# Patient Record
Sex: Female | Born: 1950 | Race: White | Hispanic: No | Marital: Married | Smoking: Never smoker
Health system: Southern US, Community
[De-identification: ages and names within clinical notes are randomized; demographics above are authoritative.]

## PROBLEM LIST (undated history)

## (undated) DIAGNOSIS — T753XXA Motion sickness, initial encounter: Secondary | ICD-10-CM

## (undated) DIAGNOSIS — H348192 Central retinal vein occlusion, unspecified eye, stable: Secondary | ICD-10-CM

## (undated) DIAGNOSIS — M1990 Unspecified osteoarthritis, unspecified site: Secondary | ICD-10-CM

## (undated) DIAGNOSIS — M75122 Complete rotator cuff tear or rupture of left shoulder, not specified as traumatic: Secondary | ICD-10-CM

## (undated) DIAGNOSIS — M8589 Other specified disorders of bone density and structure, multiple sites: Secondary | ICD-10-CM

## (undated) HISTORY — PX: ABDOMINAL HYSTERECTOMY: SHX81

## (undated) HISTORY — PX: KNEE SURGERY: SHX244

## (undated) HISTORY — PX: COLONOSCOPY: SHX174

## (undated) HISTORY — PX: ESOPHAGOGASTRODUODENOSCOPY: SHX1529

## (undated) HISTORY — PX: TOTAL VAGINAL HYSTERECTOMY: SHX2548

## (undated) HISTORY — PX: OTHER SURGICAL HISTORY: SHX169

## (undated) HISTORY — PX: EYE SURGERY: SHX253

---

## 2004-04-09 ENCOUNTER — Ambulatory Visit: Payer: Self-pay | Admitting: Unknown Physician Specialty

## 2004-11-15 ENCOUNTER — Ambulatory Visit: Payer: Self-pay | Admitting: Otolaryngology

## 2004-11-20 ENCOUNTER — Ambulatory Visit: Payer: Self-pay | Admitting: Otolaryngology

## 2005-04-16 ENCOUNTER — Ambulatory Visit: Payer: Self-pay | Admitting: Unknown Physician Specialty

## 2005-04-18 ENCOUNTER — Ambulatory Visit: Payer: Self-pay | Admitting: Unknown Physician Specialty

## 2005-07-24 ENCOUNTER — Ambulatory Visit: Payer: Self-pay | Admitting: Unknown Physician Specialty

## 2005-08-15 ENCOUNTER — Ambulatory Visit: Payer: Self-pay | Admitting: General Practice

## 2005-09-03 ENCOUNTER — Ambulatory Visit: Payer: Self-pay | Admitting: General Practice

## 2006-04-24 ENCOUNTER — Ambulatory Visit: Payer: Self-pay

## 2006-11-27 ENCOUNTER — Encounter: Payer: Self-pay | Admitting: General Practice

## 2006-12-05 ENCOUNTER — Encounter: Payer: Self-pay | Admitting: General Practice

## 2007-04-01 ENCOUNTER — Ambulatory Visit: Payer: Self-pay

## 2007-04-22 ENCOUNTER — Ambulatory Visit: Payer: Self-pay | Admitting: Anesthesiology

## 2007-04-27 ENCOUNTER — Ambulatory Visit: Payer: Self-pay | Admitting: Anesthesiology

## 2007-05-26 ENCOUNTER — Ambulatory Visit: Payer: Self-pay | Admitting: Anesthesiology

## 2007-06-03 ENCOUNTER — Ambulatory Visit: Payer: Self-pay | Admitting: Unknown Physician Specialty

## 2007-06-10 ENCOUNTER — Ambulatory Visit: Payer: Self-pay | Admitting: Unknown Physician Specialty

## 2007-07-08 ENCOUNTER — Inpatient Hospital Stay: Payer: Self-pay | Admitting: Surgery

## 2008-02-29 ENCOUNTER — Ambulatory Visit: Payer: Self-pay | Admitting: Unknown Physician Specialty

## 2008-03-01 ENCOUNTER — Ambulatory Visit: Payer: Self-pay | Admitting: Unknown Physician Specialty

## 2008-03-12 ENCOUNTER — Ambulatory Visit: Payer: Self-pay

## 2008-03-25 ENCOUNTER — Encounter: Payer: Self-pay | Admitting: General Practice

## 2008-04-05 ENCOUNTER — Encounter: Payer: Self-pay | Admitting: General Practice

## 2008-04-05 ENCOUNTER — Ambulatory Visit: Payer: Self-pay | Admitting: Unknown Physician Specialty

## 2008-04-25 ENCOUNTER — Ambulatory Visit: Payer: Self-pay | Admitting: General Practice

## 2008-04-27 ENCOUNTER — Ambulatory Visit: Payer: Self-pay | Admitting: Unknown Physician Specialty

## 2008-04-28 ENCOUNTER — Ambulatory Visit: Payer: Self-pay

## 2008-05-04 ENCOUNTER — Ambulatory Visit: Payer: Self-pay | Admitting: General Practice

## 2008-05-06 ENCOUNTER — Encounter: Payer: Self-pay | Admitting: General Practice

## 2008-11-30 ENCOUNTER — Ambulatory Visit: Payer: Self-pay | Admitting: Unknown Physician Specialty

## 2008-12-08 DIAGNOSIS — C4491 Basal cell carcinoma of skin, unspecified: Secondary | ICD-10-CM

## 2008-12-08 HISTORY — DX: Basal cell carcinoma of skin, unspecified: C44.91

## 2009-04-06 ENCOUNTER — Other Ambulatory Visit: Payer: Self-pay | Admitting: Unknown Physician Specialty

## 2009-04-09 ENCOUNTER — Ambulatory Visit: Payer: Self-pay

## 2009-05-02 ENCOUNTER — Ambulatory Visit: Payer: Self-pay

## 2009-06-28 ENCOUNTER — Ambulatory Visit: Payer: Self-pay | Admitting: Unknown Physician Specialty

## 2009-07-13 ENCOUNTER — Other Ambulatory Visit: Payer: Self-pay | Admitting: Unknown Physician Specialty

## 2009-10-18 ENCOUNTER — Ambulatory Visit: Payer: Self-pay | Admitting: Unknown Physician Specialty

## 2010-05-02 DIAGNOSIS — Z86018 Personal history of other benign neoplasm: Secondary | ICD-10-CM

## 2010-05-02 HISTORY — DX: Personal history of other benign neoplasm: Z86.018

## 2010-05-08 ENCOUNTER — Ambulatory Visit: Payer: Self-pay

## 2010-08-20 ENCOUNTER — Other Ambulatory Visit: Payer: Self-pay | Admitting: General Practice

## 2010-10-22 ENCOUNTER — Other Ambulatory Visit: Payer: Self-pay

## 2010-12-07 ENCOUNTER — Other Ambulatory Visit: Payer: Self-pay | Admitting: Internal Medicine

## 2010-12-13 ENCOUNTER — Ambulatory Visit: Payer: Self-pay | Admitting: Internal Medicine

## 2011-01-09 ENCOUNTER — Other Ambulatory Visit: Payer: Self-pay | Admitting: Physician Assistant

## 2011-01-21 ENCOUNTER — Other Ambulatory Visit: Payer: Self-pay

## 2011-01-23 ENCOUNTER — Ambulatory Visit: Payer: Self-pay | Admitting: Unknown Physician Specialty

## 2011-03-07 ENCOUNTER — Ambulatory Visit: Payer: Self-pay | Admitting: Unknown Physician Specialty

## 2011-04-18 ENCOUNTER — Ambulatory Visit: Payer: Self-pay | Admitting: Pain Medicine

## 2011-04-22 ENCOUNTER — Ambulatory Visit: Payer: Self-pay | Admitting: Pain Medicine

## 2011-04-24 ENCOUNTER — Ambulatory Visit: Payer: Self-pay | Admitting: Pain Medicine

## 2011-04-25 ENCOUNTER — Ambulatory Visit: Payer: Self-pay | Admitting: Pain Medicine

## 2011-05-08 ENCOUNTER — Ambulatory Visit: Payer: Self-pay | Admitting: Pain Medicine

## 2011-06-07 ENCOUNTER — Other Ambulatory Visit: Payer: Self-pay | Admitting: Internal Medicine

## 2011-06-07 LAB — CBC WITH DIFFERENTIAL/PLATELET
Basophil %: 0.5 %
Eosinophil #: 0.1 10*3/uL (ref 0.0–0.7)
Eosinophil %: 1.4 %
HCT: 40 % (ref 35.0–47.0)
HGB: 13.9 g/dL (ref 12.0–16.0)
Lymphocyte %: 23.2 %
MCH: 33.1 pg (ref 26.0–34.0)
MCHC: 34.7 g/dL (ref 32.0–36.0)
MCV: 95 fL (ref 80–100)
Monocyte #: 0.4 10*3/uL (ref 0.0–0.7)
Neutrophil #: 4.5 10*3/uL (ref 1.4–6.5)
Neutrophil %: 68.3 %

## 2011-06-07 LAB — T4, FREE: Free Thyroxine: 0.94 ng/dL (ref 0.76–1.46)

## 2011-06-19 ENCOUNTER — Ambulatory Visit: Payer: Self-pay | Admitting: Internal Medicine

## 2011-06-25 ENCOUNTER — Ambulatory Visit: Payer: Self-pay

## 2011-06-26 ENCOUNTER — Ambulatory Visit: Payer: Self-pay | Admitting: Internal Medicine

## 2011-07-29 ENCOUNTER — Ambulatory Visit: Payer: Self-pay | Admitting: Pain Medicine

## 2011-12-12 ENCOUNTER — Ambulatory Visit: Payer: Self-pay | Admitting: General Practice

## 2011-12-25 ENCOUNTER — Other Ambulatory Visit: Payer: Self-pay

## 2011-12-25 LAB — T4, FREE: Free Thyroxine: 0.81 ng/dL (ref 0.76–1.46)

## 2012-04-08 ENCOUNTER — Ambulatory Visit: Payer: Self-pay | Admitting: Ophthalmology

## 2012-07-15 ENCOUNTER — Ambulatory Visit: Payer: Self-pay | Admitting: Internal Medicine

## 2012-10-27 ENCOUNTER — Ambulatory Visit: Payer: Self-pay | Admitting: Internal Medicine

## 2013-07-14 ENCOUNTER — Encounter: Payer: Self-pay | Admitting: Unknown Physician Specialty

## 2013-08-04 ENCOUNTER — Encounter: Payer: Self-pay | Admitting: Unknown Physician Specialty

## 2013-09-20 ENCOUNTER — Ambulatory Visit: Payer: Self-pay | Admitting: Unknown Physician Specialty

## 2013-09-29 DIAGNOSIS — M75100 Unspecified rotator cuff tear or rupture of unspecified shoulder, not specified as traumatic: Secondary | ICD-10-CM | POA: Insufficient documentation

## 2013-10-14 ENCOUNTER — Ambulatory Visit: Payer: Self-pay | Admitting: Internal Medicine

## 2014-02-20 ENCOUNTER — Ambulatory Visit: Payer: Self-pay

## 2014-03-23 ENCOUNTER — Ambulatory Visit: Payer: Self-pay | Admitting: Ophthalmology

## 2014-05-19 ENCOUNTER — Ambulatory Visit: Payer: Self-pay | Admitting: Unknown Physician Specialty

## 2014-09-21 ENCOUNTER — Other Ambulatory Visit: Payer: Self-pay | Admitting: Family

## 2014-09-21 ENCOUNTER — Ambulatory Visit: Admission: RE | Admit: 2014-09-21 | Payer: PRIVATE HEALTH INSURANCE | Source: Ambulatory Visit | Admitting: *Deleted

## 2014-09-21 ENCOUNTER — Ambulatory Visit
Admission: RE | Admit: 2014-09-21 | Discharge: 2014-09-21 | Disposition: A | Discharge status: Home / Self Care | Payer: PRIVATE HEALTH INSURANCE | Source: Ambulatory Visit | Attending: Family | Admitting: Family

## 2014-09-21 DIAGNOSIS — Z87828 Personal history of other (healed) physical injury and trauma: Secondary | ICD-10-CM | POA: Diagnosis not present

## 2014-09-21 DIAGNOSIS — M25431 Effusion, right wrist: Secondary | ICD-10-CM | POA: Diagnosis present

## 2014-09-21 DIAGNOSIS — M25531 Pain in right wrist: Secondary | ICD-10-CM | POA: Diagnosis present

## 2014-09-21 DIAGNOSIS — M19041 Primary osteoarthritis, right hand: Secondary | ICD-10-CM | POA: Insufficient documentation

## 2014-09-21 DIAGNOSIS — R609 Edema, unspecified: Secondary | ICD-10-CM

## 2014-09-21 DIAGNOSIS — M79644 Pain in right finger(s): Secondary | ICD-10-CM | POA: Diagnosis present

## 2014-09-21 DIAGNOSIS — R52 Pain, unspecified: Secondary | ICD-10-CM

## 2014-10-26 ENCOUNTER — Other Ambulatory Visit: Payer: Self-pay | Admitting: Internal Medicine

## 2014-10-26 DIAGNOSIS — Z1231 Encounter for screening mammogram for malignant neoplasm of breast: Secondary | ICD-10-CM

## 2014-10-27 ENCOUNTER — Other Ambulatory Visit: Payer: Self-pay | Admitting: Internal Medicine

## 2014-10-27 DIAGNOSIS — M810 Age-related osteoporosis without current pathological fracture: Secondary | ICD-10-CM

## 2014-11-15 ENCOUNTER — Ambulatory Visit
Admission: RE | Admit: 2014-11-15 | Discharge: 2014-11-15 | Disposition: A | Discharge status: Home / Self Care | Payer: 59 | Source: Ambulatory Visit | Attending: Internal Medicine | Admitting: Internal Medicine

## 2014-11-15 DIAGNOSIS — Z1231 Encounter for screening mammogram for malignant neoplasm of breast: Secondary | ICD-10-CM | POA: Diagnosis not present

## 2014-11-16 ENCOUNTER — Ambulatory Visit
Admission: RE | Admit: 2014-11-16 | Discharge: 2014-11-16 | Disposition: A | Discharge status: Home / Self Care | Payer: 59 | Source: Ambulatory Visit | Attending: Internal Medicine | Admitting: Internal Medicine

## 2014-11-16 DIAGNOSIS — Z13820 Encounter for screening for osteoporosis: Secondary | ICD-10-CM | POA: Diagnosis not present

## 2014-11-16 DIAGNOSIS — M8580 Other specified disorders of bone density and structure, unspecified site: Secondary | ICD-10-CM | POA: Insufficient documentation

## 2014-11-16 DIAGNOSIS — M810 Age-related osteoporosis without current pathological fracture: Secondary | ICD-10-CM

## 2014-11-18 DIAGNOSIS — B9689 Other specified bacterial agents as the cause of diseases classified elsewhere: Secondary | ICD-10-CM | POA: Insufficient documentation

## 2014-11-18 DIAGNOSIS — N760 Acute vaginitis: Secondary | ICD-10-CM

## 2015-02-09 ENCOUNTER — Encounter: Payer: Self-pay | Admitting: Physician Assistant

## 2015-02-09 ENCOUNTER — Ambulatory Visit: Payer: Self-pay | Admitting: Physician Assistant

## 2015-02-09 ENCOUNTER — Other Ambulatory Visit: Payer: Self-pay

## 2015-02-09 VITALS — BP 130/92 | Temp 97.8°F | Ht 61.0 in | Wt 143.0 lb

## 2015-02-09 DIAGNOSIS — J302 Other seasonal allergic rhinitis: Secondary | ICD-10-CM

## 2015-02-09 DIAGNOSIS — E039 Hypothyroidism, unspecified: Secondary | ICD-10-CM | POA: Insufficient documentation

## 2015-02-09 DIAGNOSIS — M810 Age-related osteoporosis without current pathological fracture: Secondary | ICD-10-CM | POA: Insufficient documentation

## 2015-02-09 DIAGNOSIS — T7840XA Allergy, unspecified, initial encounter: Secondary | ICD-10-CM | POA: Insufficient documentation

## 2015-02-09 DIAGNOSIS — Z9109 Other allergy status, other than to drugs and biological substances: Secondary | ICD-10-CM

## 2015-02-09 MED ORDER — ALBUTEROL SULFATE HFA 108 (90 BASE) MCG/ACT IN AERS: 2.0000 | INHALATION_SPRAY | Freq: Four times a day (QID) | RESPIRATORY_TRACT | Status: DC | PRN

## 2015-02-09 MED ORDER — LEVOCETIRIZINE DIHYDROCHLORIDE 5 MG PO TABS: 5.0000 mg | ORAL_TABLET | Freq: Every evening | ORAL | Status: DC

## 2015-02-09 MED ORDER — PREDNISONE 10 MG PO TABS
30.0000 mg | ORAL_TABLET | Freq: Every day | ORAL | Status: DC
Start: 2015-02-09 — End: 2015-05-22

## 2015-02-09 NOTE — Addendum Note (Signed)
Addended by: Versie Starks on: 02/09/2015 01:48 PM   Modules accepted: Orders

## 2015-02-09 NOTE — Progress Notes (Signed)
S:  Was exposed to a fragrance at work yesterday, since then has a lot of swelling in face, burning in nose, hoarse voice, no dif breathing, no cp/sob; using allergy meds as rx'd  O: vitals wnl, nad, tms clear, nasal mucosa inflamed, throat wnl, voice hoarse, neck supple no lymph, lungs c t a, cv rrr  A: allergic reaction to fragrance  P: prednisone 30mg  qd x 3d, xyzal 5mg 

## 2015-03-02 ENCOUNTER — Ambulatory Visit: Payer: Self-pay | Admitting: Physician Assistant

## 2015-03-02 VITALS — BP 130/90 | HR 76 | Temp 97.9°F

## 2015-03-02 DIAGNOSIS — J018 Other acute sinusitis: Secondary | ICD-10-CM

## 2015-03-02 MED ORDER — METHYLPREDNISOLONE 4 MG PO TBPK: ORAL_TABLET | ORAL | Status: DC

## 2015-03-02 MED ORDER — CEFDINIR 300 MG PO CAPS: 300.0000 mg | ORAL_CAPSULE | Freq: Two times a day (BID) | ORAL | Status: DC

## 2015-03-02 NOTE — Progress Notes (Signed)
S: C/o runny nose and congestion for 3 days, pressure up middle,  no fever, chills, cp/sob, v/d; mucus is green and thick, c/o of facial and dental pain. [place], worried about pressure while going up in elevation  Using otc meds:   O: PE: vitals wnl, nad,  perrl eomi, normocephalic, tms dull, nasal mucosa red and swollen, throat injected, neck supple no lymph, lungs c t a, cv rrr, neuro intact  A:  Acute/chronic sinusitis   P: omnicef 300mg  bid x 10d, medrol dose pack,  drink fluids, continue regular meds , use otc meds of choice, return if not improving in 5 days, return earlier if worsening

## 2015-03-28 ENCOUNTER — Ambulatory Visit: Payer: Self-pay | Admitting: Physician Assistant

## 2015-03-28 ENCOUNTER — Encounter: Payer: Self-pay | Admitting: Physician Assistant

## 2015-03-28 VITALS — BP 132/94 | HR 72 | Temp 97.6°F

## 2015-03-28 DIAGNOSIS — J00 Acute nasopharyngitis [common cold]: Secondary | ICD-10-CM

## 2015-03-28 DIAGNOSIS — H6982 Other specified disorders of Eustachian tube, left ear: Secondary | ICD-10-CM

## 2015-03-28 NOTE — Progress Notes (Signed)
S:  C/o ears popping and being stopped up, no drainage from ears, no fever/chills, no cough or congestion, some sinus pressure, remainder ros neg Using otc meds without relief  O:  Vitals wnl, nad, tms dull b/l, nasal mucosa swollen, throat wnl, neck supple no lymph, lungs c t a, cv rrr, neuro intact  A: acute eustachean tube dysfunction, common cold  P: flonase, sudafed, allegra, reassurance, return if not improving in 3 to 5 days, return earlier if worsening

## 2015-04-10 ENCOUNTER — Encounter: Payer: Self-pay | Admitting: Physician Assistant

## 2015-04-10 ENCOUNTER — Ambulatory Visit: Payer: Self-pay | Admitting: Physician Assistant

## 2015-04-10 VITALS — BP 120/88 | HR 88 | Temp 97.7°F

## 2015-04-10 DIAGNOSIS — R300 Dysuria: Secondary | ICD-10-CM

## 2015-04-10 DIAGNOSIS — N390 Urinary tract infection, site not specified: Secondary | ICD-10-CM

## 2015-04-10 LAB — POCT URINALYSIS DIPSTICK
BILIRUBIN UA: NEGATIVE
Blood, UA: NEGATIVE
GLUCOSE UA: NEGATIVE
KETONES UA: NEGATIVE
Nitrite, UA: NEGATIVE
Protein, UA: NEGATIVE
SPEC GRAV UA: 1.025
Urobilinogen, UA: 0.2
pH, UA: 5.5

## 2015-04-10 MED ORDER — CIPROFLOXACIN HCL 250 MG PO TABS: 250.0000 mg | ORAL_TABLET | Freq: Two times a day (BID) | ORAL | Status: DC

## 2015-04-10 NOTE — Progress Notes (Signed)
S: C/o runny nose and congestion for 3 days, no fever, chills, cp/sob, v/d; mucus was green this am but clear throughout the day, cough is sporadic, also burning with urination, no blood noted  O: PE: vitals wnl, nad,  perrl eomi, normocephalic, tms dull, nasal mucosa red and swollen, throat injected, neck supple no lymph, lungs c t a, cv rrr, neuro intact, ua +1 leuks  A:  Acute uri, acute uti  P: cipro 250mg  bid x 7d; drink fluids, continue regular meds , use otc meds of choice, return if not improving in 5 days, return earlier if worsening

## 2015-04-24 ENCOUNTER — Encounter: Payer: Self-pay | Admitting: Physician Assistant

## 2015-04-24 ENCOUNTER — Ambulatory Visit: Payer: Self-pay | Admitting: Physician Assistant

## 2015-04-24 VITALS — BP 125/85 | HR 106 | Temp 97.6°F

## 2015-04-24 DIAGNOSIS — M25562 Pain in left knee: Secondary | ICD-10-CM

## 2015-04-24 NOTE — Progress Notes (Signed)
S: c/o left knee pain, increased pain with extension, sx for 2 weeks, no fever/chills, no redness or swelling, also nasal passage is burning, used neti pot and it burned a lot, no drainage or sinus congestion  O: vitals wnl, nad, tms clear, nasal mucosa boggy and irritated, throat wnl, neck supple no lymph, lungs c t a, cv rrr, left knee tender along lcl, full rom, no swelling noted, n/v intact  A: left knee pain, nasal mucosal irritation  P: otc nsaids, ace wrap given, f/u with ortho for knee pain, vaseline for nasal irritation

## 2015-04-26 NOTE — Progress Notes (Signed)
Contacted [employer] spoke with Finger. Appt scheduled w/Wolfe P.A  On 05/19/2015 via patient.

## 2015-05-12 DIAGNOSIS — H26491 Other secondary cataract, right eye: Secondary | ICD-10-CM | POA: Diagnosis not present

## 2015-05-19 DIAGNOSIS — S83222A Peripheral tear of medial meniscus, current injury, left knee, initial encounter: Secondary | ICD-10-CM | POA: Diagnosis not present

## 2015-05-19 DIAGNOSIS — M25562 Pain in left knee: Secondary | ICD-10-CM | POA: Diagnosis not present

## 2015-05-22 ENCOUNTER — Encounter: Payer: Self-pay | Admitting: Physician Assistant

## 2015-05-22 ENCOUNTER — Ambulatory Visit: Payer: Self-pay | Admitting: Physician Assistant

## 2015-05-22 VITALS — BP 140/110 | HR 80 | Temp 97.7°F

## 2015-05-22 DIAGNOSIS — T50905A Adverse effect of unspecified drugs, medicaments and biological substances, initial encounter: Secondary | ICD-10-CM

## 2015-05-22 NOTE — Progress Notes (Signed)
S: c/o cheeks being red and a little puffy, sx started yesterday, states had a steroid injection in her knee on Friday (3 days ago) ; no fever/chills/cp/sob  O: vitals w elevated bp, ENT wnl, neck supple no lymph, lungs c t a, cv rrr, skin, unable to see redness due to makeup  A: rash secondary to steroid injection  P: reassurance

## 2015-06-02 ENCOUNTER — Encounter: Payer: Self-pay | Admitting: Physician Assistant

## 2015-06-02 ENCOUNTER — Ambulatory Visit: Payer: Self-pay | Admitting: Physician Assistant

## 2015-06-02 VITALS — BP 160/90 | HR 104 | Temp 98.0°F

## 2015-06-02 DIAGNOSIS — J00 Acute nasopharyngitis [common cold]: Secondary | ICD-10-CM

## 2015-06-02 NOTE — Progress Notes (Signed)
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

## 2015-06-08 DIAGNOSIS — J019 Acute sinusitis, unspecified: Secondary | ICD-10-CM | POA: Diagnosis not present

## 2015-07-18 ENCOUNTER — Ambulatory Visit: Payer: Self-pay | Admitting: Physician Assistant

## 2015-07-18 ENCOUNTER — Encounter: Payer: Self-pay | Admitting: Physician Assistant

## 2015-07-18 VITALS — BP 120/90 | HR 80 | Temp 99.0°F

## 2015-07-18 DIAGNOSIS — R6883 Chills (without fever): Secondary | ICD-10-CM

## 2015-07-18 LAB — POCT URINALYSIS DIPSTICK
Bilirubin, UA: NEGATIVE
Blood, UA: NEGATIVE
Glucose, UA: NEGATIVE
Ketones, UA: NEGATIVE
LEUKOCYTES UA: NEGATIVE
NITRITE UA: NEGATIVE
PH UA: 5.5
PROTEIN UA: NEGATIVE
Spec Grav, UA: 1.01
Urobilinogen, UA: 0.2

## 2015-07-18 LAB — POCT INFLUENZA A/B
INFLUENZA B, POC: NEGATIVE
Influenza A, POC: NEGATIVE

## 2015-07-18 NOTE — Progress Notes (Signed)
S: c/o sore throat and fever, no cough or congestion, some ear pain, denies cp/sob/abd pain or dysuria; states her husband has been sick coughing for over a week, just doesn't feel right  O: vitals with slightly elevated temp at 99.0; tms dull, nasal mucosa wnl, throat wnl, neck supple no lymph, lungs c t a, cv rrr, ua neg, flu swab neg  A: viral uri  P: reassurance, recheck if worsening

## 2015-07-25 DIAGNOSIS — L820 Inflamed seborrheic keratosis: Secondary | ICD-10-CM | POA: Diagnosis not present

## 2015-07-25 DIAGNOSIS — L578 Other skin changes due to chronic exposure to nonionizing radiation: Secondary | ICD-10-CM | POA: Diagnosis not present

## 2015-07-25 DIAGNOSIS — L821 Other seborrheic keratosis: Secondary | ICD-10-CM | POA: Diagnosis not present

## 2015-07-27 ENCOUNTER — Encounter: Payer: Self-pay | Admitting: Physician Assistant

## 2015-07-27 ENCOUNTER — Ambulatory Visit: Payer: Self-pay | Admitting: Physician Assistant

## 2015-07-27 VITALS — BP 140/88 | HR 100 | Temp 99.1°F

## 2015-07-27 DIAGNOSIS — B349 Viral infection, unspecified: Secondary | ICD-10-CM

## 2015-07-27 DIAGNOSIS — R509 Fever, unspecified: Secondary | ICD-10-CM

## 2015-07-27 LAB — POCT INFLUENZA A/B
INFLUENZA A, POC: NEGATIVE
Influenza B, POC: NEGATIVE

## 2015-07-27 MED ORDER — OSELTAMIVIR PHOSPHATE 75 MG PO CAPS: 75.0000 mg | ORAL_CAPSULE | Freq: Two times a day (BID) | ORAL | Status: AC

## 2015-07-27 NOTE — Progress Notes (Signed)
S: C/o runny nose and congestion with dry cough for 2 days, sore throat and headache, body aches;  + fever, chills, denies cp/sob, v/d;    Using otc meds: none  O: PE: vitals w low grade temp, nad,  perrl eomi, normocephalic, tms dull, nasal mucosa red and swollen, throat injected, neck supple no lymph, lungs c t a, cv rrr, neuro intact, flu swab neg  A:  Acute flu like illness   P: tamiflu 75mg  bid x 5d, pt exhibits flu sx with neg flu swab; drink fluids, continue regular meds , use otc meds of choice, return if not improving in 5 days, return earlier if worsening

## 2015-08-08 ENCOUNTER — Telehealth: Payer: Self-pay | Admitting: Physician Assistant

## 2015-08-08 NOTE — Telephone Encounter (Signed)
Contacted patient informed her per Manuela Schwartz needs to be seen appt. Schedule on 08/09/2015

## 2015-08-08 NOTE — Telephone Encounter (Signed)
Tell Torin to come to clinic and let me reassess her

## 2015-08-09 ENCOUNTER — Ambulatory Visit: Payer: Self-pay | Admitting: Physician Assistant

## 2015-08-09 ENCOUNTER — Encounter: Payer: Self-pay | Admitting: Physician Assistant

## 2015-08-09 VITALS — BP 130/100 | HR 84 | Temp 97.9°F

## 2015-08-09 DIAGNOSIS — Z299 Encounter for prophylactic measures, unspecified: Secondary | ICD-10-CM

## 2015-08-09 DIAGNOSIS — R300 Dysuria: Secondary | ICD-10-CM

## 2015-08-09 DIAGNOSIS — J329 Chronic sinusitis, unspecified: Secondary | ICD-10-CM

## 2015-08-09 LAB — POCT URINALYSIS DIPSTICK
Bilirubin, UA: NEGATIVE
Glucose, UA: NEGATIVE
KETONES UA: NEGATIVE
Leukocytes, UA: NEGATIVE
Nitrite, UA: NEGATIVE
PH UA: 5.5
PROTEIN UA: NEGATIVE
RBC UA: NEGATIVE
SPEC GRAV UA: 1.01
UROBILINOGEN UA: 0.2

## 2015-08-09 MED ORDER — LEVOFLOXACIN 500 MG PO TABS: 500.0000 mg | ORAL_TABLET | Freq: Every day | ORAL | Status: DC

## 2015-08-09 MED ORDER — FLUCONAZOLE 150 MG PO TABS
ORAL_TABLET | ORAL | Status: DC
Start: 2015-08-09 — End: 2015-08-30

## 2015-08-09 NOTE — Progress Notes (Signed)
S/ seen for flu like illness 2 weeks ago , now with green pnd, cough , no fever or chills, local irritation in vaginal area and odor  , wipes burned when she gave specimen today, denies discharge or itching, dysuria or frequency  O/ alert pleasant NAD,  ENT + sinus tenderness, nasal turbinates red,boggy otherwise wnl Neck supple Heart rsr lungs clear PO2 100 % ,  ABD nontender   U/a neg A/ sinusitis   Rx levaquin 500 mg , diflucanHydration encouraged . If local gyn sxs persist will need to follow up with PCP.

## 2015-08-30 ENCOUNTER — Ambulatory Visit: Payer: Self-pay | Admitting: Physician Assistant

## 2015-08-30 ENCOUNTER — Encounter: Payer: Self-pay | Admitting: Physician Assistant

## 2015-08-30 VITALS — BP 140/90 | HR 84 | Temp 98.1°F

## 2015-08-30 DIAGNOSIS — J018 Other acute sinusitis: Secondary | ICD-10-CM

## 2015-08-30 MED ORDER — PREDNISONE 10 MG PO TABS: 30.0000 mg | ORAL_TABLET | Freq: Every day | ORAL | Status: DC

## 2015-08-30 NOTE — Progress Notes (Signed)
S: c/o runny nose, congestion, some sinus pressure, sx for a few days, denies fever/chills/body aches, cough, cp/sob, or v/d  O: vitals wnl, nad, perrl eomi, conjunctiva wnl, tms dull, nasal mucosa swollen and boggy, throat wnl, neck supple no lymph, lungs c t a, cv rrr  A: acute seasonal allergies  P: saline nasal rinse, prednisone 30mg  qd x 3d

## 2015-08-31 DIAGNOSIS — L578 Other skin changes due to chronic exposure to nonionizing radiation: Secondary | ICD-10-CM | POA: Diagnosis not present

## 2015-08-31 DIAGNOSIS — L821 Other seborrheic keratosis: Secondary | ICD-10-CM | POA: Diagnosis not present

## 2015-08-31 DIAGNOSIS — L820 Inflamed seborrheic keratosis: Secondary | ICD-10-CM | POA: Diagnosis not present

## 2015-09-12 ENCOUNTER — Ambulatory Visit: Payer: Self-pay | Admitting: Physician Assistant

## 2015-09-12 ENCOUNTER — Encounter: Payer: Self-pay | Admitting: Physician Assistant

## 2015-09-12 VITALS — BP 120/82 | HR 78 | Temp 97.8°F

## 2015-09-12 DIAGNOSIS — J018 Other acute sinusitis: Secondary | ICD-10-CM

## 2015-09-12 MED ORDER — PREDNISONE 10 MG PO TABS: 30.0000 mg | ORAL_TABLET | Freq: Every day | ORAL | Status: DC

## 2015-09-12 MED ORDER — FLUCONAZOLE 150 MG PO TABS: 150.0000 mg | ORAL_TABLET | Freq: Once | ORAL | Status: DC

## 2015-09-12 MED ORDER — AMOXICILLIN 875 MG PO TABS: 875.0000 mg | ORAL_TABLET | Freq: Two times a day (BID) | ORAL | Status: DC

## 2015-09-12 NOTE — Progress Notes (Signed)
S: C/o runny nose and congestion for 3 days, no fever, chills, cp/sob, v/d; mucus is green and thick,  c/o of facial and dental pain. States coworker is wearing fragrance again and its causing her allergies to act up and inflame her sinuses  O: PE: vitals wnl, nad, perrl eomi, normocephalic, tms dull, nasal mucosa red and swollen, throat injected, neck supple no lymph, lungs c t a, cv rrr, neuro intact  A:  Acute sinusitis   P: amoxil , prednisone 30mg  qd x 3d, diflucan, drink fluids, continue regular meds , use otc meds of choice, return if not improving in 5 days, return earlier if worsening

## 2015-10-02 DIAGNOSIS — R300 Dysuria: Secondary | ICD-10-CM | POA: Diagnosis not present

## 2015-10-02 DIAGNOSIS — H9202 Otalgia, left ear: Secondary | ICD-10-CM | POA: Diagnosis not present

## 2015-10-09 ENCOUNTER — Other Ambulatory Visit: Payer: Self-pay | Admitting: Internal Medicine

## 2015-10-09 DIAGNOSIS — Z1231 Encounter for screening mammogram for malignant neoplasm of breast: Secondary | ICD-10-CM

## 2015-10-18 ENCOUNTER — Ambulatory Visit: Payer: Self-pay | Admitting: Physician Assistant

## 2015-10-18 ENCOUNTER — Encounter: Payer: Self-pay | Admitting: Physician Assistant

## 2015-10-18 VITALS — BP 120/84 | HR 80 | Temp 97.8°F

## 2015-10-18 DIAGNOSIS — J018 Other acute sinusitis: Secondary | ICD-10-CM

## 2015-10-18 DIAGNOSIS — R300 Dysuria: Secondary | ICD-10-CM

## 2015-10-18 LAB — POCT URINALYSIS DIPSTICK
BILIRUBIN UA: NEGATIVE
Blood, UA: NEGATIVE
Glucose, UA: NEGATIVE
Ketones, UA: NEGATIVE
LEUKOCYTES UA: NEGATIVE
NITRITE UA: NEGATIVE
PH UA: 5.5
Protein, UA: NEGATIVE
Spec Grav, UA: 1.025
Urobilinogen, UA: 0.2

## 2015-10-18 MED ORDER — PREDNISONE 10 MG PO TABS
30.0000 mg | ORAL_TABLET | Freq: Every day | ORAL | Status: DC
Start: 2015-10-18 — End: 2015-11-29

## 2015-10-18 NOTE — Progress Notes (Signed)
S: C/o runny nose and congestion for 1 days, no fever, chills, cp/sob, v/d; was exposed to fragrance again today, now r side of face hurts and has some swelling.   Using otc meds:   O: PE: vitals wnl, nad, perrl eomi, normocephalic, tms dull, nasal mucosa red and swollen, throat injected, neck supple no lymph, lungs c t a, cv rrr, neuro intact  A:  Acute allergic sinusitis   P: prednisone 30mg  qd x 3d, drink fluids, continue regular meds , use otc meds of choice, return if not improving in 5 days, return earlier if worsening , if not improving with prednisone will call in an antibiotic

## 2015-10-20 DIAGNOSIS — Z0000 Encounter for general adult medical examination without abnormal findings: Secondary | ICD-10-CM | POA: Diagnosis not present

## 2015-10-20 MED ORDER — FLUCONAZOLE 150 MG PO TABS: 150.0000 mg | ORAL_TABLET | Freq: Once | ORAL | Status: DC

## 2015-10-20 MED ORDER — CEFDINIR 300 MG PO CAPS: 300.0000 mg | ORAL_CAPSULE | Freq: Two times a day (BID) | ORAL | Status: DC

## 2015-10-20 NOTE — Progress Notes (Signed)
Prednisone not helping, called in [facility]

## 2015-10-20 NOTE — Addendum Note (Signed)
Addended by: Versie Starks on: 10/20/2015 08:22 AM   Modules accepted: Orders

## 2015-10-27 DIAGNOSIS — Z1239 Encounter for other screening for malignant neoplasm of breast: Secondary | ICD-10-CM | POA: Diagnosis not present

## 2015-10-27 DIAGNOSIS — Z0001 Encounter for general adult medical examination with abnormal findings: Secondary | ICD-10-CM | POA: Diagnosis not present

## 2015-10-27 DIAGNOSIS — M810 Age-related osteoporosis without current pathological fracture: Secondary | ICD-10-CM | POA: Diagnosis not present

## 2015-10-27 DIAGNOSIS — E034 Atrophy of thyroid (acquired): Secondary | ICD-10-CM | POA: Diagnosis not present

## 2015-10-29 ENCOUNTER — Encounter: Payer: Self-pay | Admitting: Gynecology

## 2015-10-29 ENCOUNTER — Ambulatory Visit
Admission: EM | Admit: 2015-10-29 | Discharge: 2015-10-29 | Disposition: A | Discharge status: Home / Self Care | Payer: 59 | Attending: Family Medicine | Admitting: Family Medicine

## 2015-10-29 DIAGNOSIS — S50362A Insect bite (nonvenomous) of left elbow, initial encounter: Secondary | ICD-10-CM | POA: Diagnosis not present

## 2015-10-29 DIAGNOSIS — L089 Local infection of the skin and subcutaneous tissue, unspecified: Secondary | ICD-10-CM

## 2015-10-29 DIAGNOSIS — W57XXXA Bitten or stung by nonvenomous insect and other nonvenomous arthropods, initial encounter: Principal | ICD-10-CM

## 2015-10-29 MED ORDER — SULFAMETHOXAZOLE-TRIMETHOPRIM 800-160 MG PO TABS: 1.0000 | ORAL_TABLET | Freq: Two times a day (BID) | ORAL | Status: AC

## 2015-10-29 NOTE — Discharge Instructions (Signed)
Take Bactrim antibiotic twice a day as directed. Apply cool compresses to area and take Benadryl as needed for itching. Follow-up with PCP within 3 days if not improving.  Cellulitis Cellulitis is an infection of the skin and the tissue beneath it. The infected area is usually red and tender. Cellulitis occurs most often in the arms and lower legs.  CAUSES  Cellulitis is caused by bacteria that enter the skin through cracks or cuts in the skin. The most common types of bacteria that cause cellulitis are staphylococci and streptococci. SIGNS AND SYMPTOMS   Redness and warmth.  Swelling.  Tenderness or pain.  Fever. DIAGNOSIS  Your health care provider can usually determine what is wrong based on a physical exam. Blood tests may also be done. TREATMENT  Treatment usually involves taking an antibiotic medicine. HOME CARE INSTRUCTIONS   Take your antibiotic medicine as directed by your health care provider. Finish the antibiotic even if you start to feel better.  Keep the infected arm or leg elevated to reduce swelling.  Apply a warm cloth to the affected area up to 4 times per day to relieve pain.  Take medicines only as directed by your health care provider.  Keep all follow-up visits as directed by your health care provider. SEEK MEDICAL CARE IF:   You notice red streaks coming from the infected area.  Your red area gets larger or turns dark in color.  Your bone or joint underneath the infected area becomes painful after the skin has healed.  Your infection returns in the same area or another area.  You notice a swollen bump in the infected area.  You develop new symptoms.  You have a fever. SEEK IMMEDIATE MEDICAL CARE IF:   You feel very sleepy.  You develop vomiting or diarrhea.  You have a general ill feeling (malaise) with muscle aches and pains.   This information is not intended to replace advice given to you by your health care provider. Make sure you discuss  any questions you have with your health care provider.   Document Released: 01/30/2005 Document Revised: 01/11/2015 Document Reviewed: 07/08/2011 Elsevier Interactive Patient Education [employer].

## 2015-10-29 NOTE — ED Provider Notes (Signed)
CSN: [id number]     Arrival date & time 10/29/15  [id number] History   First MD Initiated Contact with Patient 10/29/15 [phone]     Chief Complaint  Patient presents with  . Joint Swelling   (Consider location/radiation/quality/duration/timing/severity/associated sxs/prior Treatment) HPI Comments: Patient presents with left elbow redness, swelling and warmth since yesterday. Was outside mowing lawn 2 days ago- may have been bitten by an insect. Took Benadryl yesterday with minimal relief. Area continues to swell and itch today. No radiation of pain. No other insect bites present.   The history is provided by the patient.    History reviewed. No pertinent past medical history. History reviewed. No pertinent past surgical history. No family history on file. Social History  Substance Use Topics  . Smoking status: Never Smoker   . Smokeless tobacco: None  . Alcohol Use: No   OB History    No data available     Review of Systems  Constitutional: Negative for fever.  Respiratory: Negative for chest tightness and wheezing.   Musculoskeletal: Positive for joint swelling.  Skin: Positive for wound.  No neuro deficits noted.   Allergies  Iodinated diagnostic agents  Home Medications   Prior to Admission medications   Medication Sig Start Date End Date Taking? Authorizing Provider  albuterol (PROVENTIL HFA;VENTOLIN HFA) 108 (90 BASE) MCG/ACT inhaler Inhale 2 puffs into the lungs every 6 (six) hours as needed for wheezing or shortness of breath. 02/09/15  Yes Versie Starks, PA-C  Azelastine HCl 0.15 % SOLN Place into the nose. Reported on 05/22/2015 10/26/14  Yes Historical Provider, MD  fexofenadine (ALLEGRA) 30 MG tablet Take 30 mg by mouth 2 (two) times daily.   Yes Historical Provider, MD  mometasone (NASONEX) 50 MCG/ACT nasal spray Place into the nose. 10/26/14  Yes Historical Provider, MD  Multiple Vitamin (MULTI-VITAMINS) TABS Take by mouth.   Yes Historical Provider, MD  amoxicillin  (AMOXIL) 875 MG tablet Take 1 tablet (875 mg total) by mouth 2 (two) times daily. Patient not taking: Reported on 10/18/2015 09/12/15   Versie Starks, PA-C  levocetirizine (XYZAL) 5 MG tablet Take 1 tablet (5 mg total) by mouth every evening. 02/09/15   Versie Starks, PA-C  predniSONE (DELTASONE) 10 MG tablet Take 3 tablets (30 mg total) by mouth daily with breakfast. 10/18/15   Versie Starks, PA-C  sulfamethoxazole-trimethoprim (BACTRIM DS,SEPTRA DS) 800-160 MG tablet Take 1 tablet by mouth 2 (two) times daily. 10/29/15 11/05/15  Katy Apo, NP  triamcinolone (NASACORT) 55 MCG/ACT AERO nasal inhaler Place into the nose.    Historical Provider, MD   Meds Ordered and Administered this Visit  Medications - No data to display  BP 149/89 mmHg  Pulse 86  Temp(Src) 98.1 F (36.7 C) (Oral)  Resp 12  Ht 5\' 3"  (1.6 m)  Wt 131 lb (59.421 kg)  BMI 23.21 kg/m2  SpO2 99% No data found.   Physical Exam  Constitutional: She is oriented to person, place, and time. She appears well-developed and well-nourished.  Cardiovascular: Normal rate, regular rhythm and normal heart sounds.   Pulmonary/Chest: Effort normal and breath sounds normal.  Musculoskeletal: Normal range of motion.  Neurological: She is alert and oriented to person, place, and time.  Skin: Skin is warm, dry and intact. Lesion noted.     Left elbow punctuated area near lateral epicondyle. Redness and swelling about the size of a quarter present. Slightly tender. No discharge.     ED Course  Procedures (including critical care time)  Labs Review Labs Reviewed - No data to display  Imaging Review No results found.   Visual Acuity Review  Right Eye Distance:   Left Eye Distance:   Bilateral Distance:    Right Eye Near:   Left Eye Near:    Bilateral Near:         MDM   1. Infected insect bite of elbow, left, initial encounter    Recommend cool compresses to area. Start Bactrim DS twice a day as directed. May  continue Benadryl as needed for itching. Follow-up in 2 to 3 days with PCP (patient's [facility]) if not improving.     Katy Apo, NP 10/29/15 [phone]

## 2015-10-29 NOTE — ED Notes (Signed)
Patient c/o left elbow swelling x yesterday. Per patient warm to the touch itching and painful.

## 2015-11-10 ENCOUNTER — Ambulatory Visit: Payer: Self-pay | Admitting: Physician Assistant

## 2015-11-10 ENCOUNTER — Encounter: Payer: Self-pay | Admitting: Physician Assistant

## 2015-11-10 DIAGNOSIS — J209 Acute bronchitis, unspecified: Secondary | ICD-10-CM

## 2015-11-10 MED ORDER — LEVOFLOXACIN 500 MG PO TABS: 500.0000 mg | ORAL_TABLET | Freq: Every day | ORAL | Status: DC

## 2015-11-10 MED ORDER — ALBUTEROL SULFATE HFA 108 (90 BASE) MCG/ACT IN AERS
2.0000 | INHALATION_SPRAY | Freq: Four times a day (QID) | RESPIRATORY_TRACT | Status: DC | PRN
Start: 2015-11-10 — End: 2017-01-27

## 2015-11-10 NOTE — Progress Notes (Signed)
S: C/o cough and congestion with wheezing and chest pain, chest is sore from coughing, +fever, chills. mucus is green, or cough is dry and hacking; keeping pt awake at night;  denies cardiac type chest pain or sob, v/d, abd pain Remainder ros neg  O: vitals wnl, nad, tms clear, throat injected, neck supple no lymph, lungs with wheezing, clears with cough, cv rrr, neuro intact  A:  Acute bronchitis   P:  rx medication: levaquin 500mg  , albuterol inhaler,  use otc meds, tylenol or motrin as needed for fever/chills, return if not better in 3 -5 days, return earlier if worsening

## 2015-11-15 ENCOUNTER — Ambulatory Visit: Payer: Self-pay | Admitting: Physician Assistant

## 2015-11-15 ENCOUNTER — Encounter: Payer: Self-pay | Admitting: Physician Assistant

## 2015-11-15 DIAGNOSIS — J209 Acute bronchitis, unspecified: Secondary | ICD-10-CM

## 2015-11-15 MED ORDER — FLUCONAZOLE 150 MG PO TABS: 150.0000 mg | ORAL_TABLET | Freq: Once | ORAL | Status: DC

## 2015-11-15 MED ORDER — GUAIFENESIN ER 600 MG PO TB12: 600.0000 mg | ORAL_TABLET | Freq: Two times a day (BID) | ORAL | Status: DC

## 2015-11-15 MED ORDER — LEVOFLOXACIN 500 MG PO TABS: 500.0000 mg | ORAL_TABLET | Freq: Every day | ORAL | Status: DC

## 2015-11-15 NOTE — Progress Notes (Signed)
   Subjective:cough/chest congestion    Patient ID: [name], female    DOB: 1950/07/07, 65 y.o.   MRN: [id number]  HPI Follow one week 2nd to URI. States feeling better but continue to have productive greenish cough.States no fever/chill, or N/V/D. Continue to feel fatigue , but again, much better than previous visit.    Review of Systems Hypothyroidism.    Objective:   Physical Exam Appears malaise. HEENT unremarkable. Neck supple, without adenopathy. Lungs with right upper/lower Rales. Heart RRR.       Assessment & Plan:Resolving URI  Will continue [place] for one week. Start Mucinex. Take Diflucan after last dosage of Levaqin. Follow up 5 days.

## 2015-11-16 ENCOUNTER — Ambulatory Visit: Payer: 59

## 2015-11-20 ENCOUNTER — Ambulatory Visit: Payer: Self-pay | Admitting: Physician Assistant

## 2015-11-20 ENCOUNTER — Encounter: Payer: Self-pay | Admitting: Physician Assistant

## 2015-11-20 VITALS — BP 102/80 | HR 80 | Temp 97.8°F

## 2015-11-20 DIAGNOSIS — J209 Acute bronchitis, unspecified: Secondary | ICD-10-CM

## 2015-11-20 NOTE — Progress Notes (Signed)
S: here for recheck.  No fever, feels better.  Still taking atb without any problems  O: TMs dull bilat, throat with mild injection, neck supple without aden, Lungs cl bilat, Heart RRR A: Acute bronchitis improved P: continue atb

## 2015-11-29 ENCOUNTER — Ambulatory Visit: Payer: Self-pay | Admitting: Physician Assistant

## 2015-11-29 ENCOUNTER — Encounter: Payer: Self-pay | Admitting: Physician Assistant

## 2015-11-29 VITALS — BP 120/90 | HR 76 | Temp 97.7°F

## 2015-11-29 DIAGNOSIS — J018 Other acute sinusitis: Secondary | ICD-10-CM

## 2015-11-29 DIAGNOSIS — M549 Dorsalgia, unspecified: Secondary | ICD-10-CM

## 2015-11-29 DIAGNOSIS — W57XXXA Bitten or stung by nonvenomous insect and other nonvenomous arthropods, initial encounter: Secondary | ICD-10-CM

## 2015-11-29 MED ORDER — CYCLOBENZAPRINE HCL 10 MG PO TABS
10.0000 mg | ORAL_TABLET | Freq: Three times a day (TID) | ORAL | 0 refills | Status: DC | PRN
Start: 2015-11-29 — End: 2015-12-26

## 2015-11-29 MED ORDER — DOXYCYCLINE MONOHYDRATE 100 MG PO CAPS: 100.0000 mg | ORAL_CAPSULE | Freq: Two times a day (BID) | ORAL | 0 refills | Status: AC

## 2015-11-29 MED ORDER — PREDNISONE 10 MG PO TABS: 30.0000 mg | ORAL_TABLET | Freq: Every day | ORAL | 0 refills | Status: DC

## 2015-11-29 NOTE — Progress Notes (Signed)
S: c/o tick bite x 2, both were on scalp, pulled ticks off, removed head, no fever/chills or rash, also some low back pain, can feel a knot in her lower back, increased pain with movement, also some sinus swelling, was exposed to fragrance again at work, some mucus production, no cough  O: vitals wnl, nad, skin on scalp with 2 healing bite areas, tms dull, nasal mucosa swollen, r maxillary area tender and swollen, throat wnl, neck supple no lymph, lungs c t a, cv rrr, lower r side of back tender and has spasm leading to si joint, n/v intact, walks without difficulty  A: tick bite, sinusitis, back pain  P: doxy, pred 30mg  qd, flexeril

## 2015-12-04 ENCOUNTER — Ambulatory Visit
Admission: RE | Admit: 2015-12-04 | Discharge: 2015-12-04 | Disposition: A | Discharge status: Home / Self Care | Payer: 59 | Source: Ambulatory Visit | Attending: Internal Medicine | Admitting: Internal Medicine

## 2015-12-04 ENCOUNTER — Encounter: Payer: Self-pay | Admitting: Radiology

## 2015-12-04 ENCOUNTER — Other Ambulatory Visit: Payer: Self-pay | Admitting: Internal Medicine

## 2015-12-04 DIAGNOSIS — Z1231 Encounter for screening mammogram for malignant neoplasm of breast: Secondary | ICD-10-CM

## 2015-12-11 DIAGNOSIS — J45991 Cough variant asthma: Secondary | ICD-10-CM | POA: Diagnosis not present

## 2015-12-11 DIAGNOSIS — R51 Headache: Secondary | ICD-10-CM | POA: Diagnosis not present

## 2015-12-18 ENCOUNTER — Other Ambulatory Visit: Payer: Self-pay | Admitting: Unknown Physician Specialty

## 2015-12-18 ENCOUNTER — Ambulatory Visit
Admission: RE | Admit: 2015-12-18 | Discharge: 2015-12-18 | Disposition: A | Discharge status: Home / Self Care | Payer: 59 | Source: Ambulatory Visit | Attending: Unknown Physician Specialty | Admitting: Unknown Physician Specialty

## 2015-12-18 DIAGNOSIS — R05 Cough: Secondary | ICD-10-CM | POA: Diagnosis not present

## 2015-12-18 DIAGNOSIS — R059 Cough, unspecified: Secondary | ICD-10-CM

## 2015-12-18 DIAGNOSIS — R0602 Shortness of breath: Secondary | ICD-10-CM | POA: Diagnosis not present

## 2015-12-19 DIAGNOSIS — Z1211 Encounter for screening for malignant neoplasm of colon: Secondary | ICD-10-CM | POA: Diagnosis not present

## 2015-12-19 DIAGNOSIS — Z01419 Encounter for gynecological examination (general) (routine) without abnormal findings: Secondary | ICD-10-CM | POA: Diagnosis not present

## 2015-12-19 DIAGNOSIS — Z1389 Encounter for screening for other disorder: Secondary | ICD-10-CM | POA: Diagnosis not present

## 2015-12-26 ENCOUNTER — Ambulatory Visit (INDEPENDENT_AMBULATORY_CARE_PROVIDER_SITE_OTHER): Payer: 59 | Admitting: Internal Medicine

## 2015-12-26 ENCOUNTER — Encounter: Payer: Self-pay | Admitting: Internal Medicine

## 2015-12-26 VITALS — BP 122/78 | HR 98 | Ht 63.0 in | Wt 135.0 lb

## 2015-12-26 DIAGNOSIS — M940 Chondrocostal junction syndrome [Tietze]: Secondary | ICD-10-CM

## 2015-12-26 MED ORDER — IBUPROFEN 800 MG PO TABS: 800.0000 mg | ORAL_TABLET | Freq: Three times a day (TID) | ORAL | 1 refills | Status: DC

## 2015-12-26 NOTE — Patient Instructions (Signed)
Take Motrin 800 mg every 8 hrs with food for 10 days Follow up in 2 weeks   Costochondritis Costochondritis, sometimes called Tietze syndrome, is a swelling and irritation (inflammation) of the tissue (cartilage) that connects your ribs with your breastbone (sternum). It causes pain in the chest and rib area. Costochondritis usually goes away on its own over time. It can take up to 6 weeks or longer to get better, especially if you are unable to limit your activities. CAUSES  Some cases of costochondritis have no known cause. Possible causes include:  Injury (trauma).  Exercise or activity such as lifting.  Severe coughing. SIGNS AND SYMPTOMS  Pain and tenderness in the chest and rib area.  Pain that gets worse when coughing or taking deep breaths.  Pain that gets worse with specific movements. DIAGNOSIS  Your health care provider will do a physical exam and ask about your symptoms. Chest X-rays or other tests may be done to rule out other problems. TREATMENT  Costochondritis usually goes away on its own over time. Your health care provider may prescribe medicine to help relieve pain. HOME CARE INSTRUCTIONS   Avoid exhausting physical activity. Try not to strain your ribs during normal activity. This would include any activities using chest, abdominal, and side muscles, especially if heavy weights are used.  Apply ice to the affected area for the first 2 days after the pain begins.  Put ice in a plastic bag.  Place a towel between your skin and the bag.  Leave the ice on for 20 minutes, 2-3 times a day.  Only take over-the-counter or prescription medicines as directed by your health care provider. SEEK MEDICAL CARE IF:  You have redness or swelling at the rib joints. These are signs of infection.  Your pain does not go away despite rest or medicine. SEEK IMMEDIATE MEDICAL CARE IF:   Your pain increases or you are very uncomfortable.  You have shortness of breath or  difficulty breathing.  You cough up blood.  You have worse chest pains, sweating, or vomiting.  You have a fever or persistent symptoms for more than 2-3 days.  You have a fever and your symptoms suddenly get worse. MAKE SURE YOU:   Understand these instructions.  Will watch your condition.  Will get help right away if you are not doing well or get worse.   This information is not intended to replace advice given to you by your health care provider. Make sure you discuss any questions you have with your health care provider.   Document Released: 01/30/2005 Document Revised: 02/10/2013 Document Reviewed: 11/24/2012 Elsevier Interactive Patient Education [employer].

## 2015-12-26 NOTE — Progress Notes (Signed)
[name]      Date: 12/26/2015,   MRN# [id number] [name] 1951/04/04 Code Status:  Code Status History    This patient does not have a recorded code status. Please follow your organizational policy for patients in this situation.     Hosp day:@LENGTHOFSTAYDAYS @ Referring MD: @ATDPROV @     PCP:      AdmissionWeight: 135 lb (61.2 kg)                 CurrentWeight: 135 lb (61.2 kg) [name] is a 65 y.o. old female seen in [place] for cough at the request of Dr. Tami Ribas.     CHIEF COMPLAINT:   cough   HISTORY OF PRESENT ILLNESS   65 yo pleasant white female seen today for cough for about 6 weeks now. Has otten a little better but still persistant Non productive cough, inability to take deep breaths In the beginning of July, patient had mild productive cough and nasal congestion, with slight SOB Patient DX with Acute Bronchitis on July 7TH, was given oral abx Patient was then dx with acute bronchitis and sinus infection on July 14th and was given second round of abx  And oral prednisone  Patient incidentally found tick on head and she was given Doxy and steroids Other symptoms includes intolerance of fragrances  Patient now with complaints of chest wall pain when she  takes a deep breath, patient has chest wall pain along Left sternal  border when she touches her chest  Patient has some difficulty with moving objects and lifting objects, has chest wall pain associated with increased upper torso movement   There are no signs of infection at this time, patient denies fevers, chills, NVD. Patient exercises daily Patient is non smoker, however was exposed to second hand smoke for 18 years   PAST MEDICAL HISTORY   History reviewed. No pertinent past medical history.  Recent h/o acute bronchtiis   SURGICAL HISTORY   Past Surgical History:  Procedure Laterality Date  . KNEE SURGERY    . TOTAL VAGINAL HYSTERECTOMY         FAMILY HISTORY   Family History  Problem Relation Age of Onset  . Lung cancer Father      SOCIAL HISTORY   Social History  Substance Use Topics  . Smoking status: Never Smoker  . Smokeless tobacco: Never Used  . Alcohol use No     MEDICATIONS    Home Medication:  Current Outpatient Rx  . Order #: [id number] Class: Normal  . Order #: [id number] Class: Historical Med  . Order #: [id number] Class: Historical Med  . Order #: [id number] Class: Historical Med  . Order #: [id number] Class: Normal    Current Medication:  Current Outpatient Prescriptions:  .  albuterol (PROVENTIL HFA;VENTOLIN HFA) 108 (90 Base) MCG/ACT inhaler, Inhale 2 puffs into the lungs every 6 (six) hours as needed for wheezing or shortness of breath., Disp: 1 Inhaler, Rfl: 0 .  fexofenadine (ALLEGRA) 30 MG tablet, Take 30 mg by mouth 2 (two) times daily., Disp: , Rfl:  .  mometasone (NASONEX) 50 MCG/ACT nasal spray, Place into the nose., Disp: , Rfl:  .  Multiple Vitamin (MULTI-VITAMINS) TABS, Take by mouth., Disp: , Rfl:  .  ibuprofen (ADVIL,MOTRIN) 800 MG tablet, Take 1 tablet (800 mg total) by mouth 3 (three) times daily. Please take with food, Disp: 30 tablet, Rfl: 1    ALLERGIES   Iodinated diagnostic agents     REVIEW OF  SYSTEMS   Review of Systems  Constitutional: Negative for chills, diaphoresis, fever, malaise/fatigue and weight loss.  HENT: Negative for congestion and hearing loss.   Eyes: Negative for blurred vision and double vision.  Respiratory: Positive for cough and shortness of breath. Negative for hemoptysis, sputum production and wheezing.   Cardiovascular: Negative for chest pain, palpitations and orthopnea.  Gastrointestinal: Negative for abdominal pain, heartburn, nausea and vomiting.  Genitourinary: Negative for dysuria and urgency.  Musculoskeletal: Negative for back pain, myalgias and neck pain.  Skin: Negative for rash.  Neurological: Negative for dizziness, tingling,  tremors, weakness and headaches.  Endo/Heme/Allergies: Does not bruise/bleed easily.  Psychiatric/Behavioral: Negative for depression, substance abuse and suicidal ideas. The patient is nervous/anxious.   All other systems reviewed and are negative.    VS: BP 122/78 (BP Location: Left Arm, Cuff Size: Normal)   Pulse 98   Ht 5\' 3"  (1.6 m)   Wt 135 lb (61.2 kg)   SpO2 99%   BMI 23.91 kg/m      PHYSICAL EXAM  Physical Exam  Constitutional: She is oriented to person, place, and time. She appears well-developed and well-nourished. No distress.  HENT:  Head: Normocephalic and atraumatic.  Mouth/Throat: No oropharyngeal exudate.  Eyes: EOM are normal. Pupils are equal, round, and reactive to light. No scleral icterus.  Neck: Normal range of motion. Neck supple.  Cardiovascular: Normal rate, regular rhythm and normal heart sounds.   No murmur heard. No pericardial rub noted  Pulmonary/Chest: No stridor. No respiratory distress. She has no wheezes.  Abdominal: Soft. Bowel sounds are normal.  Musculoskeletal: Normal range of motion. She exhibits no edema.  Severe chest wall tenderness upon palpation along left sternal border  Neurological: She is alert and oriented to person, place, and time. No cranial nerve deficit.  Skin: Skin is warm. She is not diaphoretic.  Psychiatric: She has a normal mood and affect.           IMAGING    Dg Chest 2 View  Result Date: 12/18/2015 CLINICAL DATA:  Cough.  Recent treatment for bronchitis. EXAM: CHEST  2 VIEW COMPARISON:  02/29/2008 FINDINGS: Heart and mediastinal contours are within normal limits. No focal opacities or effusions. No acute bony abnormality. IMPRESSION: No active cardiopulmonary disease. Electronically Signed   By: Rolm Baptise M.D.   On: 12/18/2015 13:20   Mm Screening Breast Tomo Bilateral  Result Date: 12/05/2015 CLINICAL DATA:  Screening. EXAM: 2D DIGITAL SCREENING BILATERAL MAMMOGRAM WITH CAD AND ADJUNCT TOMO  COMPARISON:  Previous exam(s). ACR Breast Density Category b: There are scattered areas of fibroglandular density. FINDINGS: There are no findings suspicious for malignancy. Images were processed with CAD. IMPRESSION: No mammographic evidence of malignancy. A result letter of this screening mammogram will be mailed directly to the patient. RECOMMENDATION: Screening mammogram in one year. (Code:SM-B-01Y) BI-RADS CATEGORY  1: Negative. Electronically Signed   By: Abelardo Diesel M.D.   On: 12/05/2015 10:14    CXR Images reviewed 12/26/2015 No acute findings, no effusions or opacities seen   ASSESSMENT/PLAN   65 yo pleasant white female with recent bout of acute bronchitis with signs and symptoms of Costochondritis with inability to take deep breaths which is causing her to cough  1.recommend Motrin 800 mg TID with food for 10 days 2. Follow up in 2 weeks  If symptoms persists will obtain EKG to look for pericarditis and assess with CT Chest.    I have personally obtained a history, examined the patient,  evaluated laboratory and independently reviewed imaging results, formulated the assessment and plan and placed orders.  The Patient requires high complexity decision making for assessment and support, frequent evaluation and titration of therapies, application of advanced monitoring technologies and extensive interpretation of multiple databases.    Patient satisfied with Plan of action and management. All questions answered  Corrin Parker, M.D.  [facility]  Medical Director [place] Director [facility] Cardio-Pulmonary Department

## 2016-01-01 ENCOUNTER — Encounter: Payer: Self-pay | Admitting: Physician Assistant

## 2016-01-01 ENCOUNTER — Ambulatory Visit: Payer: Self-pay | Admitting: Physician Assistant

## 2016-01-01 VITALS — BP 140/100 | HR 107 | Temp 98.4°F

## 2016-01-01 DIAGNOSIS — J0181 Other acute recurrent sinusitis: Secondary | ICD-10-CM

## 2016-01-01 MED ORDER — CEFDINIR 300 MG PO CAPS: 300.0000 mg | ORAL_CAPSULE | Freq: Two times a day (BID) | ORAL | 0 refills | Status: DC

## 2016-01-01 MED ORDER — PREDNISONE 10 MG PO TABS: 30.0000 mg | ORAL_TABLET | Freq: Every day | ORAL | 0 refills | Status: DC

## 2016-01-01 NOTE — Progress Notes (Signed)
S: C/o runny nose and congestion for 3 days, + fever, chills, denies cp/sob, v/d; mucus is green and thick, cough is sporadic, c/o of facial and dental pain.   Using otc meds:   O: PE: vitals wnl, nad, perrl eomi, normocephalic, tms dull, nasal mucosa red and swollen, throat injected, neck supple no lymph, lungs c t a, cv rrr, neuro intact  A:  Acute sinusitis   P: drink fluids, continue regular meds , use otc meds of choice, return if not improving in 5 days, return earlier if worsening , omnicef 300mg  bid x 10d, pred 30mg  qd, nasonex

## 2016-01-09 ENCOUNTER — Ambulatory Visit (INDEPENDENT_AMBULATORY_CARE_PROVIDER_SITE_OTHER): Payer: 59 | Admitting: Internal Medicine

## 2016-01-09 ENCOUNTER — Encounter: Payer: Self-pay | Admitting: Internal Medicine

## 2016-01-09 VITALS — BP 144/90 | HR 97 | Ht 63.0 in | Wt 138.4 lb

## 2016-01-09 DIAGNOSIS — M940 Chondrocostal junction syndrome [Tietze]: Secondary | ICD-10-CM | POA: Diagnosis not present

## 2016-01-09 NOTE — Progress Notes (Signed)
[facility] Consultation      Date: 01/09/2016,   MRN# [id number] ASHONTI JAKUB 1951-04-26 Code Status:  Code Status History    This patient does not have a recorded code status. Please follow your organizational policy for patients in this situation.     Hosp day:@LENGTHOFSTAYDAYS @ Referring MD: @ATDPROV @     PCP:      AdmissionWeight: 138 lb 6.4 oz (62.8 kg)                 CurrentWeight: 138 lb 6.4 oz (62.8 kg) [name] is a 65 y.o. old female seen in consultation for cough at the request of Dr. Tami Ribas.     CHIEF COMPLAINT:   Follow up costachondritis   HISTORY OF PRESENT ILLNESS   All symptoms improved, no chest pain, no cough Motrin has helped a lot, still some chest wall soreness Able to take deep breaths No signs of infection at this time    Current Outpatient Prescriptions:  .  albuterol (PROVENTIL HFA;VENTOLIN HFA) 108 (90 Base) MCG/ACT inhaler, Inhale 2 puffs into the lungs every 6 (six) hours as needed for wheezing or shortness of breath., Disp: 1 Inhaler, Rfl: 0 .  cefdinir (OMNICEF) 300 MG capsule, Take 1 capsule (300 mg total) by mouth 2 (two) times daily., Disp: 20 capsule, Rfl: 0 .  fexofenadine (ALLEGRA) 30 MG tablet, Take 30 mg by mouth 2 (two) times daily., Disp: , Rfl:  .  ibuprofen (ADVIL,MOTRIN) 800 MG tablet, Take 1 tablet (800 mg total) by mouth 3 (three) times daily. Please take with food, Disp: 30 tablet, Rfl: 1 .  mometasone (NASONEX) 50 MCG/ACT nasal spray, Place into the nose., Disp: , Rfl:  .  Multiple Vitamin (MULTI-VITAMINS) TABS, Take by mouth., Disp: , Rfl:     ALLERGIES   Iodinated diagnostic agents     REVIEW OF SYSTEMS   Review of Systems  Constitutional: Negative for chills, diaphoresis, fever, malaise/fatigue and weight loss.  HENT: Negative for congestion and hearing loss.   Respiratory: Negative for cough, hemoptysis, sputum production, shortness of breath and wheezing.   Cardiovascular:  Negative for chest pain, palpitations, orthopnea and leg swelling.  Gastrointestinal: Negative for abdominal pain, heartburn, nausea and vomiting.  Skin: Negative for rash.  Neurological: Negative for weakness and headaches.  Psychiatric/Behavioral: The patient is not nervous/anxious.   All other systems reviewed and are negative.    VS: BP (!) 144/90 (BP Location: Left Arm, Cuff Size: Normal)   Pulse 97   Ht 5\' 3"  (1.6 m)   Wt 138 lb 6.4 oz (62.8 kg)   SpO2 98%   BMI 24.52 kg/m       PHYSICAL EXAM  Physical Exam  Constitutional: She is oriented to person, place, and time. She appears well-developed and well-nourished. No distress.  HENT:  Mouth/Throat: No oropharyngeal exudate.  Eyes: No scleral icterus.  Neck: Neck supple.  Cardiovascular: Normal rate, regular rhythm and normal heart sounds.   No murmur heard. No pericardial rub noted  Pulmonary/Chest: Effort normal and breath sounds normal. No stridor. No respiratory distress. She has no wheezes.  Musculoskeletal: Normal range of motion. She exhibits no edema.  No chest wall tenderness  Neurological: She is alert and oriented to person, place, and time. No cranial nerve deficit.  Skin: Skin is warm. She is not diaphoretic.  Psychiatric: She has a normal mood and affect.       ASSESSMENT/PLAN   65 yo pleasant white female with  resolving Costachondritis  1.recommend Motrin 800 mg TID with food for another 10 days for residual soreness 2. Follow up  if needed    The Patient requires high complexity decision making for assessment and support, frequent evaluation and titration of therapies, application of advanced monitoring technologies and extensive interpretation of multiple databases.    Patient satisfied with Plan of action and management. All questions answered  Corrin Parker, M.D.  [facility]  Medical Director [place] Director [facility] Cardio-Pulmonary  Department

## 2016-01-09 NOTE — Patient Instructions (Signed)
Motrin for another 2 weeks then stop Call us if needed

## 2016-01-11 MED ORDER — IBUPROFEN 800 MG PO TABS: 800.0000 mg | ORAL_TABLET | Freq: Three times a day (TID) | ORAL | 0 refills | Status: DC | PRN

## 2016-01-11 NOTE — Addendum Note (Signed)
Addended by: Maryanna Shape A on: 01/11/2016 10:24 AM   Modules accepted: Orders

## 2016-01-12 ENCOUNTER — Institutional Professional Consult (permissible substitution): Payer: Self-pay | Admitting: Pulmonary Disease

## 2016-01-26 ENCOUNTER — Encounter: Payer: Self-pay | Admitting: Physician Assistant

## 2016-01-26 ENCOUNTER — Ambulatory Visit: Payer: Self-pay | Admitting: Family

## 2016-01-26 VITALS — BP 153/80 | HR 98 | Temp 97.3°F

## 2016-01-26 DIAGNOSIS — M533 Sacrococcygeal disorders, not elsewhere classified: Secondary | ICD-10-CM | POA: Diagnosis not present

## 2016-01-26 DIAGNOSIS — M25551 Pain in right hip: Secondary | ICD-10-CM | POA: Diagnosis not present

## 2016-01-26 DIAGNOSIS — J019 Acute sinusitis, unspecified: Secondary | ICD-10-CM

## 2016-01-26 MED ORDER — FLUCONAZOLE 150 MG PO TABS: ORAL_TABLET | ORAL | 0 refills | Status: DC

## 2016-01-26 MED ORDER — AMOXICILLIN 875 MG PO TABS: 875.0000 mg | ORAL_TABLET | Freq: Two times a day (BID) | ORAL | 0 refills | Status: DC

## 2016-01-26 NOTE — Progress Notes (Signed)
S left ear pain and pressure ,facial pain , nasal congestion , no fever , cough or body aches sxs x one day  O/ VSS alert NAD ENT L tm dull and mildy retracted nasal mucosa swollen increased thick rhinorhea and + facial tenderness ,throat mildly red neck supple , without nodes heart rsr lungs clear A/ acute rhinosinusitis P / amoxicillan , diflucan for prn, Supportive measures discussed. Follow up prn not improving

## 2016-02-09 ENCOUNTER — Encounter: Payer: Self-pay | Admitting: Physician Assistant

## 2016-02-09 ENCOUNTER — Ambulatory Visit: Payer: Self-pay | Admitting: Physician Assistant

## 2016-02-09 VITALS — BP 160/90 | HR 92 | Temp 98.4°F

## 2016-02-09 DIAGNOSIS — R21 Rash and other nonspecific skin eruption: Secondary | ICD-10-CM

## 2016-02-09 NOTE — Progress Notes (Signed)
S: c/o rash on arms, chest, back, states look like blisters and are really itchy, when she pops them they have clear liquid in them, are not painful, ?if she's had slight fever, no cough or congestion, got flu vaccine about 3 weeks ago, did travel to [place], husband does not have same rash, also was in the yard with the dogs  O vitals wnl, nad, skin with a few small scattered blisters, 2 on r upper arm, 3 on chest, 1 on back, 2 on left upper arm, none on hands or in web spaces,  no redness, areas are on both sides of the body, no drainage, no pus noted, n/v intact  A: rash  P: otc benadryl, hydrocortisone cream, tylenol if needed, if not improving by Monday return for eval

## 2016-02-12 DIAGNOSIS — T07XXXA Unspecified multiple injuries, initial encounter: Secondary | ICD-10-CM | POA: Diagnosis not present

## 2016-02-12 DIAGNOSIS — R21 Rash and other nonspecific skin eruption: Secondary | ICD-10-CM | POA: Diagnosis not present

## 2016-02-12 DIAGNOSIS — L508 Other urticaria: Secondary | ICD-10-CM | POA: Diagnosis not present

## 2016-02-19 ENCOUNTER — Ambulatory Visit: Payer: Self-pay | Admitting: Physician Assistant

## 2016-02-19 VITALS — BP 140/90 | HR 108 | Temp 98.3°F

## 2016-02-19 DIAGNOSIS — R0982 Postnasal drip: Secondary | ICD-10-CM

## 2016-02-19 MED ORDER — PREDNISONE 10 MG PO TABS: 30.0000 mg | ORAL_TABLET | Freq: Every day | ORAL | 0 refills | Status: DC

## 2016-02-19 NOTE — Progress Notes (Addendum)
S: C/o runny nose and congestion for 3 days, no fever, chills, cp/sob, v/d; throat is sore, voice is raspy, Using otc meds: allegra, nasonex,   O: PE: perrl eomi, normocephalic, tms dull, nasal mucosa red and swollen, throat injected, neck supple no lymph, lungs c t a, cv rrr, neuro intact  A:  Post nasal drip   P: drink fluids, continue regular meds , use otc meds of choice, return if not improving in 5 days, return earlier if worsening , continue meds, use pred 30mg  qd x 3d, if not better by Wednesday will consider an antibiotic  Pt states is coughing up green mucus and getting worse, called in zpack, pt has been on amoxil in august, omnicef in sept

## 2016-02-21 MED ORDER — AZITHROMYCIN 250 MG PO TABS: ORAL_TABLET | ORAL | 0 refills | Status: DC

## 2016-02-21 NOTE — Addendum Note (Signed)
Addended by: Versie Starks on: 02/21/2016 08:36 AM   Modules accepted: Orders

## 2016-02-22 ENCOUNTER — Other Ambulatory Visit: Payer: Self-pay | Admitting: Emergency Medicine

## 2016-02-22 MED ORDER — DOXYCYCLINE HYCLATE 100 MG PO TABS: 100.0000 mg | ORAL_TABLET | Freq: Two times a day (BID) | ORAL | 0 refills | Status: DC

## 2016-02-22 MED ORDER — AZITHROMYCIN 250 MG PO TABS: ORAL_TABLET | ORAL | 0 refills | Status: DC

## 2016-02-22 NOTE — Progress Notes (Signed)
Patient was in office yesterday to inform [name] that Zpack does not work for her and was asking for another antibiotic. Per [name] call into [facility] Doxy 100mg   1po bid disp #10 Spoke with [place] in pharmacy

## 2016-03-05 DIAGNOSIS — M461 Sacroiliitis, not elsewhere classified: Secondary | ICD-10-CM | POA: Diagnosis not present

## 2016-03-20 ENCOUNTER — Ambulatory Visit: Payer: Self-pay | Admitting: Physician Assistant

## 2016-03-20 ENCOUNTER — Encounter: Payer: Self-pay | Admitting: Physician Assistant

## 2016-03-20 VITALS — BP 142/90 | HR 80 | Temp 98.0°F

## 2016-03-20 DIAGNOSIS — H60392 Other infective otitis externa, left ear: Secondary | ICD-10-CM

## 2016-03-20 MED ORDER — NEOMYCIN-POLYMYXIN-HC 3.5-10000-1 OT SOLN: 3.0000 [drp] | Freq: Four times a day (QID) | OTIC | 0 refills | Status: DC

## 2016-03-20 NOTE — Progress Notes (Signed)
S: c/o left ear pain, no drainage, no injury, no sinus congestion, still having low grade temps without being sick, ?what to do  O: vitals wnl, temp is 98 but pt usually is 97; tms clear, left ear canal a little swollen, neck supple no lymph, lungs c t a, cv rrr,   A: otitis externa  P: cortisporin otic drops, suggested she see her pcp to get a cbc due to ongoing low grade temps

## 2016-04-04 DIAGNOSIS — M461 Sacroiliitis, not elsewhere classified: Secondary | ICD-10-CM | POA: Diagnosis not present

## 2016-04-05 DIAGNOSIS — H26492 Other secondary cataract, left eye: Secondary | ICD-10-CM | POA: Diagnosis not present

## 2016-04-12 ENCOUNTER — Ambulatory Visit: Payer: Self-pay | Admitting: Physician Assistant

## 2016-04-12 ENCOUNTER — Encounter: Payer: Self-pay | Admitting: Physician Assistant

## 2016-04-12 VITALS — BP 140/90 | HR 80 | Temp 98.1°F

## 2016-04-12 DIAGNOSIS — T7840XA Allergy, unspecified, initial encounter: Secondary | ICD-10-CM

## 2016-04-12 MED ORDER — PREDNISONE 10 MG PO TABS
30.0000 mg | ORAL_TABLET | Freq: Every day | ORAL | 0 refills | Status: DC
Start: 2016-04-12 — End: 2016-09-19

## 2016-04-12 NOTE — Progress Notes (Signed)
S: states someone wore strong perfume into the clinic and now her face is swollen and nasal passage is swollen shut, states had some pressure yesterday but no mucus production, has colonoscopy scheduled for Tues, ?what to take  O: vitals wnl, nad, tms dull, nasal mucosa pink and swollen, throat wnl, neck supple no lymph, lungs c t a, cv rrr  A: allergic reaction to perfume  P: prednisone 30mg  qd x 3d

## 2016-04-16 DIAGNOSIS — D127 Benign neoplasm of rectosigmoid junction: Secondary | ICD-10-CM | POA: Diagnosis not present

## 2016-04-16 DIAGNOSIS — C211 Malignant neoplasm of anal canal: Secondary | ICD-10-CM | POA: Diagnosis not present

## 2016-04-16 DIAGNOSIS — D124 Benign neoplasm of descending colon: Secondary | ICD-10-CM | POA: Diagnosis not present

## 2016-04-16 DIAGNOSIS — Z1211 Encounter for screening for malignant neoplasm of colon: Secondary | ICD-10-CM | POA: Diagnosis not present

## 2016-04-16 DIAGNOSIS — D126 Benign neoplasm of colon, unspecified: Secondary | ICD-10-CM | POA: Diagnosis not present

## 2016-04-16 DIAGNOSIS — C20 Malignant neoplasm of rectum: Secondary | ICD-10-CM | POA: Diagnosis not present

## 2016-04-16 DIAGNOSIS — D129 Benign neoplasm of anus and anal canal: Secondary | ICD-10-CM | POA: Diagnosis not present

## 2016-04-16 DIAGNOSIS — Z8371 Family history of colonic polyps: Secondary | ICD-10-CM | POA: Diagnosis not present

## 2016-04-16 DIAGNOSIS — K635 Polyp of colon: Secondary | ICD-10-CM | POA: Diagnosis not present

## 2016-05-10 ENCOUNTER — Ambulatory Visit: Payer: Self-pay | Admitting: Physician Assistant

## 2016-05-10 ENCOUNTER — Encounter: Payer: Self-pay | Admitting: Physician Assistant

## 2016-05-10 VITALS — BP 132/90 | HR 88 | Temp 97.6°F

## 2016-05-10 DIAGNOSIS — R300 Dysuria: Secondary | ICD-10-CM

## 2016-05-10 LAB — POCT URINALYSIS DIPSTICK
Bilirubin, UA: NEGATIVE
Blood, UA: NEGATIVE
Glucose, UA: NEGATIVE
Ketones, UA: NEGATIVE
Leukocytes, UA: NEGATIVE
Nitrite, UA: NEGATIVE
Protein, UA: NEGATIVE
Spec Grav, UA: 1.015
Urobilinogen, UA: 0.2
pH, UA: 5.5

## 2016-05-10 NOTE — Progress Notes (Signed)
S: c/o smelly urine, no fever/chills, no pain, also some sinus pain that started today, mucus was bloody this morning, no cough or congestion, no cp/sop  O: vitals wnl, nad, tms clear, nasal mucosa irritated, neck supple no lymph, lungs c t a, cv rrr, ua wnl  A: malodorous urine  P: drink plenty of water, if sx persist see gyn

## 2016-05-16 DIAGNOSIS — M461 Sacroiliitis, not elsewhere classified: Secondary | ICD-10-CM | POA: Diagnosis not present

## 2016-05-28 ENCOUNTER — Ambulatory Visit: Payer: Self-pay | Admitting: Physician Assistant

## 2016-05-28 VITALS — BP 120/98

## 2016-05-28 DIAGNOSIS — B9789 Other viral agents as the cause of diseases classified elsewhere: Secondary | ICD-10-CM

## 2016-05-28 DIAGNOSIS — J988 Other specified respiratory disorders: Principal | ICD-10-CM

## 2016-05-28 MED ORDER — BENZONATATE 100 MG PO CAPS: ORAL_CAPSULE | ORAL | 0 refills | Status: DC

## 2016-05-28 NOTE — Progress Notes (Signed)
S:  Green prod cough, congestion and hoarness x 1 day.  No known fever at home.  No OTC meds taken except for Airborne.  O: TMS dull, nose clear, throat without redness or drainage, Neck supple without aden.  Lungs clear bilat.  Heart RRR  A: Viral URI P: Tessalon 100mg  1-2 every 8 hours prn

## 2016-06-24 ENCOUNTER — Ambulatory Visit: Payer: Self-pay | Admitting: Physician Assistant

## 2016-06-24 ENCOUNTER — Encounter: Payer: Self-pay | Admitting: Physician Assistant

## 2016-06-24 VITALS — BP 150/90 | HR 97 | Temp 97.6°F

## 2016-06-24 DIAGNOSIS — M79672 Pain in left foot: Principal | ICD-10-CM

## 2016-06-24 DIAGNOSIS — M79671 Pain in right foot: Secondary | ICD-10-CM

## 2016-06-24 NOTE — Progress Notes (Signed)
S: c/o b/l foot pain, no known injury, noticed hard areas on both feet in the arch, hx of bunion surgery, has appt with podiatry on March 9, ?what to do between now and then  O: vitals wnl, nad, skin intact, no bruising, redness , or swelling, both feet have hard bone like areas on arches, full rom of foot, n/v intact  A: foot pain  P: f/u with podiatry, otc nsaids, epsom salt/warm water soaks

## 2016-07-12 DIAGNOSIS — M76822 Posterior tibial tendinitis, left leg: Secondary | ICD-10-CM | POA: Diagnosis not present

## 2016-07-12 DIAGNOSIS — M79672 Pain in left foot: Secondary | ICD-10-CM | POA: Diagnosis not present

## 2016-07-12 DIAGNOSIS — M898X9 Other specified disorders of bone, unspecified site: Secondary | ICD-10-CM | POA: Diagnosis not present

## 2016-08-11 ENCOUNTER — Telehealth: Payer: 59 | Admitting: Family

## 2016-08-11 DIAGNOSIS — B9689 Other specified bacterial agents as the cause of diseases classified elsewhere: Secondary | ICD-10-CM | POA: Diagnosis not present

## 2016-08-11 DIAGNOSIS — J329 Chronic sinusitis, unspecified: Secondary | ICD-10-CM

## 2016-08-11 MED ORDER — AMOXICILLIN-POT CLAVULANATE 875-125 MG PO TABS: 1.0000 | ORAL_TABLET | Freq: Two times a day (BID) | ORAL | 0 refills | Status: AC

## 2016-08-11 NOTE — Progress Notes (Signed)

## 2016-08-15 DIAGNOSIS — M76822 Posterior tibial tendinitis, left leg: Secondary | ICD-10-CM | POA: Diagnosis not present

## 2016-08-15 DIAGNOSIS — M898X9 Other specified disorders of bone, unspecified site: Secondary | ICD-10-CM | POA: Diagnosis not present

## 2016-08-30 DIAGNOSIS — M5442 Lumbago with sciatica, left side: Secondary | ICD-10-CM | POA: Diagnosis not present

## 2016-08-30 DIAGNOSIS — M1732 Unilateral post-traumatic osteoarthritis, left knee: Secondary | ICD-10-CM | POA: Diagnosis not present

## 2016-08-30 DIAGNOSIS — M25562 Pain in left knee: Secondary | ICD-10-CM | POA: Diagnosis not present

## 2016-09-06 DIAGNOSIS — M76822 Posterior tibial tendinitis, left leg: Secondary | ICD-10-CM | POA: Diagnosis not present

## 2016-09-17 ENCOUNTER — Encounter: Payer: Self-pay | Admitting: Physical Therapy

## 2016-09-17 ENCOUNTER — Ambulatory Visit: Payer: 59 | Attending: Podiatry | Admitting: Physical Therapy

## 2016-09-17 DIAGNOSIS — M79672 Pain in left foot: Secondary | ICD-10-CM | POA: Diagnosis not present

## 2016-09-17 NOTE — Therapy (Addendum)
[name] SERVICES [address] [place], [place], [address] Phone: [phone]   Fax:  [phone]  Physical Therapy Evaluation  Patient Details  Name: [name] MRN: [id number] Date of Birth: May 27, 1950 Referring Provider: Cleda Mccreedy, TODD  Encounter Date: 09/17/2016      PT End of Session - 09/17/16 1632    Visit Number 1   Number of Visits 17   Date for PT Re-Evaluation 11/12/16   Authorization Type umr   PT Start Time 0415   PT Stop Time 0455   PT Time Calculation (min) 40 min   Activity Tolerance Patient tolerated treatment well   Behavior During Therapy [facility] for tasks assessed/performed      History reviewed. No pertinent past medical history.  Past Surgical History:  Procedure Laterality Date  . KNEE SURGERY    . TOTAL VAGINAL HYSTERECTOMY      There were no vitals filed for this visit.       Subjective Assessment - 09/17/16 1621    Subjective Patient has had foot pain and now she is not having any pain because she is taking mobic.    Pertinent History Patient has had foot pain beginning feb 9th. She started with a boot for 3 weeks and no changes to her pain. She also tried a pad to take pressure off and it did not work. She tried pregnosone and it did not help. She tried the lace up boot for 3 weeks. She is having pain that is constant and ranges from 2/10- 4/10 unless she takes medicine.   How long can you sit comfortably? no limitations   How long can you stand comfortably? no limitations   How long can you walk comfortably? no limitations   Diagnostic tests x ray,    Patient Stated Goals to be pain free   Currently in Pain? No/denies   Pain Score 0-No pain            [facility] PT Assessment - 09/17/16 0001      Assessment   Medical Diagnosis Post Tibial Tendonitis   Referring Provider CLINE, TODD   Onset Date/Surgical Date 06/14/16   Hand Dominance Left   Next MD Visit 10/21/16   Prior Therapy no      Precautions   Precautions None     Restrictions   Weight Bearing Restrictions No   Other Position/Activity Restrictions no     Balance Screen   Has the patient fallen in the past 6 months No   Has the patient had a decrease in activity level because of a fear of falling?  No   Is the patient reluctant to leave their home because of a fear of falling?  No     Home Environment   Living Environment Private residence   Available Help at Discharge Family   Type of [place] to enter   Entrance Stairs-Number of Steps 5   Entrance Stairs-Rails Right   Home Layout Two level   Alternate Level Stairs-Number of Steps 12   Alternate Level Stairs-Rails Right   Home Equipment None     Prior Function   Level of Independence Independent   Vocation Full time employment   Vocation Requirements standing   Leisure walking,      Cognition   Overall Cognitive Status Within Functional Limits for tasks assessed   Attention Focused      PAIN: left pain ranges from 2/10- 4/10,  she is taking mobic and is not having any pain while she is taking this.  Palpation ; patient has tenderness during deep palpation to  Left tibialias anterior tendon,  Left posterior tibialias tendon and left long plantar ligament,   POSTURE: WNL   PROM/AROM: WFL left and right ankle/ foot  STRENGTH:  Graded on a 0-5 scale Muscle Group Left Right                          Hip Flex [facility] WFL  Hip Abd [facility] WFL  Hip Add [facility] WFL  Hip Ext [facility] WFL  Hip IR/ER [facility]  Knee Flex [facility] WFL  Knee Ext [facility] WFL  Ankle DF 5/5 5/5  Ankle PF 5/5 4/5   AROM. PROM and over pressure is [facility] Muscle length flexibility including gastrocnemius and [place] is WFL  Assessment of accessory movements:  posterior to anterior glide of talus : normal Anterior to posterior glide of talus: normal Anterior to posterior glides of the distal fibula: normal Posterior to anterior glide of distal fibula normal subtalor  joint : normal SENSATION: WNL   SPECIAL TESTS:Special tests for ankle NT due to patient not having any pain or symptoms   FUNCTIONAL MOBILITY:Normal   GAIT:Patient walks 2 miles / day and has normal gait pattern without antalgic gait pattern  OUTCOME MEASURES: TEST Outcome Interpretation  LEFS  80/80 Normal , no disability                                              PT Education - 09/17/16 1631    Education provided Yes   Education Details plan of care   Person(s) Educated Patient   Methods Explanation   Comprehension Verbalized understanding             PT Long Term Goals - 09/17/16 1909      PT LONG TERM GOAL #1   Title Patient will be independent in home exercise program to improve strength/mobility for better functional independence with ADLs   Time 8   Period Weeks   Status New     PT LONG TERM GOAL #2   Title Patient will report a worst pain of 3/10 on VAS in left foot without mobic medicine to improve tolerance with ADLs and reduced symptoms with activities   Baseline needs mobic medicine to be painfree   Time 8   Period Weeks   Status New               Plan - 09/17/16 1711    Clinical Impression Statement Patient is 66 yr old female with foot pain beginning 06/14/16. She is currently not having any pain due to taking mobic. She has no strength or ROM deifcits to left foot. She has tenderness to deep palpation to left tibialias anterior tendon,  left posterior tibialias tendon and left long plantar ligament,. She wants to participate in a 5 K walk on June 2nd and will begin PT following this event if her pain returns after she stops taking the mobic. Patient will benefit from iontophoresis with Dex to reduce the inflamation and pain and return to activities painfree.     Rehab Potential Fair   Clinical Impairments Affecting Rehab Potential This patient presents with 0, personal factors/ comorbidities. , and 1  body elements  including  body structures and functions, activity limitations and or participation restrictions: pain if she does not take mobic for inflamation. Patient's condition is stable.   PT Frequency 2x / week   PT Duration 8 weeks   PT Treatment/Interventions Other (comment);Therapeutic exercise;Therapeutic activities;Manual techniques;Cryotherapy;Electrical Stimulation;Moist Heat;Iontophoresis 4mg /ml Dexamethasone;Ultrasound  iontophoresis with dexamethasone   PT Next Visit Plan ionto wiht dex   Consulted and Agree with Plan of Care Patient      Patient will benefit from skilled therapeutic intervention in order to improve the following deficits and impairments:  Pain  Visit Diagnosis: Pain in left foot - Plan: PT plan of care cert/re-cert      G-Codes - 77/93/90 1906    Functional Assessment Tool Used (Outpatient Only) clinical judgement   Functional Limitation Mobility: Walking and moving around   Mobility: Walking and Moving Around Current Status (Z0092) At least 1 percent but less than 20 percent impaired, limited or restricted   Mobility: Walking and Moving Around Goal Status (Z3007) 0 percent impaired, limited or restricted       Problem List Patient Active Problem List   Diagnosis Date Noted  . Costochondritis 12/26/2015  . Allergic state 02/09/2015  . Adult hypothyroidism 02/09/2015  . OP (osteoporosis) 02/09/2015  . AV (anaerobic vaginosis) 11/18/2014  . Non-traumatic rotator cuff tear 09/29/2013   Alanson Puls, PT, DPT [place], [place] S 09/17/2016, 7:17 PM  [place] MAIN [facility] SERVICES [address] [place], [place], [address] Phone: [phone]   Fax:  [phone]  Name: [name] MRN: [id number] Date of Birth: Nov 08, 1950

## 2016-09-19 ENCOUNTER — Ambulatory Visit: Payer: Self-pay | Admitting: Physician Assistant

## 2016-09-19 ENCOUNTER — Encounter: Payer: Self-pay | Admitting: Physician Assistant

## 2016-09-19 VITALS — BP 130/100 | HR 100 | Temp 98.3°F

## 2016-09-19 DIAGNOSIS — T7840XA Allergy, unspecified, initial encounter: Secondary | ICD-10-CM

## 2016-09-19 MED ORDER — PREDNISONE 10 MG PO TABS: 30.0000 mg | ORAL_TABLET | Freq: Every day | ORAL | 0 refills | Status: DC

## 2016-09-19 NOTE — Progress Notes (Signed)
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

## 2016-09-22 DIAGNOSIS — J0100 Acute maxillary sinusitis, unspecified: Secondary | ICD-10-CM | POA: Diagnosis not present

## 2016-10-01 DIAGNOSIS — M1732 Unilateral post-traumatic osteoarthritis, left knee: Secondary | ICD-10-CM | POA: Diagnosis not present

## 2016-10-08 ENCOUNTER — Encounter: Payer: 59 | Admitting: Physical Therapy

## 2016-10-10 ENCOUNTER — Encounter: Payer: 59 | Admitting: Physical Therapy

## 2016-10-15 ENCOUNTER — Encounter: Payer: 59 | Admitting: Physical Therapy

## 2016-10-17 ENCOUNTER — Ambulatory Visit: Payer: 59 | Attending: Podiatry | Admitting: Physical Therapy

## 2016-10-17 ENCOUNTER — Encounter: Payer: 59 | Admitting: Physical Therapy

## 2016-10-17 DIAGNOSIS — M25562 Pain in left knee: Secondary | ICD-10-CM | POA: Diagnosis not present

## 2016-10-17 DIAGNOSIS — M79672 Pain in left foot: Secondary | ICD-10-CM | POA: Insufficient documentation

## 2016-10-17 NOTE — Therapy (Signed)
[place] MAIN [facility] SERVICES [address] [place], [place], [address] Phone: [phone]   Fax:  [phone]  Physical Therapy Re-Evaluation  Patient Details  Name: [name] MRN: [id number] Date of Birth: 11-Oct-1950 Referring Provider: Watt Climes   Encounter Date: 10/17/2016      PT End of Session - 10/17/16 1622    Visit Number 2   Number of Visits 17   Date for PT Re-Evaluation 11/12/16   Authorization Type umr   PT Start Time 0405   PT Stop Time 0500   PT Time Calculation (min) 55 min   Activity Tolerance Patient tolerated treatment well   Behavior During Therapy [facility] for tasks assessed/performed      No past medical history on file.  Past Surgical History:  Procedure Laterality Date  . KNEE SURGERY    . TOTAL VAGINAL HYSTERECTOMY      There were no vitals filed for this visit.       Subjective Assessment - 10/17/16 1614    Subjective Patient has had foot pain beginning feb 9th. She started with a boot for 3 weeks and no changes to her pain. She also tried a pad to take pressure off and it did not work. She tried pregnosone and it did not help. She tried the lace up boot for 3 weeks. She is having knee pain. She had a cortisone injection 10/01/16 . She had surgery on the left knee in 2008 and then began hurting 5 months ago. She was given mobic for the knee pain and then the foot pain began to get better. She has been off the mobic  beginning 10/05/16.  He knee pain is now hurting on the medial side of the knee.    Pertinent History Patient has had foot pain beginning feb 9th. She started with a boot for 3 weeks and no changes to her pain. She also tried a pad to take pressure off and it did not work. She tried pregnosone and it did not help. She tried the lace up boot for 3 weeks. She is having pain that is constant and ranges from 2/10- 4/10 unless she takes medicine.   How long can you sit comfortably? no limitations   How  long can you stand comfortably? no limitations   How long can you walk comfortably? no limitations   Diagnostic tests x ray,    Patient Stated Goals to be pain free   Currently in Pain? Yes   Pain Score 3    Pain Location Knee   Pain Orientation Left   Pain Descriptors / Indicators Throbbing   Pain Type Chronic pain   Pain Radiating Towards medial joint line of left knee   Pain Onset More than a month ago   Pain Frequency Constant   Aggravating Factors  sitting is worse that standing   Pain Relieving Factors steroid injection 09/30/16   Effect of Pain on Daily Activities difficult to sit   Multiple Pain Sites Yes  left foot 3/10 medial base fo 1st MT head            [facility] PT Assessment - 10/17/16 0001      Assessment   Medical Diagnosis Post Tibial Tendonitis/ left knee pain   Referring Provider Vance Peper R    Onset Date/Surgical Date 10/01/16   Hand Dominance Left   Next MD Visit 10/21/16   Prior Therapy no     Precautions   Precautions  None     Restrictions   Weight Bearing Restrictions No   Other Position/Activity Restrictions no     Balance Screen   Has the patient fallen in the past 6 months No   Has the patient had a decrease in activity level because of a fear of falling?  No   Is the patient reluctant to leave their home because of a fear of falling?  No     Home [place] residence   Living Arrangements Spouse/significant other   Available Help at Discharge Family   Type of [place] to enter   Entrance Stairs-Number of Steps 5   Entrance Stairs-Rails Right   Home Layout Two level   Alternate Level Stairs-Number of Steps 12   Alternate Level Stairs-Rails Right   Home Equipment None     Prior Function   Level of Independence Independent   Vocation Full time employment   Vocation Requirements standing   Leisure walking,      Cognition   Overall Cognitive Status Within Functional Limits for  tasks assessed   Attention Focused       PAIN: 3/10 to 5/10 pain in left knee constant  POSTURE: WNL   PROM/AROM: WNL  STRENGTH:  Graded on a 0-5 scale Muscle Group Left Right                          Hip Flex 5/5 5/5  Hip Abd 5/5 5/5  Hip Add 5/5 5/5  Hip Ext 5/5 5/5  Hip IR/ER 5/5 5/5  Knee Flex 5/5 5/5  Knee Ext 5/5 5/5  Ankle DF 5/5 5/5  Ankle PF 5/5 5/5   SENSATION: no numbness   SPECIAL TESTS: apley's test negative, McMurray's test negative, negative posterior drawer test negative varus and valgus test,    FUNCTIONAL MOBILITY: independent   BALANCE:WNL   GAIT: No deficits , patient reports pain in left knee and left 1st MT base  OUTCOME MEASURES: TEST Outcome Interpretation  LEFS 78/80 80/80 is WNL                        Treatment Manual therapy to left knee including over pressure with left knee extension followed by  full flex to left knee x 10 with 10 sec hold    Objective measurements completed on examination: See above findings.                  PT Education - 10/17/16 1622    Education provided Yes   Education Details plan of care   Person(s) Educated Patient   Methods Explanation   Comprehension Verbalized understanding             PT Long Term Goals - 10/17/16 1623      PT LONG TERM GOAL #1   Title Patient will be independent in home exercise program to improve strength/mobility for better functional independence with ADLs   Time 8   Period Weeks   Status On-going     PT LONG TERM GOAL #2   Title Patient will report a worst pain of 3/10 on VAS in left foot without mobic medicine to improve tolerance with ADLs and reduced symptoms with activities   Baseline needs mobic medicine to be painfree   Time 8   Period Weeks   Status On-going     PT LONG TERM GOAL #  3   Title Patient will report a worst pain of 1/10 on VAS in  left knee  to improve tolerance with ADLs and reduced symptoms with activities.     Baseline 5/5 left knee pain   Time 8   Period Weeks   Status New                Plan - Nov 09, 2016 1708    Clinical Impression Statement Patient is 66 yr old female with left knee pain that began 5 months ago, decreased with mobic medicine that was prescribed for her left foot pain and now the pain has returned to her left knee.. She had a steroid injection to left knee recently and now is here for PT referral . She has pain to palpation to left medial  knee. She has no strength deficits and sensation is WNL left knee. She will benefit from skilled PT to improve pain to left knee and left foot.Marland Kitchen    History and Personal Factors relevant to plan of care: This patient presents with 1, personal factors/ comorbidities current situation , and 1 body elements including body structures and functions, activity limitations and or participation restrictions including pain in left foot and left knee.. Patient's condition is, evolving   Clinical Decision Making Low   Rehab Potential Fair   Clinical Impairments Affecting Rehab Potential This patient presents with 0, personal factors/ comorbidities. , and 1  body elements including body structures and functions, activity limitations and or participation restrictions: pain if she does not take mobic for inflamation. Patient's condition is stable.   PT Frequency 2x / week   PT Duration 8 weeks   PT Treatment/Interventions Other (comment);Therapeutic exercise;Therapeutic activities;Manual techniques;Cryotherapy;Electrical Stimulation;Moist Heat;Iontophoresis 4mg /ml Dexamethasone;Ultrasound  iontophoresis with dexamethasone   PT Next Visit Plan ionto with dex   Consulted and Agree with Plan of Care Patient      Patient will benefit from skilled therapeutic intervention in order to improve the following deficits and impairments:  Pain  Visit Diagnosis: Acute pain of left knee      G-Codes - 11-09-2016 1624    Functional Assessment Tool Used (Outpatient  Only) clinical judgement   Functional Limitation Mobility: Walking and moving around   Mobility: Walking and Moving Around Current Status (O8325) At least 1 percent but less than 20 percent impaired, limited or restricted   Mobility: Walking and Moving Around Goal Status (Q9826) 0 percent impaired, limited or restricted       Problem List Patient Active Problem List   Diagnosis Date Noted  . Costochondritis 12/26/2015  . Allergic state 02/09/2015  . Adult hypothyroidism 02/09/2015  . OP (osteoporosis) 02/09/2015  . AV (anaerobic vaginosis) 11/18/2014  . Non-traumatic rotator cuff tear 09/29/2013   Alanson Puls, PT, DPT [place] 11/09/16, 5:17 PM  [place] MAIN [facility] SERVICES [address] [place], [place], [address] Phone: [phone]   Fax:  [phone]  Name: LORANN TANI MRN: [id number] Date of Birth: 09/20/1950

## 2016-10-22 ENCOUNTER — Encounter: Payer: Self-pay | Admitting: Physical Therapy

## 2016-10-22 ENCOUNTER — Ambulatory Visit: Payer: 59 | Admitting: Physical Therapy

## 2016-10-22 DIAGNOSIS — M79672 Pain in left foot: Secondary | ICD-10-CM

## 2016-10-22 DIAGNOSIS — M25562 Pain in left knee: Secondary | ICD-10-CM | POA: Diagnosis not present

## 2016-10-22 NOTE — Therapy (Signed)
[place] MAIN [facility] SERVICES [address] [place], [place], [address] Phone: [phone]   Fax:  [phone]  Physical Therapy Treatment  Patient Details  Name: [name] MRN: [id number] Date of Birth: 08-16-1950 Referring Provider: Watt Climes   Encounter Date: 10/22/2016      PT End of Session - 10/22/16 1702    Visit Number 3   Number of Visits 17   Date for PT Re-Evaluation 11/12/16   Authorization Type umr   PT Start Time 0415   PT Stop Time 0445   PT Time Calculation (min) 30 min   Activity Tolerance Patient tolerated treatment well   Behavior During Therapy [facility] for tasks assessed/performed      History reviewed. No pertinent past medical history.  Past Surgical History:  Procedure Laterality Date  . KNEE SURGERY    . TOTAL VAGINAL HYSTERECTOMY      There were no vitals filed for this visit.      Subjective Assessment - 10/22/16 1700    Subjective Patient reports that her right foot is hurting today and her knee really hurt after her last PT session.    Pertinent History Patient has had foot pain beginning feb 9th. She started with a boot for 3 weeks and no changes to her pain. She also tried a pad to take pressure off and it did not work. She tried pregnosone and it did not help. She tried the lace up boot for 3 weeks. She is having pain that is constant and ranges from 2/10- 4/10 unless she takes medicine.   How long can you sit comfortably? no limitations   How long can you stand comfortably? no limitations   How long can you walk comfortably? no limitations   Diagnostic tests x ray,    Patient Stated Goals to be pain free   Currently in Pain? Yes   Pain Score 3    Pain Descriptors / Indicators Aching;Throbbing   Pain Type Chronic pain   Pain Radiating Towards left base of MT great toe   Pain Onset More than a month ago   Pain Frequency Constant   Aggravating Factors  standing and walking   Pain Relieving  Factors nothing   Effect of Pain on Daily Activities painful to walk      Ionto patch to left base of great toe with patch stat electronic transdermal drug deleviery system with dosage 1. 0 ml                           PT Education - 10/22/16 1702    Education provided Yes   Education Details educated about iontophoresis treatment   Person(s) Educated Patient   Methods Explanation   Comprehension Verbalized understanding             PT Long Term Goals - 10/17/16 1623      PT LONG TERM GOAL #1   Title Patient will be independent in home exercise program to improve strength/mobility for better functional independence with ADLs   Time 8   Period Weeks   Status On-going     PT LONG TERM GOAL #2   Title Patient will report a worst pain of 3/10 on VAS in left foot without mobic medicine to improve tolerance with ADLs and reduced symptoms with activities   Baseline needs mobic medicine to be painfree   Time 8   Period Weeks  Status On-going     PT LONG TERM GOAL #3   Title Patient will report a worst pain of 1/10 on VAS in  left knee  to improve tolerance with ADLs and reduced symptoms with activities.    Baseline 5/5 left knee pain   Time 8   Period Weeks   Status New               Plan - 10/22/16 1703    Clinical Impression Statement Patient has left foot pain and was seen for iontophoresis with dex for decreasing foot pain. a Ionto patch stat for electronic transdermal iontophoretic drug delivery system was given. Instructions fro removal was also performed.    Rehab Potential Fair   Clinical Impairments Affecting Rehab Potential This patient presents with 0, personal factors/ comorbidities. , and 1  body elements including body structures and functions, activity limitations and or participation restrictions: pain if she does not take mobic for inflamation. Patient's condition is stable.   PT Frequency 2x / week   PT Duration 8 weeks   PT  Treatment/Interventions Other (comment);Therapeutic exercise;Therapeutic activities;Manual techniques;Cryotherapy;Electrical Stimulation;Moist Heat;Iontophoresis 4mg /ml Dexamethasone;Ultrasound  iontophoresis with dexamethasone   PT Next Visit Plan ionto with dex   Consulted and Agree with Plan of Care Patient      Patient will benefit from skilled therapeutic intervention in order to improve the following deficits and impairments:  Pain  Visit Diagnosis: Acute pain of left knee  Pain in left foot     Problem List Patient Active Problem List   Diagnosis Date Noted  . Costochondritis 12/26/2015  . Allergic state 02/09/2015  . Adult hypothyroidism 02/09/2015  . OP (osteoporosis) 02/09/2015  . AV (anaerobic vaginosis) 11/18/2014  . Non-traumatic rotator cuff tear 09/29/2013   Alanson Puls, PT, DPT [place], [place] S 10/22/2016, 5:10 PM  [place] MAIN [facility] SERVICES [address] [place], [place], [address] Phone: [phone]   Fax:  [phone]  Name: [name] MRN: [id number] Date of Birth: 25-Sep-1950

## 2016-10-24 ENCOUNTER — Ambulatory Visit: Payer: 59 | Admitting: Physical Therapy

## 2016-10-25 DIAGNOSIS — E7800 Pure hypercholesterolemia, unspecified: Secondary | ICD-10-CM | POA: Diagnosis not present

## 2016-10-25 DIAGNOSIS — Z0000 Encounter for general adult medical examination without abnormal findings: Secondary | ICD-10-CM | POA: Diagnosis not present

## 2016-10-25 DIAGNOSIS — M810 Age-related osteoporosis without current pathological fracture: Secondary | ICD-10-CM | POA: Diagnosis not present

## 2016-10-27 DIAGNOSIS — R030 Elevated blood-pressure reading, without diagnosis of hypertension: Secondary | ICD-10-CM | POA: Diagnosis not present

## 2016-10-27 DIAGNOSIS — J309 Allergic rhinitis, unspecified: Secondary | ICD-10-CM | POA: Diagnosis not present

## 2016-10-27 DIAGNOSIS — N3000 Acute cystitis without hematuria: Secondary | ICD-10-CM | POA: Diagnosis not present

## 2016-10-29 ENCOUNTER — Encounter: Payer: 59 | Admitting: Physical Therapy

## 2016-10-29 ENCOUNTER — Encounter: Payer: Self-pay | Admitting: Physical Therapy

## 2016-10-29 ENCOUNTER — Ambulatory Visit: Payer: 59 | Admitting: Physical Therapy

## 2016-10-29 DIAGNOSIS — M79672 Pain in left foot: Secondary | ICD-10-CM | POA: Diagnosis not present

## 2016-10-29 DIAGNOSIS — M25562 Pain in left knee: Secondary | ICD-10-CM | POA: Diagnosis not present

## 2016-10-29 NOTE — Therapy (Signed)
[place] MAIN [facility] SERVICES [address] [place], [place], [address] Phone: [phone]   Fax:  [phone]  Physical Therapy Treatment  Patient Details  Name: [name] MRN: [id number] Date of Birth: 1950/08/04 Referring Provider: Watt Climes   Encounter Date: 10/29/2016      PT End of Session - 10/29/16 1713    Visit Number 4   Number of Visits 17   Date for PT Re-Evaluation 11/12/16   Authorization Type umr   PT Start Time 0420   PT Stop Time 0435   PT Time Calculation (min) 15 min   Activity Tolerance Patient tolerated treatment well   Behavior During Therapy [facility] for tasks assessed/performed      History reviewed. No pertinent past medical history.  Past Surgical History:  Procedure Laterality Date  . KNEE SURGERY    . TOTAL VAGINAL HYSTERECTOMY      There were no vitals filed for this visit.      Subjective Assessment - 10/29/16 1712    Subjective Patient reports that her right foot is not hurting today .   Pertinent History Patient has had foot pain beginning feb 9th. She started with a boot for 3 weeks and no changes to her pain. She also tried a pad to take pressure off and it did not work. She tried pregnosone and it did not help. She tried the lace up boot for 3 weeks. She is having pain that is constant and ranges from 2/10- 4/10 unless she takes medicine.   How long can you sit comfortably? no limitations   How long can you stand comfortably? no limitations   How long can you walk comfortably? no limitations   Diagnostic tests x ray,    Patient Stated Goals to be pain free   Currently in Pain? No/denies   Pain Score 0-No pain   Pain Onset More than a month ago      Treatment: Ionto patch to left base of great toe with patch stat electronic transdermal drug deleviery system with dosage 1. 0 ml Instructions in removal iin 3-4 hours                            PT Education -  10/29/16 1713    Education provided Yes   Education Details plan of care   Person(s) Educated Patient   Methods Explanation   Comprehension Verbalized understanding             PT Long Term Goals - 10/17/16 1623      PT LONG TERM GOAL #1   Title Patient will be independent in home exercise program to improve strength/mobility for better functional independence with ADLs   Time 8   Period Weeks   Status On-going     PT LONG TERM GOAL #2   Title Patient will report a worst pain of 3/10 on VAS in left foot without mobic medicine to improve tolerance with ADLs and reduced symptoms with activities   Baseline needs mobic medicine to be painfree   Time 8   Period Weeks   Status On-going     PT LONG TERM GOAL #3   Title Patient will report a worst pain of 1/10 on VAS in  left knee  to improve tolerance with ADLs and reduced symptoms with activities.    Baseline 5/5 left knee pain   Time 8   Period Weeks  Status New               Plan - 10/29/16 1714    Clinical Impression Statement Patient has left foot pain and was seen for iontophoresis with dex for decreasing foot pain. a Ionto patch stat for electronic transdermal iontophoretic drug delivery system was given. Instructions fro removal was also performed.    Rehab Potential Fair   Clinical Impairments Affecting Rehab Potential This patient presents with 0, personal factors/ comorbidities. , and 1  body elements including body structures and functions, activity limitations and or participation restrictions: pain if she does not take mobic for inflamation. Patient's condition is stable.   PT Frequency 2x / week   PT Duration 8 weeks   PT Treatment/Interventions Other (comment);Therapeutic exercise;Therapeutic activities;Manual techniques;Cryotherapy;Electrical Stimulation;Moist Heat;Iontophoresis 4mg /ml Dexamethasone;Ultrasound  iontophoresis with dexamethasone   PT Next Visit Plan ionto with dex   Consulted and Agree  with Plan of Care Patient      Patient will benefit from skilled therapeutic intervention in order to improve the following deficits and impairments:  Pain  Visit Diagnosis: Pain in left foot     Problem List Patient Active Problem List   Diagnosis Date Noted  . Costochondritis 12/26/2015  . Allergic state 02/09/2015  . Adult hypothyroidism 02/09/2015  . OP (osteoporosis) 02/09/2015  . AV (anaerobic vaginosis) 11/18/2014  . Non-traumatic rotator cuff tear 09/29/2013   Alanson Puls, PT, DPT [place], [place] S 10/29/2016, 5:16 PM  [place] MAIN [facility] SERVICES [address] [place], [place], [address] Phone: [phone]   Fax:  [phone]  Name: [name] MRN: [id number] Date of Birth: 17-Nov-1950

## 2016-10-31 ENCOUNTER — Encounter: Payer: 59 | Admitting: Physical Therapy

## 2016-10-31 ENCOUNTER — Other Ambulatory Visit: Payer: Self-pay | Admitting: Internal Medicine

## 2016-10-31 DIAGNOSIS — Z1231 Encounter for screening mammogram for malignant neoplasm of breast: Secondary | ICD-10-CM

## 2016-10-31 DIAGNOSIS — M76822 Posterior tibial tendinitis, left leg: Secondary | ICD-10-CM | POA: Diagnosis not present

## 2016-11-01 DIAGNOSIS — Z780 Asymptomatic menopausal state: Secondary | ICD-10-CM | POA: Diagnosis not present

## 2016-11-01 DIAGNOSIS — Z0000 Encounter for general adult medical examination without abnormal findings: Secondary | ICD-10-CM | POA: Diagnosis not present

## 2016-11-01 DIAGNOSIS — M810 Age-related osteoporosis without current pathological fracture: Secondary | ICD-10-CM | POA: Diagnosis not present

## 2016-11-01 DIAGNOSIS — E034 Atrophy of thyroid (acquired): Secondary | ICD-10-CM | POA: Diagnosis not present

## 2016-11-05 ENCOUNTER — Ambulatory Visit: Payer: 59 | Attending: Podiatry | Admitting: Physical Therapy

## 2016-11-05 ENCOUNTER — Encounter: Payer: Self-pay | Admitting: Physical Therapy

## 2016-11-05 ENCOUNTER — Encounter: Payer: 59 | Admitting: Physical Therapy

## 2016-11-05 DIAGNOSIS — M25562 Pain in left knee: Secondary | ICD-10-CM | POA: Insufficient documentation

## 2016-11-05 DIAGNOSIS — M79672 Pain in left foot: Secondary | ICD-10-CM | POA: Insufficient documentation

## 2016-11-05 NOTE — Therapy (Signed)
[place] MAIN [facility] SERVICES [address] [place], [place], [address] Phone: [phone]   Fax:  [phone]  Physical Therapy Treatment  Patient Details  Name: [name] MRN: [id number] Date of Birth: June 19, 1950 Referring Provider: Watt Climes   Encounter Date: 11/05/2016      PT End of Session - 11/05/16 0859    Visit Number 5   Number of Visits 17   Date for PT Re-Evaluation 11/12/16   Authorization Type umr   PT Start Time 0835   PT Stop Time 0845   PT Time Calculation (min) 10 min   Activity Tolerance Patient tolerated treatment well   Behavior During Therapy [facility] for tasks assessed/performed      History reviewed. No pertinent past medical history.  Past Surgical History:  Procedure Laterality Date  . KNEE SURGERY    . TOTAL VAGINAL HYSTERECTOMY      There were no vitals filed for this visit.      Subjective Assessment - 11/05/16 0854    Subjective Patient reports that her right foot is 1/10 pain today and knee is not in any pain   Pertinent History Patient has had foot pain beginning feb 9th. She started with a boot for 3 weeks and no changes to her pain. She also tried a pad to take pressure off and it did not work. She tried pregnosone and it did not help. She tried the lace up boot for 3 weeks. She is having pain that is constant and ranges from 2/10- 4/10 unless she takes medicine.   How long can you sit comfortably? no limitations   How long can you stand comfortably? no limitations   How long can you walk comfortably? no limitations   Diagnostic tests x ray,    Patient Stated Goals to be pain free   Currently in Pain? Yes   Pain Score 1    Pain Location Foot   Pain Orientation Left   Pain Descriptors / Indicators Aching   Pain Type Chronic pain   Pain Radiating Towards left base of MT great toe   Pain Onset More than a month ago   Pain Frequency Constant   Aggravating Factors  standing and walking   Pain Relieving Factors nothing   Effect of Pain on Daily Activities painful to walk   Multiple Pain Sites No          Treatment: Ionto patch to left base of great toe with patch stat electronic transdermal drug deleviery system with dosage 1. 0 ml Instructions in removal iin 3-4 hours                         PT Education - 11/05/16 0858    Education provided Yes   Education Details Removing patch within 4 hours   Person(s) Educated Patient   Methods Explanation   Comprehension Verbalized understanding             PT Long Term Goals - 10/17/16 1623      PT LONG TERM GOAL #1   Title Patient will be independent in home exercise program to improve strength/mobility for better functional independence with ADLs   Time 8   Period Weeks   Status On-going     PT LONG TERM GOAL #2   Title Patient will report a worst pain of 3/10 on VAS in left foot without mobic medicine to improve tolerance with ADLs and reduced  symptoms with activities   Baseline needs mobic medicine to be painfree   Time 8   Period Weeks   Status On-going     PT LONG TERM GOAL #3   Title Patient will report a worst pain of 1/10 on VAS in  left knee  to improve tolerance with ADLs and reduced symptoms with activities.    Baseline 5/5 left knee pain   Time 8   Period Weeks   Status New               Plan - 11/05/16 0900    Clinical Impression Statement Patient has left foot pain and was seen for iontophoresis with dex for decreasing foot pain. a Ionto patch stat for electronic transdermal iontophoretic drug delivery system was given. Instructions for removal was also performed.    Rehab Potential Fair   Clinical Impairments Affecting Rehab Potential This patient presents with 0, personal factors/ comorbidities. , and 1  body elements including body structures and functions, activity limitations and or participation restrictions: pain if she does not take mobic for inflamation.  Patient's condition is stable.   PT Frequency 2x / week   PT Duration 8 weeks   PT Treatment/Interventions Other (comment);Therapeutic exercise;Therapeutic activities;Manual techniques;Cryotherapy;Electrical Stimulation;Moist Heat;Iontophoresis 4mg /ml Dexamethasone;Ultrasound  iontophoresis with dexamethasone   PT Next Visit Plan ionto with dex   Consulted and Agree with Plan of Care Patient      Patient will benefit from skilled therapeutic intervention in order to improve the following deficits and impairments:  Pain  Visit Diagnosis: Pain in left foot  Acute pain of left knee     Problem List Patient Active Problem List   Diagnosis Date Noted  . Costochondritis 12/26/2015  . Allergic state 02/09/2015  . Adult hypothyroidism 02/09/2015  . OP (osteoporosis) 02/09/2015  . AV (anaerobic vaginosis) 11/18/2014  . Non-traumatic rotator cuff tear 09/29/2013   Alanson Puls, PT, DPT [place], [place] S 11/05/2016, 9:05 AM  [place] MAIN [facility] SERVICES [address] [place], [place], [address] Phone: [phone]   Fax:  [phone]  Name: [name] MRN: [id number] Date of Birth: 1951-03-27

## 2016-11-07 ENCOUNTER — Ambulatory Visit: Payer: 59 | Admitting: Physical Therapy

## 2016-11-07 ENCOUNTER — Encounter: Payer: Self-pay | Admitting: Physical Therapy

## 2016-11-07 DIAGNOSIS — M79672 Pain in left foot: Secondary | ICD-10-CM

## 2016-11-07 DIAGNOSIS — M25562 Pain in left knee: Secondary | ICD-10-CM | POA: Diagnosis not present

## 2016-11-07 NOTE — Therapy (Signed)
[place] MAIN [facility] SERVICES [address] [place], [place], [address] Phone: [phone]   Fax:  [phone]  Physical Therapy Evaluation  Patient Details  Name: [name] MRN: [id number] Date of Birth: [birth year] Referring Provider: Watt Climes   Encounter Date: 11/07/2016      PT End of Session - 11/07/16 1707    Visit Number 6   Number of Visits 17   Date for PT Re-Evaluation 11/12/16   Authorization Type umr   PT Start Time 1615   PT Stop Time 1625   PT Time Calculation (min) 10 min   Activity Tolerance Patient tolerated treatment well   Behavior During Therapy [facility] for tasks assessed/performed      History reviewed. No pertinent past medical history.  Past Surgical History:  Procedure Laterality Date  . KNEE SURGERY    . TOTAL VAGINAL HYSTERECTOMY      There were no vitals filed for this visit.       Subjective Assessment - 11/07/16 1637    Subjective Patient reports that her right foot is 1/10 pain today and knee is not in any pain.     Pertinent History Patient has had foot pain beginning feb 9th. She started with a boot for 3 weeks and no changes to her pain. She also tried a pad to take pressure off and it did not work. She tried pregnosone and it did not help. She tried the lace up boot for 3 weeks. She is having pain that is constant and ranges from 2/10- 4/10 unless she takes medicine.   How long can you sit comfortably? no limitations   How long can you stand comfortably? no limitations   How long can you walk comfortably? no limitations   Diagnostic tests x ray,    Patient Stated Goals to be pain free   Currently in Pain? Yes   Pain Score 1    Pain Location Foot   Pain Orientation Left   Pain Descriptors / Indicators Aching   Pain Type Chronic pain   Pain Radiating Towards left base of MT great toe   Pain Onset More than a month ago   Pain Frequency Constant   Aggravating Factors  standing and walking    Pain Relieving Factors ionto and rest   Effect of Pain on Daily Activities painful to walk   Multiple Pain Sites No        Treatment: Iontophoresis applied to base of 1st MT and patient verbally understood proper patch removal after 4 hours.        Objective measurements completed on examination: See above findings.                  PT Education - 11/07/16 1706    Education Details Removing patch within 4 hours   Person(s) Educated Patient   Methods Explanation   Comprehension Verbalized understanding             PT Long Term Goals - 10/17/16 1623      PT LONG TERM GOAL #1   Title Patient will be independent in home exercise program to improve strength/mobility for better functional independence with ADLs   Time 8   Period Weeks   Status On-going     PT LONG TERM GOAL #2   Title Patient will report a worst pain of 3/10 on VAS in left foot without mobic medicine to improve tolerance with ADLs and reduced symptoms with activities  Baseline needs mobic medicine to be painfree   Time 8   Period Weeks   Status On-going     PT LONG TERM GOAL #3   Title Patient will report a worst pain of 1/10 on VAS in  left knee  to improve tolerance with ADLs and reduced symptoms with activities.    Baseline 5/5 left knee pain   Time 8   Period Weeks   Status New                Plan - 11/07/16 1707    Clinical Impression Statement Patient has pain in the base of 1st MT that decreases once iontophoresis is applied.  Patient demonstrates minimal to no antalgic gait. Patient received instructions for proper removal of patch.   Clinical Presentation Stable   Clinical Decision Making Low   Rehab Potential Fair   Clinical Impairments Affecting Rehab Potential This patient presents with 0, personal factors/ comorbidities. , and 1  body elements including body structures and functions, activity limitations and or participation restrictions: pain if she does  not take mobic for inflamation. Patient's condition is stable.   PT Frequency 2x / week   PT Duration 8 weeks   PT Treatment/Interventions Other (comment);Therapeutic exercise;Therapeutic activities;Manual techniques;Cryotherapy;Electrical Stimulation;Moist Heat;Iontophoresis 4mg /ml Dexamethasone;Ultrasound  iontophoresis with dexamethasone   PT Next Visit Plan ionto with dex   Consulted and Agree with Plan of Care Patient      Patient will benefit from skilled therapeutic intervention in order to improve the following deficits and impairments:  Pain  Visit Diagnosis: Pain in left foot     Problem List Patient Active Problem List   Diagnosis Date Noted  . Costochondritis 12/26/2015  . Allergic state 02/09/2015  . Adult hypothyroidism 02/09/2015  . OP (osteoporosis) 02/09/2015  . AV (anaerobic vaginosis) 11/18/2014  . Non-traumatic rotator cuff tear 09/29/2013   Sheliah Plane SPT  This entire session was performed under direct supervision and direction of a licensed therapist/therapist assistant . I have personally read, edited and approve of the note as written. [address] [place], DPT 11/07/2016, 6:03 PM  [place] MAIN [facility] SERVICES [address] [place], [place], [address] Phone: [phone]   Fax:  [phone]  Name: [name] MRN: [id number] Date of Birth: May 16, 1950

## 2016-11-11 ENCOUNTER — Other Ambulatory Visit: Payer: Self-pay | Admitting: Internal Medicine

## 2016-11-11 DIAGNOSIS — Z780 Asymptomatic menopausal state: Secondary | ICD-10-CM

## 2016-11-12 ENCOUNTER — Encounter: Payer: Self-pay | Admitting: Physical Therapy

## 2016-11-12 ENCOUNTER — Encounter: Payer: 59 | Admitting: Physical Therapy

## 2016-11-12 ENCOUNTER — Ambulatory Visit: Payer: 59 | Admitting: Physical Therapy

## 2016-11-12 DIAGNOSIS — M25562 Pain in left knee: Secondary | ICD-10-CM

## 2016-11-12 DIAGNOSIS — M79672 Pain in left foot: Secondary | ICD-10-CM | POA: Diagnosis not present

## 2016-11-12 NOTE — Therapy (Signed)
[place] MAIN [facility] SERVICES [address] [place], [place], [address] Phone: [phone]   Fax:  [phone]  Physical Therapy Treatment  Patient Details  Name: [name] MRN: [id number] Date of Birth: 04-Dec-1950 Referring Provider: Watt Climes   Encounter Date: 11/12/2016      PT End of Session - 11/12/16 1324    Visit Number 7   Number of Visits 17   Date for PT Re-Evaluation 11/12/16   Authorization Type umr   PT Start Time 0105   PT Stop Time 0116   PT Time Calculation (min) 11 min   Activity Tolerance Patient tolerated treatment well   Behavior During Therapy [facility] for tasks assessed/performed      History reviewed. No pertinent past medical history.  Past Surgical History:  Procedure Laterality Date  . KNEE SURGERY    . TOTAL VAGINAL HYSTERECTOMY      There were no vitals filed for this visit.      Subjective Assessment - 11/12/16 1323    Subjective Patient reports that her right foot is 1/10 pain today and knee is not in any pain.     Pertinent History Patient has had foot pain beginning feb 9th. She started with a boot for 3 weeks and no changes to her pain. She also tried a pad to take pressure off and it did not work. She tried pregnosone and it did not help. She tried the lace up boot for 3 weeks. She is having pain that is constant and ranges from 2/10- 4/10 unless she takes medicine.   How long can you sit comfortably? no limitations   How long can you stand comfortably? no limitations   How long can you walk comfortably? no limitations   Diagnostic tests x ray,    Patient Stated Goals to be pain free   Currently in Pain? Yes   Pain Score 1    Pain Location Ankle   Pain Orientation Left   Pain Descriptors / Indicators Aching   Pain Type Chronic pain   Pain Radiating Towards left base of MT great toe   Pain Onset More than a month ago   Pain Frequency Constant   Aggravating Factors  walking   Pain  Relieving Factors ionto and rest   Effect of Pain on Daily Activities pain ful to walk   Multiple Pain Sites No       Ionto patch to left base of great toe with patch stat electronic transdermal drug deleviery system with dosage 1. 0 ml  Instructed in patch removal after 4 hours                           PT Education - 11/12/16 1324    Education provided Yes   Education Details removing patch in 4 hours   Person(s) Educated Patient   Methods Explanation   Comprehension Verbalized understanding             PT Long Term Goals - 10/17/16 1623      PT LONG TERM GOAL #1   Title Patient will be independent in home exercise program to improve strength/mobility for better functional independence with ADLs   Time 8   Period Weeks   Status On-going     PT LONG TERM GOAL #2   Title Patient will report a worst pain of 3/10 on VAS in left foot without mobic medicine to improve tolerance  with ADLs and reduced symptoms with activities   Baseline needs mobic medicine to be painfree   Time 8   Period Weeks   Status On-going     PT LONG TERM GOAL #3   Title Patient will report a worst pain of 1/10 on VAS in  left knee  to improve tolerance with ADLs and reduced symptoms with activities.    Baseline 5/5 left knee pain   Time 8   Period Weeks   Status New               Plan - 11/12/16 1325    Clinical Impression Statement Patient has pain in the base of 1st MT that decreases once iontophoresis is applied. Patient demonstrates minimal to no antalgic gait. Patient received instructions for proper removal of patch.   Rehab Potential Fair   Clinical Impairments Affecting Rehab Potential This patient presents with 0, personal factors/ comorbidities. , and 1  body elements including body structures and functions, activity limitations and or participation restrictions: pain if she does not take mobic for inflamation. Patient's condition is stable.   PT Frequency  2x / week   PT Duration 8 weeks   PT Treatment/Interventions Other (comment);Therapeutic exercise;Therapeutic activities;Manual techniques;Cryotherapy;Electrical Stimulation;Moist Heat;Iontophoresis 4mg /ml Dexamethasone;Ultrasound  iontophoresis with dexamethasone   PT Next Visit Plan ionto with dex   Consulted and Agree with Plan of Care Patient      Patient will benefit from skilled therapeutic intervention in order to improve the following deficits and impairments:  Pain  Visit Diagnosis: Pain in left foot  Acute pain of left knee     Problem List Patient Active Problem List   Diagnosis Date Noted  . Costochondritis 12/26/2015  . Allergic state 02/09/2015  . Adult hypothyroidism 02/09/2015  . OP (osteoporosis) 02/09/2015  . AV (anaerobic vaginosis) 11/18/2014  . Non-traumatic rotator cuff tear 09/29/2013    Alanson Puls, [place] DPT 11/12/2016, 1:26 PM  [place] MAIN [facility] SERVICES [address] [place], [place], [address] Phone: [phone]   Fax:  [phone]  Name: [name] MRN: [id number] Date of Birth: Apr 17, 1951

## 2016-11-14 ENCOUNTER — Ambulatory Visit: Payer: 59 | Admitting: Physical Therapy

## 2016-11-18 ENCOUNTER — Ambulatory Visit: Payer: 59

## 2016-11-18 DIAGNOSIS — M79672 Pain in left foot: Secondary | ICD-10-CM | POA: Diagnosis not present

## 2016-11-18 DIAGNOSIS — M25562 Pain in left knee: Secondary | ICD-10-CM | POA: Diagnosis not present

## 2016-11-18 NOTE — Therapy (Signed)
[place] MAIN [facility] SERVICES [address] [place], [place], [address] Phone: [phone]   Fax:  [phone]  Physical Therapy Treatment  Patient Details  Name: [name] MRN: [id number] Date of Birth: 1950-06-15 Referring Provider: Watt Climes   Encounter Date: 11/18/2016      PT End of Session - 11/18/16 1650    Visit Number 8   Number of Visits 17   Date for PT Re-Evaluation 12/12/16   Authorization Type umr   PT Start Time 1620   PT Stop Time 1635   PT Time Calculation (min) 15 min   Activity Tolerance Patient tolerated treatment well   Behavior During Therapy [facility] for tasks assessed/performed      History reviewed. No pertinent past medical history.  Past Surgical History:  Procedure Laterality Date  . KNEE SURGERY    . TOTAL VAGINAL HYSTERECTOMY      There were no vitals filed for this visit.      Subjective Assessment - 11/18/16 1649    Subjective Patient reports that her right foot is 1/10 pain today and knee is not in any pain. She is here because she would like an ionto patch applied with dexamethasone. No specific questions or concerns at this time.    Pertinent History Patient has had foot pain beginning feb 9th. She started with a boot for 3 weeks and no changes to her pain. She also tried a pad to take pressure off and it did not work. She tried pregnosone and it did not help. She tried the lace up boot for 3 weeks. She is having pain that is constant and ranges from 2/10- 4/10 unless she takes medicine.   How long can you sit comfortably? no limitations   How long can you stand comfortably? no limitations   How long can you walk comfortably? no limitations   Diagnostic tests x ray,    Patient Stated Goals to be pain free   Currently in Pain? Yes   Pain Score 1    Pain Location Foot   Pain Orientation Left   Pain Descriptors / Indicators Aching   Pain Type Chronic pain   Pain Onset --   Pain Frequency  Constant           TREATMENT  Ionto patch to left base of great toe with electrostat transdermal drug delivery system with dosage 1.0 ml(4mg /mL). Instructed in patch removal after 4 hours but can leave on up to 6 hours. Discussed shoe wear, padding inside shoes, activity modification, and other modalities that could be beneficial such as soft tissue mobilization;                       PT Education - 11/18/16 1649    Education provided Yes   Education Details handout for into patch provided. Can leave patch on up to 6 hours   Person(s) Educated Patient   Methods Explanation;Handout   Comprehension Verbalized understanding             PT Long Term Goals - 10/17/16 1623      PT LONG TERM GOAL #1   Title Patient will be independent in home exercise program to improve strength/mobility for better functional independence with ADLs   Time 8   Period Weeks   Status On-going     PT LONG TERM GOAL #2   Title Patient will report a worst pain of 3/10 on VAS in left  foot without mobic medicine to improve tolerance with ADLs and reduced symptoms with activities   Baseline needs mobic medicine to be painfree   Time 8   Period Weeks   Status On-going     PT LONG TERM GOAL #3   Title Patient will report a worst pain of 1/10 on VAS in  left knee  to improve tolerance with ADLs and reduced symptoms with activities.    Baseline 5/5 left knee pain   Time 8   Period Weeks   Status New               Plan - 11/18/16 1654    Clinical Impression Statement Pt with pain at head of her first metatarsal that has been improving with inotphoresis. Discussed other treatments but pt just wants iontophoresis applied ot her foot on this date. Applied ionto patch with 1.19mL of dexamethasone to first metatarsal head of L foot on this date. Encouraged pt to follow-up as scheduled.    Clinical Presentation Stable   Clinical Decision Making Low   Rehab Potential Fair    Clinical Impairments Affecting Rehab Potential This patient presents with 0, personal factors/ comorbidities. , and 1  body elements including body structures and functions, activity limitations and or participation restrictions: pain if she does not take mobic for inflamation. Patient's condition is stable.   PT Frequency 2x / week   PT Duration 8 weeks   PT Treatment/Interventions Other (comment);Therapeutic exercise;Therapeutic activities;Manual techniques;Cryotherapy;Electrical Stimulation;Moist Heat;Iontophoresis 4mg /ml Dexamethasone;Ultrasound  iontophoresis with dexamethasone   PT Next Visit Plan ionto with dex   Consulted and Agree with Plan of Care Patient      Patient will benefit from skilled therapeutic intervention in order to improve the following deficits and impairments:  Pain  Visit Diagnosis: Pain in left foot     Problem List Patient Active Problem List   Diagnosis Date Noted  . Costochondritis 12/26/2015  . Allergic state 02/09/2015  . Adult hypothyroidism 02/09/2015  . OP (osteoporosis) 02/09/2015  . AV (anaerobic vaginosis) 11/18/2014  . Non-traumatic rotator cuff tear 09/29/2013   Phillips Grout PT, DPT   Huprich,Jason 11/18/2016, 5:04 PM  [place] MAIN [facility] SERVICES [address] [place], [place], [address] Phone: [phone]   Fax:  [phone]  Name: [name] MRN: [id number] Date of Birth: May 14, 1950

## 2016-11-25 ENCOUNTER — Encounter: Payer: Self-pay | Admitting: Physical Therapy

## 2016-11-27 ENCOUNTER — Ambulatory Visit: Payer: Self-pay | Admitting: Physician Assistant

## 2016-11-27 ENCOUNTER — Encounter: Payer: Self-pay | Admitting: Physician Assistant

## 2016-11-27 VITALS — BP 110/90 | HR 98 | Temp 98.5°F | Resp 16

## 2016-11-27 DIAGNOSIS — J0100 Acute maxillary sinusitis, unspecified: Secondary | ICD-10-CM

## 2016-11-27 MED ORDER — AMOXICILLIN 875 MG PO TABS: 875.0000 mg | ORAL_TABLET | Freq: Two times a day (BID) | ORAL | 0 refills | Status: DC

## 2016-11-27 MED ORDER — PREDNISONE 10 MG PO TABS: 30.0000 mg | ORAL_TABLET | Freq: Every day | ORAL | 0 refills | Status: DC

## 2016-11-27 NOTE — Progress Notes (Signed)
S: C/o runny nose and congestion for 10 days, no fever, chills, cp/sob, v/d; mucus is clear/green and thick, cough is sporadic, c/o of facial and dental pain. Eyes are watery  Using otc meds:   O: PE: vitals wnl, nad, perrl eomi, normocephalic, tms dull, nasal mucosa red and swollen, throat injected, neck supple no lymph, lungs c t a, cv rrr, neuro intact  A:  Acute sinusitis   P: drink fluids, continue regular meds , use otc meds of choice, return if not improving in 5 days, return earlier if worsening , amoxil, pred 30mg  qd x 3d

## 2016-11-28 ENCOUNTER — Ambulatory Visit: Payer: 59 | Admitting: Physical Therapy

## 2016-11-29 DIAGNOSIS — E034 Atrophy of thyroid (acquired): Secondary | ICD-10-CM | POA: Diagnosis not present

## 2016-12-02 ENCOUNTER — Ambulatory Visit: Payer: 59 | Admitting: Physical Therapy

## 2016-12-05 ENCOUNTER — Ambulatory Visit
Admission: RE | Admit: 2016-12-05 | Discharge: 2016-12-05 | Disposition: A | Discharge status: Home / Self Care | Payer: 59 | Source: Ambulatory Visit | Attending: Internal Medicine | Admitting: Internal Medicine

## 2016-12-05 DIAGNOSIS — Z1231 Encounter for screening mammogram for malignant neoplasm of breast: Secondary | ICD-10-CM | POA: Diagnosis not present

## 2016-12-19 DIAGNOSIS — Z780 Asymptomatic menopausal state: Secondary | ICD-10-CM | POA: Diagnosis not present

## 2016-12-19 DIAGNOSIS — Z1211 Encounter for screening for malignant neoplasm of colon: Secondary | ICD-10-CM | POA: Diagnosis not present

## 2016-12-19 DIAGNOSIS — Z01419 Encounter for gynecological examination (general) (routine) without abnormal findings: Secondary | ICD-10-CM | POA: Diagnosis not present

## 2016-12-23 ENCOUNTER — Encounter: Payer: Self-pay | Admitting: Physician Assistant

## 2016-12-23 ENCOUNTER — Ambulatory Visit: Payer: Self-pay | Admitting: Physician Assistant

## 2016-12-23 VITALS — BP 120/80 | HR 99 | Temp 98.5°F

## 2016-12-23 DIAGNOSIS — J0100 Acute maxillary sinusitis, unspecified: Secondary | ICD-10-CM

## 2016-12-23 DIAGNOSIS — N390 Urinary tract infection, site not specified: Secondary | ICD-10-CM

## 2016-12-23 DIAGNOSIS — R300 Dysuria: Secondary | ICD-10-CM

## 2016-12-23 LAB — POCT URINALYSIS DIPSTICK
Bilirubin, UA: NEGATIVE
GLUCOSE UA: NEGATIVE
Ketones, UA: NEGATIVE
NITRITE UA: NEGATIVE
PROTEIN UA: NEGATIVE
RBC UA: NEGATIVE
SPEC GRAV UA: 1.02 (ref 1.010–1.025)
UROBILINOGEN UA: 0.2 U/dL
pH, UA: 5.5 (ref 5.0–8.0)

## 2016-12-23 MED ORDER — SULFAMETHOXAZOLE-TRIMETHOPRIM 800-160 MG PO TABS: 1.0000 | ORAL_TABLET | Freq: Two times a day (BID) | ORAL | 0 refills | Status: DC

## 2016-12-23 MED ORDER — PREDNISONE 10 MG PO TABS: 30.0000 mg | ORAL_TABLET | Freq: Every day | ORAL | 0 refills | Status: DC

## 2016-12-23 NOTE — Progress Notes (Signed)
S: C/o runny nose and congestion for 3 days, no fever, chills, cp/sob, v/d; mucus is green and thick, feels like she can't breathe through her nose, also some lower abd pressure, ?uti Using otc meds:   O: PE: vitals wnl, nad, perrl eomi, normocephalic, tms dull, nasal mucosa red and swollen on left, throat injected, neck supple no lymph, lungs c t a, cv rrr, neuro intact  A:  Acute sinusitis, uti   P: drink fluids, continue regular meds , use otc meds of choice, return if not improving in 5 days, return earlier if worsening , septra, pred 30mg  qd x 3d

## 2016-12-31 ENCOUNTER — Other Ambulatory Visit: Payer: Self-pay

## 2016-12-31 DIAGNOSIS — L820 Inflamed seborrheic keratosis: Secondary | ICD-10-CM | POA: Diagnosis not present

## 2016-12-31 DIAGNOSIS — L578 Other skin changes due to chronic exposure to nonionizing radiation: Secondary | ICD-10-CM | POA: Diagnosis not present

## 2016-12-31 DIAGNOSIS — L821 Other seborrheic keratosis: Secondary | ICD-10-CM | POA: Diagnosis not present

## 2017-01-13 ENCOUNTER — Ambulatory Visit: Payer: Self-pay | Admitting: Family

## 2017-01-13 VITALS — BP 130/98 | HR 99 | Temp 97.9°F

## 2017-01-13 DIAGNOSIS — M62830 Muscle spasm of back: Secondary | ICD-10-CM

## 2017-01-13 DIAGNOSIS — S335XXA Sprain of ligaments of lumbar spine, initial encounter: Secondary | ICD-10-CM

## 2017-01-13 MED ORDER — CYCLOBENZAPRINE HCL 10 MG PO TABS: 10.0000 mg | ORAL_TABLET | Freq: Three times a day (TID) | ORAL | 0 refills | Status: DC | PRN

## 2017-01-13 MED ORDER — PREDNISONE 10 MG (21) PO TBPK: ORAL_TABLET | ORAL | 0 refills | Status: DC

## 2017-01-13 NOTE — Progress Notes (Signed)
S/: Was cleaning out her pantry and lifted a bag full of canned food with onset of back pain not responding to heat , Motrin ,massage. She denies red flags. O/: Vital signs stable alert pleasant; gait is normal; moves without difficulty   R lumbar paraspinous muscular tenderness; decreased range of motion ;  ;reflexes and strength are equal  A/: Lumbar sprain, muscle spasm  P/: Supportive measures discussed. Sterapred pack 6 days, Flexeril 10 mg 1 by mouth every 8 hours when necessary #21 no refills. Topical heat or ice activities as tolerated and  As symptoms improve may do stretches  which are demonstrated . Follow-up when necessary not improving

## 2017-01-20 ENCOUNTER — Telehealth: Payer: Self-pay | Admitting: Physician Assistant

## 2017-01-20 NOTE — Progress Notes (Signed)
Per Manuela Schwartz ok'd another refill of Flexeril.  Contacted pharmacy spoke with [employer]

## 2017-01-20 NOTE — Telephone Encounter (Signed)
Have her come check her urine

## 2017-01-20 NOTE — Telephone Encounter (Signed)
Per Manuela Schwartz will give patient another round of muscle relaxer(Flexeril) but not steroid . Spoke with patient who acknowledge understanding

## 2017-01-27 ENCOUNTER — Encounter: Payer: Self-pay | Admitting: Physician Assistant

## 2017-01-27 ENCOUNTER — Ambulatory Visit: Payer: Self-pay | Admitting: Physician Assistant

## 2017-01-27 VITALS — BP 120/82 | HR 97 | Temp 97.7°F

## 2017-01-27 DIAGNOSIS — J0101 Acute recurrent maxillary sinusitis: Secondary | ICD-10-CM

## 2017-01-27 DIAGNOSIS — J209 Acute bronchitis, unspecified: Secondary | ICD-10-CM

## 2017-01-27 MED ORDER — ALBUTEROL SULFATE HFA 108 (90 BASE) MCG/ACT IN AERS: 2.0000 | INHALATION_SPRAY | Freq: Four times a day (QID) | RESPIRATORY_TRACT | 0 refills | Status: DC | PRN

## 2017-01-27 MED ORDER — FLUCONAZOLE 150 MG PO TABS
150.0000 mg | ORAL_TABLET | Freq: Once | ORAL | 0 refills | Status: AC
Start: 2017-01-27 — End: 2017-01-27

## 2017-01-27 MED ORDER — AMOXICILLIN 875 MG PO TABS: 875.0000 mg | ORAL_TABLET | Freq: Two times a day (BID) | ORAL | 0 refills | Status: DC

## 2017-01-27 NOTE — Progress Notes (Signed)
S: C/o runny nose and congestion for 3 days, no fever, chills, cp/sob, v/d; mucus is green and thick, cough is sporadic, c/o of facial and dental pain. Had flu shot over 2 weeks ago, chest feels tight from coughing like she can't take a deep breath  Using otc meds:   O: PE: vitals wnl, nad, perrl eomi, normocephalic, tms dull, nasal mucosa red and swollen, throat injected, neck supple no lymph, lungs c t a, cv rrr, neuro intact  A:  Acute sinusitis   P: drink fluids, continue regular meds , use otc meds of choice, return if not improving in 5 days, return earlier if worsening , amoxil, albuterol inhaler, diflucan if needed

## 2017-02-11 ENCOUNTER — Ambulatory Visit
Admission: RE | Admit: 2017-02-11 | Discharge: 2017-02-11 | Disposition: A | Discharge status: Home / Self Care | Payer: 59 | Source: Ambulatory Visit | Attending: Internal Medicine | Admitting: Internal Medicine

## 2017-02-11 DIAGNOSIS — M8588 Other specified disorders of bone density and structure, other site: Secondary | ICD-10-CM | POA: Insufficient documentation

## 2017-02-11 DIAGNOSIS — Z780 Asymptomatic menopausal state: Secondary | ICD-10-CM | POA: Diagnosis not present

## 2017-02-11 DIAGNOSIS — M85852 Other specified disorders of bone density and structure, left thigh: Secondary | ICD-10-CM | POA: Diagnosis not present

## 2017-03-03 ENCOUNTER — Ambulatory Visit: Payer: Self-pay | Admitting: Physician Assistant

## 2017-03-03 ENCOUNTER — Encounter: Payer: Self-pay | Admitting: Physician Assistant

## 2017-03-03 VITALS — BP 130/90 | HR 101 | Temp 98.0°F

## 2017-03-03 DIAGNOSIS — J069 Acute upper respiratory infection, unspecified: Secondary | ICD-10-CM

## 2017-03-03 MED ORDER — METHYLPREDNISOLONE 4 MG PO TBPK: ORAL_TABLET | ORAL | 0 refills | Status: DC

## 2017-03-03 NOTE — Progress Notes (Signed)
S: C/o runny nose and congestion for 1 days, feels tight in her chest, feels like mucus is stuck, no fever, chills, cp/sob, v/d; mucus has been clear  Using otc meds:   O: PE: vitals wnl, nad, perrl eomi, normocephalic, tms dull, nasal mucosa red and swollen, throat injected, voice is hoarse,  neck supple no lymph, lungs c t a, cv rrr, neuro intact  A:  Acute viral uri   P: drink fluids, continue regular meds , use otc meds of choice, return if not improving in 5 days, return earlier if worsening , medrol dose pack

## 2017-03-04 DIAGNOSIS — L820 Inflamed seborrheic keratosis: Secondary | ICD-10-CM | POA: Diagnosis not present

## 2017-03-04 DIAGNOSIS — D692 Other nonthrombocytopenic purpura: Secondary | ICD-10-CM | POA: Diagnosis not present

## 2017-03-04 DIAGNOSIS — L821 Other seborrheic keratosis: Secondary | ICD-10-CM | POA: Diagnosis not present

## 2017-03-04 DIAGNOSIS — L814 Other melanin hyperpigmentation: Secondary | ICD-10-CM | POA: Diagnosis not present

## 2017-03-04 DIAGNOSIS — L578 Other skin changes due to chronic exposure to nonionizing radiation: Secondary | ICD-10-CM | POA: Diagnosis not present

## 2017-03-04 DIAGNOSIS — L570 Actinic keratosis: Secondary | ICD-10-CM | POA: Diagnosis not present

## 2017-03-07 ENCOUNTER — Telehealth: Payer: Self-pay | Admitting: Physician Assistant

## 2017-03-07 MED ORDER — AMOXICILLIN-POT CLAVULANATE 875-125 MG PO TABS: 1.0000 | ORAL_TABLET | Freq: Two times a day (BID) | ORAL | 0 refills | Status: DC

## 2017-03-07 NOTE — Telephone Encounter (Signed)
Patient was contacted and informed of medication escribed over.

## 2017-03-07 NOTE — Telephone Encounter (Signed)
Pt still congested with green/yellow mucus, steroid did not help, ?if could get antibiotic, called in augmentin 875mg 

## 2017-03-20 IMAGING — CR DG CHEST 2V
1 series · 2 of 2 positions shown · non-contrast
Comparison: 02/29/2008

CLINICAL DATA: Cough.  Recent treatment for bronchitis.

EXAM:
CHEST  2 VIEW

[Series 1: w chest pa · 0.14mm/px · 2 of 2 slices shown]
[im 1/2]
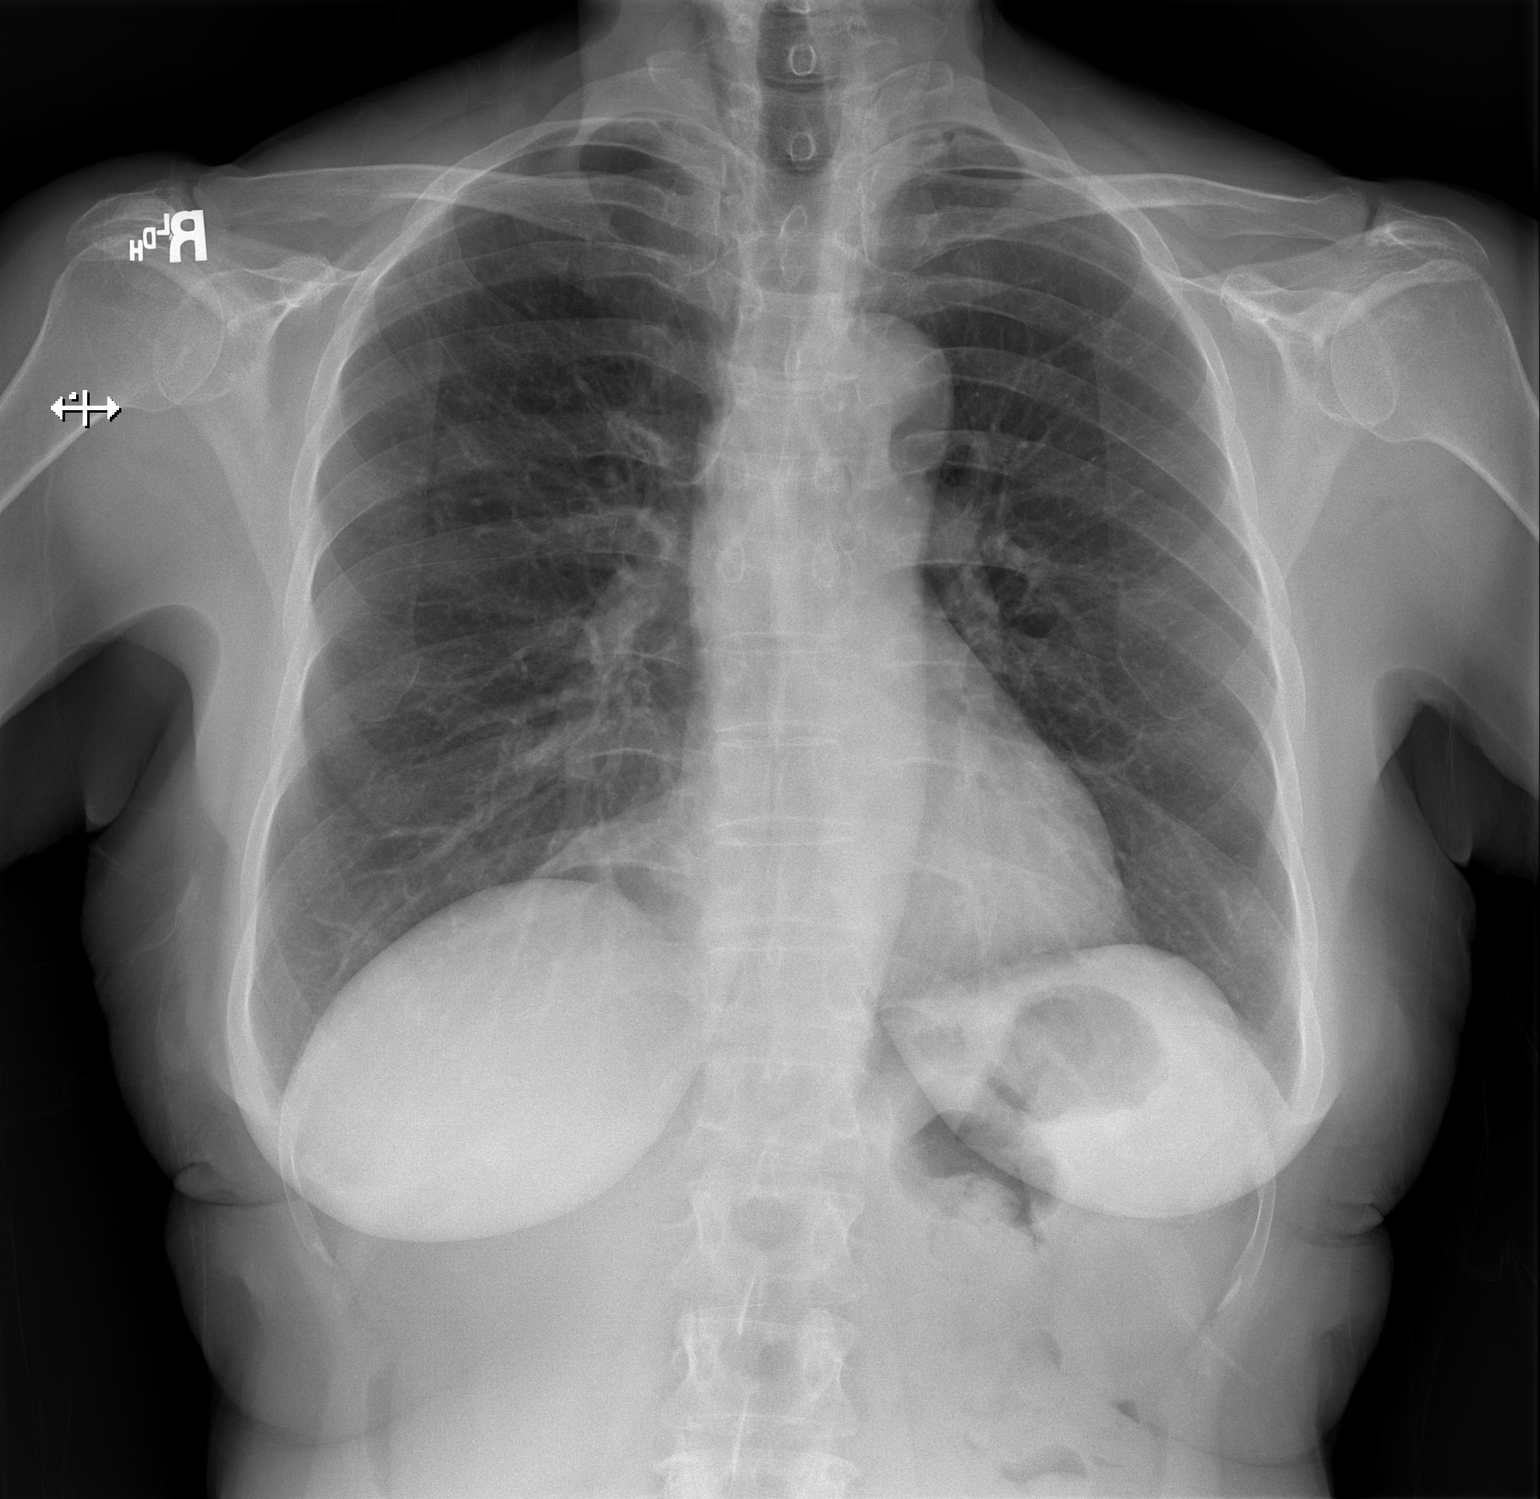
[im 2/2]
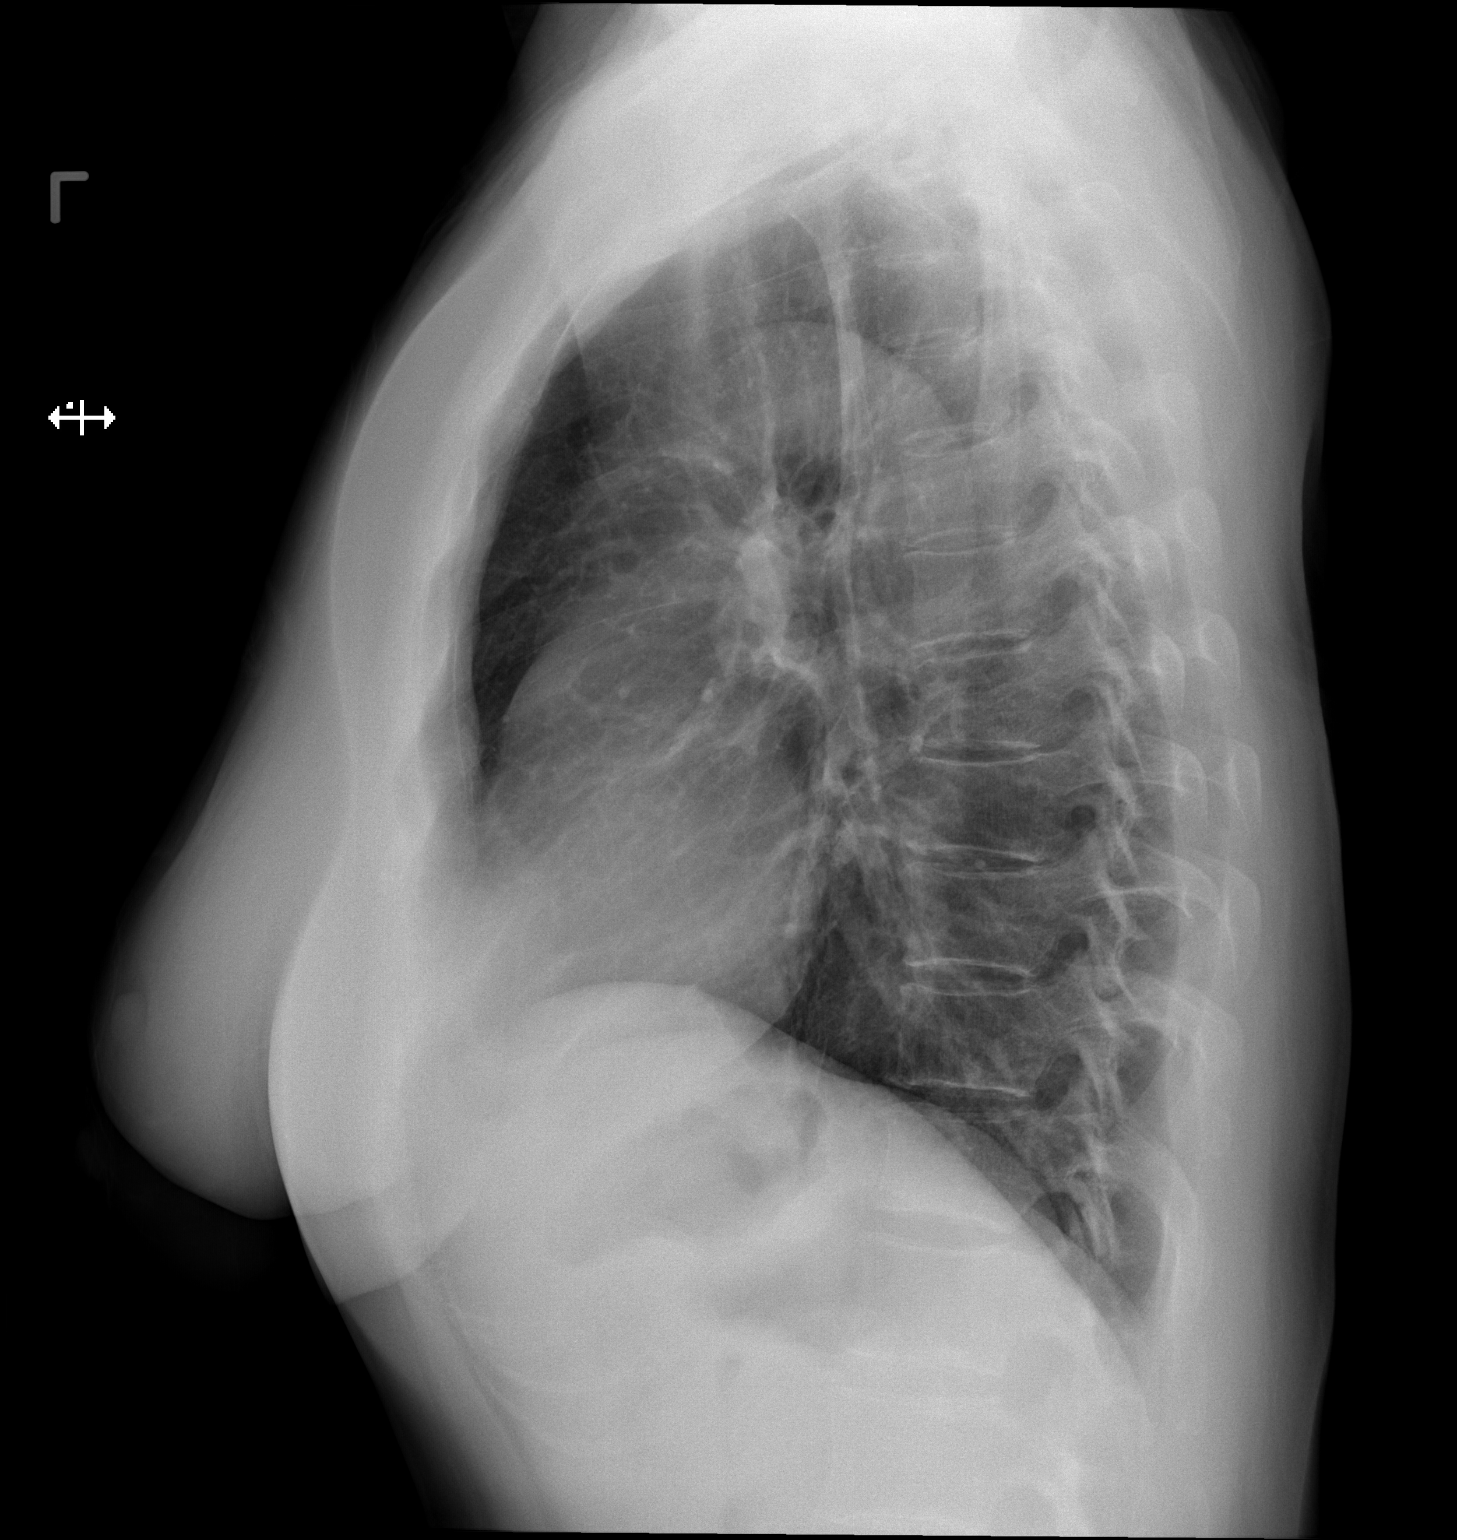

[2 of 2 positions shown; findings below may reference images not displayed]

FINDINGS: Heart and mediastinal contours are within normal limits. No focal
opacities or effusions. No acute bony abnormality.
IMPRESSION: No active cardiopulmonary disease.

## 2017-03-26 ENCOUNTER — Ambulatory Visit: Payer: Self-pay | Admitting: Physician Assistant

## 2017-03-26 ENCOUNTER — Encounter: Payer: Self-pay | Admitting: Physician Assistant

## 2017-03-26 VITALS — BP 130/90 | HR 94 | Temp 97.6°F

## 2017-03-26 DIAGNOSIS — J3489 Other specified disorders of nose and nasal sinuses: Secondary | ICD-10-CM

## 2017-03-26 MED ORDER — LEVOCETIRIZINE DIHYDROCHLORIDE 5 MG PO TABS: 5.0000 mg | ORAL_TABLET | Freq: Every evening | ORAL | 12 refills | Status: DC

## 2017-03-26 NOTE — Progress Notes (Signed)
S: Patient complains of runny nose, states it's [place], dripping out, no sinus pain or pressure, no fever back/chills\cough or congestion; patient has been on Allegra for long time, she has multiple nasal sprays at home  O: Vitals are normal, TMs are clear, nasal mucosa is boggy, throat is normal, neck is supple, no lymphadenopathy noted, lungs clear to auscultation, heart sounds are normal  A: Rhinorrhea  P: Stop using Allegra, prescription for Xyzal 5 mg daily at bedtime given, patient is to use her Astelin

## 2017-04-25 ENCOUNTER — Ambulatory Visit: Payer: Self-pay | Admitting: Nurse Practitioner

## 2017-04-25 VITALS — BP 140/89 | HR 106 | Temp 98.5°F | Resp 16

## 2017-04-25 DIAGNOSIS — H6692 Otitis media, unspecified, left ear: Secondary | ICD-10-CM

## 2017-04-25 MED ORDER — FLUTICASONE PROPIONATE 50 MCG/ACT NA SUSP: 2.0000 | Freq: Every day | NASAL | 0 refills | Status: DC

## 2017-04-25 MED ORDER — AMOXICILLIN 875 MG PO TABS: 875.0000 mg | ORAL_TABLET | Freq: Two times a day (BID) | ORAL | 0 refills | Status: AC

## 2017-04-25 NOTE — Progress Notes (Signed)
Subjective:     [name] is a 66 y.o. female who presents with ear pain and possible ear infection. Symptoms include: left ear pain. Onset of symptoms was 1 day ago, and have been gradually worsening since that time. Associated symptoms include: chills, congestion and headache.  Patient denies: post nasal drip, productive cough, sinus pressure, sneezing and sore throat. She is drinking plenty of fluids.  The following portions of the patient's history were reviewed and updated as appropriate: allergies, current medications and past medical history.  Review of Systems Constitutional: positive for chills Eyes: negative Ears, nose, mouth, throat, and face: positive for earaches and nasal congestion, negative for ear drainage, hearing loss, hoarseness and sore throat Respiratory: negative Cardiovascular: negative Neurological: positive for headaches Behavioral/Psych: negative   Objective:    BP 140/89   Pulse (!) 106   Temp 98.5 F (36.9 C) (Oral)   Resp 16   SpO2 97%  General:  alert, cooperative and fatigued  Right Ear: normal landmarks and mobility  Left Ear: TM bulging, erythematous  Mouth:  lips, mucosa, and tongue normal; teeth and gums normal  Neck: no adenopathy, no carotid bruit, no JVD, supple, symmetrical, trachea midline and thyroid not enlarged, symmetric, no tenderness/mass/nodules     Assessment:    Left acute otitis media   Plan:    Treatment: Amoxicillin. OTC analgesia as needed. Fluids, rest, avoid carbonated/alcoholic and caffeinated beverages.  Follow up in 5 days if not improving.

## 2017-06-06 DIAGNOSIS — M25562 Pain in left knee: Secondary | ICD-10-CM | POA: Diagnosis not present

## 2017-06-06 DIAGNOSIS — M1732 Unilateral post-traumatic osteoarthritis, left knee: Secondary | ICD-10-CM | POA: Diagnosis not present

## 2017-06-10 DIAGNOSIS — Z85828 Personal history of other malignant neoplasm of skin: Secondary | ICD-10-CM | POA: Diagnosis not present

## 2017-06-10 DIAGNOSIS — Z872 Personal history of diseases of the skin and subcutaneous tissue: Secondary | ICD-10-CM | POA: Diagnosis not present

## 2017-06-10 DIAGNOSIS — C44311 Basal cell carcinoma of skin of nose: Secondary | ICD-10-CM | POA: Diagnosis not present

## 2017-06-10 DIAGNOSIS — L578 Other skin changes due to chronic exposure to nonionizing radiation: Secondary | ICD-10-CM | POA: Diagnosis not present

## 2017-06-10 DIAGNOSIS — D485 Neoplasm of uncertain behavior of skin: Secondary | ICD-10-CM | POA: Diagnosis not present

## 2017-06-13 DIAGNOSIS — H353131 Nonexudative age-related macular degeneration, bilateral, early dry stage: Secondary | ICD-10-CM | POA: Diagnosis not present

## 2017-07-01 ENCOUNTER — Ambulatory Visit (INDEPENDENT_AMBULATORY_CARE_PROVIDER_SITE_OTHER): Payer: Self-pay | Admitting: Family Medicine

## 2017-07-01 VITALS — BP 179/80 | HR 92 | Temp 98.1°F | Wt 133.0 lb

## 2017-07-01 DIAGNOSIS — R030 Elevated blood-pressure reading, without diagnosis of hypertension: Secondary | ICD-10-CM

## 2017-07-01 DIAGNOSIS — R059 Cough, unspecified: Secondary | ICD-10-CM

## 2017-07-01 DIAGNOSIS — R300 Dysuria: Secondary | ICD-10-CM

## 2017-07-01 DIAGNOSIS — R05 Cough: Secondary | ICD-10-CM

## 2017-07-01 DIAGNOSIS — Z634 Disappearance and death of family member: Secondary | ICD-10-CM

## 2017-07-01 LAB — POCT URINALYSIS DIPSTICK
Bilirubin, UA: NEGATIVE
Glucose, UA: NEGATIVE
KETONES UA: NEGATIVE
Leukocytes, UA: NEGATIVE
Nitrite, UA: NEGATIVE
Protein, UA: NEGATIVE
Urobilinogen, UA: 0.2 E.U./dL
pH, UA: 6 (ref 5.0–8.0)

## 2017-07-01 NOTE — Progress Notes (Signed)
[name] is a 67 y/o female who is here with complaint of UTI and sinus congestion for 2 days. She also discloses that she lost her dog last week and has experienced some emotional distress- she also states that she attempted to make an appointment with her primary care provider who at this time did not have any appointment availability.   Review of Systems  Constitutional: Positive for chills, fever and malaise/fatigue. Negative for diaphoresis and weight loss.       Patient states 98.5 is a fever for her...  HENT: Negative for congestion, ear pain, hearing loss, sinus pain and tinnitus.   Eyes: Negative for blurred vision and double vision.  Respiratory: Positive for cough and sputum production. Negative for hemoptysis.   Cardiovascular: Negative for chest pain, palpitations and orthopnea.  Gastrointestinal: Negative for abdominal pain, diarrhea, heartburn, nausea and vomiting.  Genitourinary:       Complaints of burning but denies frequency or urgency  Musculoskeletal: Positive for back pain and myalgias. Negative for neck pain.  Neurological: Negative for dizziness, weakness and headaches.   Physical Exam  Constitutional: She is oriented to person, place, and time. She appears well-developed and well-nourished.  HENT:  Head: Normocephalic.  Eyes: Pupils are equal, round, and reactive to light.  Neck: Normal range of motion.  Cardiovascular: Normal rate and regular rhythm.  Pulmonary/Chest: Effort normal and breath sounds normal. No stridor. No respiratory distress. She has no wheezes.  Abdominal: Soft. Bowel sounds are normal. She exhibits no distension. There is no tenderness. There is no guarding.  Musculoskeletal: Normal range of motion.  Neurological: She is alert and oriented to person, place, and time.  Psychiatric: She has a normal mood and affect.   A: 1. Dysuria   2. Cough   3. Death of family member     P: No clinical findings on exam related to cough to UTI  1.  Dysuria - POCT Urinalysis Dipstick- NEGATIVE  2. Cough- No cough exhibited on exam-  3. Death of family member-  Patient lost 2 dogs in the last 6 months one as recently as last week- discussed the benefit of follow up with PCP for psychology evaluation   4. Elevated blood pressure reading- Patient denies a history of HTN- PE/Vital otherwise WNL- No acute concerns based on exam- condition may be related to acute/untreated chronic anxiety- advised PCP evaluation

## 2017-07-02 NOTE — Patient Instructions (Signed)
PLAN< Discussed the etiology, signs and symptoms and treatment options of UTI- reviewed risk factors and normal results of UA POCT. Advised patient if symptoms persisted to follow up in primary care for treatment. Urinary Tract Infection, Adult A urinary tract infection (UTI) is an infection of any part of the urinary tract. The urinary tract includes the:  Kidneys.  Ureters.  Bladder.  Urethra.  These organs make, store, and get rid of pee (urine) in the body. Follow these instructions at home:  Take over-the-counter and prescription medicines only as told by your doctor.  If you were prescribed an antibiotic medicine, take it as told by your doctor. Do not stop taking the antibiotic even if you start to feel better.  Avoid the following drinks: ? Alcohol. ? Caffeine. ? Tea. ? Carbonated drinks.  Drink enough fluid to keep your pee clear or pale yellow.  Keep all follow-up visits as told by your doctor. This is important.  Make sure to: ? Empty your bladder often and completely. Do not to hold pee for long periods of time. ? Empty your bladder before and after sex. ? Wipe from front to back after a bowel movement if you are female. Use each tissue one time when you wipe. Contact a doctor if:  You have back pain.  You have a fever.  You feel sick to your stomach (nauseous).  You throw up (vomit).  Your symptoms do not get better after 3 days.  Your symptoms go away and then come back. Get help right away if:  You have very bad back pain.  You have very bad lower belly (abdominal) pain.  You are throwing up and cannot keep down any medicines or water. This information is not intended to replace advice given to you by your health care provider. Make sure you discuss any questions you have with your health care provider. Document Released: 10/09/2007 Document Revised: 09/28/2015 Document Reviewed: 03/13/2015 Elsevier Interactive Patient Education  2018 [place] [employer].  Cough, Adult A cough helps to clear your throat and lungs. A cough may last only 2-3 weeks (acute), or it may last longer than 8 weeks (chronic). Many different things can cause a cough. A cough may be a sign of an illness or another medical condition. Follow these instructions at home:  Pay attention to any changes in your cough.  Take medicines only as told by your doctor. ? If you were prescribed an antibiotic medicine, take it as told by your doctor. Do not stop taking it even if you start to feel better. ? Talk with your doctor before you try using a cough medicine.  Drink enough fluid to keep your pee (urine) clear or pale yellow.  If the air is dry, use a cold steam vaporizer or humidifier in your home.  Stay away from things that make you cough at work or at home.  If your cough is worse at night, try using extra pillows to raise your head up higher while you sleep.  Do not smoke, and try not to be around smoke. If you need help quitting, ask your doctor.  Do not have caffeine.  Do not drink alcohol.  Rest as needed. Contact a doctor if:  You have new problems (symptoms).  You cough up yellow fluid (pus).  Your cough does not get better after 2-3 weeks, or your cough gets worse.  Medicine does not help your cough and you are not sleeping well.  You have pain that  gets worse or pain that is not helped with medicine.  You have a fever.  You are losing weight and you do not know why.  You have night sweats. Get help right away if:  You cough up blood.  You have trouble breathing.  Your heartbeat is very fast. This information is not intended to replace advice given to you by your health care provider. Make sure you discuss any questions you have with your health care provider. Document Released: 01/03/2011 Document Revised: 09/28/2015 Document Reviewed: 06/29/2014 Elsevier Interactive Patient Education  [employer].

## 2017-08-03 ENCOUNTER — Other Ambulatory Visit: Payer: Self-pay

## 2017-08-03 ENCOUNTER — Ambulatory Visit
Admission: EM | Admit: 2017-08-03 | Discharge: 2017-08-03 | Disposition: A | Discharge status: Home / Self Care | Payer: 59 | Attending: Emergency Medicine | Admitting: Emergency Medicine

## 2017-08-03 DIAGNOSIS — R030 Elevated blood-pressure reading, without diagnosis of hypertension: Secondary | ICD-10-CM

## 2017-08-03 DIAGNOSIS — J0140 Acute pansinusitis, unspecified: Secondary | ICD-10-CM | POA: Diagnosis not present

## 2017-08-03 MED ORDER — AMOXICILLIN-POT CLAVULANATE 875-125 MG PO TABS: 1.0000 | ORAL_TABLET | Freq: Two times a day (BID) | ORAL | 0 refills | Status: DC

## 2017-08-03 NOTE — ED Triage Notes (Signed)
Patient complains of sinus pain and pressure that started over one week ago.

## 2017-08-03 NOTE — Discharge Instructions (Addendum)
discontinue the Zyrtec and Allegra, start some Mucinex.  Finish the Augmentin.  continue the Neti pot, Flonase, Nasonex.  Keep an eye on your blood pressure. Measure your blood pressure once a day, preferably at the same time every day. Keep a log of this and bring it to your next doctor's appointment. Return immediately to the ER if you start having chest pain, headache, problems seeing, problems talking, problems walking, if you feel like you're about to pass out, if you do pass out, if you have a seizure, or for any other concerns.  Go to www.goodrx.com to look up your medications. This will give you a list of where you can find your prescriptions at the most affordable prices. Or ask the pharmacist what the cash price is, or if they have any other discount programs available to help make your medication more affordable. This can be less expensive than what you would pay with insurance.

## 2017-08-03 NOTE — ED Provider Notes (Signed)
HPI  SUBJECTIVE:  [name] is a 67 y.o. female who presents with 1 week of sinus pain and pressure, intermittent, minutes long bilateral ear pain, thick yellowish greenish nasal congestion, postnasal drip.  She denies fevers above 100.4, rhinorrhea, change in hearing, otorrhea, upper dental pain.  She reports itchy, watery eyes, but no sneezing.  She tried Neti pot, Flonase, Nasonex, allergy eyedrops, Zyrtec and Allegra without improvement in her symptoms.  Sinus pain and pressure is worse with bending forward.  No antibiotics in the past month.  No antipyretic in the past 6-8 hours.  She has a past medical history of sinusitis and states that this feels similar to that.  She also has a history of whitecoat hypertension, states that her blood pressure is normal at home.  No history of diabetes.  FGH:WEXHB, Tonette Bihari, MD     History reviewed. No pertinent past medical history.  Past Surgical History:  Procedure Laterality Date  . KNEE SURGERY    . TOTAL VAGINAL HYSTERECTOMY      Family History  Problem Relation Age of Onset  . Lung cancer Father     Social History   Tobacco Use  . Smoking status: Never Smoker  . Smokeless tobacco: Never Used  Substance Use Topics  . Alcohol use: No    Alcohol/week: 0.0 oz  . Drug use: No    No current facility-administered medications for this encounter.   Current Outpatient Medications:  .  calcium carbonate (OS-CAL) 1250 (500 Ca) MG chewable tablet, Chew 1 tablet by mouth daily., Disp: , Rfl:  .  cetirizine (ZYRTEC) 10 MG chewable tablet, Chew 10 mg by mouth daily., Disp: , Rfl:  .  cholecalciferol (VITAMIN D) 1000 units tablet, Take 1,000 Units by mouth daily., Disp: , Rfl:  .  fexofenadine (ALLEGRA) 30 MG tablet, Take 30 mg by mouth 2 (two) times daily., Disp: , Rfl:  .  fluticasone (FLONASE) 50 MCG/ACT nasal spray, Place 2 sprays into both nostrils daily for 10 days., Disp: 16 g, Rfl: 0 .  mometasone (NASONEX) 50 MCG/ACT nasal  spray, Place into the nose., Disp: , Rfl:  .  Multiple Vitamin (MULTI-VITAMINS) TABS, Take by mouth., Disp: , Rfl:  .  amoxicillin-clavulanate (AUGMENTIN) 875-125 MG tablet, Take 1 tablet by mouth 2 (two) times daily. X 7 days, Disp: 14 tablet, Rfl: 0  Allergies  Allergen Reactions  . Iodinated Diagnostic Agents Hives     ROS  As noted in HPI.   Physical Exam  BP (!) 184/103 (BP Location: Left Arm)   Pulse 82   Temp 98.3 F (36.8 C) (Oral)   Resp 18   Ht 5\' 3"  (1.6 m)   Wt 133 lb (60.3 kg)   SpO2 100%   BMI 23.56 kg/m   Constitutional: Well developed, well nourished, no acute distress Eyes:  EOMI, conjunctiva normal bilaterally HENT: Normocephalic, atraumatic,mucus membranes moist.  Purulent nasal congestion.  Normal turbinates.  Positive maxillary and frontal sinus tenderness.  Mild postnasal drip.   Respiratory: Normal inspiratory effort Cardiovascular: Normal rate GI: nondistended skin: No rash, skin intact Musculoskeletal: no deformities Neurologic: Alert & oriented x 3, no focal neuro deficits Psychiatric: Speech and behavior appropriate   ED Course   Medications - No data to display  No orders of the defined types were placed in this encounter.   No results found for this or any previous visit (from the past 24 hour(s)). No results found.  ED Clinical Impression  Acute  pansinusitis, recurrence not specified  Elevated blood pressure reading in office with white coat syndrome, without diagnosis of hypertension   ED Assessment/Plan  Feel that patient qualifies for antibiotics given duration and severity of symptoms.  We will have her discontinue the Zyrtec and Allegra, start some Mucinex.  Home with Augmentin.  She is to continue the [place] pot, Flonase, Nasonex.  Follow-up with her primary care physician as needed.  Blood pressure noted.  Patient states that she has whitecoat hypertension.  She is otherwise asymptomatic.  Denies chest pain, headache,  strokelike symptoms.  We will repeat this. Repeat BP higher, but pt still asxatic.  We will have her keep an eye on her blood pressure, keep a log of it, and follow-up with her primary care physician in a week if it remains elevated.  Discussed signs and symptoms of hypertensive emergency that should prompt immediate return to the emergency department.  Discussed medical decision making, treatment plan and plan for follow-up with patient.  She agrees with plan.  Meds ordered this encounter  Medications  . amoxicillin-clavulanate (AUGMENTIN) 875-125 MG tablet    Sig: Take 1 tablet by mouth 2 (two) times daily. X 7 days    Dispense:  14 tablet    Refill:  0    *This clinic note was created using [employer]. Therefore, there may be occasional mistakes despite careful proofreading.   ?   Melynda Ripple, MD 08/05/17 1118

## 2017-09-05 DIAGNOSIS — M1811 Unilateral primary osteoarthritis of first carpometacarpal joint, right hand: Secondary | ICD-10-CM | POA: Diagnosis not present

## 2017-09-05 DIAGNOSIS — M189 Osteoarthritis of first carpometacarpal joint, unspecified: Secondary | ICD-10-CM | POA: Insufficient documentation

## 2017-09-16 DIAGNOSIS — M1732 Unilateral post-traumatic osteoarthritis, left knee: Secondary | ICD-10-CM | POA: Diagnosis not present

## 2017-09-23 DIAGNOSIS — M1732 Unilateral post-traumatic osteoarthritis, left knee: Secondary | ICD-10-CM | POA: Diagnosis not present

## 2017-09-24 DIAGNOSIS — Z85828 Personal history of other malignant neoplasm of skin: Secondary | ICD-10-CM | POA: Insufficient documentation

## 2017-09-24 DIAGNOSIS — C44311 Basal cell carcinoma of skin of nose: Secondary | ICD-10-CM | POA: Diagnosis not present

## 2017-09-30 DIAGNOSIS — M1732 Unilateral post-traumatic osteoarthritis, left knee: Secondary | ICD-10-CM | POA: Diagnosis not present

## 2017-10-03 ENCOUNTER — Encounter: Payer: Self-pay | Admitting: Family Medicine

## 2017-10-03 ENCOUNTER — Ambulatory Visit (INDEPENDENT_AMBULATORY_CARE_PROVIDER_SITE_OTHER): Payer: 59 | Admitting: Family Medicine

## 2017-10-03 VITALS — BP 160/100 | HR 96 | Temp 98.1°F | Wt 138.0 lb

## 2017-10-03 DIAGNOSIS — W57XXXS Bitten or stung by nonvenomous insect and other nonvenomous arthropods, sequela: Secondary | ICD-10-CM

## 2017-10-03 DIAGNOSIS — R21 Rash and other nonspecific skin eruption: Secondary | ICD-10-CM

## 2017-10-03 MED ORDER — DOXYCYCLINE HYCLATE 100 MG PO CAPS
100.0000 mg | ORAL_CAPSULE | Freq: Two times a day (BID) | ORAL | 0 refills | Status: DC
Start: 2017-10-03 — End: 2017-12-31

## 2017-10-03 MED ORDER — TRIAMCINOLONE ACETONIDE 0.1 % EX CREA: 1.0000 "application " | TOPICAL_CREAM | Freq: Two times a day (BID) | CUTANEOUS | 0 refills | Status: DC

## 2017-10-03 NOTE — Patient Instructions (Signed)

## 2017-10-03 NOTE — Progress Notes (Signed)
Patient ID: [name], female    DOB: April 12, 1951, 67 y.o.   MRN: [id number]  PCP: Adin Hector, MD  Chief Complaint  Patient presents with  . choice-rash/tick    Subjective:  HPI [name] is a 67 y.o. female presents for evaluation of tick bite and rash on the left flank,  left arm and right knee. Recent history of basal cell carcinoma. Treatment to date for skin cancer has only included surgical removal of tumor. Today she complains of removal of tick from her upper right leg x 3 days. Reports intact removal of insect. She denies fever, fatigue, new weakness, or GI symptoms. She reports concern of rash of the left flank, left arm, and right knee. The rashes developed last week. Rashes are occasionally itching. She has worked outdoors and is concern for possible poison oak contact. She has used only previously prescribed Mupirocin ointment without improvement of rash or itchiness.  Social History   Socioeconomic History  . Marital status: Married    Spouse name: Not on file  . Number of children: Not on file  . Years of education: Not on file  . Highest education level: Not on file  Occupational History  . Not on file  Social Needs  . Financial resource strain: Not on file  . Food insecurity:    Worry: Not on file    Inability: Not on file  . Transportation needs:    Medical: Not on file    Non-medical: Not on file  Tobacco Use  . Smoking status: Never Smoker  . Smokeless tobacco: Never Used  Substance and Sexual Activity  . Alcohol use: No    Alcohol/week: 0.0 oz  . Drug use: No  . Sexual activity: Not on file  Lifestyle  . Physical activity:    Days per week: Not on file    Minutes per session: Not on file  . Stress: Not on file  Relationships  . Social connections:    Talks on phone: Not on file    Gets together: Not on file    Attends religious service: Not on file    Active member of club or organization: Not on file    Attends meetings of clubs  or organizations: Not on file    Relationship status: Not on file  . Intimate partner violence:    Fear of current or ex partner: Not on file    Emotionally abused: Not on file    Physically abused: Not on file    Forced sexual activity: Not on file  Other Topics Concern  . Not on file  Social History Narrative  . Not on file    Family History  Problem Relation Age of Onset  . Lung cancer Father    Review of Systems Pertinent negatives included in HPI  Patient Active Problem List   Diagnosis Date Noted  . Costochondritis 12/26/2015  . Allergic state 02/09/2015  . Adult hypothyroidism 02/09/2015  . OP (osteoporosis) 02/09/2015  . AV (anaerobic vaginosis) 11/18/2014  . Non-traumatic rotator cuff tear 09/29/2013    Allergies  Allergen Reactions  . Iodinated Diagnostic Agents Hives    Prior to Admission medications   Medication Sig Start Date End Date Taking? Authorizing Provider  albuterol (VENTOLIN HFA) 108 (90 Base) MCG/ACT inhaler 1 inhalation every 4 (four) hours as needed 01/27/17  Yes [provider]  calcium carbonate (OS-CAL) 1250 (500 Ca) MG chewable tablet Chew 1 tablet by mouth daily.  Yes [provider]  cetirizine (ZYRTEC) 10 MG chewable tablet Chew 10 mg by mouth daily.   Yes [provider]  cholecalciferol (VITAMIN D) 1000 units tablet Take 1,000 Units by mouth daily.   Yes [provider]  fexofenadine (ALLEGRA) 30 MG tablet Take 30 mg by mouth 2 (two) times daily.   Yes [provider]  ipratropium (ATROVENT) 0.03 % nasal spray Place into the nose. 04/10/17  Yes [provider]  mometasone (NASONEX) 50 MCG/ACT nasal spray Place into the nose. 10/26/14  Yes [provider]  Multiple Vitamin (MULTI-VITAMINS) TABS Take by mouth.   Yes [provider]  mupirocin ointment (BACTROBAN) 2 % once daily 09/10/17  Yes [provider]  amoxicillin-clavulanate (AUGMENTIN) 875-125 MG tablet  Take 1 tablet by mouth 2 (two) times daily. X 7 days Patient not taking: Reported on 10/03/2017 08/03/17   Melynda Ripple, MD  fluticasone [facility]) 50 MCG/ACT nasal spray Place 2 sprays into both nostrils daily for 10 days. 04/25/17 08/03/17  Kara Dies, NP    Past Medical, Surgical Family and Social History reviewed and updated.    Objective:   Today's Vitals   10/03/17 1350  BP: (!) 160/100  Pulse: 96  Temp: 98.1 F (36.7 C)  SpO2: 100%  Weight: 138 lb (62.6 kg)    Wt Readings from Last 3 Encounters:  10/03/17 138 lb (62.6 kg)  08/03/17 133 lb (60.3 kg)  07/01/17 133 lb (60.3 kg)    Physical Exam  Cardiovascular: Normal rate, regular rhythm, normal heart sounds and intact distal pulses.  Pulmonary/Chest: Effort normal and breath sounds normal.  Skin: Lesion and rash noted. Rash is macular and papular. There is erythema.  Left sided facial swelling from recent operative procedure to remove malignant tumor from face   Psychiatric: She has a normal mood and affect. Her behavior is normal. Judgment and thought content normal.    Assessment & Plan:  1. Skin rash 2. Insect bite, unspecified site, sequela  Will start prophylaxis treatment for cellulitis, Lyme disease, and [place] spotted fever with a 10 days course of Doxycycline.   For itching, will prescribe a low-potency steriodal cream prescribed for itching.   See orders: - doxycycline (VIBRAMYCIN) 100 MG capsule; Take 1 capsule (100 mg total) by mouth 2 (two) times daily. - triamcinolone cream (KENALOG) 0.1 %; Apply 1 application topically 2 (two) times daily.  If symptoms worsen or do not improve, return for follow-up, follow-up with PCP, or at the emergency department if severity of symptoms warrant a higher level of care.     Carroll Sage. Kenton Kingfisher, MSN, FNP-C [facility]  [place] [place], [place] [address] [phone]

## 2017-10-04 DIAGNOSIS — L0100 Impetigo, unspecified: Secondary | ICD-10-CM | POA: Diagnosis not present

## 2017-10-04 DIAGNOSIS — L03211 Cellulitis of face: Secondary | ICD-10-CM | POA: Diagnosis not present

## 2017-10-28 DIAGNOSIS — M810 Age-related osteoporosis without current pathological fracture: Secondary | ICD-10-CM | POA: Diagnosis not present

## 2017-10-28 DIAGNOSIS — Z0000 Encounter for general adult medical examination without abnormal findings: Secondary | ICD-10-CM | POA: Diagnosis not present

## 2017-10-28 DIAGNOSIS — E034 Atrophy of thyroid (acquired): Secondary | ICD-10-CM | POA: Diagnosis not present

## 2017-11-04 ENCOUNTER — Other Ambulatory Visit: Payer: Self-pay | Admitting: Internal Medicine

## 2017-11-04 DIAGNOSIS — E034 Atrophy of thyroid (acquired): Secondary | ICD-10-CM | POA: Diagnosis not present

## 2017-11-04 DIAGNOSIS — Z1231 Encounter for screening mammogram for malignant neoplasm of breast: Secondary | ICD-10-CM

## 2017-11-04 DIAGNOSIS — M8589 Other specified disorders of bone density and structure, multiple sites: Secondary | ICD-10-CM | POA: Diagnosis not present

## 2017-11-04 DIAGNOSIS — Z0000 Encounter for general adult medical examination without abnormal findings: Secondary | ICD-10-CM | POA: Diagnosis not present

## 2017-11-10 DIAGNOSIS — R300 Dysuria: Secondary | ICD-10-CM | POA: Diagnosis not present

## 2017-11-10 DIAGNOSIS — R350 Frequency of micturition: Secondary | ICD-10-CM | POA: Diagnosis not present

## 2017-11-27 DIAGNOSIS — M533 Sacrococcygeal disorders, not elsewhere classified: Secondary | ICD-10-CM | POA: Diagnosis not present

## 2017-11-27 DIAGNOSIS — M545 Low back pain: Secondary | ICD-10-CM | POA: Diagnosis not present

## 2017-11-27 DIAGNOSIS — E034 Atrophy of thyroid (acquired): Secondary | ICD-10-CM | POA: Diagnosis not present

## 2017-12-02 DIAGNOSIS — L578 Other skin changes due to chronic exposure to nonionizing radiation: Secondary | ICD-10-CM | POA: Diagnosis not present

## 2017-12-02 DIAGNOSIS — L821 Other seborrheic keratosis: Secondary | ICD-10-CM | POA: Diagnosis not present

## 2017-12-02 DIAGNOSIS — L820 Inflamed seborrheic keratosis: Secondary | ICD-10-CM | POA: Diagnosis not present

## 2017-12-02 DIAGNOSIS — Z85828 Personal history of other malignant neoplasm of skin: Secondary | ICD-10-CM | POA: Diagnosis not present

## 2017-12-08 ENCOUNTER — Ambulatory Visit
Admission: RE | Admit: 2017-12-08 | Discharge: 2017-12-08 | Disposition: A | Discharge status: Home / Self Care | Payer: 59 | Source: Ambulatory Visit | Attending: Internal Medicine | Admitting: Internal Medicine

## 2017-12-08 DIAGNOSIS — Z1231 Encounter for screening mammogram for malignant neoplasm of breast: Secondary | ICD-10-CM | POA: Insufficient documentation

## 2017-12-31 ENCOUNTER — Ambulatory Visit (INDEPENDENT_AMBULATORY_CARE_PROVIDER_SITE_OTHER): Payer: 59 | Admitting: Family Medicine

## 2017-12-31 ENCOUNTER — Encounter: Payer: Self-pay | Admitting: Family Medicine

## 2017-12-31 VITALS — BP 130/94 | HR 97 | Temp 98.1°F | Wt 130.0 lb

## 2017-12-31 DIAGNOSIS — B9789 Other viral agents as the cause of diseases classified elsewhere: Secondary | ICD-10-CM

## 2017-12-31 DIAGNOSIS — J988 Other specified respiratory disorders: Secondary | ICD-10-CM

## 2017-12-31 MED ORDER — IPRATROPIUM BROMIDE 0.03 % NA SOLN: 2.0000 | Freq: Three times a day (TID) | NASAL | 0 refills | Status: DC

## 2017-12-31 NOTE — Patient Instructions (Signed)
Supportive therapy indicated at present.  I recommend Atrovent nasal spray 2 sprays per nares three times daily. Continue Levocetirizine.  If no improvement within 48 hours or symptoms worsen, follow-up with [place] here in office.     Allergies An allergy is when your body reacts to a substance in a way that is not normal. An allergic reaction can happen after you:  Eat something.  Breathe in something.  Touch something.  You can be allergic to:  Things that are only around during certain seasons, like molds and pollens.  Foods.  Drugs.  Insects.  Animal dander.  What are the signs or symptoms?  Puffiness (swelling). This may happen on the lips, face, tongue, mouth, or throat.  Sneezing.  Coughing.  Breathing loudly (wheezing).  Stuffy nose.  Tingling in the mouth.  A rash.  Itching.  Itchy, red, puffy areas of skin (hives).  Watery eyes.  Throwing up (vomiting).  Watery poop (diarrhea).  Dizziness.  Feeling faint or fainting.  Trouble breathing or swallowing.  A tight feeling in the chest.  A fast heartbeat. How is this diagnosed? Allergies can be diagnosed with:  A medical and family history.  Skin tests.  Blood tests.  A food diary. A food diary is a record of all the foods, drinks, and symptoms you have each day.  The results of an elimination diet. This diet involves making sure not to eat certain foods and then seeing what happens when you start eating them again.  How is this treated? There is no cure for allergies, but allergic reactions can be treated with medicine. Severe reactions usually need to be treated at a hospital. How is this prevented? The best way to prevent an allergic reaction is to avoid the thing you are allergic to. Allergy shots and medicines can also help prevent reactions in some cases. This information is not intended to replace advice given to you by your health care provider. Make sure you discuss any  questions you have with your health care provider. Document Released: 08/17/2012 Document Revised: 12/18/2015 Document Reviewed: 02/01/2014 Elsevier Interactive Patient Education  2018 [employer].    Viral Illness, Adult Viruses are tiny germs that can get into a person's body and cause illness. There are many different types of viruses, and they cause many types of illness. Viral illnesses can range from mild to severe. They can affect various parts of the body. Common illnesses that are caused by a virus include colds and the flu. Viral illnesses also include serious conditions such as HIV/AIDS (human immunodeficiency virus/acquired immunodeficiency syndrome). A few viruses have been linked to certain cancers. What are the causes? Many types of viruses can cause illness. Viruses invade cells in your body, multiply, and cause the infected cells to malfunction or die. When the cell dies, it releases more of the virus. When this happens, you develop symptoms of the illness, and the virus continues to spread to other cells. If the virus takes over the function of the cell, it can cause the cell to divide and grow out of control, as is the case when a virus causes cancer. Different viruses get into the body in different ways. You can get a virus by:  Swallowing food or water that is contaminated with the virus.  Breathing in droplets that have been coughed or sneezed into the air by an infected person.  Touching a surface that has been contaminated with the virus and then touching your eyes, nose, or mouth.  Being bitten by an insect or animal that carries the virus.  Having sexual contact with a person who is infected with the virus.  Being exposed to blood or fluids that contain the virus, either through an open cut or during a transfusion.  If a virus enters your body, your body's defense system (immune system) will try to fight the virus. You may be at higher risk for a viral illness if  your immune system is weak. What are the signs or symptoms? Symptoms vary depending on the type of virus and the location of the cells that it invades. Common symptoms of the main types of viral illnesses include: Cold and flu viruses  Fever.  Headache.  Sore throat.  Muscle aches.  Nasal congestion.  Cough. Digestive system (gastrointestinal) viruses  Fever.  Abdominal pain.  Nausea.  Diarrhea. Liver viruses (hepatitis)  Loss of appetite.  Tiredness.  Yellowing of the skin (jaundice). Brain and spinal cord viruses  Fever.  Headache.  Stiff neck.  Nausea and vomiting.  Confusion or sleepiness. Skin viruses  Warts.  Itching.  Rash. Sexually transmitted viruses  Discharge.  Swelling.  Redness.  Rash. How is this treated? Viruses can be difficult to treat because they live within cells. Antibiotic medicines do not treat viruses because these drugs do not get inside cells. Treatment for a viral illness may include:  Resting and drinking plenty of fluids.  Medicines to relieve symptoms. These can include over-the-counter medicine for pain and fever, medicines for cough or congestion, and medicines to relieve diarrhea.  Antiviral medicines. These drugs are available only for certain types of viruses. They may help reduce flu symptoms if taken early. There are also many antiviral medicines for hepatitis and HIV/AIDS.  Some viral illnesses can be prevented with vaccinations. A common example is the flu shot. Follow these instructions at home: Medicines   Take over-the-counter and prescription medicines only as told by your health care provider.  If you were prescribed an antiviral medicine, take it as told by your health care provider. Do not stop taking the medicine even if you start to feel better.  Be aware of when antibiotics are needed and when they are not needed. Antibiotics do not treat viruses. If your health care provider thinks that you  may have a bacterial infection as well as a viral infection, you may get an antibiotic. ? Do not ask for an antibiotic prescription if you have been diagnosed with a viral illness. That will not make your illness go away faster. ? Frequently taking antibiotics when they are not needed can lead to antibiotic resistance. When this develops, the medicine no longer works against the bacteria that it normally fights. General instructions  Drink enough fluids to keep your urine clear or pale yellow.  Rest as much as possible.  Return to your normal activities as told by your health care provider. Ask your health care provider what activities are safe for you.  Keep all follow-up visits as told by your health care provider. This is important. How is this prevented? Take these actions to reduce your risk of viral infection:  Eat a healthy diet and get enough rest.  Wash your hands often with soap and water. This is especially important when you are in public places. If soap and water are not available, use hand sanitizer.  Avoid close contact with friends and family who have a viral illness.  If you travel to areas where viral gastrointestinal infection is common,  avoid drinking water or eating raw food.  Keep your immunizations up to date. Get a flu shot every year as told by your health care provider.  Do not share toothbrushes, nail clippers, razors, or needles with other people.  Always practice safe sex.  Contact a health care provider if:  You have symptoms of a viral illness that do not go away.  Your symptoms come back after going away.  Your symptoms get worse. Get help right away if:  You have trouble breathing.  You have a severe headache or a stiff neck.  You have severe vomiting or abdominal pain. This information is not intended to replace advice given to you by your health care provider. Make sure you discuss any questions you have with your health care  provider. Document Released: 09/01/2015 Document Revised: 10/04/2015 Document Reviewed: 09/01/2015 Elsevier Interactive Patient Education  [employer].

## 2017-12-31 NOTE — Progress Notes (Signed)
Patient ID: [name], female    DOB: Oct 28, 1950, 67 y.o.   MRN: [id number]  PCP: Adin Hector, MD  Chief Complaint  Patient presents with  . choice-nasal congestion    Subjective:  HPI [name] is a 67 y.o. female presents for evaluation congestion, left ear pain, and feeling ill x 3 days.  Upper Respiratory Infection:  Patient complains of symptoms of a URI.  Symptoms include left ear pain radiating down her neck , fatigue, and nasal/chest congestion. Denies fever, chills, coughing, wheezing, or sneezing. Relief attempted: chronic antihistamines, daily Nasonex and Flonase without significant improvement of symptoms. Hx includes chronic seasonal allergies. Social History   Socioeconomic History  . Marital status: Married    Spouse name: Not on file  . Number of children: Not on file  . Years of education: Not on file  . Highest education level: Not on file  Occupational History  . Not on file  Social Needs  . Financial resource strain: Not on file  . Food insecurity:    Worry: Not on file    Inability: Not on file  . Transportation needs:    Medical: Not on file    Non-medical: Not on file  Tobacco Use  . Smoking status: Never Smoker  . Smokeless tobacco: Never Used  Substance and Sexual Activity  . Alcohol use: No    Alcohol/week: 0.0 standard drinks  . Drug use: No  . Sexual activity: Not on file  Lifestyle  . Physical activity:    Days per week: Not on file    Minutes per session: Not on file  . Stress: Not on file  Relationships  . Social connections:    Talks on phone: Not on file    Gets together: Not on file    Attends religious service: Not on file    Active member of club or organization: Not on file    Attends meetings of clubs or organizations: Not on file    Relationship status: Not on file  . Intimate partner violence:    Fear of current or ex partner: Not on file    Emotionally abused: Not on file    Physically abused: Not on  file    Forced sexual activity: Not on file  Other Topics Concern  . Not on file  Social History Narrative  . Not on file    Family History  Problem Relation Age of Onset  . Lung cancer Father   . Breast cancer Neg Hx    Review of Systems Pertinent negatives listed in HPI Patient Active Problem List   Diagnosis Date Noted  . Costochondritis 12/26/2015  . Allergic state 02/09/2015  . Adult hypothyroidism 02/09/2015  . OP (osteoporosis) 02/09/2015  . AV (anaerobic vaginosis) 11/18/2014  . Non-traumatic rotator cuff tear 09/29/2013    Allergies  Allergen Reactions  . Iodinated Diagnostic Agents Hives    Prior to Admission medications   Medication Sig Start Date End Date Taking? Authorizing Provider  albuterol (VENTOLIN HFA) 108 (90 Base) MCG/ACT inhaler 1 inhalation every 4 (four) hours as needed 01/27/17  Yes [provider]  calcium carbonate (OS-CAL) 1250 (500 Ca) MG chewable tablet Chew 1 tablet by mouth daily.   Yes [provider]  cetirizine (ZYRTEC) 10 MG chewable tablet Chew 10 mg by mouth daily.   Yes [provider]  cholecalciferol (VITAMIN D) 1000 units tablet Take 1,000 Units by mouth daily.   Yes [provider]  fexofenadine (ALLEGRA) 30 MG tablet Take 30 mg by mouth 2 (two) times daily.   Yes [provider]  mometasone (NASONEX) 50 MCG/ACT nasal spray Place into the nose. 10/26/14  Yes [provider]  Multiple Vitamin (MULTI-VITAMINS) TABS Take by mouth.   Yes [provider]  fluticasone (FLONASE) 50 MCG/ACT nasal spray Place 2 sprays into both nostrils daily for 10 days. 04/25/17 08/03/17  Kara Dies, NP  ipratropium (ATROVENT) 0.03 % nasal spray Place into the nose. 04/10/17   [provider]  mupirocin ointment (BACTROBAN) 2 % once daily 09/10/17   [provider]  triamcinolone cream (KENALOG) 0.1 % Apply 1 application topically 2 (two) times daily. Patient not  taking: Reported on 12/31/2017 10/03/17   Scot Jun, FNP   Past Medical, Surgical Family and Social History reviewed and updated.   Objective:   Today's Vitals   12/31/17 1315  BP: (!) 130/94  Pulse: 97  Temp: 98.1 F (36.7 C)  SpO2: 98%  Weight: 130 lb (59 kg)    Wt Readings from Last 3 Encounters:  12/31/17 130 lb (59 kg)  10/03/17 138 lb (62.6 kg)  08/03/17 133 lb (60.3 kg)    Physical Exam Constitutional: Patient appears well-developed and well-nourished. No distress. HENT: Normocephalic, atraumatic, External right and left ear normal. Oropharynx is clear and moist.  Eyes: Conjunctivae and EOM are normal. PERRLA, no scleral icterus. Neck: Normal ROM. Neck supple. No JVD. No tracheal deviation. No thyromegaly. CVS: RRR, S1/S2 +, no murmurs, no gallops, no carotid bruit.  Pulmonary: Effort and breath sounds normal, no stridor, rhonchi, wheezes, rales.   Assessment & Plan:  1. Viral respiratory illness, physical exam unremarkable.  -Supportive treatment recommended to continue. -Discontinue Flonase, will trial Atrovent nasal spray 3 times daily as needed -Continue Levocetirizine. If symptoms worsen or do not improve within 48 hours will consider antibiotic therapy.  Meds ordered this encounter  Medications  . ipratropium (ATROVENT) 0.03 % nasal spray    Sig: Place 2 sprays into both nostrils 3 (three) times daily.    Dispense:  30 mL    Refill:  0     If symptoms worsen or do not improve, return for follow-up, follow-up with PCP, or at the emergency department if severity of symptoms warrant a higher level of care.     Carroll Sage. Kenton Kingfisher, MSN, FNP-C [facility]  [address] [place], [place] [address] [phone]

## 2018-01-01 DIAGNOSIS — E039 Hypothyroidism, unspecified: Secondary | ICD-10-CM | POA: Diagnosis not present

## 2018-01-02 ENCOUNTER — Other Ambulatory Visit: Payer: Self-pay | Admitting: Physician Assistant

## 2018-01-02 ENCOUNTER — Telehealth: Payer: Self-pay | Admitting: Emergency Medicine

## 2018-01-02 DIAGNOSIS — B9689 Other specified bacterial agents as the cause of diseases classified elsewhere: Secondary | ICD-10-CM

## 2018-01-02 DIAGNOSIS — J019 Acute sinusitis, unspecified: Principal | ICD-10-CM

## 2018-01-02 DIAGNOSIS — Z299 Encounter for prophylactic measures, unspecified: Secondary | ICD-10-CM

## 2018-01-02 MED ORDER — AZITHROMYCIN 250 MG PO TABS: ORAL_TABLET | ORAL | 0 refills | Status: AC

## 2018-01-02 MED ORDER — FLUCONAZOLE 150 MG PO TABS: 150.0000 mg | ORAL_TABLET | Freq: Once | ORAL | 0 refills | Status: AC

## 2018-01-02 NOTE — Telephone Encounter (Signed)
Patient was informed that medication was sent over to [facility].

## 2018-01-02 NOTE — Telephone Encounter (Signed)
Spoke with patient who informed me that she is not any better congestion in chest and nasal. Per Kim(Provider ) would prescribe antibiotic if patient wasn't any better. Also diflucan due to possible yeast with antibiotic.Patient uses [facility].

## 2018-01-02 NOTE — Telephone Encounter (Signed)
I have reviewed the documentation.  Will send in z-pack a diflucan.  Please make patient aware. Philis Fendt, MS, PA-C 9:07 AM, 01/02/2018

## 2018-01-06 ENCOUNTER — Telehealth: Payer: Self-pay | Admitting: Emergency Medicine

## 2018-01-06 DIAGNOSIS — M461 Sacroiliitis, not elsewhere classified: Secondary | ICD-10-CM | POA: Diagnosis not present

## 2018-01-06 NOTE — Telephone Encounter (Signed)
Patient contacted office stating that she was given a zpak on Friday and informed me that this antibiotic doesn't work well for her. But proceeded to take it since she picked it up late Friday evening. And will be taking  her last dose today. I instructed her that she would have to see if this antibiotic works for her so  will wait till this upcoming Friday(01/09/2018) to let me know how she is doing. Provider was informed and agree to wait till then. Patient to contact office then.

## 2018-01-19 ENCOUNTER — Encounter: Payer: Self-pay | Admitting: Family Medicine

## 2018-01-19 ENCOUNTER — Ambulatory Visit (INDEPENDENT_AMBULATORY_CARE_PROVIDER_SITE_OTHER): Payer: 59 | Admitting: Family Medicine

## 2018-01-19 VITALS — BP 170/110 | HR 88 | Temp 98.2°F | Wt 138.0 lb

## 2018-01-19 DIAGNOSIS — H9202 Otalgia, left ear: Secondary | ICD-10-CM

## 2018-01-19 DIAGNOSIS — J019 Acute sinusitis, unspecified: Secondary | ICD-10-CM

## 2018-01-19 DIAGNOSIS — R0989 Other specified symptoms and signs involving the circulatory and respiratory systems: Secondary | ICD-10-CM

## 2018-01-19 MED ORDER — PREDNISONE 20 MG PO TABS: 40.0000 mg | ORAL_TABLET | Freq: Every day | ORAL | 0 refills | Status: AC

## 2018-01-19 MED ORDER — CIPROFLOXACIN-DEXAMETHASONE 0.3-0.1 % OT SUSP: 4.0000 [drp] | Freq: Two times a day (BID) | OTIC | 0 refills | Status: DC

## 2018-01-19 MED ORDER — AMOXICILLIN-POT CLAVULANATE 875-125 MG PO TABS: 1.0000 | ORAL_TABLET | Freq: Two times a day (BID) | ORAL | 0 refills | Status: DC

## 2018-01-19 MED ORDER — ALBUTEROL SULFATE HFA 108 (90 BASE) MCG/ACT IN AERS: 2.0000 | INHALATION_SPRAY | RESPIRATORY_TRACT | 1 refills | Status: DC | PRN

## 2018-01-19 NOTE — Progress Notes (Signed)
Patient ID: [name], female    DOB: 1950-05-18, 67 y.o.   MRN: [id number]  PCP: Adin Hector, MD  Chief Complaint  Patient presents with  . Sinusitis  . Allergies    Subjective:  HPI [name] is a 67 y.o. female presents for evaluation sinus and chest congestion.  SINUSITIS Onset: 3 weeks with complaint of facial/sinus pressure, nasal congestion, and cough.  Severity: moderate. She was seen in office on 12/31/2017 and treated for symptoms only. She was later placed on Azithromycin which she reports mild improvement of symptoms however, notes ZPAK never works for her. Tried OTC meds without significant relief. Continues to use Atrovent nasal spray and antihistamine therapy as recommended with minimal to no relief.  Symptoms:  URI prodrome with nasal congestion, mild sinus headache, left ear pain,  History of allergy symptoms, recent exposure to fragrance irritant. No significant Sore Throat  Remainder of Review of Systems negative except as noted in the HPI. Social History   Socioeconomic History  . Marital status: Married    Spouse name: Not on file  . Number of children: Not on file  . Years of education: Not on file  . Highest education level: Not on file  Occupational History  . Not on file  Social Needs  . Financial resource strain: Not on file  . Food insecurity:    Worry: Not on file    Inability: Not on file  . Transportation needs:    Medical: Not on file    Non-medical: Not on file  Tobacco Use  . Smoking status: Never Smoker  . Smokeless tobacco: Never Used  Substance and Sexual Activity  . Alcohol use: No    Alcohol/week: 0.0 standard drinks  . Drug use: No  . Sexual activity: Not on file  Lifestyle  . Physical activity:    Days per week: Not on file    Minutes per session: Not on file  . Stress: Not on file  Relationships  . Social connections:    Talks on phone: Not on file    Gets together: Not on file    Attends religious  service: Not on file    Active member of club or organization: Not on file    Attends meetings of clubs or organizations: Not on file    Relationship status: Not on file  . Intimate partner violence:    Fear of current or ex partner: Not on file    Emotionally abused: Not on file    Physically abused: Not on file    Forced sexual activity: Not on file  Other Topics Concern  . Not on file  Social History Narrative  . Not on file    Family History  Problem Relation Age of Onset  . Lung cancer Father   . Breast cancer Neg Hx    Review of Systems Pertinent negatives listed in HPI Patient Active Problem List   Diagnosis Date Noted  . Costochondritis 12/26/2015  . Allergic state 02/09/2015  . Adult hypothyroidism 02/09/2015  . OP (osteoporosis) 02/09/2015  . AV (anaerobic vaginosis) 11/18/2014  . Non-traumatic rotator cuff tear 09/29/2013    Allergies  Allergen Reactions  . Iodinated Diagnostic Agents Hives    Prior to Admission medications   Medication Sig Start Date End Date Taking? Authorizing Provider  calcium carbonate (OS-CAL) 1250 (500 Ca) MG chewable tablet Chew 1 tablet by mouth daily.   Yes [provider]  cetirizine (ZYRTEC) 10 MG  chewable tablet Chew 10 mg by mouth daily.   Yes [provider]  cholecalciferol (VITAMIN D) 1000 units tablet Take 1,000 Units by mouth daily.   Yes [provider]  fexofenadine (ALLEGRA) 30 MG tablet Take 30 mg by mouth 2 (two) times daily.   Yes [provider]  ipratropium (ATROVENT) 0.03 % nasal spray Place 2 sprays into both nostrils 3 (three) times daily. 12/31/17  Yes Scot Jun, FNP  Multiple Vitamin (MULTI-VITAMINS) TABS Take by mouth.   Yes [provider]  triamcinolone cream (KENALOG) 0.1 % Apply 1 application topically 2 (two) times daily. 10/03/17  Yes Scot Jun, FNP  albuterol (PROVENTIL HFA;VENTOLIN HFA) 108 (90 Base) MCG/ACT inhaler Inhale 2 puffs into the  lungs every 4 (four) hours as needed for wheezing or shortness of breath (cough, shortness of breath or wheezing.). 01/19/18   Scot Jun, FNP  amoxicillin-clavulanate (AUGMENTIN) 875-125 MG tablet Take 1 tablet by mouth 2 (two) times daily. 01/19/18   Scot Jun, FNP  fluticasone (FLONASE) 50 MCG/ACT nasal spray Place 2 sprays into both nostrils daily for 10 days. 04/25/17 08/03/17  Kara Dies, NP  mometasone (NASONEX) 50 MCG/ACT nasal spray Place into the nose. 10/26/14   [provider]  mupirocin ointment (BACTROBAN) 2 % once daily 09/10/17   [provider]  predniSONE (DELTASONE) 20 MG tablet Take 2 tablets (40 mg total) by mouth daily with breakfast for 5 days. 01/19/18 01/24/18  Scot Jun, FNP    Past Medical, Surgical Family and Social History reviewed and updated.    Objective:   Today's Vitals   01/19/18 0825  BP: (!) 170/110  Pulse: 88  Temp: 98.2 F (36.8 C)  SpO2: 99%  Weight: 138 lb (62.6 kg)    Wt Readings from Last 3 Encounters:  01/19/18 138 lb (62.6 kg)  12/31/17 130 lb (59 kg)  10/03/17 138 lb (62.6 kg)    Physical Exam  Constitutional: She is oriented to person, place, and time. She appears well-developed and well-nourished.  HENT:  Nose: Mucosal edema, rhinorrhea and sinus tenderness present.  Mouth/Throat: Uvula is midline and oropharynx is clear and moist.  Cardiovascular: Normal rate and regular rhythm.  Pulmonary/Chest: Effort normal and breath sounds normal. She has no wheezes. She exhibits no tenderness.  Lymphadenopathy:    She has no cervical adenopathy.  Neurological: She is alert and oriented to person, place, and time.  Psychiatric: Her mood appears anxious. Her affect is labile.    Assessment & Plan:  1. Acute sinusitis, recurrence not specified, unspecified location 2. Left ear pain 3. Chest congestion  Explained to patient that symptoms are mostly likely related to allergies and could  possible be an underlying unresolved sinusitis. Routinely it is inappropriate to treat sinusitis with two antibiotic in less than 30 days but opted to trial as patient had not had any significant relief of symptoms.  See plan below. If no improvement follow-up with PCP.  Meds ordered this encounter  Medications  . amoxicillin-clavulanate (AUGMENTIN) 875-125 MG tablet    Sig: Take 1 tablet by mouth 2 (two) times daily.    Dispense:  20 tablet    Refill:  0  . predniSONE (DELTASONE) 20 MG tablet    Sig: Take 2 tablets (40 mg total) by mouth daily with breakfast for 5 days.    Dispense:  10 tablet    Refill:  0  . albuterol (PROVENTIL HFA;VENTOLIN HFA) 108 (90 Base) MCG/ACT  inhaler    Sig: Inhale 2 puffs into the lungs every 4 (four) hours as needed for wheezing or shortness of breath (cough, shortness of breath or wheezing.).    Dispense:  1 Inhaler    Refill:  1  . ciprofloxacin-dexamethasone (CIPRODEX) OTIC suspension    Sig: Place 4 drops into the left ear 2 (two) times daily.    Dispense:  7.5 mL    Refill:  0     If symptoms worsen or do not improve, return for follow-up, follow-up with PCP, or at the emergency department if severity of symptoms warrant a higher level of care.    Carroll Sage. Kenton Kingfisher, MSN, FNP-C [facility]  [address] [place], [place] [address] [phone]

## 2018-01-19 NOTE — Patient Instructions (Signed)
I will prescribed Augmentin 1 tablet twice daily x 10 days.  Ciprodex for ear pain. Continue Levocetirizine 5 mg daily Atrovent nasal spray for nasal congestion.  As you are being treated with a second antibiotic in less than 30 days, I recommend if your symptoms do not improve or resolve, the next step would be to follow-up with your PCP for possible ENT referral.   Sinusitis, Adult Sinusitis is soreness and inflammation of your sinuses. Sinuses are hollow spaces in the bones around your face. They are located:  Around your eyes.  In the middle of your forehead.  Behind your nose.  In your cheekbones.  Your sinuses and nasal passages are lined with a stringy fluid (mucus). Mucus normally drains out of your sinuses. When your nasal tissues get inflamed or swollen, the mucus can get trapped or blocked so air cannot flow through your sinuses. This lets bacteria, viruses, and funguses grow, and that leads to infection. Follow these instructions at home: Medicines  Take, use, or apply over-the-counter and prescription medicines only as told by your doctor. These may include nasal sprays.  If you were prescribed an antibiotic medicine, take it as told by your doctor. Do not stop taking the antibiotic even if you start to feel better. Hydrate and Humidify  Drink enough water to keep your pee (urine) clear or pale yellow.  Use a cool mist humidifier to keep the humidity level in your home above 50%.  Breathe in steam for 10-15 minutes, 3-4 times a day or as told by your doctor. You can do this in the bathroom while a hot shower is running.  Try not to spend time in cool or dry air. Rest  Rest as much as possible.  Sleep with your head raised (elevated).  Make sure to get enough sleep each night. General instructions  Put a warm, moist washcloth on your face 3-4 times a day or as told by your doctor. This will help with discomfort.  Wash your hands often with soap and water. If  there is no soap and water, use hand sanitizer.  Do not smoke. Avoid being around people who are smoking (secondhand smoke).  Keep all follow-up visits as told by your doctor. This is important. Contact a doctor if:  You have a fever.  Your symptoms get worse.  Your symptoms do not get better within 10 days. Get help right away if:  You have a very bad headache.  You cannot stop throwing up (vomiting).  You have pain or swelling around your face or eyes.  You have trouble seeing.  You feel confused.  Your neck is stiff.  You have trouble breathing. This information is not intended to replace advice given to you by your health care provider. Make sure you discuss any questions you have with your health care provider. Document Released: 10/09/2007 Document Revised: 12/17/2015 Document Reviewed: 02/15/2015 Elsevier Interactive Patient Education  [employer].

## 2018-01-21 ENCOUNTER — Telehealth: Payer: Self-pay | Admitting: Emergency Medicine

## 2018-01-21 NOTE — Telephone Encounter (Signed)
Spoke with patient stated that she is getting better.

## 2018-02-05 DIAGNOSIS — E039 Hypothyroidism, unspecified: Secondary | ICD-10-CM | POA: Diagnosis not present

## 2018-02-11 DIAGNOSIS — C44311 Basal cell carcinoma of skin of nose: Secondary | ICD-10-CM | POA: Diagnosis not present

## 2018-02-17 DIAGNOSIS — M461 Sacroiliitis, not elsewhere classified: Secondary | ICD-10-CM | POA: Diagnosis not present

## 2018-03-03 DIAGNOSIS — M62830 Muscle spasm of back: Secondary | ICD-10-CM | POA: Diagnosis not present

## 2018-03-03 DIAGNOSIS — M461 Sacroiliitis, not elsewhere classified: Secondary | ICD-10-CM | POA: Diagnosis not present

## 2018-03-03 DIAGNOSIS — M5136 Other intervertebral disc degeneration, lumbar region: Secondary | ICD-10-CM | POA: Diagnosis not present

## 2018-03-06 ENCOUNTER — Encounter: Payer: Self-pay | Admitting: Physician Assistant

## 2018-03-06 ENCOUNTER — Ambulatory Visit (INDEPENDENT_AMBULATORY_CARE_PROVIDER_SITE_OTHER): Payer: 59 | Admitting: Physician Assistant

## 2018-03-06 VITALS — BP 140/90 | HR 97 | Temp 97.8°F | Wt 139.0 lb

## 2018-03-06 DIAGNOSIS — J069 Acute upper respiratory infection, unspecified: Secondary | ICD-10-CM

## 2018-03-06 DIAGNOSIS — J029 Acute pharyngitis, unspecified: Secondary | ICD-10-CM

## 2018-03-06 LAB — POCT RAPID STREP A (OFFICE): Rapid Strep A Screen: NEGATIVE

## 2018-03-06 MED ORDER — MOMETASONE FUROATE 50 MCG/ACT NA SUSP
2.0000 | Freq: Every day | NASAL | 0 refills | Status: DC
Start: 2018-03-06 — End: 2018-08-11

## 2018-03-06 MED ORDER — OXYMETAZOLINE HCL 0.05 % NA SOLN: 1.0000 | Freq: Two times a day (BID) | NASAL | 0 refills | Status: DC

## 2018-03-06 NOTE — Progress Notes (Signed)
Patient ID: [name] DOB: December 07, 1950 AGE: 67 y.o. MRN: [id number]   PCP: Adin Hector, MD   Chief Complaint:  Chief Complaint  Patient presents with  . choice-ear pain/throat issue     Subjective:    HPI:  [name] is a 67 y.o. female presents for evaluation  Chief Complaint  Patient presents with  . choice-ear pain/throat issue    67 year old female presents to [facility] with 2 day history of URI symptoms. Began yesterday mid morning with malaise and fatigue. Associated body aches. Then developed nasal congestion, sore throat, and bilateral ear pain. Describes bilateral ear pain as fullness/pressure. Sore throat aggravated with swallowing. Associated hoarse voice. Has been using allergy management medication: Nasonex, daily NettiPot, Zyrtec, and Allegra. Took ibuprofen this morning at approximately 5am. Patient states she believes someone's strong perfume set her off yesterday; initiated symptoms. Patient prone to sinusitis; last treated by [facility] on 01/02/2018 with Z-pak and then again on 01/19/2018 with Augmentin.   Patient did receive this season's influenza vaccination, first week of September.  Patient reports previous history of strep throat. Has had episodes as an adult. Patient also with previous history of mononucleosis, in her 70s.  Patient denies fever, chills, (does admit to feeling warm), headache, ear discharge/drainage, dizziness/lightheadedness, sinus pain/pressure, cough, chest pain, SOB, wheezing, abdominal pain, nausea/vomiting.  A complete, at least 10 system review of symptoms was performed, pertinent positives and negatives as mentioned in HPI, otherwise negative.  The following portions of the patient's history were reviewed and updated as appropriate: allergies, current medications and past medical history.  Patient Active Problem List   Diagnosis Date Noted  . Costochondritis 12/26/2015  . Allergic state  02/09/2015  . Adult hypothyroidism 02/09/2015  . OP (osteoporosis) 02/09/2015  . AV (anaerobic vaginosis) 11/18/2014  . Non-traumatic rotator cuff tear 09/29/2013    Allergies  Allergen Reactions  . Iodinated Diagnostic Agents Hives    Current Outpatient Medications on File Prior to Visit  Medication Sig Dispense Refill  . albuterol (PROVENTIL HFA;VENTOLIN HFA) 108 (90 Base) MCG/ACT inhaler Inhale 2 puffs into the lungs every 4 (four) hours as needed for wheezing or shortness of breath (cough, shortness of breath or wheezing.). 1 Inhaler 1  . calcium carbonate (OS-CAL) 1250 (500 Ca) MG chewable tablet Chew 1 tablet by mouth daily.    . cetirizine (ZYRTEC) 10 MG chewable tablet Chew 10 mg by mouth daily.    . cholecalciferol (VITAMIN D) 1000 units tablet Take 1,000 Units by mouth daily.    . fexofenadine (ALLEGRA) 30 MG tablet Take 30 mg by mouth 2 (two) times daily.    Marland Kitchen ipratropium (ATROVENT) 0.03 % nasal spray Place 2 sprays into both nostrils 3 (three) times daily. 30 mL 0  . mometasone (NASONEX) 50 MCG/ACT nasal spray Place into the nose.    . Multiple Vitamin (MULTI-VITAMINS) TABS Take by mouth.     No current facility-administered medications on file prior to visit.        Objective:   Vitals:   03/06/18 0842  BP: 140/90  Pulse: 97  Temp: 97.8 F (36.6 C)  SpO2: 99%     Wt Readings from Last 3 Encounters:  03/06/18 139 lb (63 kg)  01/19/18 138 lb (62.6 kg)  12/31/17 130 lb (59 kg)    Physical Exam:   General Appearance:  Alert, cooperative, appears stated age. In no acute distress. Afebrile.  Head:  Normocephalic, without obvious abnormality, atraumatic  Eyes:  PERRL, conjunctiva/corneas clear, EOM's intact, fundi benign, both eyes  Ears:  Ear canals WNL bilaterally. Left TM with very faint erythema. Right TM WNL.  Nose: Nares normal, septum midline. No discharge. Nasal mucosa with scant erythema. No sinus tenderness with percussion/palpation.  Throat: Lips,  mucosa, and tongue normal; teeth and gums normal. Throat reveals no erythema. Tonsils with no enlargement or exudate.  Neck: Supple, symmetrical, trachea midline, minimal bilateral anterior cervical lymphadenopathy;  thyroid: not enlarged, symmetric, no tenderness/mass/nodules; no carotid bruit or JVD  Back:   Symmetric, no curvature, ROM normal, no CVA tenderness  Lungs:   Clear to auscultation bilaterally, respirations unlabored. Clear aeration. No cough during examination.  Heart:  Regular rate and rhythm, S1 and S2 normal, no murmur, rub, or gallop  Abdomen:   Soft, non-tender, bowel sounds active all four quadrants,  no masses, no organomegaly  Extremities: Extremities normal, atraumatic, no cyanosis or edema  Pulses: 2+ and symmetric  Skin: Skin color, texture, turgor normal, no rashes or lesions  Lymph nodes: Cervical, supraclavicular, and axillary nodes normal  Neurologic: Normal    Assessment & Plan:    Exam findings, diagnosis etiology and medication use and indications reviewed with patient. Follow-Up and discharge instructions provided. No emergent/urgent issues found on exam.  Patient education was provided.   Patient verbalized understanding of information provided and agrees with plan of care (POC), all questions answered. The patient is advised to call or return to clinic if condition does not see an improvement in symptoms, or to seek the care of the closest emergency department if condition worsens with the below plan.    1. Upper respiratory tract infection, unspecified type  - POCT rapid strep A Negative rapid strep test  2. Pharyngitis, unspecified etiology  Patient with 2 day history of URI symptoms. Believe is self limited viral URI. Discussed at-home treatment/management with patient. Prescribed Nasonex and Afrin nasal spray. Discussed symptoms warranting return to [facility] or evaluation by PCP.   [name], MHS, PA-C [name], MHS,  PA-C Advanced Practice Provider [facility]  [address] [facility], [place], [place] [address] (p):  [phone] [email or web address]

## 2018-03-06 NOTE — Patient Instructions (Addendum)
Thank you for choosing [facility] for your health care needs.  You have been diagnosed with a upper respiratory infection (a cold).  Your rapid strep test performed in the office today was negative.  Recommend you increase fluids. Rest. May continue to take Tylenol or ibuprofen for body aches and/or fever. May use over the counter decongestant such as Sudafed, Dayquil, or Mucinex-D.  Continue to use antihistamine such as Zyrtec or Allegra. Continue to use Netti-Pot and/or may use saline nasal spray.  You have been prescribed a refill of your Nasonex nasal spray. You have been prescribed Afrin nasal spray (a powerful decongestant nasal spray). Only use for 3-4 days. Then stop. Can cause rebound congestion with extended use.  Return to [facility] or follow-up with PCP if your symptoms last longer than 1 week or if your symptoms significantly worsen.  Upper Respiratory Infection, Adult Most upper respiratory infections (URIs) are caused by a virus. A URI affects the nose, throat, and upper air passages. The most common type of URI is often called "the common cold." Follow these instructions at home:  Take medicines only as told by your doctor.  Gargle warm saltwater or take cough drops to comfort your throat as told by your doctor.  Use a warm mist humidifier or inhale steam from a shower to increase air moisture. This may make it easier to breathe.  Drink enough fluid to keep your pee (urine) clear or pale yellow.  Eat soups and other clear broths.  Have a healthy diet.  Rest as needed.  Go back to work when your fever is gone or your doctor says it is okay. ? You may need to stay home longer to avoid giving your URI to others. ? You can also wear a face mask and wash your hands often to prevent spread of the virus.  Use your inhaler more if you have asthma.  Do not use any tobacco products, including cigarettes, chewing tobacco, or electronic cigarettes. If you need help  quitting, ask your doctor. Contact a doctor if:  You are getting worse, not better.  Your symptoms are not helped by medicine.  You have chills.  You are getting more short of breath.  You have brown or red mucus.  You have yellow or brown discharge from your nose.  You have pain in your face, especially when you bend forward.  You have a fever.  You have puffy (swollen) neck glands.  You have pain while swallowing.  You have white areas in the back of your throat. Get help right away if:  You have very bad or constant: ? Headache. ? Ear pain. ? Pain in your forehead, behind your eyes, and over your cheekbones (sinus pain). ? Chest pain.  You have long-lasting (chronic) lung disease and any of the following: ? Wheezing. ? Long-lasting cough. ? Coughing up blood. ? A change in your usual mucus.  You have a stiff neck.  You have changes in your: ? Vision. ? Hearing. ? Thinking. ? Mood. This information is not intended to replace advice given to you by your health care provider. Make sure you discuss any questions you have with your health care provider. Document Released: 10/09/2007 Document Revised: 12/24/2015 Document Reviewed: 07/28/2013 Elsevier Interactive Patient Education  2018 [employer].

## 2018-03-09 ENCOUNTER — Telehealth: Payer: Self-pay | Admitting: Emergency Medicine

## 2018-03-09 NOTE — Telephone Encounter (Signed)
Contacted patient per provider and informed her of recommendation Per patient acknowledge understanding

## 2018-03-09 NOTE — Telephone Encounter (Signed)
Left message following up on visit with [facility] 

## 2018-03-09 NOTE — Telephone Encounter (Signed)
Patient still early in course of upper respiratory infection.  Continue to recommend increase in fluids, rest, Tylenol or ibuprofen for pain, antihistamine (such as Claritin, Allegra, or Zyrtec), Flonase nasal spray (for nasal congestion, will also help with ear pain/fullness/pressure), and a decongestant (such as Sudafed, will help with sinus pressure).  If patient is concerned she has an ear infection, may return to [facility] for re-evaluation.  If patient still has symptoms in a few days, may return to [facility] for re-evaluation of possible sinus infection vs still a cold.  Thank you, SFS PA-C

## 2018-03-10 DIAGNOSIS — D485 Neoplasm of uncertain behavior of skin: Secondary | ICD-10-CM | POA: Diagnosis not present

## 2018-03-10 DIAGNOSIS — L821 Other seborrheic keratosis: Secondary | ICD-10-CM | POA: Diagnosis not present

## 2018-03-10 DIAGNOSIS — Z85828 Personal history of other malignant neoplasm of skin: Secondary | ICD-10-CM | POA: Diagnosis not present

## 2018-03-10 DIAGNOSIS — L570 Actinic keratosis: Secondary | ICD-10-CM | POA: Diagnosis not present

## 2018-03-10 DIAGNOSIS — L812 Freckles: Secondary | ICD-10-CM | POA: Diagnosis not present

## 2018-03-10 DIAGNOSIS — L820 Inflamed seborrheic keratosis: Secondary | ICD-10-CM | POA: Diagnosis not present

## 2018-03-10 DIAGNOSIS — D692 Other nonthrombocytopenic purpura: Secondary | ICD-10-CM | POA: Diagnosis not present

## 2018-03-10 DIAGNOSIS — L578 Other skin changes due to chronic exposure to nonionizing radiation: Secondary | ICD-10-CM | POA: Diagnosis not present

## 2018-03-10 DIAGNOSIS — Z1283 Encounter for screening for malignant neoplasm of skin: Secondary | ICD-10-CM | POA: Diagnosis not present

## 2018-03-27 ENCOUNTER — Ambulatory Visit (INDEPENDENT_AMBULATORY_CARE_PROVIDER_SITE_OTHER): Payer: 59 | Admitting: Physician Assistant

## 2018-03-27 ENCOUNTER — Encounter: Payer: Self-pay | Admitting: Physician Assistant

## 2018-03-27 VITALS — BP 150/100 | HR 107 | Temp 97.7°F | Wt 138.0 lb

## 2018-03-27 DIAGNOSIS — J0100 Acute maxillary sinusitis, unspecified: Secondary | ICD-10-CM

## 2018-03-27 DIAGNOSIS — S60511A Abrasion of right hand, initial encounter: Secondary | ICD-10-CM

## 2018-03-27 MED ORDER — AMOXICILLIN-POT CLAVULANATE 875-125 MG PO TABS: 1.0000 | ORAL_TABLET | Freq: Two times a day (BID) | ORAL | 0 refills | Status: AC

## 2018-03-27 MED ORDER — FLUCONAZOLE 150 MG PO TABS: 150.0000 mg | ORAL_TABLET | Freq: Once | ORAL | 0 refills | Status: AC

## 2018-03-27 NOTE — Progress Notes (Signed)
Patient ID: MARRIETTA THUNDER DOB: [birth year] AGE: 67 y.o. MRN: [id number]   PCP: Adin Hector, MD   Chief Complaint:  Chief Complaint  Patient presents with  . choice-hand rash x3wk  . Facial Pain     Subjective:    HPI:  [name] is a 67 y.o. female presents for evaluation  Chief Complaint  Patient presents with  . choice-hand rash x3wk  . Facial Pain   67 year old female presents to [facility] with three week history of sinus pressure. Began with malaise, bodyaches, bilateral ear pressure, and sore throat. Seen on day 2 of symptoms at [facility], on 03/06/2018. Diagnosed with URI. Prescribed Nasonex and Afrin nasal spray. Patient states nasal congestion and bilateral maxillary sinus pressure continued. Primary complaint, right naris swelling and soreness (states feels like a pick is being stuck up the right side of her nose). Denies epistaxis or blood streaked rhinorrhea/nasal discharge. States nasal discharge is green in color. Feels sinus pressure has worsened past three days. Denies fever, chills, headache, ear pain, cough, chest congestion, wheezing, SOB. Patient has been using Nasonex nasal spray, Afrin nasal spray, NettiPot cleanses, and OTC Sudafed.   Patient also with three week history of scratch on right hand. Dorsal aspect. Patient with 53 month old puppy at home; high energy, accidentally scratched patient's right hand. Suffered skin tear (patient states she has thin skin that tears easily). Has been applying previously prescribed mupirocin ointment. Yesterday cleaned with hydrogen peroxide. Mild soreness/tenderness. Has noticed very small surrounding circle of redness. Overlying scab. Denies hand weakness or paresthesias. Denies continued bleeding, purulent drainage, streaking redness. No bite. Puppy up to date on vaccinations including rabies. Last tetanus vaccination 01/01/2010.  A complete, at least 10 system review of symptoms was performed,  pertinent positives and negatives as mentioned in HPI, otherwise negative.  The following portions of the patient's history were reviewed and updated as appropriate: allergies, current medications and past medical history.  Patient Active Problem List   Diagnosis Date Noted  . Costochondritis 12/26/2015  . Allergic state 02/09/2015  . Adult hypothyroidism 02/09/2015  . OP (osteoporosis) 02/09/2015  . AV (anaerobic vaginosis) 11/18/2014  . Non-traumatic rotator cuff tear 09/29/2013    Allergies  Allergen Reactions  . Iodinated Diagnostic Agents Hives    Current Outpatient Medications on File Prior to Visit  Medication Sig Dispense Refill  . albuterol (PROVENTIL HFA;VENTOLIN HFA) 108 (90 Base) MCG/ACT inhaler Inhale 2 puffs into the lungs every 4 (four) hours as needed for wheezing or shortness of breath (cough, shortness of breath or wheezing.). 1 Inhaler 1  . calcium carbonate (OS-CAL) 1250 (500 Ca) MG chewable tablet Chew 1 tablet by mouth daily.    . cetirizine (ZYRTEC) 10 MG chewable tablet Chew 10 mg by mouth daily.    . cholecalciferol (VITAMIN D) 1000 units tablet Take 1,000 Units by mouth daily.    . fexofenadine (ALLEGRA) 30 MG tablet Take 30 mg by mouth 2 (two) times daily.    Marland Kitchen ipratropium (ATROVENT) 0.03 % nasal spray Place 2 sprays into both nostrils 3 (three) times daily. 30 mL 0  . mometasone (NASONEX) 50 MCG/ACT nasal spray Place 2 sprays into the nose daily. 17 g 0  . Multiple Vitamin (MULTI-VITAMINS) TABS Take by mouth.    Marland Kitchen oxymetazoline (AFRIN NASAL SPRAY) 0.05 % nasal spray Place 1 spray into both nostrils 2 (two) times daily. 30 mL 0   No current facility-administered medications on file prior  to visit.        Objective:   Vitals:   03/27/18 0822  BP: (!) 150/100  Pulse: (!) 107  Temp: 97.7 F (36.5 C)  SpO2: 98%     Wt Readings from Last 3 Encounters:  03/27/18 138 lb (62.6 kg)  03/06/18 139 lb (63 kg)  01/19/18 138 lb (62.6 kg)    Physical  Exam:   General Appearance:  Alert, cooperative, appears stated age. In no acute distress. Afebrile.  Head:  Normocephalic, without obvious abnormality, atraumatic  Eyes:  PERRL, conjunctiva/corneas clear, EOM's intact, fundi benign, both eyes  Ears:  Normal TM's and external ear canals, both ears  Nose: Nares normal, septum midline. Nasal mucosa reveals bilateral edema with clear rhinorrhea. Pain elicited with insertion of otoscope; primarily right naris. No sinus tenderness with percussion/palpation. Subjective bilateral maxillary sinus pressure/congestion.  Throat: Lips, mucosa, and tongue normal; teeth and gums normal. Throat reveals no erythema. Mild postnasal drip. Tonsils with no enlargement or exudate.  Neck: Supple, symmetrical, trachea midline, no adenopathy;  thyroid: not enlarged, symmetric, no tenderness/mass/nodules; no carotid bruit or JVD  Back:   Symmetric, no curvature, ROM normal, no CVA tenderness  Lungs:   Clear to auscultation bilaterally, respirations unlabored  Heart:  Sinus tachycardia; 107bp. S1 and S2 normal, no murmur, rub, or gallop  Extremities: Extremities normal, atraumatic, no cyanosis or edema  Pulses: 2+ and symmetric  Skin: Right hand inspection reveals 1cm circular, slightly indented, scab on dorsal aspect, between 1st and 2nd metacarpal. 0.25cm circular erythematous border; very faint. No associated edema. No palpable underlying induration or fluctuance. No active bleeding. No purulent drainage. No streaking redness. 5/5 hand grip strength. Sensation intact. Brisk capillary refill. Strong radial pulse.  Lymph nodes: Cervical, supraclavicular, and axillary nodes normal  Neurologic: Normal    Assessment & Plan:    Exam findings, diagnosis etiology and medication use and indications reviewed with patient. Follow-Up and discharge instructions provided. No emergent/urgent issues found on exam.  Patient education was provided.   Patient verbalized  understanding of information provided and agrees with plan of care (POC), all questions answered. The patient is advised to call or return to clinic if condition does not see an improvement in symptoms, or to seek the care of the closest emergency department if condition worsens with the below plan.    1. Acute non-recurrent maxillary sinusitis  - amoxicillin-clavulanate (AUGMENTIN) 875-125 MG tablet; Take 1 tablet by mouth 2 (two) times daily for 10 days.  Dispense: 20 tablet; Refill: 0 - fluconazole (DIFLUCAN) 150 MG tablet; Take 1 tablet (150 mg total) by mouth once for 1 dose.  Dispense: 1 tablet; Refill: 0  2. Scratch of hand, right, initial encounter  Patient with two complaints. (Two FRACUTE15 charges dropped)  First: 3 week history of sinus pressure/congestion and nasal congestion. Worsening over past three days. Due to duration and second illness, at this time feel antibiotic is indicated for bacterial sinusitis. Prescribed 10-day course of Augmentin. Prescribed Diflucan for possible secondary vaginal yeast infection (previous occurrence for patient). Discussed discontinuation of Afrin (rebound congestion with regular use) and discontinuation of Sudafed (elevated blood pressure). Advised nasal steroid spray and continued NettiPot usage. Instructed patient to follow-up in 4-5 days if not improving.  Second: Patient with three week old scratch on dorsal aspect of right hand. Healing well, just slowly. No indication of cellulitis at this time. Augmentin (prescribed for sinusitis) will have some cellulitis coverage, regardless. Discussed wound care. Continued use of mupirocin.  Advised re-evaluation of wound if wound does not continue to gradually heal.   [name], MHS, PA-C [name], MHS, PA-C Advanced Practice Provider [facility]  [address], [facility], [place], [place] [address] (p):   [phone] [email or web address]

## 2018-03-27 NOTE — Patient Instructions (Signed)
Thank you for choosing [facility] for your health care needs.  You have been diagnosed with sinusitis.  You have been prescribed an antibiotic, Augmentin. Take 1 pill by mouth twice a day x 10 days. Take with food to prevent stomach upset. Eat yogurt or take an over the counter probiotic.  Increase fluids. Rest. Continue to use Flonase or Nasonex nasal spray. Discontinue use of Afrin nasal spray. Continue to use decongestant, such as Sudafed (be careful, may raise blood pressure). If blood pressure elevated, use over the counter Coricidin-HBP.  In regards to right hand wound, Clean wound twice a day with normal soap and water. May continue to apply thin layer of mupirocin ointment, no more than twice a day. Keep wound uncovered.  Return to [facility] or follow-up with family physician in 4-5 days if sinuses not improving or if hand wound worsens/changes.   Sinusitis, Adult Sinusitis is soreness and inflammation of your sinuses. Sinuses are hollow spaces in the bones around your face. They are located:  Around your eyes.  In the middle of your forehead.  Behind your nose.  In your cheekbones.  Your sinuses and nasal passages are lined with a stringy fluid (mucus). Mucus normally drains out of your sinuses. When your nasal tissues get inflamed or swollen, the mucus can get trapped or blocked so air cannot flow through your sinuses. This lets bacteria, viruses, and funguses grow, and that leads to infection. Follow these instructions at home: Medicines  Take, use, or apply over-the-counter and prescription medicines only as told by your doctor. These may include nasal sprays.  If you were prescribed an antibiotic medicine, take it as told by your doctor. Do not stop taking the antibiotic even if you start to feel better. Hydrate and Humidify  Drink enough water to keep your pee (urine) clear or pale yellow.  Use a cool mist humidifier to keep the humidity level in your home  above 50%.  Breathe in steam for 10-15 minutes, 3-4 times a day or as told by your doctor. You can do this in the bathroom while a hot shower is running.  Try not to spend time in cool or dry air. Rest  Rest as much as possible.  Sleep with your head raised (elevated).  Make sure to get enough sleep each night. General instructions  Put a warm, moist washcloth on your face 3-4 times a day or as told by your doctor. This will help with discomfort.  Wash your hands often with soap and water. If there is no soap and water, use hand sanitizer.  Do not smoke. Avoid being around people who are smoking (secondhand smoke).  Keep all follow-up visits as told by your doctor. This is important. Contact a doctor if:  You have a fever.  Your symptoms get worse.  Your symptoms do not get better within 10 days. Get help right away if:  You have a very bad headache.  You cannot stop throwing up (vomiting).  You have pain or swelling around your face or eyes.  You have trouble seeing.  You feel confused.  Your neck is stiff.  You have trouble breathing. This information is not intended to replace advice given to you by your health care provider. Make sure you discuss any questions you have with your health care provider. Document Released: 10/09/2007 Document Revised: 12/17/2015 Document Reviewed: 02/15/2015 Elsevier Interactive Patient Education  [employer].

## 2018-03-30 ENCOUNTER — Telehealth: Payer: Self-pay | Admitting: Emergency Medicine

## 2018-03-30 NOTE — Telephone Encounter (Signed)
Spoke with patient whom informed me that she is feeling so much better.

## 2018-04-08 DIAGNOSIS — Z01419 Encounter for gynecological examination (general) (routine) without abnormal findings: Secondary | ICD-10-CM | POA: Diagnosis not present

## 2018-04-08 DIAGNOSIS — Z1211 Encounter for screening for malignant neoplasm of colon: Secondary | ICD-10-CM | POA: Diagnosis not present

## 2018-04-08 DIAGNOSIS — Z1239 Encounter for other screening for malignant neoplasm of breast: Secondary | ICD-10-CM | POA: Diagnosis not present

## 2018-04-21 DIAGNOSIS — Z1211 Encounter for screening for malignant neoplasm of colon: Secondary | ICD-10-CM | POA: Diagnosis not present

## 2018-04-22 ENCOUNTER — Emergency Department: Payer: 59

## 2018-04-22 ENCOUNTER — Other Ambulatory Visit: Payer: Self-pay

## 2018-04-22 ENCOUNTER — Observation Stay
Admission: EM | Admit: 2018-04-22 | Discharge: 2018-04-23 | Disposition: A | Discharge status: Home / Self Care | Payer: 59 | Attending: Internal Medicine | Admitting: Internal Medicine

## 2018-04-22 ENCOUNTER — Encounter: Payer: Self-pay | Admitting: Emergency Medicine

## 2018-04-22 DIAGNOSIS — Z79899 Other long term (current) drug therapy: Secondary | ICD-10-CM | POA: Insufficient documentation

## 2018-04-22 DIAGNOSIS — R42 Dizziness and giddiness: Secondary | ICD-10-CM | POA: Diagnosis not present

## 2018-04-22 DIAGNOSIS — R1110 Vomiting, unspecified: Secondary | ICD-10-CM | POA: Diagnosis not present

## 2018-04-22 DIAGNOSIS — E876 Hypokalemia: Secondary | ICD-10-CM | POA: Diagnosis not present

## 2018-04-22 DIAGNOSIS — R4020 Unspecified coma: Secondary | ICD-10-CM | POA: Diagnosis not present

## 2018-04-22 DIAGNOSIS — R55 Syncope and collapse: Secondary | ICD-10-CM | POA: Diagnosis present

## 2018-04-22 DIAGNOSIS — R112 Nausea with vomiting, unspecified: Secondary | ICD-10-CM | POA: Insufficient documentation

## 2018-04-22 LAB — CBC WITH DIFFERENTIAL/PLATELET
ABS IMMATURE GRANULOCYTES: 0.03 10*3/uL (ref 0.00–0.07)
BASOS PCT: 1 %
Basophils Absolute: 0.1 10*3/uL (ref 0.0–0.1)
EOS ABS: 0.1 10*3/uL (ref 0.0–0.5)
Eosinophils Relative: 1 %
HCT: 42.2 % (ref 36.0–46.0)
Hemoglobin: 14.5 g/dL (ref 12.0–15.0)
IMMATURE GRANULOCYTES: 0 %
Lymphocytes Relative: 32 %
Lymphs Abs: 2.3 10*3/uL (ref 0.7–4.0)
MCH: 31.9 pg (ref 26.0–34.0)
MCHC: 34.4 g/dL (ref 30.0–36.0)
MCV: 92.7 fL (ref 80.0–100.0)
MONO ABS: 0.6 10*3/uL (ref 0.1–1.0)
Monocytes Relative: 8 %
NEUTROS ABS: 4.3 10*3/uL (ref 1.7–7.7)
Neutrophils Relative %: 58 %
PLATELETS: 237 10*3/uL (ref 150–400)
RBC: 4.55 MIL/uL (ref 3.87–5.11)
RDW: 11.8 % (ref 11.5–15.5)
WBC: 7.3 10*3/uL (ref 4.0–10.5)
nRBC: 0 % (ref 0.0–0.2)

## 2018-04-22 LAB — URINALYSIS, COMPLETE (UACMP) WITH MICROSCOPIC
Bacteria, UA: NONE SEEN
Bilirubin Urine: NEGATIVE
GLUCOSE, UA: NEGATIVE mg/dL
HGB URINE DIPSTICK: NEGATIVE
KETONES UR: 20 mg/dL — AB
Leukocytes, UA: NEGATIVE
Nitrite: NEGATIVE
PH: 8 (ref 5.0–8.0)
PROTEIN: NEGATIVE mg/dL
Specific Gravity, Urine: 1.005 (ref 1.005–1.030)

## 2018-04-22 LAB — COMPREHENSIVE METABOLIC PANEL
ALT: 22 U/L (ref 0–44)
AST: 29 U/L (ref 15–41)
Albumin: 4.9 g/dL (ref 3.5–5.0)
Alkaline Phosphatase: 59 U/L (ref 38–126)
Anion gap: 9 (ref 5–15)
BUN: 18 mg/dL (ref 8–23)
CO2: 25 mmol/L (ref 22–32)
CREATININE: 0.87 mg/dL (ref 0.44–1.00)
Calcium: 10 mg/dL (ref 8.9–10.3)
Chloride: 103 mmol/L (ref 98–111)
Glucose, Bld: 102 mg/dL — ABNORMAL HIGH (ref 70–99)
Potassium: 3.5 mmol/L (ref 3.5–5.1)
SODIUM: 137 mmol/L (ref 135–145)
Total Bilirubin: 0.8 mg/dL (ref 0.3–1.2)
Total Protein: 7.7 g/dL (ref 6.5–8.1)

## 2018-04-22 LAB — TROPONIN I: Troponin I: <0.03 ng/mL (ref <0.03)

## 2018-04-22 LAB — GLUCOSE, CAPILLARY: Glucose-Capillary: 91 mg/dL (ref 70–99)

## 2018-04-22 MED ORDER — IPRATROPIUM BROMIDE 0.03 % NA SOLN
2.0000 | NASAL | Status: DC | PRN
  Filled 2018-04-22: qty 30

## 2018-04-22 MED ORDER — SODIUM CHLORIDE 0.9 % IV SOLN
INTRAVENOUS | Status: DC
  Administered 2018-04-22: 23:00:00 via INTRAVENOUS

## 2018-04-22 MED ORDER — PNEUMOCOCCAL VAC POLYVALENT 25 MCG/0.5ML IJ INJ: 0.5000 mL | INJECTION | INTRAMUSCULAR | Status: DC

## 2018-04-22 MED ORDER — DIAZEPAM 5 MG PO TABS
5.0000 mg | ORAL_TABLET | Freq: Once | ORAL | Status: AC
  Administered 2018-04-22: 5 mg via ORAL
  Filled 2018-04-22: qty 1

## 2018-04-22 MED ORDER — LORAZEPAM 2 MG/ML IJ SOLN
0.5000 mg | Freq: Once | INTRAMUSCULAR | Status: AC
  Administered 2018-04-22: 0.5 mg via INTRAVENOUS
  Filled 2018-04-22: qty 1

## 2018-04-22 MED ORDER — MECLIZINE HCL 25 MG PO TABS
25.0000 mg | ORAL_TABLET | Freq: Three times a day (TID) | ORAL | Status: DC | PRN
  Administered 2018-04-23: 25 mg via ORAL
  Filled 2018-04-22 (×2): qty 1

## 2018-04-22 MED ORDER — ONDANSETRON HCL 4 MG/2ML IJ SOLN
4.0000 mg | Freq: Once | INTRAMUSCULAR | Status: AC
  Administered 2018-04-22: 4 mg via INTRAVENOUS
  Filled 2018-04-22: qty 2

## 2018-04-22 MED ORDER — ENOXAPARIN SODIUM 40 MG/0.4ML ~~LOC~~ SOLN
40.0000 mg | SUBCUTANEOUS | Status: DC
  Administered 2018-04-22: 40 mg via SUBCUTANEOUS
  Filled 2018-04-22: qty 0.4

## 2018-04-22 MED ORDER — CALCIUM CARBONATE-VITAMIN D 500-200 MG-UNIT PO TABS
3.0000 | ORAL_TABLET | Freq: Every day | ORAL | Status: DC
  Administered 2018-04-23: 3 via ORAL
  Filled 2018-04-22: qty 3

## 2018-04-22 MED ORDER — OXYMETAZOLINE HCL 0.05 % NA SOLN
1.0000 | NASAL | Status: DC | PRN
  Filled 2018-04-22: qty 15

## 2018-04-22 MED ORDER — ONDANSETRON HCL 4 MG PO TABS: 4.0000 mg | ORAL_TABLET | Freq: Four times a day (QID) | ORAL | Status: DC | PRN

## 2018-04-22 MED ORDER — LORAZEPAM 2 MG/ML IJ SOLN: 1.0000 mg | Freq: Once | INTRAMUSCULAR | Status: DC

## 2018-04-22 MED ORDER — MECLIZINE HCL 25 MG PO TABS
50.0000 mg | ORAL_TABLET | Freq: Once | ORAL | Status: AC
  Administered 2018-04-22: 50 mg via ORAL
  Filled 2018-04-22: qty 2

## 2018-04-22 MED ORDER — ALBUTEROL SULFATE (2.5 MG/3ML) 0.083% IN NEBU: 2.5000 mg | INHALATION_SOLUTION | RESPIRATORY_TRACT | Status: DC | PRN

## 2018-04-22 MED ORDER — SENNOSIDES-DOCUSATE SODIUM 8.6-50 MG PO TABS: 1.0000 | ORAL_TABLET | Freq: Every evening | ORAL | Status: DC | PRN

## 2018-04-22 MED ORDER — ACETAMINOPHEN 650 MG RE SUPP: 650.0000 mg | Freq: Four times a day (QID) | RECTAL | Status: DC | PRN

## 2018-04-22 MED ORDER — LORATADINE 10 MG PO TABS
10.0000 mg | ORAL_TABLET | Freq: Every day | ORAL | Status: DC
  Administered 2018-04-23: 10 mg via ORAL
  Filled 2018-04-22: qty 1

## 2018-04-22 MED ORDER — ONDANSETRON HCL 4 MG/2ML IJ SOLN: 4.0000 mg | Freq: Four times a day (QID) | INTRAMUSCULAR | Status: DC | PRN

## 2018-04-22 MED ORDER — SODIUM CHLORIDE 0.9 % IV SOLN
1000.0000 mL | Freq: Once | INTRAVENOUS | Status: AC
  Administered 2018-04-22: 1000 mL via INTRAVENOUS

## 2018-04-22 MED ORDER — ACETAMINOPHEN 325 MG PO TABS: 650.0000 mg | ORAL_TABLET | Freq: Four times a day (QID) | ORAL | Status: DC | PRN

## 2018-04-22 MED ORDER — VITAMIN D3 25 MCG (1000 UNIT) PO TABS
1000.0000 [IU] | ORAL_TABLET | Freq: Every day | ORAL | Status: DC
  Administered 2018-04-23: 1000 [IU] via ORAL
  Filled 2018-04-22: qty 1

## 2018-04-22 MED ORDER — FLUTICASONE PROPIONATE 50 MCG/ACT NA SUSP
1.0000 | NASAL | Status: DC | PRN
  Filled 2018-04-22: qty 16

## 2018-04-22 NOTE — ED Provider Notes (Signed)
[facility] Emergency Department Provider Note       Time seen: ----------------------------------------- 2:34 PM on 04/22/2018 -----------------------------------------   I have reviewed the triage vital signs and the nursing notes.  HISTORY   Chief Complaint Loss of Consciousness    HPI [name] is a 67 y.o. female with a history of hypothyroidism, costochondritis who presents to the ED for near syncope.  Patient states she was working, was feeling lightheaded.  She then became diaphoretic and vomited.  She has not had this happen previously except once decades ago.  She denies any recent illness or other complaints.  No past medical history on file.  Patient Active Problem List   Diagnosis Date Noted  . Costochondritis 12/26/2015  . Allergic state 02/09/2015  . Adult hypothyroidism 02/09/2015  . OP (osteoporosis) 02/09/2015  . AV (anaerobic vaginosis) 11/18/2014  . Non-traumatic rotator cuff tear 09/29/2013    Past Surgical History:  Procedure Laterality Date  . KNEE SURGERY    . TOTAL VAGINAL HYSTERECTOMY      Allergies Iodinated diagnostic agents  Social History Social History   Tobacco Use  . Smoking status: Never Smoker  . Smokeless tobacco: Never Used  Substance Use Topics  . Alcohol use: No    Alcohol/week: 0.0 standard drinks  . Drug use: No   Review of Systems Constitutional: Negative for fever. Cardiovascular: Negative for chest pain. Respiratory: Negative for shortness of breath. Gastrointestinal: Negative for abdominal pain, positive for vomiting Musculoskeletal: Negative for back pain. Skin: Positive for diaphoresis Neurological: Negative for headaches, positive for weakness  All systems negative/normal/unremarkable except as stated in the HPI  ____________________________________________   PHYSICAL EXAM:  VITAL SIGNS: ED Triage Vitals  Enc Vitals Group     BP      Pulse      Resp      Temp    Temp src      SpO2      Weight      Height      Head Circumference      Peak Flow      Pain Score      Pain Loc      Pain Edu?      Excl. in [place]?    Constitutional: Alert and oriented.  Mild distress Eyes: Conjunctivae are normal.  Seidel nystagmus is noted ENT   Head: Normocephalic and atraumatic.   Nose: No congestion/rhinnorhea.   Mouth/Throat: Mucous membranes are moist.   Neck: No stridor. Cardiovascular: Normal rate, regular rhythm. No murmurs, rubs, or gallops. Respiratory: Normal respiratory effort without tachypnea nor retractions. Breath sounds are clear and equal bilaterally. No wheezes/rales/rhonchi. Gastrointestinal: Soft and nontender. Normal bowel sounds Musculoskeletal: Nontender with normal range of motion in extremities. No lower extremity tenderness nor edema. Neurologic:  Normal speech and language. No gross focal neurologic deficits are appreciated.  Skin:  Skin is warm, dry and intact. No rash noted. Psychiatric: Mood and affect are normal. Speech and behavior are normal.  ____________________________________________  EKG: Interpreted by me.  Sinus rhythm the rate of 77 bpm, normal PR interval, normal QRS, normal QT  ____________________________________________  ED COURSE:  As part of my medical decision making, I reviewed the following data within the [place] History obtained from family if available, nursing notes, old chart and ekg, as well as notes from prior ED visits. Patient presented for diaphoresis and near syncope with vomiting, we will assess with labs and imaging as indicated at  this time.   Procedures ____________________________________________   LABS (pertinent positives/negatives)  Labs Reviewed  COMPREHENSIVE METABOLIC PANEL - Abnormal; Notable for the following components:      Result Value   Glucose, Bld 102 (*)    All other components within normal limits  CBC WITH DIFFERENTIAL/PLATELET  TROPONIN I   GLUCOSE, CAPILLARY  URINALYSIS, COMPLETE (UACMP) WITH MICROSCOPIC  CBG MONITORING, ED   ____________________________________________  DIFFERENTIAL DIAGNOSIS   Orthostatic hypotension, arrhythmia, MI, gastroenteritis, dehydration, electrolyte abnormality  FINAL ASSESSMENT AND PLAN  Near syncope, vertigo, vomiting   Plan: The patient had presented for near syncope and vomiting. Patient's labs are reassuring. Patient's imaging are still pending.  Initially she is presented more with a near syncopal type presentation, now she seemed to be having vertigo with room spinning sensation and nystagmus.  Have given meclizine and Valium.   Laurence Aly, MD   Note: This note was generated in part or whole with voice recognition software. Voice recognition is usually quite accurate but there are transcription errors that can and very often do occur. I apologize for any typographical errors that were not detected and corrected.     Earleen Newport, MD 04/22/18 [phone]

## 2018-04-22 NOTE — ED Provider Notes (Signed)
Ct Head Wo Contrast  Result Date: 04/22/2018 CLINICAL DATA:  Altered level of consciousness. EXAM: CT HEAD WITHOUT CONTRAST TECHNIQUE: Contiguous axial images were obtained from the base of the skull through the vertex without intravenous contrast. COMPARISON:  MRI head 11/30/2008 FINDINGS: Brain: No evidence of acute infarction, hemorrhage, hydrocephalus, extra-axial collection or mass lesion/mass effect. Vascular: Negative for hyperdense vessel Skull: Negative Sinuses/Orbits: Negative Other: None IMPRESSION: Negative CT head Electronically Signed   By: Franchot Gallo M.D.   On: 04/22/2018 17:20     CT head reviewed negative.-----------------------------------------  5:42 PM on 04/22/2018  -----------------------------------------   Performed VAN assessment, negative for LVO.  Patient has normal strength and sensation in all extremities.  Cranial nerve exam is normal except for non-extinguishing nystagmus that seems to be present in multiple planes both vertical and lateral.  She gets quite nauseated with opening of eyes.  Will give additional Zofran, does report symptoms are slightly better after Valium and meclizine but I am concerned about the persistence of this.  I ordered a stat MRI to further evaluate for any evidence of a possible central process including possibility of stroke though with her reassuring.  Ct Head Wo Contrast  Result Date: 04/22/2018 CLINICAL DATA:  Altered level of consciousness. EXAM: CT HEAD WITHOUT CONTRAST TECHNIQUE: Contiguous axial images were obtained from the base of the skull through the vertex without intravenous contrast. COMPARISON:  MRI head 11/30/2008 FINDINGS: Brain: No evidence of acute infarction, hemorrhage, hydrocephalus, extra-axial collection or mass lesion/mass effect. Vascular: Negative for hyperdense vessel Skull: Negative Sinuses/Orbits: Negative Other: None IMPRESSION: Negative CT head Electronically Signed   By: Franchot Gallo M.D.   On:  04/22/2018 17:20   Mr Brain Wo Contrast  Result Date: 04/22/2018 CLINICAL DATA:  Lightheadedness, vomiting.  Evaluate vertigo. EXAM: MRI HEAD WITHOUT CONTRAST TECHNIQUE: Axial and coronal diffusion weighted imaging obtained. Per technologist note, scanner malfunction resulted in premature termination of examination. COMPARISON:  CT HEAD April 22, 2018 no MRI head November 30, 2008 FINDINGS: Brain: No reduced diffusion to suggest acute ischemia, hyperacute demyelination, status epilepticus or hypercellular tumor. No midline shift, mass effect. No hydrocephalus. Vascular: Nondiagnostic assessment. Skull and upper cervical spine: No reduced diffusion to suggest hypercellular tumor. Sinuses/Orbits: Nondiagnostic assessment. Other: Not applicable. IMPRESSION: 1. Limited 2 sequence MRI head: No acute intracranial process. Electronically Signed   By: Elon Alas M.D.   On: 04/22/2018 20:31      MRI reviewed, no evidence of acute somewhat limited study.  ----------------------------------------- 9:04 PM on 04/22/2018 -----------------------------------------  Patient remains with some nausea.  Still has some nystagmus.  Suspect severe peripheral vertigo given the reassuring MRI at this time, discussed with patient will admit as she is continued to have nausea and vertigo the point she is unable to walk.  Discussed with the hospitalist.  Anticipate likely need for ENT consult as well tomorrow.     Delman Kitten, MD 04/22/18 2104

## 2018-04-22 NOTE — ED Notes (Signed)
Patient transported to CT 

## 2018-04-22 NOTE — H&P (Signed)
[facility] [place] NAME: Roselia Snipe    MR#:  [id number]  DATE OF BIRTH:  [birth year]  DATE OF ADMISSION:  04/22/2018  PRIMARY CARE PHYSICIAN: Adin Hector, MD   REQUESTING/REFERRING PHYSICIAN:   CHIEF COMPLAINT:   Chief Complaint  Patient presents with  . Loss of Consciousness    HISTORY OF PRESENT ILLNESS: Corrinna Karapetyan  is a 67 y.o. female with no significant past medical history presented to the emergency room for dizziness about to pass out.  She has objects spinning around in the room.  Patient has severe vertigo and nausea.  She was about to pass out.  She was evaluated in the emergency room with a CT head which showed no acute abnormality.  Patient was also worked up with MRI brain which showed no acute abnormality. symptoms did not improve.  Hospitalist service was consulted.  PAST MEDICAL HISTORY: No history of diabetes, heart disease  PAST SURGICAL HISTORY:  Past Surgical History:  Procedure Laterality Date  . KNEE SURGERY    . TOTAL VAGINAL HYSTERECTOMY      SOCIAL HISTORY:  Social History   Tobacco Use  . Smoking status: Never Smoker  . Smokeless tobacco: Never Used  Substance Use Topics  . Alcohol use: No    Alcohol/week: 0.0 standard drinks    FAMILY HISTORY:  Family History  Problem Relation Age of Onset  . Lung cancer Father   . Breast cancer Neg Hx     DRUG ALLERGIES:  Allergies  Allergen Reactions  . Iodinated Diagnostic Agents Hives    REVIEW OF SYSTEMS:   CONSTITUTIONAL: No fever, fatigue or weakness.  EYES: No blurred or double vision.  EARS, NOSE, AND THROAT: No tinnitus or ear pain.  RESPIRATORY: No cough, shortness of breath, wheezing or hemoptysis.  CARDIOVASCULAR: No chest pain, orthopnea, edema.  GASTROINTESTINAL: Has nausea, vomiting,  No diarrhea or abdominal pain.  GENITOURINARY: No dysuria, hematuria.  ENDOCRINE: No polyuria, nocturia,  HEMATOLOGY: No anemia, easy  bruising or bleeding SKIN: No rash or lesion. MUSCULOSKELETAL: No joint pain or arthritis.   NEUROLOGIC: No tingling, numbness, weakness.  Has dizziness PSYCHIATRY: No anxiety or depression.   MEDICATIONS AT HOME:  Prior to Admission medications   Medication Sig Start Date End Date Taking? Authorizing Provider  albuterol (PROVENTIL HFA;VENTOLIN HFA) 108 (90 Base) MCG/ACT inhaler Inhale 2 puffs into the lungs every 4 (four) hours as needed for wheezing or shortness of breath (cough, shortness of breath or wheezing.). 01/19/18  Yes Scot Jun, FNP  calcium carbonate (OS-CAL) 1250 (500 Ca) MG chewable tablet Chew 1 tablet by mouth daily.   Yes [provider]  cetirizine (ZYRTEC) 10 MG chewable tablet Chew 10 mg by mouth daily.   Yes [provider]  cholecalciferol (VITAMIN D) 1000 units tablet Take 1,000 Units by mouth daily.   Yes [provider]  fexofenadine (ALLEGRA) 30 MG tablet Take 30 mg by mouth daily.    Yes [provider]  ipratropium (ATROVENT) 0.03 % nasal spray Place 2 sprays into both nostrils 3 (three) times daily. 12/31/17  Yes Scot Jun, FNP  mometasone (NASONEX) 50 MCG/ACT nasal spray Place 2 sprays into the nose daily. 03/06/18  Yes [name], PA-C  Multiple Vitamin (MULTI-VITAMINS) TABS Take by mouth.   Yes [provider]  oxymetazoline (AFRIN NASAL SPRAY) 0.05 % nasal spray Place 1 spray into both nostrils 2 (two) times daily. 03/06/18  Yes [name], PA-C      PHYSICAL EXAMINATION:   VITAL SIGNS: Blood pressure (!) 179/96, pulse 80, resp. rate 13, height 5\' 3"  (1.6 m), weight 61.2 kg, SpO2 (!) 88 %.  GENERAL:  67 y.o.-year-old patient lying in the bed with no acute distress.  EYES: Pupils equal, round, reactive to light and accommodation. No scleral icterus. Extraocular muscles intact.  HEENT: Head atraumatic, normocephalic. Oropharynx dry and nasopharynx clear.  Nystagmus present NECK:   Supple, no jugular venous distention. No thyroid enlargement, no tenderness.  LUNGS: Normal breath sounds bilaterally, no wheezing, rales,rhonchi or crepitation. No use of accessory muscles of respiration.  CARDIOVASCULAR: S1, S2 normal. No murmurs, rubs, or gallops.  ABDOMEN: Soft, nontender, nondistended. Bowel sounds present. No organomegaly or mass.  EXTREMITIES: No pedal edema, cyanosis, or clubbing.  NEUROLOGIC: Cranial nerves II through XII are intact. Muscle strength 5/5 in all extremities. Sensation intact. Gait not checked.  PSYCHIATRIC: The patient is alert and oriented x 3.  SKIN: No obvious rash, lesion, or ulcer.   LABORATORY PANEL:   CBC Recent Labs  Lab 04/22/18 1441  WBC 7.3  HGB 14.5  HCT 42.2  PLT 237  MCV 92.7  MCH 31.9  MCHC 34.4  RDW 11.8  LYMPHSABS 2.3  MONOABS 0.6  EOSABS 0.1  BASOSABS 0.1   ------------------------------------------------------------------------------------------------------------------  Chemistries  Recent Labs  Lab 04/22/18 1441  NA 137  K 3.5  CL 103  CO2 25  GLUCOSE 102*  BUN 18  CREATININE 0.87  CALCIUM 10.0  AST 29  ALT 22  ALKPHOS 59  BILITOT 0.8   ------------------------------------------------------------------------------------------------------------------ estimated creatinine clearance is 50.5 mL/min (by C-G formula based on SCr of 0.87 mg/dL). ------------------------------------------------------------------------------------------------------------------ No results for input(s): TSH, T4TOTAL, T3FREE, THYROIDAB in the last 72 hours.  Invalid input(s): FREET3   Coagulation profile No results for input(s): INR, PROTIME in the last 168 hours. ------------------------------------------------------------------------------------------------------------------- No results for input(s): DDIMER in the last 72  hours. -------------------------------------------------------------------------------------------------------------------  Cardiac Enzymes Recent Labs  Lab 04/22/18 1441  TROPONINI <0.03   ------------------------------------------------------------------------------------------------------------------ Invalid input(s): POCBNP  ---------------------------------------------------------------------------------------------------------------  Urinalysis    Component Value Date/Time   COLORURINE COLORLESS (A) 04/22/2018 1813   APPEARANCEUR CLEAR (A) 04/22/2018 1813   LABSPEC 1.005 04/22/2018 1813   PHURINE 8.0 04/22/2018 1813   GLUCOSEU NEGATIVE 04/22/2018 1813   HGBUR NEGATIVE 04/22/2018 1813   BILIRUBINUR NEGATIVE 04/22/2018 1813   BILIRUBINUR neg 07/01/2017 1354   KETONESUR 20 (A) 04/22/2018 1813   PROTEINUR NEGATIVE 04/22/2018 1813   UROBILINOGEN 0.2 07/01/2017 1354   NITRITE NEGATIVE 04/22/2018 1813   LEUKOCYTESUR NEGATIVE 04/22/2018 1813     RADIOLOGY: Ct Head Wo Contrast  Result Date: 04/22/2018 CLINICAL DATA:  Altered level of consciousness. EXAM: CT HEAD WITHOUT CONTRAST TECHNIQUE: Contiguous axial images were obtained from the base of the skull through the vertex without intravenous contrast. COMPARISON:  MRI head 11/30/2008 FINDINGS: Brain: No evidence of acute infarction, hemorrhage, hydrocephalus, extra-axial collection or mass lesion/mass effect. Vascular: Negative for hyperdense vessel Skull: Negative Sinuses/Orbits: Negative Other: None IMPRESSION: Negative CT head Electronically Signed   By: Franchot Gallo M.D.   On: 04/22/2018 17:20   Mr Brain Wo Contrast  Result Date: 04/22/2018 CLINICAL DATA:  Lightheadedness, vomiting.  Evaluate vertigo. EXAM: MRI HEAD WITHOUT CONTRAST TECHNIQUE: Axial and coronal diffusion weighted imaging obtained. Per technologist note, scanner malfunction resulted in premature termination of examination. COMPARISON:  CT HEAD April 22, 2018 no MRI head November 30, 2008 FINDINGS: Brain: No reduced  diffusion to suggest acute ischemia, hyperacute demyelination, status epilepticus or hypercellular tumor. No midline shift, mass effect. No hydrocephalus. Vascular: Nondiagnostic assessment. Skull and upper cervical spine: No reduced diffusion to suggest hypercellular tumor. Sinuses/Orbits: Nondiagnostic assessment. Other: Not applicable. IMPRESSION: 1. Limited 2 sequence MRI head: No acute intracranial process. Electronically Signed   By: Elon Alas M.D.   On: 04/22/2018 20:31    EKG: Orders placed or performed during the hospital encounter of 04/22/18  . EKG 12-Lead  . EKG 12-Lead    IMPRESSION AND PLAN:  67 year old female patient presented to the emergency room with nausea severe dizziness and about to pass out  -Near-syncope Admit patient to telemetry observation bed IV fluids Cycle troponin Check echocardiogram, carotid ultrasound Cardiac monitoring  -Severe vertigo Oral meclizine 25 mg every 8 hourly as needed for dizziness ENT consult  -Intractable nausea vomiting Antiemetics intravenously  -DVT prophylaxis subcu Lovenox daily  All the records are reviewed and case discussed with ED provider. Management plans discussed with the patient, family and they are in agreement.  CODE STATUS:Full code    TOTAL TIME TAKING CARE OF THIS PATIENT: 53 minutes.    Saundra Shelling M.D on 04/22/2018 at 9:31 PM  Between 7am to 6pm - Pager - [phone]  After 6pm go to [email or web address] - password [email or web address] [facility]  [facility]  Office  [phone]  CC: Primary care physician; Adin Hector, MD

## 2018-04-22 NOTE — ED Triage Notes (Signed)
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 Patient now complaining of nausea and hot flashes. Patient brought to ED on stretcher accompanied by cancer center staff. Patient alert and oriented upon arrival.

## 2018-04-22 NOTE — ED Notes (Signed)
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 Pt states she felt dizzy and nauseated immediately prior to losing consciousness. Her LoC was witnessed by nurses in the [employer]. Initial BP was 186/102, pt vomited 3 times on the way to the ED. Pt is A&O x4 but keeps repeating that she feels "so sick". Pt denies taking any medications, any chronic medical Hx. Pt states that she felt well and healthy when she woke up this morning. Upon exam, this nurse noted significant nystagmus during neuro exam. EDP notified.

## 2018-04-22 NOTE — ED Notes (Signed)
Pt had syncopal episode while at work just PTA. Pt was sitting down in chair when she felt dizzy and then had witnessed syncopal episode by staff. Arrived to ER on stretcher from her work. Pt reports recent increase in stress at work and in personal life. EDP has been in to see patient on initial arrival. No injuries reported from syncope.

## 2018-04-22 NOTE — ED Notes (Signed)
Patient transported to MRI 

## 2018-04-22 NOTE — Progress Notes (Signed)
Advanced care plan.  Purpose of the Encounter: CODE STATUS  Parties in Attendance: Patient  Patient's Decision Capacity: Good  Subjective/Patient's story: Presented to emergency room for near syncope Also has severe vertigo   Objective/Medical story Needs cardiac evaluation as well as ENT evaluation IV fluids Antiemetics  Goals of care determination:  Advance care directives goals of care and treatment plan discussed Patient wants everything done which includes CPR, intubation and ventilator if the need arises   CODE STATUS: Full code   Time spent discussing advanced care planning: 16 minutes

## 2018-04-23 ENCOUNTER — Observation Stay
Admit: 2018-04-23 | Discharge: 2018-04-23 | Disposition: A | Discharge status: Home / Self Care | Payer: 59 | Attending: Internal Medicine | Admitting: Internal Medicine

## 2018-04-23 ENCOUNTER — Observation Stay: Payer: 59

## 2018-04-23 DIAGNOSIS — R55 Syncope and collapse: Secondary | ICD-10-CM | POA: Diagnosis not present

## 2018-04-23 DIAGNOSIS — R112 Nausea with vomiting, unspecified: Secondary | ICD-10-CM | POA: Diagnosis not present

## 2018-04-23 DIAGNOSIS — Z79899 Other long term (current) drug therapy: Secondary | ICD-10-CM | POA: Diagnosis not present

## 2018-04-23 DIAGNOSIS — R42 Dizziness and giddiness: Secondary | ICD-10-CM | POA: Diagnosis not present

## 2018-04-23 DIAGNOSIS — E876 Hypokalemia: Secondary | ICD-10-CM | POA: Diagnosis not present

## 2018-04-23 LAB — BASIC METABOLIC PANEL
Anion gap: 8 (ref 5–15)
BUN: 12 mg/dL (ref 8–23)
CO2: 23 mmol/L (ref 22–32)
Calcium: 8.9 mg/dL (ref 8.9–10.3)
Chloride: 107 mmol/L (ref 98–111)
Creatinine, Ser: 0.64 mg/dL (ref 0.44–1.00)
Glucose, Bld: 87 mg/dL (ref 70–99)
Potassium: 3.3 mmol/L — ABNORMAL LOW (ref 3.5–5.1)
Sodium: 138 mmol/L (ref 135–145)

## 2018-04-23 LAB — TROPONIN I
Troponin I: <0.03 ng/mL (ref <0.03)
Troponin I: <0.03 ng/mL (ref <0.03)

## 2018-04-23 LAB — ECHOCARDIOGRAM COMPLETE
Height: 64 in
Weight: 2169.6 oz

## 2018-04-23 MED ORDER — PREDNISONE 10 MG (21) PO TBPK: 10.0000 mg | ORAL_TABLET | Freq: Three times a day (TID) | ORAL | Status: DC

## 2018-04-23 MED ORDER — PREDNISONE 50 MG PO TABS
60.0000 mg | ORAL_TABLET | Freq: Once | ORAL | Status: AC
  Administered 2018-04-23: 60 mg via ORAL
  Filled 2018-04-23: qty 1

## 2018-04-23 MED ORDER — PREDNISONE 10 MG (21) PO TBPK
20.0000 mg | ORAL_TABLET | Freq: Every morning | ORAL | Status: DC
  Filled 2018-04-23: qty 21

## 2018-04-23 MED ORDER — PREDNISONE 10 MG (21) PO TBPK: 20.0000 mg | ORAL_TABLET | Freq: Every evening | ORAL | Status: DC

## 2018-04-23 MED ORDER — MECLIZINE HCL 25 MG PO TABS: 25.0000 mg | ORAL_TABLET | Freq: Three times a day (TID) | ORAL | 0 refills | Status: DC | PRN

## 2018-04-23 MED ORDER — PREDNISONE 10 MG (21) PO TBPK: 10.0000 mg | ORAL_TABLET | ORAL | Status: DC

## 2018-04-23 MED ORDER — PREDNISONE 10 MG (21) PO TBPK: 10.0000 mg | ORAL_TABLET | Freq: Four times a day (QID) | ORAL | Status: DC

## 2018-04-23 MED ORDER — POTASSIUM CHLORIDE CRYS ER 20 MEQ PO TBCR
40.0000 meq | EXTENDED_RELEASE_TABLET | Freq: Once | ORAL | Status: AC
  Administered 2018-04-23: 40 meq via ORAL
  Filled 2018-04-23: qty 2

## 2018-04-23 MED ORDER — PREDNISONE 10 MG (21) PO TBPK: ORAL_TABLET | ORAL | 0 refills | Status: DC

## 2018-04-23 NOTE — Progress Notes (Signed)
Discharge instructions explained to pt and pts spouse/ verbalized an understanding/ iv and tele removed/ RX given to pt/ transported off unit via wheelchair.  

## 2018-04-23 NOTE — Discharge Summary (Signed)
[name] [place] NAME: [name]    MR#:  [id number]  DATE OF BIRTH:  [birth year]  DATE OF ADMISSION:  04/22/2018 ADMITTING PHYSICIAN: Saundra Shelling, MD  DATE OF DISCHARGE: 04/23/2018  PRIMARY CARE PHYSICIAN: Adin Hector, MD    ADMISSION DIAGNOSIS:  Vertigo [R42] Near syncope [R55]  DISCHARGE DIAGNOSIS:  Active Problems:   Near syncope   SECONDARY DIAGNOSIS:  History reviewed. No pertinent past medical history.  HOSPITAL COURSE:    67 year old female who presented the emergency room with dizziness.  1.  Vertigo: Patient underwent MRI which was negative for acute stroke.  Her symptoms were consistent with vertigo. She will continue on as needed meclizine for dizziness.  She was eval by ENT. She can follow-up on final echocardiogram results with her PCP. Carotid Doppler was normal without hemodynamically significant stenosis.   2.  Hypokalemia: This was repleted   DISCHARGE CONDITIONS AND DIET:   Stable for discharge on regular diet  CONSULTS OBTAINED:    DRUG ALLERGIES:   Allergies  Allergen Reactions  . Iodinated Diagnostic Agents Hives    DISCHARGE MEDICATIONS:   Allergies as of 04/23/2018      Reactions   Iodinated Diagnostic Agents Hives      Medication List    STOP taking these medications   oxymetazoline 0.05 % nasal spray Commonly known as:  AFRIN NASAL SPRAY     TAKE these medications   albuterol 108 (90 Base) MCG/ACT inhaler Commonly known as:  PROVENTIL HFA;VENTOLIN HFA Inhale 2 puffs into the lungs every 4 (four) hours as needed for wheezing or shortness of breath (cough, shortness of breath or wheezing.).   calcium carbonate 1250 (500 Ca) MG chewable tablet Commonly known as:  OS-CAL Chew 1 tablet by mouth daily.   cetirizine 10 MG chewable tablet Commonly known as:  ZYRTEC Chew 10 mg by mouth daily.   cholecalciferol 1000 units tablet Commonly known as:  VITAMIN D Take  1,000 Units by mouth daily.   fexofenadine 30 MG tablet Commonly known as:  ALLEGRA Take 30 mg by mouth daily.   ipratropium 0.03 % nasal spray Commonly known as:  ATROVENT Place 2 sprays into both nostrils 3 (three) times daily.   meclizine 25 MG tablet Commonly known as:  ANTIVERT Take 1 tablet (25 mg total) by mouth 3 (three) times daily as needed for dizziness or nausea.   mometasone 50 MCG/ACT nasal spray Commonly known as:  NASONEX Place 2 sprays into the nose daily.   MULTI-VITAMINS Tabs Take by mouth.         Today   CHIEF COMPLAINT:  Patient with vertigo symptoms somewhat improving   VITAL SIGNS:  Blood pressure (!) 132/100, pulse (!) 117, temperature 98.2 F (36.8 C), temperature source Oral, resp. rate 16, height 5\' 4"  (1.626 m), weight 61.5 kg, SpO2 100 %.   REVIEW OF SYSTEMS:  Review of Systems  Constitutional: Negative.  Negative for chills, fever and malaise/fatigue.  HENT: Negative.  Negative for ear discharge, ear pain, hearing loss, nosebleeds and sore throat.   Eyes: Negative.  Negative for blurred vision and pain.  Respiratory: Negative.  Negative for cough, hemoptysis, shortness of breath and wheezing.   Cardiovascular: Negative.  Negative for chest pain, palpitations and leg swelling.  Gastrointestinal: Negative.  Negative for abdominal pain, blood in stool, diarrhea, nausea and vomiting.  Genitourinary: Negative.  Negative for dysuria.  Musculoskeletal: Negative.  Negative for back pain.  Skin: Negative.   Neurological: Positive for dizziness. Negative for tremors, speech change, focal weakness, seizures and headaches.  Endo/Heme/Allergies: Negative.  Does not bruise/bleed easily.  Psychiatric/Behavioral: Negative.  Negative for depression, hallucinations and suicidal ideas.     PHYSICAL EXAMINATION:  GENERAL:  67 y.o.-year-old patient lying in the bed with no acute distress.  NECK:  Supple, no jugular venous distention. No thyroid  enlargement, no tenderness.  LUNGS: Normal breath sounds bilaterally, no wheezing, rales,rhonchi  No use of accessory muscles of respiration.  CARDIOVASCULAR: S1, S2 normal. No murmurs, rubs, or gallops.  ABDOMEN: Soft, non-tender, non-distended. Bowel sounds present. No organomegaly or mass.  EXTREMITIES: No pedal edema, cyanosis, or clubbing.  PSYCHIATRIC: The patient is alert and oriented x 3.  SKIN: No obvious rash, lesion, or ulcer.  Neuro: No nystagmus or focal deficit DATA REVIEW:   CBC Recent Labs  Lab 04/22/18 1441  WBC 7.3  HGB 14.5  HCT 42.2  PLT 237    Chemistries  Recent Labs  Lab 04/22/18 1441 04/23/18 0358  NA 137 138  K 3.5 3.3*  CL 103 107  CO2 25 23  GLUCOSE 102* 87  BUN 18 12  CREATININE 0.87 0.64  CALCIUM 10.0 8.9  AST 29  --   ALT 22  --   ALKPHOS 59  --   BILITOT 0.8  --     Cardiac Enzymes Recent Labs  Lab 04/22/18 2233 04/23/18 0358 04/23/18 1101  TROPONINI <0.03 <0.03 <0.03    Microbiology Results  @[email or web address] @  RADIOLOGY:  Ct Head Wo Contrast  Result Date: 04/22/2018 CLINICAL DATA:  Altered level of consciousness. EXAM: CT HEAD WITHOUT CONTRAST TECHNIQUE: Contiguous axial images were obtained from the base of the skull through the vertex without intravenous contrast. COMPARISON:  MRI head 11/30/2008 FINDINGS: Brain: No evidence of acute infarction, hemorrhage, hydrocephalus, extra-axial collection or mass lesion/mass effect. Vascular: Negative for hyperdense vessel Skull: Negative Sinuses/Orbits: Negative Other: None IMPRESSION: Negative CT head Electronically Signed   By: Franchot Gallo M.D.   On: 04/22/2018 17:20   Mr Brain Wo Contrast  Result Date: 04/22/2018 CLINICAL DATA:  Lightheadedness, vomiting.  Evaluate vertigo. EXAM: MRI HEAD WITHOUT CONTRAST TECHNIQUE: Axial and coronal diffusion weighted imaging obtained. Per technologist note, scanner malfunction resulted in premature termination of examination. COMPARISON:  CT HEAD  April 22, 2018 no MRI head November 30, 2008 FINDINGS: Brain: No reduced diffusion to suggest acute ischemia, hyperacute demyelination, status epilepticus or hypercellular tumor. No midline shift, mass effect. No hydrocephalus. Vascular: Nondiagnostic assessment. Skull and upper cervical spine: No reduced diffusion to suggest hypercellular tumor. Sinuses/Orbits: Nondiagnostic assessment. Other: Not applicable. IMPRESSION: 1. Limited 2 sequence MRI head: No acute intracranial process. Electronically Signed   By: Elon Alas M.D.   On: 04/22/2018 20:31   US Carotid Bilateral  Result Date: 04/23/2018 CLINICAL DATA:  67 year old female with near syncope EXAM: BILATERAL CAROTID DUPLEX ULTRASOUND TECHNIQUE: Pearline Cables scale imaging, color Doppler and duplex ultrasound were performed of bilateral carotid and vertebral arteries in the neck. COMPARISON:  Prior brain MRI 04/22/2018 FINDINGS: Criteria: Quantification of carotid stenosis is based on velocity parameters that correlate the residual internal carotid diameter with NASCET-based stenosis levels, using the diameter of the distal internal carotid lumen as the denominator for stenosis measurement. The following velocity measurements were obtained: RIGHT ICA: 91/39 cm/sec CCA: 87/56 cm/sec SYSTOLIC ICA/CCA RATIO:  1.1 ECA:  94 cm/sec LEFT ICA: 84/38 cm/sec CCA: 43/32 cm/sec SYSTOLIC ICA/CCA RATIO:  1.1 ECA:  77 cm/sec  RIGHT CAROTID ARTERY: No significant atherosclerotic plaque or evidence of stenosis in the internal carotid artery. RIGHT VERTEBRAL ARTERY:  Patent with normal antegrade flow. LEFT CAROTID ARTERY: No significant atherosclerotic plaque or evidence of stenosis in the internal carotid artery. LEFT VERTEBRAL ARTERY:  Patent with normal antegrade flow. IMPRESSION: Normal examination. No evidence of significant atherosclerotic plaque or stenosis. Electronically Signed   By: Jacqulynn Cadet M.D.   On: 04/23/2018 09:47      Allergies as of 04/23/2018       Reactions   Iodinated Diagnostic Agents Hives      Medication List    STOP taking these medications   oxymetazoline 0.05 % nasal spray Commonly known as:  AFRIN NASAL SPRAY     TAKE these medications   albuterol 108 (90 Base) MCG/ACT inhaler Commonly known as:  PROVENTIL HFA;VENTOLIN HFA Inhale 2 puffs into the lungs every 4 (four) hours as needed for wheezing or shortness of breath (cough, shortness of breath or wheezing.).   calcium carbonate 1250 (500 Ca) MG chewable tablet Commonly known as:  OS-CAL Chew 1 tablet by mouth daily.   cetirizine 10 MG chewable tablet Commonly known as:  ZYRTEC Chew 10 mg by mouth daily.   cholecalciferol 1000 units tablet Commonly known as:  VITAMIN D Take 1,000 Units by mouth daily.   fexofenadine 30 MG tablet Commonly known as:  ALLEGRA Take 30 mg by mouth daily.   ipratropium 0.03 % nasal spray Commonly known as:  ATROVENT Place 2 sprays into both nostrils 3 (three) times daily.   meclizine 25 MG tablet Commonly known as:  ANTIVERT Take 1 tablet (25 mg total) by mouth 3 (three) times daily as needed for dizziness or nausea.   mometasone 50 MCG/ACT nasal spray Commonly known as:  NASONEX Place 2 sprays into the nose daily.   MULTI-VITAMINS Tabs Take by mouth.          Management plans discussed with the patient and she is in agreement. Stable for discharge home  Patient should follow up with [facility]  CODE STATUS:     Code Status Orders  (From admission, onward)         Start     Ordered   04/22/18 2218  Full code  Continuous     04/22/18 2217        Code Status History    This patient has a current code status but no historical code status.      TOTAL TIME TAKING CARE OF THIS PATIENT: 38 minutes.    Note: This dictation was prepared with Dragon dictation along with smaller phrase technology. Any transcriptional errors that result from this process are unintentional.  Jezelle Gullick M.D on 04/23/2018 at  12:34 PM  Between 7am to 6pm - Pager - [phone] After 6pm go to [email or web address] - password [email or web address] [facility]  Office  [phone]  CC: Primary care physician; Adin Hector, MD

## 2018-04-23 NOTE — Progress Notes (Signed)
*  PRELIMINARY RESULTS* Echocardiogram 2D Echocardiogram has been performed.  [name] 04/23/2018, 10:10 AM

## 2018-04-23 NOTE — Consult Note (Signed)
..   Meredith, Prince [id number] 1950/06/14 Bettey Costa, MD  Reason for Consult: Vertigo  HPI: 67 y.o. female presented to ED yesterday with acute onset of vertigo.  Reports was at work and developed flushing, nausea and vomitting suddenly.  When patient sits up, she continues to be dizzy.  Distant history of similar symptoms 25 years ago.  Recent sinus infection per patient and reports that her left ear feels stopped up.  Evaluated in ER with CT and MRI.  Also underwent cardiac workup which was normal.  Allergies:  Allergies  Allergen Reactions  . Iodinated Diagnostic Agents Hives    ROS: Review of systems normal other than 12 systems except per HPI.  PMH: History reviewed. No pertinent past medical history.  FH:  Family History  Problem Relation Age of Onset  . Lung cancer Father   . Breast cancer Neg Hx     SH:  Social History   Socioeconomic History  . Marital status: Married    Spouse name: Not on file  . Number of children: Not on file  . Years of education: Not on file  . Highest education level: Not on file  Occupational History  . Not on file  Social Needs  . Financial resource strain: Not on file  . Food insecurity:    Worry: Not on file    Inability: Not on file  . Transportation needs:    Medical: Not on file    Non-medical: Not on file  Tobacco Use  . Smoking status: Never Smoker  . Smokeless tobacco: Never Used  Substance and Sexual Activity  . Alcohol use: No    Alcohol/week: 0.0 standard drinks  . Drug use: No  . Sexual activity: Not on file  Lifestyle  . Physical activity:    Days per week: Not on file    Minutes per session: Not on file  . Stress: Not on file  Relationships  . Social connections:    Talks on phone: Not on file    Gets together: Not on file    Attends religious service: Not on file    Active member of club or organization: Not on file    Attends meetings of clubs or organizations: Not on file    Relationship status: Not on  file  . Intimate partner violence:    Fear of current or ex partner: Not on file    Emotionally abused: Not on file    Physically abused: Not on file    Forced sexual activity: Not on file  Other Topics Concern  . Not on file  Social History Narrative  . Not on file    PSH:  Past Surgical History:  Procedure Laterality Date  . KNEE SURGERY    . TOTAL VAGINAL HYSTERECTOMY      Physical  Exam:  GEN-  CN 2-12 grossly intact and symmetric. EARS-  EAC/TMs normal BL.  OC/OP-  Oral cavity, lips, gums, ororpharynx normal with no masses or lesions. NECK-  Supple, no LAD, thyroid normal CARD-  RRR RESP-  No increased work of breathing   A/P: Vertigo with normal cardiological workup.  Possible Meniere's given previous history and feeling of left ear being stopped up versus BPPV  Plan:  Reviewed CT and MRI scan which were normal.  Prednisone 10mg  taper and follow up tomorrow as outpatient for vestibular evaluation/Hall-pike/audiogram.     Halina Asano 04/23/2018 5:43 PM

## 2018-04-24 DIAGNOSIS — R42 Dizziness and giddiness: Secondary | ICD-10-CM | POA: Diagnosis not present

## 2018-04-24 LAB — HIV ANTIBODY (ROUTINE TESTING W REFLEX): HIV Screen 4th Generation wRfx: NONREACTIVE

## 2018-05-01 ENCOUNTER — Ambulatory Visit: Payer: 59 | Attending: Otolaryngology

## 2018-05-01 DIAGNOSIS — R42 Dizziness and giddiness: Secondary | ICD-10-CM | POA: Diagnosis not present

## 2018-05-01 DIAGNOSIS — R2681 Unsteadiness on feet: Secondary | ICD-10-CM | POA: Diagnosis not present

## 2018-05-01 NOTE — Therapy (Signed)
[place] MAIN [facility] SERVICES [address] [place], [place], [address] Phone: [phone]   Fax:  [phone]  Physical Therapy Evaluation  Patient Details  Name: [name] MRN: [id number] Date of Birth: Mar 05, 1951 Referring Provider (PT): Carloyn Manner   Encounter Date: 05/01/2018  PT End of Session - 05/01/18 1117    Visit Number  1    Number of Visits  7    Date for PT Re-Evaluation  06/12/18    Authorization Type  Eval 05/01/18    PT Start Time  0905    PT Stop Time  1015    PT Time Calculation (min)  70 min    Equipment Utilized During Treatment  Gait belt    Activity Tolerance  Patient tolerated treatment well    Behavior During Therapy  [facility] for tasks assessed/performed       History reviewed. No pertinent past medical history.  Past Surgical History:  Procedure Laterality Date  . KNEE SURGERY    . TOTAL VAGINAL HYSTERECTOMY      There were no vitals filed for this visit.    Subjective Assessment - 05/01/18 1115    Subjective  Vertigo/unsteadiness    Pertinent History  [name]  is a 67 y.o. female who presented to the emergency room for severe vertigo and nausea on 04/22/18 stating she felt like she was about to pass out. Symptoms started while she was standing at her desk at work. She reports that she sat down and then suddenly started to experience vertigo, vomiting, and sweating. Upon questioning today she states that she didn't feel like she was actually going to pass out but just couldn't "control my body."  She was evaluated in the emergency room with a head CT and brain MRI which showed no acute abnormalities. Per ED note pt was having persistent nystagmus which did not fatigue. Symptoms did not improve and pt reports that she couldn't open her eyes without experiencing severe vertigo. Hospitalist service was consulted due to persistent symptoms and pt was admitted. She reports that by the tiime she reached  her room she was starting to improve but still couldn't stand or walk due to imbalance. She saw ENT in the hospital and was prescribed a steroid and scheduled to follow-up as an outpatient. She saw ENT as an outpatient on 04/24/18 and pt describes them performing a Dix-Hallpike test but did not do any further testing. She was diagnosed with "vertigo" and was scheduled for a VNG study on 05/15/18. Pt states that have not performed an audiogram. Pt has been under a lot of stress recently with some work related issues and reports some anxiety around the time that her symptoms started. She  denies history of migraine headaches.  She recently finished an Abx prescription for a sinus infection and had also had a dental crown 2 days before her symptoms started. Pt reports approximately 80% improvement in symptoms since they first started.     How long can you sit comfortably?  no limitations    How long can you stand comfortably?  no limitations    How long can you walk comfortably?  no limitations    Diagnostic tests  MRI and head CT without acute abnormalities    Patient Stated Goals  Return to full function without unsteadiness    Currently in Pain?  No/denies         VESTIBULAR AND BALANCE EVALUATION   HISTORY:  Subjective history of  current problem: [name]  is a 67 y.o. female who presented to the emergency room for severe vertigo and nausea on 04/22/18 stating she felt like she was about to pass out. Symptoms started while she was standing at her desk at work. She reports that she sat down and then suddenly started to experience vertigo, vomiting, and sweating. Upon questioning today she states that she didn't feel like she was actually going to pass out but just couldn't "control my body."  She was evaluated in the emergency room with a head CT and brain MRI which showed no acute abnormalities. Per ED note pt was having persistent nystagmus which did not fatigue. Symptoms did not improve and pt  reports that she couldn't open her eyes without experiencing severe vertigo. Hospitalist service was consulted due to persistent symptoms and pt was admitted. She reports that by the tiime she reached her room she was starting to improve but still couldn't stand or walk due to imbalance. She saw ENT in the hospital and was prescribed a steroid and scheduled to follow-up as an outpatient. She saw ENT as an outpatient on 04/24/18 and pt describes them performing a Dix-Hallpike test but did not do any further testing. She was diagnosed with "vertigo" and was scheduled for a VNG study on 05/15/18. Pt states that have not performed an audiogram. Pt has been under a lot of stress recently with some work related issues and reports some anxiety around the time that her symptoms started. She  denies history of migraine headaches.  She recently finished an Abx prescription for a sinus infection and had also had a dental crown 2 days before her symptoms started. Pt reports approximately 80% improvement in symptoms since they first started.  Description of dizziness: (vertigo, unsteadiness, lightheadedness, falling, general unsteadiness, whoozy, swimmy-headed sensation, aural fullness): vertigo initially but now complains of unsteadiness and veering when walking. Frequency: one acute episode of vertigo and now persistent unsteadiness Duration: Less than 12 hours for her acute vertigo but no persistent unsteadiness Symptom nature: (motion provoked, positional, spontaneous, constant, variable, intermittent) light made it worse but not currently, spontaneous onset. Now complains of unsteadiness with wlaking  Provocative Factors: No known aggravating factors. Pt reports she is struggling to walk a straight line. Easing Factors: No know improving factors. Tried meclizine but she didn't like it because it made her groggy.   Progression of symptoms: (better, worse, no change since onset): Pt reports approximately 80%  improvement since symptoms started History of similar episodes: Similar episode 25 years ago. She was at home ironing and started feeling. Symptoms lasted 24 hours, she was seen in the ED and then it resolved.   Falls (yes/no): No Number of falls in past 6 months: No  Prior Functional Level: Independent and active. Pt likes to work and otherwise is unable to report any recreational activities.  Auditory complaints (tinnitus, pain, drainage): None Vision (last eye exam, diplopia, recent changes): "It feels like it is not as crisp as it should be"   Red Flags: (dysarthria, dysphagia, drop attacks, bowel and bladder changes, recent weight loss/gain) Review of systems negative for red flags.     EXAMINATION  POSTURE: No gross abnormalities  NEUROLOGICAL SCREEN: (2+ unless otherwise noted.) N=normal  Ab=abnormal  Level Dermatome R L Myotome R L Reflex R L  C3 Anterior Neck N N Sidebend C2-3 N N Jaw CN V    C4 Top of Shoulder N N Shoulder Shrug C4 N N Hoffman's UMN  C5 Lateral Upper Arm N N Shoulder ABD C4-5 N N Biceps C5-6    C6 Lateral Arm/ Thumb N N Arm Flex/ Wrist Ext C5-6 N N Brachiorad. C5-6    C7 Middle Finger N N Arm Ext//Wrist Flex C6-7 N N Triceps C7    C8 4th & 5th Finger N N Flex/ Ext Carpi Ulnaris C8 N N Patellar (L3-4)    T1 Medial Arm N N Interossei T1 N N Gastrocnemius    L2 Medial thigh/groin N N Illiopsoas (L2-3) N N     L3 Lower thigh/med.knee N N Quadriceps (L3-4) N N     L4 Medial leg/lat thigh N N Tibialis Ant (L4-5) N N     L5 Lat. leg & dorsal foot N N EHL (L5) N N     S1 post/lat foot/thigh/leg N N Gastrocnemius (S1-2) N N     S2 Post./med. thigh & leg N N Hamstrings (L4-S3) N N       SOMATOSENSORY:         Sensation           Intact      Diminished         Absent  Light touch Normal       COORDINATION: Finger to Nose: Normal Heel to Shin: Normal Pronator Drift: Negative  MUSCULOSKELETAL SCREEN: Cervical Spine ROM: WFL and painless in all planes.  No gross deficits identified   ROM: WFL  MMT: WFL without focal weakness  Functional Mobility: WFL  Gait: Scanning of visual environment with gait is: Limited spontaneous head turning during ambulation noted   POSTURAL CONTROL TESTS:   Clinical Test of Sensory Interaction for Balance    (CTSIB):  CONDITION TIME STRATEGY SWAY  Eyes open, firm surface 30 seconds ankle 1+  Eyes closed, firm surface 30 seconds ankle 2+  Eyes open, foam surface 30 seconds ankle 2+  Eyes closed, foam surface 22.7 seconds ankle 4+,    OCULOMOTOR / VESTIBULAR TESTING:  Oculomotor Exam- Room Light  Findings Comments  Ocular Alignment normal   Ocular ROM normal   Spontaneous Nystagmus normal   End-Gaze Nystagmus normal   Smooth Pursuit abnormal Mildly saccadic  Saccades normal   VOR normal   VOR Cancellation normal   Left Head Thrust abnormal Corrective saccade required consistently  Right Head Thrust normal   Head Shaking Nystagmus not examined   Static Acuity not examined   Dynamic Acuity not examined     Oculomotor Exam- Fixation Suppressed  Findings Comments  Ocular Alignment normal   Ocular ROM normal   Spontaneous Nystagmus abnormal Pure horizontal R beating nystagmus that does not fatigue. 2nd degree (worsens with gaze to the R, present at mid gaze, absent with L gaze)  End-Gaze Nystagmus normal   Head Shaking Nystagmus abnormal Increased intensity of pure R beating horizontal nystagmus initially after headshake    BPPV TESTS:  Symptoms Duration Intensity Nystagmus  L Dix-Hallpike None   R beating horizontal nystagmus, non-fatiguing  R Dix-Hallpike None   None  L Head Roll None   R beating horizontal nystagmus, non-fatiguing  R Head Roll None   R beating horizontal nystagmus, non-fatiguing  L Sidelying Test      R Sidelying Test        FUNCTIONAL OUTCOME MEASURES:  Results Comments  DHI 18/100 Mild perception of handicap; in need of intervention  ABC Scale 100% High  confidence  DGI 19/24 Falls risk; in need of intervention  [facility] PT Assessment - 05/01/18 1102      Assessment   Medical Diagnosis  Vertigo    Referring Provider (PT)  Creighton Vaught    Onset Date/Surgical Date  04/22/18    Hand Dominance  Left    Next MD Visit  05/15/18    Prior Therapy  Yes for foot pain      Precautions   Precautions  None      Restrictions   Weight Bearing Restrictions  No    Other Position/Activity Restrictions  no      Balance Screen   Has the patient fallen in the past 6 months  No    Has the patient had a decrease in activity level because of a fear of falling?   No    Is the patient reluctant to leave their home because of a fear of falling?   No      Home Film/video editor residence    Living Arrangements  Spouse/significant other    Available Help at Discharge  Family    Type of [place] to enter    Entrance Stairs-Number of Steps  5    Entrance Stairs-Rails  Right    Home Layout  Two level    Alternate Level Stairs-Number of Steps  12    Alternate Level Stairs-Rails  Right    Home Equipment  None      Prior Function   Level of Independence  Independent    Vocation  Full time employment    Vocation Requirements  standing    Leisure  walking,       Cognition   Overall Cognitive Status  Within Functional Limits for tasks assessed    Attention  Focused      Observation/Other Assessments   Other Surveys   Other Surveys    Activities of Balance Confidence Scale (ABC Scale)   100%    Dizziness Handicap Inventory (DHI)   18/100      Standardized Balance Assessment   Standardized Balance Assessment  Dynamic Gait Index      Dynamic Gait Index   Level Surface  Normal    Change in Gait Speed  Normal    Gait with Horizontal Head Turns  Moderate Impairment    Gait with Vertical Head Turns  Moderate Impairment    Gait and Pivot Turn  Normal    Step Over Obstacle  Normal     Step Around Obstacles  Normal    Steps  Mild Impairment    Total Score  19                Objective measurements completed on examination: See above findings.      TREATMENT  Neuromuscular Re-education  VOR x 1 horizontal 60s, performed twice in sitting without any dizziness. VOR x 1 horizontal in standing WBOS once with 2/10 dizziness and notable unsteadiness.  Pt provided written HEP with extensive education about how to perform correctly.           PT Short Term Goals - 05/01/18 1234      PT SHORT TERM GOAL #1   Title  Pt will be independent with HEP in order to improve strength and balance in order to decrease fall risk and improve function at home and work.     Time  3    Period  Weeks    Status  New  Target Date  05/22/18        PT Long Term Goals - 05/01/18 1234      PT LONG TERM GOAL #1   Title  Pt will improve DGI by at least 3 points in order to demonstrate clinically significant improvement in balance and decreased risk for falls     Baseline  05/01/18: 19/24    Time  6    Period  Weeks    Status  New    Target Date  06/12/18      PT LONG TERM GOAL #2   Title  Pt will decrease DHI score by at least 18 points in order to demonstrate clinically significant reduction in disability     Baseline  05/01/18: 18/100    Time  6    Period  Weeks    Status  New    Target Date  06/12/18      PT LONG TERM GOAL #3   Title  Pt will be able to return to a full 8 hour day of work without any increase in her unsteadiness/dizziness    Baseline  05/01/18: currently unable to work due to symptoms    Time  6    Period  Weeks    Status  New    Target Date  06/12/18             Plan - 05/01/18 1119    Clinical Impression Statement  Pt is a pleasant 67 year-old female who presented to the emergency room  On 04/22/18 for severe vertigo and nausea She was evaluated in the emergency room with a head CT and brain MRI which showed no acute  abnormalities. Per ED note pt was having persistent non-fatigable nystagmus. She saw ENT in the hospital and was prescribed a steroid and scheduled to follow-up as an outpatient. She saw ENT as an outpatient on 04/24/18 and pt describes a Dix-Hallpike test but no further testing. She was diagnosed with vertigo, scheduled for a VNG study on 05/15/18, and referred to PT. She recently finished an Abx prescription for a sinus infection and had also had a dental crown 2 days before her symptoms started. Pt reports approximately 80% improvement in symptoms since they first started. On examination today pt has a persistent, non-fatigable pure right horizontal 2nd degree nystagmus following Alexander's law, observable best with fixation suppression. It is generally suppressed in room light and difficult to observe. Positive head impulse test to the left. Additional oculomotor bedside testing appears grossly normal except for mildly saccadic horizontal smooth pursuits. DGI is 19/24 with significant gait deviation during horizontal and vertical head turns. [place] is 18/100. 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 History is consistent with possible acute vestibular neuritis. Pt denies any auditory changes during or following episode but labyrinthitis and Meniere's are also possible causes pending the results of audiogram. Pt started on adaptation exercises on this date. She will benefit from skilled PT services to address deficits in balance and gait as well as decrease symptoms of dizziness in order to return to full function at home and work.     History and Personal Factors relevant to plan of care:  2 personal factors/comorbidities, 3 body systems/activity limitations/participation restrictions      Clinical Presentation  Unstable    Clinical Presentation due to:  Variable symptoms    Clinical Decision Making  Moderate    Rehab Potential  Fair    PT Frequency  1x / week  PT Duration  6  weeks    PT Treatment/Interventions  Other (comment);Therapeutic exercise;Therapeutic activities;Manual techniques;Cryotherapy;Electrical Stimulation;Moist Heat;Iontophoresis 4mg /ml Dexamethasone;Ultrasound;ADLs/Self Care Home Management;Aquatic Therapy;Canalith Repostioning;Traction;Functional mobility training;Gait training;Balance training;Neuromuscular re-education;Patient/family education;Dry needling;Vestibular   iontophoresis with dexamethasone   PT Next Visit Plan  Review VOR x 1 horizontal and progress as appropriate, continue with additional habituation and balance exercises    PT Home Exercise Plan  VOR x 1 horizontal in sitting (OK to progress to standing WBOS then NBOS as appropriate)    Consulted and Agree with Plan of Care  Patient       Patient will benefit from skilled therapeutic intervention in order to improve the following deficits and impairments:  Decreased balance, Dizziness, Difficulty walking  Visit Diagnosis: Dizziness and giddiness - Plan: PT plan of care cert/re-cert  Unsteadiness on feet - Plan: PT plan of care cert/re-cert     Problem List Patient Active Problem List   Diagnosis Date Noted  . Near syncope 04/22/2018  . Costochondritis 12/26/2015  . Allergic state 02/09/2015  . Adult hypothyroidism 02/09/2015  . OP (osteoporosis) 02/09/2015  . AV (anaerobic vaginosis) 11/18/2014  . Non-traumatic rotator cuff tear 09/29/2013   Phillips Grout PT, DPT, GCS  Huprich,Jason 05/01/2018, 12:50 PM  [place] MAIN [facility] SERVICES [address] [place], [place], [address] Phone: [phone]   Fax:  [phone]  Name: JERNIE SCHUTT MRN: [id number] Date of Birth: Jun 27, 1950

## 2018-05-05 ENCOUNTER — Ambulatory Visit: Payer: 59

## 2018-05-05 DIAGNOSIS — R2681 Unsteadiness on feet: Secondary | ICD-10-CM | POA: Diagnosis not present

## 2018-05-05 DIAGNOSIS — R42 Dizziness and giddiness: Secondary | ICD-10-CM

## 2018-05-05 NOTE — Patient Instructions (Signed)
Access Code: [email or web address]  URL: [email or web address]  Date: 05/05/2018  Prepared by: Roxana Hires   Exercises  Standing Gaze Stabilization with Head Rotation - 3 reps - 60 seconds hold - 4x daily - 7x weekly  Tandem Stance with Head Rotation - 3 reps - 30 seconds hold - 4x daily - 7x weekly

## 2018-05-05 NOTE — Therapy (Signed)
[place] MAIN [facility] SERVICES [address] [place], [place], [address] Phone: [phone]   Fax:  [phone]  Physical Therapy Treatment  Patient Details  Name: [name] MRN: [id number] Date of Birth: 05-18-1950 Referring Provider (PT): Carloyn Manner   Encounter Date: 05/05/2018  PT End of Session - 05/06/18 1439    Visit Number  2    Number of Visits  7    Date for PT Re-Evaluation  06/12/18    Authorization Type  Eval 05/01/18    PT Start Time  1605    PT Stop Time  1650    PT Time Calculation (min)  45 min    Equipment Utilized During Treatment  Gait belt    Activity Tolerance  Patient tolerated treatment well    Behavior During Therapy  [facility] for tasks assessed/performed       History reviewed. No pertinent past medical history.  Past Surgical History:  Procedure Laterality Date  . KNEE SURGERY    . TOTAL VAGINAL HYSTERECTOMY      There were no vitals filed for this visit.  Subjective Assessment - 05/05/18 1606    Subjective  Pt reports that she continues to feel improvement in her symptoms. She feels approximately 90% improved at this time. Performing HEP without issue and notices decreased dizziness over the course of the last week.     Pertinent History  [name]  is a 67 y.o. female who presented to the emergency room for severe vertigo and nausea on 04/22/18 stating she felt like she was about to pass out. Symptoms started while she was standing at her desk at work. She reports that she sat down and then suddenly started to experience vertigo, vomiting, and sweating. Upon questioning today she states that she didn't feel like she was actually going to pass out but just couldn't "control my body."  She was evaluated in the emergency room with a head CT and brain MRI which showed no acute abnormalities. Per ED note pt was having persistent nystagmus which did not fatigue. Symptoms did not improve and pt reports that  she couldn't open her eyes without experiencing severe vertigo. Hospitalist service was consulted due to persistent symptoms and pt was admitted. She reports that by the tiime she reached her room she was starting to improve but still couldn't stand or walk due to imbalance. She saw ENT in the hospital and was prescribed a steroid and scheduled to follow-up as an outpatient. She saw ENT as an outpatient on 04/24/18 and pt describes them performing a Dix-Hallpike test but did not do any further testing. She was diagnosed with "vertigo" and was scheduled for a VNG study on 05/15/18. Pt states that have not performed an audiogram. Pt has been under a lot of stress recently with some work related issues and reports some anxiety around the time that her symptoms started. She  denies history of migraine headaches.  She recently finished an Abx prescription for a sinus infection and had also had a dental crown 2 days before her symptoms started. Pt reports approximately 80% improvement in symptoms since they first started.     How long can you sit comfortably?  no limitations    How long can you stand comfortably?  no limitations    How long can you walk comfortably?  no limitations    Diagnostic tests  MRI and head CT without acute abnormalities    Patient Stated Goals  Return  to full function without unsteadiness    Currently in Pain?  No/denies           TREATMENT  Neuromuscular Re-education  Re-examined nystagmus with IR goggles today and it is less vigorous but still present at midline and with R gaze;  VOR VOR x 1 horizontal for 60s in sitting, standing WBOS, standing NBOS, standing NBOS with conflicting background, and standing NBOS conflicting background on foam. Only able to induce unsteadiness today during final condition. No dizziness reported during any condition;   Semitandem Semitandem balance with horizontal head turns x 30s each, notable instability (added to HEP). Attempted full  tandem but it is too challenging for patient;   Hallway Ambulation Vertical ball toss during gait with head/eye follow 75' x 2; Horizontal ball toss with therapist during gait with head/eye follow 75' x 2; Ambulation with ball pass over shoulder and return catch over opposite shoulder 75' x 2; Ambulation in hallway with head turns to read playing cards on walls 75' x 2;  DVA tested and it is 20/20 static and degrades to 20/40 with head turns (3 line loss). Positive test for possible UVH.   Airex Airex balance NBOS with eyes closed 30s x 2;    Pt educated throughout session about proper posture and technique with exercises. Improved exercise technique, movement at target joints after min to mod verbal, visual, tactile cues.    Pt is making good progress with therapy. The speed of her nystagmus has decreased as observed with IR goggles. Unable to induce any dizziness today during session only unsteadiness. She struggles significantly with head turns in narrow stance and staggers severely with head turns during gait. Pt provided progression of HEP and encouraged to follow-up as scheduled.                  PT Short Term Goals - 05/01/18 1234      PT SHORT TERM GOAL #1   Title  Pt will be independent with HEP in order to improve strength and balance in order to decrease fall risk and improve function at home and work.     Time  3    Period  Weeks    Status  New    Target Date  05/22/18        PT Long Term Goals - 05/01/18 1234      PT LONG TERM GOAL #1   Title  Pt will improve DGI by at least 3 points in order to demonstrate clinically significant improvement in balance and decreased risk for falls     Baseline  05/01/18: 19/24    Time  6    Period  Weeks    Status  New    Target Date  06/12/18      PT LONG TERM GOAL #2   Title  Pt will decrease DHI score by at least 18 points in order to demonstrate clinically significant reduction in disability     Baseline   05/01/18: 18/100    Time  6    Period  Weeks    Status  New    Target Date  06/12/18      PT LONG TERM GOAL #3   Title  Pt will be able to return to a full 8 hour day of work without any increase in her unsteadiness/dizziness    Baseline  05/01/18: currently unable to work due to symptoms    Time  6    Period  Weeks  Status  New    Target Date  06/12/18            Plan - 05/06/18 1441    Clinical Impression Statement  Pt is making good progress with therapy. The speed of her nystagmus has decreased as observed with IR goggles. Unable to induce any dizziness today during session only unsteadiness. She struggles significantly with head turns in narrow stance and staggers severely with head turns during gait. Pt provided progression of HEP and encouraged to follow-up as scheduled.     Rehab Potential  Fair    PT Frequency  1x / week    PT Duration  6 weeks    PT Treatment/Interventions  Other (comment);Therapeutic exercise;Therapeutic activities;Manual techniques;Cryotherapy;Electrical Stimulation;Moist Heat;Iontophoresis 4mg /ml Dexamethasone;Ultrasound;ADLs/Self Care Home Management;Aquatic Therapy;Canalith Repostioning;Traction;Functional mobility training;Gait training;Balance training;Neuromuscular re-education;Patient/family education;Dry needling;Vestibular   iontophoresis with dexamethasone   PT Next Visit Plan  Review HEP ontinue with additional habituation and balance exercises    PT Home Exercise Plan  VOR x 1 horizontal in standing on foam NBOS conflicting background, semitandem balance with horizontal head turns;    Consulted and Agree with Plan of Care  Patient       Patient will benefit from skilled therapeutic intervention in order to improve the following deficits and impairments:  Decreased balance, Dizziness, Difficulty walking  Visit Diagnosis: Dizziness and giddiness  Unsteadiness on feet     Problem List Patient Active Problem List   Diagnosis Date  Noted  . Near syncope 04/22/2018  . Costochondritis 12/26/2015  . Allergic state 02/09/2015  . Adult hypothyroidism 02/09/2015  . OP (osteoporosis) 02/09/2015  . AV (anaerobic vaginosis) 11/18/2014  . Non-traumatic rotator cuff tear 09/29/2013   Phillips Grout PT, DPT, GCS  [name] 05/06/2018, 2:49 PM  [place] MAIN [facility] SERVICES [address] [place], [place], [address] Phone: [phone]   Fax:  [phone]  Name: [name] MRN: [id number] Date of Birth: 03-23-1951

## 2018-05-06 DIAGNOSIS — R42 Dizziness and giddiness: Secondary | ICD-10-CM

## 2018-05-06 HISTORY — DX: Dizziness and giddiness: R42

## 2018-05-15 DIAGNOSIS — R42 Dizziness and giddiness: Secondary | ICD-10-CM | POA: Diagnosis not present

## 2018-05-22 ENCOUNTER — Ambulatory Visit: Payer: Commercial Managed Care - PPO | Attending: Otolaryngology

## 2018-05-22 DIAGNOSIS — R42 Dizziness and giddiness: Secondary | ICD-10-CM | POA: Diagnosis not present

## 2018-05-22 DIAGNOSIS — R2681 Unsteadiness on feet: Secondary | ICD-10-CM | POA: Insufficient documentation

## 2018-05-22 NOTE — Therapy (Signed)
[place] MAIN [facility] SERVICES [address] [place], [place], [address] Phone: [phone]   Fax:  [phone]  Physical Therapy Treatment  Patient Details  Name: [name] MRN: [id number] Date of Birth: 12/03/1950 Referring Provider (PT): Carloyn Manner   Encounter Date: 05/22/2018  PT End of Session - 05/22/18 2033    Visit Number  3    Number of Visits  7    Date for PT Re-Evaluation  06/12/18    Authorization Type  Eval 05/01/18    PT Start Time  0805    PT Stop Time  0845    PT Time Calculation (min)  40 min    Equipment Utilized During Treatment  Gait belt    Activity Tolerance  Patient tolerated treatment well    Behavior During Therapy  [facility] for tasks assessed/performed       History reviewed. No pertinent past medical history.  Past Surgical History:  Procedure Laterality Date  . KNEE SURGERY    . TOTAL VAGINAL HYSTERECTOMY      There were no vitals filed for this visit.  Subjective Assessment - 05/22/18 0810    Subjective  Pt reports that she continues to feels like her symptoms have worsened since her VNG study last week. She is afraid she has regressed. No specific questions currently and no reported pain. She has a follow-up appointment next week to discuss the results of her VNG. She doesn't know any information other than the fact that the doctor told her she has something "seriously wrong" with her L ear.    Pertinent History  [name]  is a 68 y.o. female who presented to the emergency room for severe vertigo and nausea on 04/22/18 stating she felt like she was about to pass out. Symptoms started while she was standing at her desk at work. She reports that she sat down and then suddenly started to experience vertigo, vomiting, and sweating. Upon questioning today she states that she didn't feel like she was actually going to pass out but just couldn't "control my body."  She was evaluated in the emergency room  with a head CT and brain MRI which showed no acute abnormalities. Per ED note pt was having persistent nystagmus which did not fatigue. Symptoms did not improve and pt reports that she couldn't open her eyes without experiencing severe vertigo. Hospitalist service was consulted due to persistent symptoms and pt was admitted. She reports that by the tiime she reached her room she was starting to improve but still couldn't stand or walk due to imbalance. She saw ENT in the hospital and was prescribed a steroid and scheduled to follow-up as an outpatient. She saw ENT as an outpatient on 04/24/18 and pt describes them performing a Dix-Hallpike test but did not do any further testing. She was diagnosed with "vertigo" and was scheduled for a VNG study on 05/15/18. Pt states that have not performed an audiogram. Pt has been under a lot of stress recently with some work related issues and reports some anxiety around the time that her symptoms started. She  denies history of migraine headaches.  She recently finished an Abx prescription for a sinus infection and had also had a dental crown 2 days before her symptoms started. Pt reports approximately 80% improvement in symptoms since they first started.     How long can you sit comfortably?  no limitations    How long can you stand comfortably?  no  limitations    How long can you walk comfortably?  no limitations    Diagnostic tests  MRI and head CT without acute abnormalities    Patient Stated Goals  Return to full function without unsteadiness    Currently in Pain?  No/denies         TREATMENT   Neuromuscular Re-education  Re-examined nystagmus with IR goggles today and it is unchanged since last visit. She still has visible nystagmus at midline and with R gaze;   VOR VOR x 1 horizontal for 60s x 2 in standing NBOS with conflicting background, and standing NBOS conflicting background on foam x 60s. Less unsteadiness noted today. No dizziness reported  during any condition;   Semitandem Semitandem balance with horizontal head turns x 30s each, less instability than during previous sessions but still too unstable to attempt full tandem stance   Hallway Ambulation Vertical ball toss during gait with head/eye follow 75' x 2; Horizontal ball toss with therapist during gait with head/eye follow 75' x 2; Ambulation with ball pass over shoulder and return catch over opposite shoulder 75' x 2;   Airex Airex balance NBOS with eyes closed 30s x 2;   Pt completed DHI: 12/100 (unbilled)   Pt educated throughout session about proper posture and technique with exercises. Improved exercise technique, movement at target joints after min to mod verbal, visual, tactile cues.    Pt is making good progress with therapy although subjectively she is frustrated and anxious following her VNG study with ENT. She demonstrates improved stability today and she denies dizziness during gaze stabilization exercises today. Pt provided progression of HEP and encouraged to follow-up as scheduled. Pt will benefit from PT services to address deficits in strength, balance, and mobility in order to return to full function at home.                         PT Short Term Goals - 05/01/18 1234      PT SHORT TERM GOAL #1   Title  Pt will be independent with HEP in order to improve strength and balance in order to decrease fall risk and improve function at home and work.     Time  3    Period  Weeks    Status  New    Target Date  05/22/18        PT Long Term Goals - 05/01/18 1234      PT LONG TERM GOAL #1   Title  Pt will improve DGI by at least 3 points in order to demonstrate clinically significant improvement in balance and decreased risk for falls     Baseline  05/01/18: 19/24    Time  6    Period  Weeks    Status  New    Target Date  06/12/18      PT LONG TERM GOAL #2   Title  Pt will decrease DHI score by at least 18 points  in order to demonstrate clinically significant reduction in disability     Baseline  05/01/18: 18/100    Time  6    Period  Weeks    Status  New    Target Date  06/12/18      PT LONG TERM GOAL #3   Title  Pt will be able to return to a full 8 hour day of work without any increase in her unsteadiness/dizziness    Baseline  05/01/18: currently unable  to work due to symptoms    Time  6    Period  Weeks    Status  New    Target Date  06/12/18            Plan - 05/22/18 2034    Clinical Impression Statement  Pt is making good progress with therapy although subjectively she is frustrated and anxious following her VNG study with ENT. She demonstrates improved stability today and she denies dizziness during gaze stabilization exercises today. Pt provided progression of HEP and encouraged to follow-up as scheduled. Pt will benefit from PT services to address deficits in strength, balance, and mobility in order to return to full function at home.    Rehab Potential  Fair    PT Frequency  1x / week    PT Duration  6 weeks    PT Treatment/Interventions  Other (comment);Therapeutic exercise;Therapeutic activities;Manual techniques;Cryotherapy;Electrical Stimulation;Moist Heat;Iontophoresis 4mg /ml Dexamethasone;Ultrasound;ADLs/Self Care Home Management;Aquatic Therapy;Canalith Repostioning;Traction;Functional mobility training;Gait training;Balance training;Neuromuscular re-education;Patient/family education;Dry needling;Vestibular   iontophoresis with dexamethasone   PT Next Visit Plan  Review HEP, continue with additional habituation and balance exercises    PT Home Exercise Plan  VOR x 1 horizontal in standing on foam NBOS conflicting background, semitandem balance with horizontal head turns;    Consulted and Agree with Plan of Care  Patient       Patient will benefit from skilled therapeutic intervention in order to improve the following deficits and impairments:  Decreased balance,  Dizziness, Difficulty walking  Visit Diagnosis: Dizziness and giddiness  Unsteadiness on feet     Problem List Patient Active Problem List   Diagnosis Date Noted  . Near syncope 04/22/2018  . Costochondritis 12/26/2015  . Allergic state 02/09/2015  . Adult hypothyroidism 02/09/2015  . OP (osteoporosis) 02/09/2015  . AV (anaerobic vaginosis) 11/18/2014  . Non-traumatic rotator cuff tear 09/29/2013   Phillips Grout PT, DPT, GCS  , 05/22/2018, 8:43 PM  [place] MAIN [facility] SERVICES [address] [place], [place], [address] Phone: [phone]   Fax:  [phone]  Name: [name] MRN: [id number] Date of Birth: [birth year]

## 2018-05-26 DIAGNOSIS — R42 Dizziness and giddiness: Secondary | ICD-10-CM | POA: Diagnosis not present

## 2018-05-29 ENCOUNTER — Ambulatory Visit: Payer: Commercial Managed Care - PPO

## 2018-05-29 DIAGNOSIS — R2681 Unsteadiness on feet: Secondary | ICD-10-CM

## 2018-05-29 DIAGNOSIS — R42 Dizziness and giddiness: Secondary | ICD-10-CM

## 2018-05-29 NOTE — Therapy (Signed)
[place] MAIN [facility] SERVICES [address] [place], [place], [address] Phone: [phone]   Fax:  [phone]  Physical Therapy Treatment  Patient Details  Name: [name] MRN: [id number] Date of Birth: January 09, 1951 Referring Provider (PT): Carloyn Manner   Encounter Date: 05/29/2018  PT End of Session - 05/31/18 1224    Visit Number  4    Number of Visits  7    Date for PT Re-Evaluation  06/12/18    Authorization Type  Eval 05/01/18    PT Start Time  0808    PT Stop Time  0853    PT Time Calculation (min)  45 min    Equipment Utilized During Treatment  Gait belt    Activity Tolerance  Patient tolerated treatment well    Behavior During Therapy  [facility] for tasks assessed/performed       History reviewed. No pertinent past medical history.  Past Surgical History:  Procedure Laterality Date  . KNEE SURGERY    . TOTAL VAGINAL HYSTERECTOMY      There were no vitals filed for this visit.  Subjective Assessment - 05/31/18 1224    Subjective  Pt feels like she is continuing to improve with respect to her symptoms. She has remained very active and hasn't been having any issues with imbalance or dizziness at work. She struggles the most with walking to the bathroom in the middle of the night.     Pertinent History  [name]  is a 68 y.o. female who presented to the emergency room for severe vertigo and nausea on 04/22/18 stating she felt like she was about to pass out. Symptoms started while she was standing at her desk at work. She reports that she sat down and then suddenly started to experience vertigo, vomiting, and sweating. Upon questioning today she states that she didn't feel like she was actually going to pass out but just couldn't "control my body."  She was evaluated in the emergency room with a head CT and brain MRI which showed no acute abnormalities. Per ED note pt was having persistent nystagmus which did not fatigue. Symptoms  did not improve and pt reports that she couldn't open her eyes without experiencing severe vertigo. Hospitalist service was consulted due to persistent symptoms and pt was admitted. She reports that by the tiime she reached her room she was starting to improve but still couldn't stand or walk due to imbalance. She saw ENT in the hospital and was prescribed a steroid and scheduled to follow-up as an outpatient. She saw ENT as an outpatient on 04/24/18 and pt describes them performing a Dix-Hallpike test but did not do any further testing. She was diagnosed with "vertigo" and was scheduled for a VNG study on 05/15/18. Pt states that have not performed an audiogram. Pt has been under a lot of stress recently with some work related issues and reports some anxiety around the time that her symptoms started. She  denies history of migraine headaches.  She recently finished an Abx prescription for a sinus infection and had also had a dental crown 2 days before her symptoms started. Pt reports approximately 80% improvement in symptoms since they first started.     How long can you sit comfortably?  no limitations    How long can you stand comfortably?  no limitations    How long can you walk comfortably?  no limitations    Diagnostic tests  MRI and head CT  without acute abnormalities    Patient Stated Goals  Return to full function without unsteadiness    Currently in Pain?  No/denies          TREATMENT   Neuromuscular Re-education   VOR VOR x 1 horizontal standing NBOS conflicting background onfoam 60s x 2 (1/10 dizziness reported). Less unsteadiness noted today.  VOR x 1 horizontal with forward/retro ambulation 35' x 3 each, mild unsteadiness noted during retro ambulation, pt reports that this is the most aggravating activity she has performed with respect to her dizziness;   Airex Airex balance NBOS with ball passes between baskets of varying height with full 180 turns from waist x multiple  bouts each direction, notable instability when turning to the left;   Hallway Ambulation Vertical ball toss during gait with head/eye follow 75' x 2; Horizontal ball toss with therapist during gait with head/eye follow 75' x 2; Forward ambulation with bounce pass to therapist with head/eye follow x 75'; Retro ambulation with horizontal ball toss with therapist 43' x 2; Forward ambulation with eyes closed x 75'; Retro ambulation with eyes closed 75' x 2, notable drift to the left with frequent correction required;   Body Rolls Body rolls on wall with eyes open x 2 each direction, no instability; Body rolls on wall with eyes closed x 2 each direction, no instability; Body rolls off wall with eyes open x 1 each direction, no instability; Body roll off wall with eyes closed x 2 each direction, no instability;   Semitandem Semitandembalance with horizontal head turns alternating forward LE x 30s each; Full tandem balance with horizontal head turns alternating forward LE x 30s each;   Pt educated throughout session about proper posture and technique with exercises. Improved exercise technique, movement at target joints after min to mod verbal, visual, tactile cues.   Pt is making good progress with therapy and continues to progress with respect to the challenge of her exercises. She demonstrates improved stability today and she denies dizziness during gaze stabilization exercises today so advanced to forward/retro ambulation. She reports that retro ambulation is the most aggravating with respect to her dizziness. Pt encouraged to add forward/retro VOR x 1 horizontal to her HEP as long as she can perform safely. Will need updated outcome measures/goals at next visit. Pt provided progression of HEP and encouraged to follow-up as scheduled.Pt will benefit from PT services to address deficits in strength, balance, and mobility in order to return to full function at home.                          PT Short Term Goals - 05/01/18 1234      PT SHORT TERM GOAL #1   Title  Pt will be independent with HEP in order to improve strength and balance in order to decrease fall risk and improve function at home and work.     Time  3    Period  Weeks    Status  New    Target Date  05/22/18        PT Long Term Goals - 05/01/18 1234      PT LONG TERM GOAL #1   Title  Pt will improve DGI by at least 3 points in order to demonstrate clinically significant improvement in balance and decreased risk for falls     Baseline  05/01/18: 19/24    Time  6    Period  Weeks  Status  New    Target Date  06/12/18      PT LONG TERM GOAL #2   Title  Pt will decrease DHI score by at least 18 points in order to demonstrate clinically significant reduction in disability     Baseline  05/01/18: 18/100    Time  6    Period  Weeks    Status  New    Target Date  06/12/18      PT LONG TERM GOAL #3   Title  Pt will be able to return to a full 8 hour day of work without any increase in her unsteadiness/dizziness    Baseline  05/01/18: currently unable to work due to symptoms    Time  6    Period  Weeks    Status  New    Target Date  06/12/18            Plan - 05/31/18 1224    Clinical Impression Statement  Pt is making good progress with therapy and continues to progress with respect to the challenge of her exercises. She demonstrates improved stability today and she denies dizziness during gaze stabilization exercises today so advanced to forward/retro ambulation. She reports that retro ambulation is the most aggravating with respect to her dizziness. Pt encouraged to add forward/retro VOR x 1 horizontal to her HEP as long as she can perform safely. Will need updated outcome measures/goals at next visit. Pt provided progression of HEP and encouraged to follow-up as scheduled.Pt will benefit from PT services to address deficits in strength, balance, and  mobility in order to return to full function at home.     Rehab Potential  Fair    PT Frequency  1x / week    PT Duration  6 weeks    PT Treatment/Interventions  Other (comment);Therapeutic exercise;Therapeutic activities;Manual techniques;Cryotherapy;Electrical Stimulation;Moist Heat;Iontophoresis 4mg /ml Dexamethasone;Ultrasound;ADLs/Self Care Home Management;Aquatic Therapy;Canalith Repostioning;Traction;Functional mobility training;Gait training;Balance training;Neuromuscular re-education;Patient/family education;Dry needling;Vestibular   iontophoresis with dexamethasone   PT Next Visit Plan  Repeat outcome measures, update goals, review/progress HEP, continue with additional habituation and balance exercises    PT Home Exercise Plan  VOR x 1 horizontal in standing on foam NBOS conflicting background, semitandem balance with horizontal head turns;    Consulted and Agree with Plan of Care  Patient       Patient will benefit from skilled therapeutic intervention in order to improve the following deficits and impairments:  Decreased balance, Dizziness, Difficulty walking  Visit Diagnosis: Dizziness and giddiness  Unsteadiness on feet     Problem List Patient Active Problem List   Diagnosis Date Noted  . Near syncope 04/22/2018  . Costochondritis 12/26/2015  . Allergic state 02/09/2015  . Adult hypothyroidism 02/09/2015  . OP (osteoporosis) 02/09/2015  . AV (anaerobic vaginosis) 11/18/2014  . Non-traumatic rotator cuff tear 09/29/2013   Phillips Grout PT, DPT, GCS  Huprich,Jason 05/31/2018, 12:29 PM  [place] MAIN [facility] SERVICES [address] [place], [place], [address] Phone: [phone]   Fax:  [phone]  Name: [name] MRN: [id number] Date of Birth: 21-Jul-1950

## 2018-06-05 ENCOUNTER — Ambulatory Visit: Payer: Commercial Managed Care - PPO

## 2018-06-05 DIAGNOSIS — R2681 Unsteadiness on feet: Secondary | ICD-10-CM

## 2018-06-05 DIAGNOSIS — R42 Dizziness and giddiness: Secondary | ICD-10-CM

## 2018-06-05 NOTE — Therapy (Signed)
[place] MAIN [facility] SERVICES [address] [place], [place], [address] Phone: [phone]   Fax:  [phone]  Physical Therapy Treatment/Goal Update  Patient Details  Name: [name] MRN: [id number] Date of Birth: 1950-05-14 Referring Provider (PT): Carloyn Manner   Encounter Date: 06/05/2018  PT End of Session - 06/05/18 0912    Visit Number  5    Number of Visits  7    Date for PT Re-Evaluation  06/12/18    Authorization Type  Last goals: 06/05/18; Eval 05/01/18    PT Start Time  0803    PT Stop Time  0855    PT Time Calculation (min)  52 min    Equipment Utilized During Treatment  Gait belt    Activity Tolerance  Patient tolerated treatment well    Behavior During Therapy  WFL for tasks assessed/performed       History reviewed. No pertinent past medical history.  Past Surgical History:  Procedure Laterality Date  . KNEE SURGERY    . TOTAL VAGINAL HYSTERECTOMY      There were no vitals filed for this visit.  Subjective Assessment - 06/05/18 0907    Subjective  Pt feels like she is continuing to improve with respect to her symptoms although she has felt slightly worse over the last couple days due to increased sinus congestion. She is currently taking a nasal steroid to help with this issue.  She feels approximately 95% improved since her symptoms started. No specific questions at this time.     Pertinent History  [name]  is a 68 y.o. female who presented to the emergency room for severe vertigo and nausea on 04/22/18 stating she felt like she was about to pass out. Symptoms started while she was standing at her desk at work. She reports that she sat down and then suddenly started to experience vertigo, vomiting, and sweating. Upon questioning today she states that she didn't feel like she was actually going to pass out but just couldn't "control my body."  She was evaluated in the emergency room with a head CT and brain  MRI which showed no acute abnormalities. Per ED note pt was having persistent nystagmus which did not fatigue. Symptoms did not improve and pt reports that she couldn't open her eyes without experiencing severe vertigo. Hospitalist service was consulted due to persistent symptoms and pt was admitted. She reports that by the tiime she reached her room she was starting to improve but still couldn't stand or walk due to imbalance. She saw ENT in the hospital and was prescribed a steroid and scheduled to follow-up as an outpatient. She saw ENT as an outpatient on 04/24/18 and pt describes them performing a Dix-Hallpike test but did not do any further testing. She was diagnosed with "vertigo" and was scheduled for a VNG study on 05/15/18. Pt states that have not performed an audiogram. Pt has been under a lot of stress recently with some work related issues and reports some anxiety around the time that her symptoms started. She  denies history of migraine headaches.  She recently finished an Abx prescription for a sinus infection and had also had a dental crown 2 days before her symptoms started. Pt reports approximately 80% improvement in symptoms since they first started.     How long can you sit comfortably?  no limitations    How long can you stand comfortably?  no limitations    How long can  you walk comfortably?  no limitations    Diagnostic tests  MRI and head CT without acute abnormalities    Patient Stated Goals  Return to full function without unsteadiness    Currently in Pain?  No/denies         [facility] PT Assessment - 06/05/18 0915      Functional Gait  Assessment   Gait assessed   Yes    Gait Level Surface  Walks 20 ft in less than 5.5 sec, no assistive devices, good speed, no evidence for imbalance, normal gait pattern, deviates no more than 6 in outside of the 12 in walkway width.    Change in Gait Speed  Able to smoothly change walking speed without loss of balance or gait deviation. Deviate  no more than 6 in outside of the 12 in walkway width.    Gait with Horizontal Head Turns  Performs head turns smoothly with slight change in gait velocity (eg, minor disruption to smooth gait path), deviates 6-10 in outside 12 in walkway width, or uses an assistive device.    Gait with Vertical Head Turns  Performs task with slight change in gait velocity (eg, minor disruption to smooth gait path), deviates 6 - 10 in outside 12 in walkway width or uses assistive device    Gait and Pivot Turn  Pivot turns safely within 3 sec and stops quickly with no loss of balance.    Step Over Obstacle  Is able to step over 2 stacked shoe boxes taped together (9 in total height) without changing gait speed. No evidence of imbalance.    Gait with Narrow Base of Support  Ambulates less than 4 steps heel to toe or cannot perform without assistance.    Gait with Eyes Closed  Walks 20 ft, uses assistive device, slower speed, mild gait deviations, deviates 6-10 in outside 12 in walkway width. Ambulates 20 ft in less than 9 sec but greater than 7 sec.    Ambulating Backwards  Walks 20 ft, uses assistive device, slower speed, mild gait deviations, deviates 6-10 in outside 12 in walkway width.    Steps  Alternating feet, no rail.    Total Score  23         TREATMENT   Neuromuscular Re-education  Eyes examined with IR goggles. Pt continues to present with second degree nystagmus however it is much more faint than during previous sessions. Worsened post-headshake;  VOR VOR x 1 horizontal standing WBOS and then NBOS conflicting background [id number] x 1 each, no dizziness reported and no unsteadiness;  VOR x 1 horizontal with forward/retro ambulation 35' x 3 each, mild unsteadiness noted during retro ambulation but no dizziness reported;   Hallway Ambulation Vertical ball toss during gait with head/eye follow 75' x 2; Horizontal ball toss with therapist during gait with head/eye follow 75' x 2 each  direction; Forward/retro ambulation with bounce pass to therapist with head/eye follow x 75' each; Retro ambulation with horizontal ball toss with therapist 70' x 2; Forward ambulation with eyes closed x 75' x 2; Retro ambulation with eyes closed 75' x 2, notable drift to the left with frequent correction required; Retro ambulation with ball pass over shoulder and return catch on the opposite side with therapist varying height between waist and overhead 75' x 2;    Body Rolls Body roll off wall with eyes closed x 6 each direction, mild instability noted;   Semitandem Full tandem balance with horizontal head turns alternating forward LE  x 30s each;  Pt completed DHI: 0/100 (unbilled);  Performed FGA with patient who scored 23/30; Updated goals with patient;   Pt educated throughout session about proper posture and technique with exercises. Improved exercise technique, movement at target joints after min to mod verbal, visual, tactile cues.   Pt is making excellent progress with therapy. She reports approximately 95% improvement in symptoms since starting therapy. DHI is no 0/100. DGI improved to 22/24. FGA today is 23/30.She continues to demonstrate lateral deviation with head turns during ambulation and struggles with eyes closed as well as tandem walking. Pt provided progression of HEP and encouraged to follow-up as scheduled.Pt will benefit from PT services to address deficits in strength, balance, and mobility in order to return to full function at home.                        PT Short Term Goals - 06/05/18 0913      PT SHORT TERM GOAL #1   Title  Pt will be independent with HEP in order to improve strength and balance in order to decrease fall risk and improve function at home and work.     Time  3    Period  Weeks    Status  On-going        PT Long Term Goals - 06/05/18 0913      PT LONG TERM GOAL #1   Title  Pt will improve DGI by at  least 3 points in order to demonstrate clinically significant improvement in balance and decreased risk for falls     Baseline  05/01/18: 19/24; 06/05/18: 22/24    Time  6    Period  Weeks    Status  Achieved      PT LONG TERM GOAL #2   Title  Pt will decrease DHI score by at least 18 points in order to demonstrate clinically significant reduction in disability     Baseline  05/01/18: 18/100; 06/05/18: 0/100    Time  6    Period  Weeks    Status  Achieved      PT LONG TERM GOAL #3   Title  Pt will be able to return to a full 8 hour day of work without any increase in her unsteadiness/dizziness    Baseline  05/01/18: currently unable to work due to symptoms; 06/05/18: Working more than 8 hours    Time  6    Period  Weeks    Status  Achieved      PT LONG TERM GOAL #4   Title  Pt will improve FGI by at least 4 points in order to demonstrate clinically significant improvement in balance and decreased risk for falls     Baseline  06/05/18: 23/30    Time  6    Period  Weeks    Status  New    Target Date  06/22/18            Plan - 06/05/18 0913    Clinical Impression Statement  Pt is making excellent progress with therapy. She reports approximately 95% improvement in symptoms since starting therapy. DHI is no 0/100. DGI improved to 22/24. FGA today is 23/30.She continues to demonstrate lateral deviation with head turns during ambulation and struggles with eyes closed as well as tandem walking. Pt provided progression of HEP and encouraged to follow-up as scheduled.Pt will benefit from PT services to address deficits in strength, balance, and mobility in  order to return to full function at home.    Rehab Potential  Fair    PT Frequency  1x / week    PT Duration  6 weeks    PT Treatment/Interventions  Other (comment);Therapeutic exercise;Therapeutic activities;Manual techniques;Cryotherapy;Electrical Stimulation;Moist Heat;Iontophoresis 4mg /ml Dexamethasone;Ultrasound;ADLs/Self Care  Home Management;Aquatic Therapy;Canalith Repostioning;Traction;Functional mobility training;Gait training;Balance training;Neuromuscular re-education;Patient/family education;Dry needling;Vestibular   iontophoresis with dexamethasone   PT Next Visit Plan  review/progress HEP, continue with additional habituation and balance exercises    PT Home Exercise Plan  VOR x 1 horizontal with forward/backward ambulation, semitandem/tandem balance with horizontal head turns, eyes closed body rolls next to wall, forward/backward ambulation with eyes closed    Consulted and Agree with Plan of Care  Patient       Patient will benefit from skilled therapeutic intervention in order to improve the following deficits and impairments:  Decreased balance, Dizziness, Difficulty walking  Visit Diagnosis: Dizziness and giddiness  Unsteadiness on feet     Problem List Patient Active Problem List   Diagnosis Date Noted  . Near syncope 04/22/2018  . Costochondritis 12/26/2015  . Allergic state 02/09/2015  . Adult hypothyroidism 02/09/2015  . OP (osteoporosis) 02/09/2015  . AV (anaerobic vaginosis) 11/18/2014  . Non-traumatic rotator cuff tear 09/29/2013   [name] PT, DPT, GCS  [name] 06/05/2018, 9:32 AM  [place] MAIN [facility] SERVICES [address] [place], [place], [address] Phone: [phone]   Fax:  [phone]  Name: TIMBERLYNN KIZZIAH MRN: [id number] Date of Birth: [birth year]

## 2018-06-12 ENCOUNTER — Ambulatory Visit: Payer: Commercial Managed Care - PPO | Attending: Otolaryngology

## 2018-06-12 DIAGNOSIS — R2681 Unsteadiness on feet: Secondary | ICD-10-CM | POA: Diagnosis not present

## 2018-06-12 DIAGNOSIS — R42 Dizziness and giddiness: Secondary | ICD-10-CM | POA: Insufficient documentation

## 2018-06-12 NOTE — Patient Instructions (Signed)
Access Code: [email or web address]  URL: [email or web address]  Date: 06/12/2018  Prepared by: Roxana Hires   Exercises  Tandem Stance with Head Rotation - 3 reps - 30 seconds alternating forward LE hold - 2x daily - 7x weekly  Tandem Stance with Eyes Closed - 3 sets - 30 seconds alternating forward LE hold - 2x daily - 7x weekly  Walking Tandem Stance - 3 sets - 10 feet hold - 2x daily - 7x weekly  Single Leg Stance - 3 sets - alternating hold - 2x daily - 7x weekly

## 2018-06-12 NOTE — Therapy (Signed)
[place] MAIN [facility] SERVICES [address] [place], [place], [address] Phone: [phone]   Fax:  [phone]  Physical Therapy Treatment/Discharge Note  Patient Details  Name: [name] MRN: [id number] Date of Birth: 1950/08/28 Referring Provider (PT): Carloyn Manner   Encounter Date: 06/12/2018  PT End of Session - 06/12/18 0807    Visit Number  6    Number of Visits  7    Date for PT Re-Evaluation  06/12/18    Authorization Type  Last goals: 06/05/18; Eval 05/01/18    PT Start Time  0805    PT Stop Time  0850    PT Time Calculation (min)  45 min    Equipment Utilized During Treatment  Gait belt    Activity Tolerance  Patient tolerated treatment well    Behavior During Therapy  WFL for tasks assessed/performed       History reviewed. No pertinent past medical history.  Past Surgical History:  Procedure Laterality Date  . KNEE SURGERY    . TOTAL VAGINAL HYSTERECTOMY      There were no vitals filed for this visit.  Subjective Assessment - 06/12/18 0807    Subjective  Pt feels like she is continuing to improve with respect to her symptoms. Sinus congestiong has improved from last visit. She feels approximately 95% improved since her symptoms started. No specific questions at this time.     Pertinent History  [name]  is a 68 y.o. female who presented to the emergency room for severe vertigo and nausea on 04/22/18 stating she felt like she was about to pass out. Symptoms started while she was standing at her desk at work. She reports that she sat down and then suddenly started to experience vertigo, vomiting, and sweating. Upon questioning today she states that she didn't feel like she was actually going to pass out but just couldn't "control my body."  She was evaluated in the emergency room with a head CT and brain MRI which showed no acute abnormalities. Per ED note pt was having persistent nystagmus which did not fatigue.  Symptoms did not improve and pt reports that she couldn't open her eyes without experiencing severe vertigo. Hospitalist service was consulted due to persistent symptoms and pt was admitted. She reports that by the tiime she reached her room she was starting to improve but still couldn't stand or walk due to imbalance. She saw ENT in the hospital and was prescribed a steroid and scheduled to follow-up as an outpatient. She saw ENT as an outpatient on 04/24/18 and pt describes them performing a Dix-Hallpike test but did not do any further testing. She was diagnosed with "vertigo" and was scheduled for a VNG study on 05/15/18. Pt states that have not performed an audiogram. Pt has been under a lot of stress recently with some work related issues and reports some anxiety around the time that her symptoms started. She  denies history of migraine headaches.  She recently finished an Abx prescription for a sinus infection and had also had a dental crown 2 days before her symptoms started. Pt reports approximately 80% improvement in symptoms since they first started.     How long can you sit comfortably?  no limitations    How long can you stand comfortably?  no limitations    How long can you walk comfortably?  no limitations    Diagnostic tests  MRI and head CT without acute abnormalities  Patient Stated Goals  Return to full function without unsteadiness    Currently in Pain?  No/denies          TREATMENT   Neuromuscular Re-education  VOR VOR x 1 horizontal with forward/retro ambulation 35' x 3 each, no unsteadiness noted and no dizziness reported during retro ambulation but no dizziness reported;   Hallway Ambulation Vertical ball toss during forward/retro gait with head/eye follow 75' x 2; Horizontal ball toss with therapist during forward/retro gait with head/eye follow 75' x 2; Forward/retro ambulation with bounce pass to therapist with head/eye follow 75' x 2; Retro ambulation with  ball pass over shoulder and return catch on the opposite side with therapist varying height between waist and overhead 75' x 2;    Eyes Closed In fittness center with lights off and light blocking glasses performed: Forward ambulation with and without head turns; Retro ambulation with and without head turns; 180 degree quick turns to the L, R, and alternating Pt only experiences one crossover step and stumble but she is able to self-correct and does not require external support;   Semitandem Full tandem balance with horizontal head turns alternating forward LE 30s x 2 each; Semitandem balance with eyes closed 30s x 2; Tandem gait in // bars without UE support x 6 lengths;  Extensive HEP review with patient. Provided consolidated handouts with all exercises and directives about how to perform safely.   Pt educated throughout session about proper posture and technique with exercises. Improved exercise technique, movement at target joints after min to mod verbal, visual, tactile cues.   Pt made excellent progress with therapy. She reports approximately 95% improvement in symptoms since starting therapy. DHI is 0/100. DGI improved to 22/24. FGA is 23/30 however her most impaired item is tandem balance which pt reports was possibly an issue prior to starting therapy.She only demonstrates very mild lateral deviations with head turns during ambulation. She reports the most difficulty with walking in the dark. Performed extensive forward ambulation, backward ambulation, and turns in the dark today with only one cross-over step but no LOB requiring external support. Pt was provided an extensive HEP to continue daily and encouraged to return if symptoms don't continue to resolve or if they worsen. Pt is being discharged on this date based on her progress and agreement between patient and therapist.                       PT Short Term Goals - 06/12/18 0902      PT SHORT  TERM GOAL #1   Title  Pt will be independent with HEP in order to improve strength and balance in order to decrease fall risk and improve function at home and work.     Time  3    Period  Weeks    Status  Achieved        PT Long Term Goals - 06/12/18 0902      PT LONG TERM GOAL #1   Title  Pt will improve DGI by at least 3 points in order to demonstrate clinically significant improvement in balance and decreased risk for falls     Baseline  05/01/18: 19/24; 06/05/18: 22/24    Time  6    Period  Weeks    Status  Achieved      PT LONG TERM GOAL #2   Title  Pt will decrease DHI score by at least 18 points in order to demonstrate clinically significant  reduction in disability     Baseline  05/01/18: 18/100; 06/05/18: 0/100    Time  6    Period  Weeks    Status  Achieved      PT LONG TERM GOAL #3   Title  Pt will be able to return to a full 8 hour day of work without any increase in her unsteadiness/dizziness    Baseline  05/01/18: currently unable to work due to symptoms; 06/05/18: Working more than 8 hours    Time  6    Period  Weeks    Status  Achieved      PT LONG TERM GOAL #4   Title  Pt will improve FGI by at least 4 points in order to demonstrate clinically significant improvement in balance and decreased risk for falls     Baseline  06/05/18: 23/30    Time  6    Period  Weeks    Status  On-going            Plan - 06/12/18 2979    Clinical Impression Statement  Pt made excellent progress with therapy. She reports approximately 95% improvement in symptoms since starting therapy. DHI is 0/100. DGI improved to 22/24. FGA is 23/30 however her most impaired item is tandem balance which pt reports was possibly an issue prior to starting therapy.She only demonstrates very mild lateral deviations with head turns during ambulation. She reports the most difficulty with walking in the dark. Performed extensive forward ambulation, backward ambulation, and turns in the dark today  with only one cross-over step but no LOB requiring external support. Pt was provided an extensive HEP to continue daily and encouraged to return if symptoms don't continue to resolve or if they worsen. Pt is being discharged on this date based on her progress and agreement between patient and therapist.    Rehab Potential  Fair    PT Frequency  1x / week    PT Duration  6 weeks    PT Treatment/Interventions  Other (comment);Therapeutic exercise;Therapeutic activities;Manual techniques;Cryotherapy;Electrical Stimulation;Moist Heat;Iontophoresis 4mg /ml Dexamethasone;Ultrasound;ADLs/Self Care Home Management;Aquatic Therapy;Canalith Repostioning;Traction;Functional mobility training;Gait training;Balance training;Neuromuscular re-education;Patient/family education;Dry needling;Vestibular   iontophoresis with dexamethasone   PT Next Visit Plan  Discharge    PT Home Exercise Plan  VOR x 1 horizontal with forward/backward ambulation, semitandem/tandem balance with horizontal head turns, single leg balance, tandem gait, forward/backward ambulation with eyes closed ([id number])    Consulted and Agree with Plan of Care  Patient       Patient will benefit from skilled therapeutic intervention in order to improve the following deficits and impairments:  Decreased balance, Dizziness, Difficulty walking  Visit Diagnosis: Dizziness and giddiness  Unsteadiness on feet     Problem List Patient Active Problem List   Diagnosis Date Noted  . Near syncope 04/22/2018  . Costochondritis 12/26/2015  . Allergic state 02/09/2015  . Adult hypothyroidism 02/09/2015  . OP (osteoporosis) 02/09/2015  . AV (anaerobic vaginosis) 11/18/2014  . Non-traumatic rotator cuff tear 09/29/2013   [name] PT, DPT, GCS  Huprich,Jason 06/12/2018, 9:10 AM  [place] MAIN [facility] SERVICES [address] [place], [place], [address] Phone: [phone]   Fax:  [phone]  Name:  NADIAH CORBIT MRN: [id number] Date of Birth: 1950-06-03

## 2018-06-24 DIAGNOSIS — C44311 Basal cell carcinoma of skin of nose: Secondary | ICD-10-CM | POA: Diagnosis not present

## 2018-07-07 DIAGNOSIS — L821 Other seborrheic keratosis: Secondary | ICD-10-CM | POA: Diagnosis not present

## 2018-07-07 DIAGNOSIS — L720 Epidermal cyst: Secondary | ICD-10-CM | POA: Diagnosis not present

## 2018-07-07 DIAGNOSIS — L578 Other skin changes due to chronic exposure to nonionizing radiation: Secondary | ICD-10-CM | POA: Diagnosis not present

## 2018-07-07 DIAGNOSIS — D692 Other nonthrombocytopenic purpura: Secondary | ICD-10-CM | POA: Diagnosis not present

## 2018-07-07 DIAGNOSIS — L570 Actinic keratosis: Secondary | ICD-10-CM | POA: Diagnosis not present

## 2018-07-07 DIAGNOSIS — Z85828 Personal history of other malignant neoplasm of skin: Secondary | ICD-10-CM | POA: Diagnosis not present

## 2018-07-07 DIAGNOSIS — D485 Neoplasm of uncertain behavior of skin: Secondary | ICD-10-CM | POA: Diagnosis not present

## 2018-08-11 ENCOUNTER — Ambulatory Visit (INDEPENDENT_AMBULATORY_CARE_PROVIDER_SITE_OTHER): Payer: Self-pay | Admitting: Physician Assistant

## 2018-08-11 ENCOUNTER — Other Ambulatory Visit: Payer: Self-pay

## 2018-08-11 VITALS — BP 130/100 | HR 85 | Temp 98.1°F | Resp 16 | Wt 137.0 lb

## 2018-08-11 DIAGNOSIS — R350 Frequency of micturition: Secondary | ICD-10-CM

## 2018-08-11 DIAGNOSIS — R0781 Pleurodynia: Secondary | ICD-10-CM

## 2018-08-11 DIAGNOSIS — H9203 Otalgia, bilateral: Secondary | ICD-10-CM

## 2018-08-11 DIAGNOSIS — M8589 Other specified disorders of bone density and structure, multiple sites: Secondary | ICD-10-CM | POA: Insufficient documentation

## 2018-08-11 DIAGNOSIS — J0100 Acute maxillary sinusitis, unspecified: Secondary | ICD-10-CM

## 2018-08-11 LAB — POCT URINALYSIS DIPSTICK
Bilirubin, UA: NEGATIVE
Blood, UA: NEGATIVE
Glucose, UA: NEGATIVE
Ketones, UA: NEGATIVE
Leukocytes, UA: NEGATIVE
Nitrite, UA: NEGATIVE
Protein, UA: NEGATIVE
Spec Grav, UA: 1.015 (ref 1.010–1.025)
Urobilinogen, UA: 0.2 E.U./dL
pH, UA: 5 (ref 5.0–8.0)

## 2018-08-11 MED ORDER — DOXYCYCLINE HYCLATE 100 MG PO TABS: 100.0000 mg | ORAL_TABLET | Freq: Two times a day (BID) | ORAL | 0 refills | Status: AC

## 2018-08-11 NOTE — Patient Instructions (Addendum)
Thank you for choosing [facility] for your health care needs.  You have been diagnosed with sinusitis. Associated bilateral otalgia (ear pain), pleuritic chest pain (pain with deep inspiration), and increased urinary frequency.  Take antibiotic as prescribed: Meds ordered this encounter  Medications  . doxycycline (VIBRA-TABS) 100 MG tablet    Sig: Take 1 tablet (100 mg total) by mouth 2 (two) times daily for 7 days.    Dispense:  14 tablet    Refill:  0    Order Specific Question:   Supervising Provider    Answer:   Noemi Chapel [[id number]]   Take over the counter ibuprofen (Advil) for pain/discomfort. May continue to use over the counter antihistamine (Allegra or Zyrtec) and decongestant (such as Sudafed).  Stay hydrated. Empty bladder when you feel you need to.  Urinalysis reveals no evidence of urine infection (UTI) at this time.  Follow-up with urgent care or ED in two days if chest discomfort not improving, sooner with any worsening symptoms.  Follow-up with family physician or urgent care if urinary symptoms worsen or do not resolve within the next few days. At that time, recommend urine culture.  Hope you feel better soon!  Sinusitis, Adult Sinusitis is soreness and swelling (inflammation) of your sinuses. Sinuses are hollow spaces in the bones around your face. They are located:  Around your eyes.  In the middle of your forehead.  Behind your nose.  In your cheekbones. Your sinuses and nasal passages are lined with a fluid called mucus. Mucus drains out of your sinuses. Swelling can trap mucus in your sinuses. This lets germs (bacteria, virus, or fungus) grow, which leads to infection. Most of the time, this condition is caused by a virus. What are the causes? This condition is caused by:  Allergies.  Asthma.  Germs.  Things that block your nose or sinuses.  Growths in the nose (nasal polyps).  Chemicals or irritants in the air.  Fungus (rare). What  increases the risk? You are more likely to develop this condition if:  You have a weak body defense system (immune system).  You do a lot of swimming or diving.  You use nasal sprays too much.  You smoke. What are the signs or symptoms? The main symptoms of this condition are pain and a feeling of pressure around the sinuses. Other symptoms include:  Stuffy nose (congestion).  Runny nose (drainage).  Swelling and warmth in the sinuses.  Headache.  Toothache.  A cough that may get worse at night.  Mucus that collects in the throat or the back of the nose (postnasal drip).  Being unable to smell and taste.  Being very tired (fatigue).  A fever.  Sore throat.  Bad breath. How is this diagnosed? This condition is diagnosed based on:  Your symptoms.  Your medical history.  A physical exam.  Tests to find out if your condition is short-term (acute) or long-term (chronic). Your doctor may: ? Check your nose for growths (polyps). ? Check your sinuses using a tool that has a light (endoscope). ? Check for allergies or germs. ? Do imaging tests, such as an MRI or CT scan. How is this treated? Treatment for this condition depends on the cause and whether it is short-term or long-term.  If caused by a virus, your symptoms should go away on their own within 10 days. You may be given medicines to relieve symptoms. They include: ? Medicines that shrink swollen tissue in the nose. ? Medicines that  treat allergies (antihistamines). ? A spray that treats swelling of the nostrils. ? Rinses that help get rid of thick mucus in your nose (nasal saline washes).  If caused by bacteria, your doctor may wait to see if you will get better without treatment. You may be given antibiotic medicine if you have: ? A very bad infection. ? A weak body defense system.  If caused by growths in the nose, you may need to have surgery. Follow these instructions at home: Medicines  Take,  use, or apply over-the-counter and prescription medicines only as told by your doctor. These may include nasal sprays.  If you were prescribed an antibiotic medicine, take it as told by your doctor. Do not stop taking the antibiotic even if you start to feel better. Hydrate and humidify   Drink enough water to keep your pee (urine) pale yellow.  Use a cool mist humidifier to keep the humidity level in your home above 50%.  Breathe in steam for 10-15 minutes, 3-4 times a day, or as told by your doctor. You can do this in the bathroom while a hot shower is running.  Try not to spend time in cool or dry air. Rest  Rest as much as you can.  Sleep with your head raised (elevated).  Make sure you get enough sleep each night. General instructions   Put a warm, moist washcloth on your face 3-4 times a day, or as often as told by your doctor. This will help with discomfort.  Wash your hands often with soap and water. If there is no soap and water, use hand sanitizer.  Do not smoke. Avoid being around people who are smoking (secondhand smoke).  Keep all follow-up visits as told by your doctor. This is important. Contact a doctor if:  You have a fever.  Your symptoms get worse.  Your symptoms do not get better within 10 days. Get help right away if:  You have a very bad headache.  You cannot stop throwing up (vomiting).  You have very bad pain or swelling around your face or eyes.  You have trouble seeing.  You feel confused.  Your neck is stiff.  You have trouble breathing. Summary  Sinusitis is swelling of your sinuses. Sinuses are hollow spaces in the bones around your face.  This condition is caused by tissues in your nose that become inflamed or swollen. This traps germs. These can lead to infection.  If you were prescribed an antibiotic medicine, take it as told by your doctor. Do not stop taking it even if you start to feel better.  Keep all follow-up visits as  told by your doctor. This is important. This information is not intended to replace advice given to you by your health care provider. Make sure you discuss any questions you have with your health care provider. Document Released: 10/09/2007 Document Revised: 09/22/2017 Document Reviewed: 09/22/2017 Elsevier Interactive Patient Education  2019 [employer].

## 2018-08-11 NOTE — Progress Notes (Signed)
Patient ID: DRUANNE BOSQUES DOB: 1950-11-18 AGE: 68 y.o. MRN: [id number]   PCP: Adin Hector, MD   Chief Complaint:  Chief Complaint  Patient presents with  . Sinus Problem    x 1 week  . Urinary Tract Infection    x 1 day     Subjective:    HPI:  [name] is a 68 y.o. female presents for evaluation  Chief Complaint  Patient presents with  . Sinus Problem    x 1 week  . Urinary Tract Infection    x 41 day   68 year old female presents to [facility] with two complaints. First complaint: Patient reports one week history of sinus symptoms. Reports nasal congestion, bilateral maxillary sinus pressure/congestion, bilateral ear pain, and postnasal drip. Gradually worsening. Tenderness over maxillary sinuses. Associated upper dental pain. Associated ear pressure/fullness sensation. Has been taking OTC Allegra, Advil Cold & Sinus, and Flonase with minimal improvement. Used NettiPot with no improvement. Patient states with URI symptoms she has had left chest discomfort. Soreness with deep inspiration. Located just left of sternum. Has used previously prescribed albuterol inhaler with no improvement. No tenderness with palpation over chest. No change in discomfort with position or physical exertion. Denies chest injury/trauma. Denies previous history of similar chest pain. Denies palpitations, racing heart sensation, radiating pain, jaw/arm pain, calf erythema/edema/pain, recent period of immobilization (no long car/plane ride), recent surgery, use of exogenous estrogen, cancer diagnosis (other than skin cancer). Patient with no smoking history. No previous history of DVT/PE. Denies fever, chills, sweats, headache, body aches, fatigue, ear discharge/drainage, tinnitus, sore throat, cough, SOB, wheezing. Patient with no diagnosis of HTN or hyperlipidemia.  Patient denies known Covid19 exposure. Denies recent travel.   Patient with episode of persistent vertigo (BPPV). Seen  in ED on 04/22/2018, admitted to hospital. Prescribed Sterapred pak, Zofran, and Meclizine. Completed course of PT; last appointment 06/12/2018, reported 95% symptom resolution at that time. Patient states at night, when she rolls over (changing position), will have short duration of room spinning sensation; otherwise symptoms have resolved. Patient states during two day admission at hospital underwent cardiac evaluation including cardiac enzymes, EKG, echocardiogram, and carotid ultrasound. Patient has not had a cardiac stress test previously.  Patient last seen by [facility] on 03/27/2018 (f/u from URI visit on 03/06/2018) for sinusitis. Symptoms present for 3 weeks. Prescribed Augmentin. This is patient's last antibiotic, four months ago.  Second complaint: Patient reports two day history of increased urinary frequency. Patient states typically during the day she urinates once per hour, once at night. Yesterday was urinating every 30 minutes. Patient admits to drinking more water than per her normal yesterday. Denies increase in carbonated or caffeinated beverages (drinks one cup of coffee per day). Denies dysuria, foul odor to urine, cloudy appearance, gross hematuria, increased urinary urgency, urinary incontinence, vaginal rash/discharge/pruritis. Patient s/p hysterectomy. Denies concern for STD. Patient denies fever, chills, abdominal pain, flank/back pain.  A limited review of symptoms was performed, pertinent positives and negatives as mentioned in HPI.  The following portions of the patient's history were reviewed and updated as appropriate: allergies, current medications and past medical history.  Patient Active Problem List   Diagnosis Date Noted  . Osteopenia of multiple sites 08/11/2018  . Near syncope 04/22/2018  . Personal history of other malignant neoplasm of skin 09/24/2017  . Osteoarthritis of carpometacarpal (CMC) joint of thumb 09/05/2017  . Costochondritis 12/26/2015   . Allergic state 02/09/2015  . Adult  hypothyroidism 02/09/2015  . OP (osteoporosis) 02/09/2015  . AV (anaerobic vaginosis) 11/18/2014  . Non-traumatic rotator cuff tear 09/29/2013    Allergies  Allergen Reactions  . Iodinated Diagnostic Agents Hives    Current Outpatient Medications on File Prior to Visit  Medication Sig Dispense Refill  . albuterol (PROVENTIL HFA;VENTOLIN HFA) 108 (90 Base) MCG/ACT inhaler Inhale 2 puffs into the lungs every 4 (four) hours as needed for wheezing or shortness of breath (cough, shortness of breath or wheezing.). 1 Inhaler 1  . calcium carbonate (OS-CAL) 1250 (500 Ca) MG chewable tablet Chew 1 tablet by mouth daily.    . cholecalciferol (VITAMIN D) 1000 units tablet Take 1,000 Units by mouth daily.    . fexofenadine (ALLEGRA) 30 MG tablet Take 30 mg by mouth daily.     . fluticasone (FLONASE) 50 MCG/ACT nasal spray Place 1 spray into both nostrils daily.    . meclizine (ANTIVERT) 25 MG tablet Take 1 tablet (25 mg total) by mouth 3 (three) times daily as needed for dizziness or nausea. 30 tablet 0  . Multiple Vitamin (MULTI-VITAMINS) TABS Take by mouth.    . cetirizine (ZYRTEC) 10 MG chewable tablet Chew 10 mg by mouth daily.     No current facility-administered medications on file prior to visit.        Objective:   Vitals:   08/11/18 1436 08/11/18 1440  BP: (!) 132/100 (!) 130/100  Resp: 16   Temp: 98.1 F (36.7 C)   Pulse: 85bpm Pulse Ox: 98%  Wt Readings from Last 3 Encounters:  08/11/18 137 lb (62.1 kg)  04/22/18 135 lb 9.6 oz (61.5 kg)  03/27/18 138 lb (62.6 kg)    Physical Exam:   General Appearance:  Patient sitting comfortably on examination table. Conversational. Kermit Balo self-historian. In no acute distress. Afebrile.   Head:  Normocephalic, without obvious abnormality, atraumatic  Eyes:  PERRL, conjunctiva/corneas clear, EOM's intact  Ears:  Left ear canal WNL. No erythema or edema. No open wound. No visible purulent drainage.  No tenderness with palpation over left tragus or with manipulation of left auricle. No visible erythema or edema of left mastoid. No tenderness with palpation over left mastoid. Right ear canal WNL. No erythema or edema. No open wound. No visible purulent drainage. No tenderness with palpation over right tragus or with manipulation of right auricle. No visible erythema or edema of right mastoid. No tenderness with palpation over right mastoid. Left TM: Good light reflex. Visible landmarks. No erythema. No injection. Moderate serous effusion with minimal bulging, primarily at inferior aspect of TM. No visible purulent effusion. No tympanostomy tube. No scar tissue. Right TM: Good light reflex. Visible landmarks. No erythema. No injection. Minimal serous effusion with minimal bulging, primarily at inferior aspect of TM. No visible purulent effusion. No tympanostomy tube. No scar tissue.  Nose: Nares normal. Septum midline. No visible polyps. Nasal mucosa with bilateral edema. Clear rhinorrhea. No nasally sounding voice. Mild tenderness with palpation over bilateral maxillary sinuses, equal bilaterally.  Throat: Lips, mucosa, and tongue normal; teeth and gums normal. Throat reveals no erythema. No postnasal drip. No visible cobblestoning. Tonsils with no enlargement or exudate. Uvula midline with no edema or erythema.  Neck: Supple, symmetrical, trachea midline, no adenopathy  Lungs:   Lung auscultation reveals minimally diminished breath sounds in bases bilaterally. Patient able to take a good deep inspiration. No wheezing, rales, rhonchi, or crackles. No cough elicited with deep inspiration.   Heart:  Regular rate and rhythm, S1  and S2 normal, no murmur, rub, or gallop. No muffled heart sounds. No change in pain with leaning forward or lying flat. No tenderness with palpation along sternum or peristernal area.  Abdomen:   Normal to inspection. Normoactive bowel sounds. No tenderness with palpation; minimal  discomfort/pressure with palpation over suprapubic area. No guarding, rigidity or rebound tenderness. No palpable organomegaly. No CVA tenderness with percussion bilaterally.  Extremities: Extremities normal, atraumatic, no cyanosis or edema  Pulses: 2+ and symmetric  Skin: Skin color, texture, turgor normal, no rashes or lesions  Lymph nodes: Cervical, supraclavicular, and axillary nodes normal  Neurologic: Normal    Assessment & Plan:    Exam findings, diagnosis etiology and medication use and indications reviewed with patient. Follow-Up and discharge instructions provided. No emergent/urgent issues found on exam.  Patient education was provided.   Patient verbalized understanding of information provided and agrees with plan of care (POC), all questions answered. The patient is advised to call or return to clinic if condition does not see an improvement in symptoms, or to seek the care of the closest emergency department if condition worsens with the below plan.    Recent Results (from the past 2160 hour(s))  POCT Urinalysis Dipstick     Status: None   Collection Time: 08/11/18  2:53 PM  Result Value Ref Range   Color, UA yellow    Clarity, UA clear    Glucose, UA Negative Negative   Bilirubin, UA neg    Ketones, UA neg    Spec Grav, UA 1.015 1.010 - 1.025   Blood, UA neg    pH, UA 5.0 5.0 - 8.0   Protein, UA Negative Negative   Urobilinogen, UA 0.2 0.2 or 1.0 E.U./dL   Nitrite, UA negative    Leukocytes, UA Negative Negative   Appearance clear    Odor      1. Acute non-recurrent maxillary sinusitis - doxycycline (VIBRA-TABS) 100 MG tablet; Take 1 tablet (100 mg total) by mouth 2 (two) times daily for 7 days.  Dispense: 14 tablet; Refill: 0  2. Acute otalgia, bilateral  3. Pleuritic chest pain  4. Urinary frequency - POCT Urinalysis Dipstick  Patient with two complaints. Patient with one week of sinus pressure/congestion. Feels similar to patient's previous episodes  of sinusitis. However, associated left sided chest pain with inspiration. Advised patient go to the ED for further evaluation; best treatment would be CXR, EKG, and cardiac enzymes. Patient declined. VSS, afebrile, in no acute distress. PERC score 1; only risk factor age >40. Low suspicion for PE. Low suspicion for pericarditis; no muffled heart sounds, no change in discomfort with leaning forward/lying flat. Possibility chest discomfort due to pleurisy or costochondritis. Possibility chest discomfort due to pneumonia; especially given slightly diminished breath sounds in left base. Prescribed patient Doxycycline; good sinusitis and CAP coverage. Advised patient go to the ED in two days if chest discomfort not improving; sooner with any worsening symptoms.  Patient with increased urinary frequency. Wanted checked for UTI; however, suspected may be due to increased water consumption. UA negative for blood, glucose, nitrites, and leukocytes. Advised patient monitor symptoms, if increased urinary frequency does not resolve, or patient develops urinary urgency/dysuria, advised patient follow-up with PCP or urgent care for repeat UA with urine culture. Patient agrees with plan.  Patient charged to two [facility] visits.   [name], MHS, PA-C [name], MHS, PA-C Advanced Practice Provider [facility]  [place], [place]  [address] (p): [phone] [email or web address]

## 2018-08-14 ENCOUNTER — Telehealth: Payer: Self-pay

## 2018-08-14 NOTE — Telephone Encounter (Signed)
Contacted patient instructed patient per provider of recommendations and patient acknowledged understanding.

## 2018-08-14 NOTE — Telephone Encounter (Signed)
Patient stated she was feeling better but the medication makes her feel sick on her stomach. She is still taking the medication and trying to deal with the upset stomach. Patient uses [facility] [address]. Please advice

## 2018-08-14 NOTE — Telephone Encounter (Signed)
Glad patient is feeling better. The antibiotic, Doxycycline, can be tough on the stomach. Typically recommend taking Doxycycline with food, to help coat the stomach. May also use over the counter Pepcid (famotidine) to decrease stomach discomfort. Recommend taking Doxycyline pill with full glass of water.  Hope this helps.  Thanks SFS PA-C

## 2018-08-26 ENCOUNTER — Telehealth: Payer: 59 | Admitting: Family

## 2018-08-26 ENCOUNTER — Ambulatory Visit (INDEPENDENT_AMBULATORY_CARE_PROVIDER_SITE_OTHER): Payer: Self-pay | Admitting: Physician Assistant

## 2018-08-26 ENCOUNTER — Other Ambulatory Visit: Payer: Self-pay

## 2018-08-26 VITALS — BP 142/100 | HR 80 | Temp 97.5°F | Resp 16 | Wt 138.0 lb

## 2018-08-26 DIAGNOSIS — Z23 Encounter for immunization: Secondary | ICD-10-CM

## 2018-08-26 DIAGNOSIS — D489 Neoplasm of uncertain behavior, unspecified: Secondary | ICD-10-CM

## 2018-08-26 DIAGNOSIS — S61411S Laceration without foreign body of right hand, sequela: Secondary | ICD-10-CM

## 2018-08-26 MED ORDER — CEFADROXIL 500 MG PO CAPS: 500.0000 mg | ORAL_CAPSULE | Freq: Two times a day (BID) | ORAL | 0 refills | Status: DC

## 2018-08-26 NOTE — Progress Notes (Signed)
Based on what you shared with me, I feel your condition warrants further evaluation and I recommend that you be seen for a face to face office visit.  I am concerned that you may need to have the area looked at by dermatology    NOTE: If you entered your credit card information for this eVisit, you will not be charged. You may see a "hold" on your card for the $35 but that hold will drop off and you will not have a charge processed.  If you are having a true medical emergency please call 911.  If you need an urgent face to face visit, [place] has four urgent care centers for your convenience.    PLEASE NOTE: THE [facility] LOCATIONS AND URGENT CARE CLINICS DO NOT HAVE THE TESTING FOR CORONAVIRUS COVID19 AVAILABLE.  IF YOU FEEL YOU NEED THIS TEST YOU MUST GO TO A TRIAGE LOCATION AT [facility]   [email or web address] to reserve your spot online an avoid wait times  [facility] [address], [address] [place], [place] [address] Modified hours of operation: Monday-Friday, 10 AM to 6 PM  Saturday & Sunday 10 AM to 4 PM *Across the street from [place] (New Address!) [address] [place], [place] [address] *Just off [address], across the road from [place] hours of operation: Monday-Friday, 10 AM to 5 PM  Closed Saturday & Sunday   The following sites will take your insurance:  . [facility]gent [place] a Provider at this Location  [address] [place], [place] [address] . 10 am to 8 pm Monday-Friday . 12 pm to 8 pm Saturday-Sunday   . [facility] at [place] a Provider at this Location  [address], [place], [place] [address] . 8 am to 8 pm Monday-Friday . 9 am to 6 pm Saturday . 11 am to 6 pm Sunday   . [facility] Urgent  Care at [place] Get Driving Directions  [address] [place], [place] [address] . 8 am to 8 pm Monday-Friday . 8 am to 4 pm Saturday-Sunday   Your e-visit answers were reviewed by a board certified advanced clinical practitioner to complete your personal care plan.  Thank you for using e-Visits.

## 2018-08-26 NOTE — Patient Instructions (Addendum)
Thank you for choosing [facility] for your health care needs.  You have been diagnosed with laceration of right hand (wound on your right hand).  Recommend continue to clean wound with normal soap and water, twice a day. At this time, recommend not covering wound. May apply bandage if doing "dirty" work, such as gardening, working on your car, Investment banker, operational. May use Mupirocin ointment; however, go a few days without using ointment (while you are on the oral antibiotic) to see if the wound drying helps.  May take over the counter Tylenol or ibuprofen for pain/discomfort.  Take prescription antibiotic as prescribed. Meds ordered this encounter  Medications  . cefadroxil (DURICEF) 500 MG capsule    Sig: Take 1 capsule (500 mg total) by mouth 2 (two) times daily.    Dispense:  14 capsule    Refill:  0    Order Specific Question:   Supervising Provider    Answer:   Noemi Chapel [[id number]]   Follow-up with family physician, urgent care, or [facility] in 4-5 days if not improving. Sooner with any worsening symptoms.  Wound Care, Adult Taking care of your wound properly can help to prevent pain, infection, and scarring. It can also help your wound to heal more quickly. How to care for your wound Wound care      Follow instructions from your health care provider about how to take care of your wound. Make sure you: ? Wash your hands with soap and water before you change the bandage (dressing). If soap and water are not available, use hand sanitizer. ? Change your dressing as told by your health care provider. ? Leave stitches (sutures), skin glue, or adhesive strips in place. These skin closures may need to stay in place for 2 weeks or longer. If adhesive strip edges start to loosen and curl up, you may trim the loose edges. Do not remove adhesive strips completely unless your health care provider tells you to do that.  Check your wound area every day for signs of infection. Check for: ?  Redness, swelling, or pain. ? Fluid or blood. ? Warmth. ? Pus or a bad smell.  Ask your health care provider if you should clean the wound with mild soap and water. Doing this may include: ? Using a clean towel to pat the wound dry after cleaning it. Do not rub or scrub the wound. ? Applying a cream or ointment. Do this only as told by your health care provider. ? Covering the incision with a clean dressing.  Ask your health care provider when you can leave the wound uncovered.  Keep the dressing dry until your health care provider says it can be removed. Do not take baths, swim, use a hot tub, or do anything that would put the wound underwater until your health care provider approves. Ask your health care provider if you can take showers. You may only be allowed to take sponge baths. Medicines   If you were prescribed an antibiotic medicine, cream, or ointment, take or use the antibiotic as told by your health care provider. Do not stop taking or using the antibiotic even if your condition improves.  Take over-the-counter and prescription medicines only as told by your health care provider. If you were prescribed pain medicine, take it 30 or more minutes before you do any wound care or as told by your health care provider. General instructions  Return to your normal activities as told by your health care provider. Ask  your health care provider what activities are safe.  Do not scratch or pick at the wound.  Do not use any products that contain nicotine or tobacco, such as cigarettes and e-cigarettes. These may delay wound healing. If you need help quitting, ask your health care provider.  Keep all follow-up visits as told by your health care provider. This is important.  Eat a diet that includes protein, vitamin A, vitamin C, and other nutrient-rich foods to help the wound heal. ? Foods rich in protein include meat, dairy, beans, nuts, and other sources. ? Foods rich in vitamin A  include carrots and dark green, leafy vegetables. ? Foods rich in vitamin C include citrus, tomatoes, and other fruits and vegetables. ? Nutrient-rich foods have protein, carbohydrates, fat, vitamins, or minerals. Eat a variety of healthy foods including vegetables, fruits, and whole grains. Contact a health care provider if:  You received a tetanus shot and you have swelling, severe pain, redness, or bleeding at the injection site.  Your pain is not controlled with medicine.  You have redness, swelling, or pain around the wound.  You have fluid or blood coming from the wound.  Your wound feels warm to the touch.  You have pus or a bad smell coming from the wound.  You have a fever or chills.  You are nauseous or you vomit.  You are dizzy. Get help right away if:  You have a red streak going away from your wound.  The edges of the wound open up and separate.  Your wound is bleeding, and the bleeding does not stop with gentle pressure.  You have a rash.  You faint.  You have trouble breathing. Summary  Always wash your hands with soap and water before changing your bandage (dressing).  To help with healing, eat foods that are rich in protein, vitamin A, vitamin C, and other nutrients.  Check your wound every day for signs of infection. Contact your health care provider if you suspect that your wound is infected. This information is not intended to replace advice given to you by your health care provider. Make sure you discuss any questions you have with your health care provider. Document Released: 01/30/2008 Document Revised: 06/03/2017 Document Reviewed: 11/07/2015 Elsevier Interactive Patient Education  2019 [employer].

## 2018-08-26 NOTE — Progress Notes (Signed)
Patient ID: TEHYA LEATH DOB: 1951-04-20 AGE: 68 y.o. MRN: [id number]   PCP: Adin Hector, MD   Chief Complaint:  Chief Complaint  Patient presents with  . Hand Pain    2wks     Subjective:    HPI:  Kerry-Anne YUKIE BERGERON is a 68 y.o. female presents for evaluation  Chief Complaint  Patient presents with  . Hand Pain    49wks    68 year old female presents to [facility] with wound to right hand. Occurred two weeks ago. Patient bumped dorsal aspect of right hand against corner/edge of cabinet. Caused v-shaped laceration. Moderate bleeding. Cleaned wound. Has been applying previously prescribed Mupirocin ointment with minimal change. Mild tenderness. Has developed mild edema and erythema. Patient states, she believes the past 2-3 days, has had scant purulent drainage from center portion. No streaking redness. Denies hand weakness or paresthesias. Patient denies fever, chills, sweats, body aches, nausea/vomiting.   Last tetanus vaccination, per PCP's office (Dr. Ramonita Lab MD with Internal Medicine at [facility] in [place]), 01/01/2010 (nine years ago).  Patient performed E-Visit this morning. Was advised to have a face to face office visit for further evaluation. Advised patient be seen by PCP or dermatologist.  Photo in media section.  Patient with previous history of basal cell carcinoma. One episode. Present on left side of face; between nasal bridge and left medial canthus. Underwent Moh's surgery per dermatology. Developed suspected secondary MRSA infection (no culture performed). Resolved with oral antibiotics. Patient also developed significant scar tissue; treated with steroid injections.  A limited review of symptoms was performed, pertinent positives and negatives as mentioned in HPI.  The following portions of the patient's history were reviewed and updated as appropriate: allergies, current medications and past medical history.  Patient Active Problem List    Diagnosis Date Noted  . Osteopenia of multiple sites 08/11/2018  . Near syncope 04/22/2018  . Personal history of other malignant neoplasm of skin 09/24/2017  . Osteoarthritis of carpometacarpal (CMC) joint of thumb 09/05/2017  . Costochondritis 12/26/2015  . Allergic state 02/09/2015  . Adult hypothyroidism 02/09/2015  . OP (osteoporosis) 02/09/2015  . AV (anaerobic vaginosis) 11/18/2014  . Non-traumatic rotator cuff tear 09/29/2013    Allergies  Allergen Reactions  . Iodinated Diagnostic Agents Hives    Current Outpatient Medications on File Prior to Visit  Medication Sig Dispense Refill  . albuterol (PROVENTIL HFA;VENTOLIN HFA) 108 (90 Base) MCG/ACT inhaler Inhale 2 puffs into the lungs every 4 (four) hours as needed for wheezing or shortness of breath (cough, shortness of breath or wheezing.). 1 Inhaler 1  . calcium carbonate (OS-CAL) 1250 (500 Ca) MG chewable tablet Chew 1 tablet by mouth daily.    . cholecalciferol (VITAMIN D) 1000 units tablet Take 1,000 Units by mouth daily.    . fexofenadine (ALLEGRA) 30 MG tablet Take 30 mg by mouth daily.     . fluticasone (FLONASE) 50 MCG/ACT nasal spray Place 1 spray into both nostrils daily.    . Multiple Vitamin (MULTI-VITAMINS) TABS Take by mouth.    . cetirizine (ZYRTEC) 10 MG chewable tablet Chew 10 mg by mouth daily.     No current facility-administered medications on file prior to visit.        Objective:   Vitals:   08/26/18 1237  BP: (!) 142/100  Pulse: 80  Resp: 16  Temp: (!) 97.5 F (36.4 C)  SpO2: 99%     Wt Readings from Last 3 Encounters:  08/26/18 138 lb (62.6 kg)  08/11/18 137 lb (62.1 kg)  04/22/18 135 lb 9.6 oz (61.5 kg)    Physical Exam:   General Appearance:  Patient sitting comfortably on examination table. Conversational. Kermit Balo self-historian. In no acute distress. Afebrile.   Head:  Normocephalic, without obvious abnormality, atraumatic  Eyes:  PERRL, conjunctiva/corneas clear, EOM's intact   Neck: Supple, symmetrical, trachea midline, no adenopathy  Lungs:   Clear to auscultation bilaterally, respirations unlabored. Good aeration. No rales, rhonchi, crackles or wheezing.  Heart:  Regular rate and rhythm, S1 and S2 normal, no murmur, rub, or gallop  Extremities: Extremities normal, atraumatic, no cyanosis or edema  Pulses: 2+ and symmetric  Skin: Right hand inspection reveals 1cm circular wound, located on dorsum of hand at mid 2nd metacarpal. Raised edges with pale pink appearance. Center portion with dried blood/scab. Scant peeling of skin along outer portion of wound. No dry/flaky skin. No streaking redness. No palpable warmth. Minimal tenderness with palpation. No palpable underlying induration or fluctuance. No discharge/drainage or bleeding with manual manipulation.   Lymph nodes: Cervical, supraclavicular, and axillary nodes normal  Neurologic: Normal    Assessment & Plan:    Exam findings, diagnosis etiology and medication use and indications reviewed with patient. Follow-Up and discharge instructions provided. No emergent/urgent issues found on exam.  Patient education was provided.   Patient verbalized understanding of information provided and agrees with plan of care (POC), all questions answered. The patient is advised to call or return to clinic if condition does not see an improvement in symptoms, or to seek the care of the closest emergency department if condition worsens with the below plan.    1. Laceration of right hand without foreign body, sequela - cefadroxil (DURICEF) 500 MG capsule; Take 1 capsule (500 mg total) by mouth 2 (two) times daily.  Dispense: 14 capsule; Refill: 0  2. Tetanus-diphtheria (Td) vaccination   Patient two weeks status post right hand laceration. Dehiscence of wound has caused slow healing; now wound healing by secondary intention. 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 First appearance concerning for basal cell carcinoma, especially given location and patient's history; however, history of injury makes carcinoma unlikely. Believe wound healing simply delayed. However, will prescribe short course of oral antibiotics, 7-day course of Cefadroxil 500mg  bid (last antibiotic, Doxycycline, 7 day course 100mg  bid on 08/11/2018) - given patient's report of purulent drainage. Advised continued wound care of cleaning wound with soap and water twice a day; advised against Mupirocin ointment and keeping wound covered 24/7 at this time (believe wound staying moist may be delaying healing as well). Advised patient follow-up with PCP, urgent care, or [facility] in 4-5 days if not improving. Sooner with any worsening symptoms. Patient agreed with plan.  Patient's tetanus vaccination 68 years old. Patient chose to have tetanus updated. Tetanus vaccination given today.  Photo from patient (for E-Visit) and from [facility] provider in media section.   [name], MHS, PA-C [name], MHS, PA-C Advanced Practice Provider [facility]  [address], [place] [address] (p): [phone] [email or web address]

## 2018-08-28 ENCOUNTER — Telehealth: Payer: Self-pay | Admitting: Emergency Medicine

## 2018-08-28 NOTE — Telephone Encounter (Signed)
Called to follow up with patient after recent [facility] visit. Patient states she is doing well.

## 2018-09-18 ENCOUNTER — Ambulatory Visit (INDEPENDENT_AMBULATORY_CARE_PROVIDER_SITE_OTHER): Payer: Self-pay | Admitting: Physician Assistant

## 2018-09-18 ENCOUNTER — Other Ambulatory Visit: Payer: Self-pay

## 2018-09-18 VITALS — BP 120/90 | HR 88 | Temp 98.1°F | Resp 16 | Wt 135.0 lb

## 2018-09-18 DIAGNOSIS — L988 Other specified disorders of the skin and subcutaneous tissue: Secondary | ICD-10-CM

## 2018-09-18 DIAGNOSIS — J3089 Other allergic rhinitis: Secondary | ICD-10-CM

## 2018-09-18 DIAGNOSIS — N649 Disorder of breast, unspecified: Secondary | ICD-10-CM

## 2018-09-18 DIAGNOSIS — W57XXXA Bitten or stung by nonvenomous insect and other nonvenomous arthropods, initial encounter: Secondary | ICD-10-CM

## 2018-09-18 MED ORDER — DOXYCYCLINE HYCLATE 100 MG PO TABS: 200.0000 mg | ORAL_TABLET | Freq: Every day | ORAL | 0 refills | Status: AC

## 2018-09-18 MED ORDER — MUPIROCIN CALCIUM 2 % EX CREA: 1.0000 "application " | TOPICAL_CREAM | Freq: Two times a day (BID) | CUTANEOUS | 0 refills | Status: DC

## 2018-09-18 MED ORDER — LEVOCETIRIZINE DIHYDROCHLORIDE 5 MG PO TABS: 5.0000 mg | ORAL_TABLET | Freq: Every evening | ORAL | 0 refills | Status: DC

## 2018-09-18 MED ORDER — MONTELUKAST SODIUM 10 MG PO TABS: 10.0000 mg | ORAL_TABLET | Freq: Every day | ORAL | 0 refills | Status: DC

## 2018-09-18 NOTE — Patient Instructions (Addendum)
Thank you for choosing [facility] for your health care needs.  You have been evaluated and treated for three medical conditions.  1. Allergic rhinitis due to other allergic trigger, unspecified seasonality - levocetirizine (XYZAL) 5 MG tablet; Take 1 tablet (5 mg total) by mouth every evening for 30 days.  Dispense: 30 tablet; Refill: 0 - montelukast (SINGULAIR) 10 MG tablet; Take 1 tablet (10 mg total) by mouth daily for 30 days.  Dispense: 30 tablet; Refill: 0  2. Skin lesion of breast - mupirocin cream (BACTROBAN) 2 %; Apply 1 application topically 2 (two) times daily.  Dispense: 15 g; Refill: 0  3. Tick bite, initial encounter - doxycycline (VIBRA-TABS) 100 MG tablet; Take 2 tablets (200 mg total) by mouth daily for 1 day.  Dispense: 2 tablet; Refill: 0   Take medications as prescribed.  For sinus symptoms: Continue to use Astelin nasal spray and Flonase nasal spray once a day. May use Afrin nasal spray (decongestant nasal spray), once or twice a day, x3 days. Then must STOP. May use saline nasal spray and/or NettiPot. May continue to use Allegra. Recommend taking Xyzal (prescribed today) at night. Use Singulair (prescribed today), as well, can help with allergy symptoms.  Follow-up with ENT (ear, nose, and throat specialist) for further management of recurrent sinus issues.  For skin lesion on left breast: May apply antibiotic ointment, Mupirocin (prescribed today). May help with wound healing. Concern skin lesion may be cancerous. Keep appointment with dermatologist in August 2020, for further evaluation and treatment. Dermatologist may biopsy skin lesion.  For tick bite history: Both sites of tick bites currently look good. Monitor for bull's eye rash or worsening redness (erythema migrans), fever/chills, body aches, fatigue, or other new/concerning symptom. You have been given a printed script for 200mg  of Doxycyline. Take dose immediately after tick bite, which may occur in  the future, will help prevent tick borne illness afterwards. May have tick tested in the future, [email or web address], it is the [place]. There is a cost associated if not a [place] resident.  Hope you feel better soon!   Tick Bite Information, Adult  Ticks are insects that can bite. Most ticks live in shrubs and grassy areas. They climb onto people and animals that go by. Then they bite. Some ticks carry germs that can make you sick. How can I prevent tick bites?  Use an insect repellent that has 20% or higher of the ingredients DEET, picaridin, or IR3535. Put this insect repellent on: ? Bare skin. ? The tops of your boots. ? Your pant legs. ? The ends of your sleeves.  If you use an insect repellent that has the ingredient permethrin, make sure to follow the instructions on the bottle. Treat the following: ? Clothing. ? Supplies. ? Boots. ? Tents.  Wear long sleeves, long pants, and light colors.  Tuck your pant legs into your socks.  Stay in the middle of the trail.  Try not to walk through long grass.  Before going inside your house, check your clothes, hair, and skin for ticks. Make sure to check your head, neck, armpits, waist, groin, and joint areas.  Check for ticks every day.  When you come indoors: ? Wash your clothes right away. ? Shower right away. ? Dry your clothes in a dryer on high heat for 60 minutes or more. What is the right way to remove a tick? Remove a tick from your skin as soon as possible.  To remove a  tick that is crawling on your skin: ? Go outdoors and brush the tick off. ? Use tape or a lint roller.  To remove a tick that is biting: ? Wash your hands. ? If you have latex gloves, put them on. ? Use tweezers, curved forceps, or a tick-removal tool to grasp the tick. Grasp the tick as close to your skin and as close to the tick's head as possible. ? Gently pull up until the tick lets go.  Try to keep the tick's head attached to  its body.  Do not twist or jerk the tick.  Do not squeeze or crush the tick. Do not try to remove a tick with heat, alcohol, petroleum jelly, or fingernail polish. How should I get rid of a tick? Here are some ways to get rid of a tick that is alive:  Place the tick in rubbing alcohol.  Place the tick in a bag or container you can close tightly.  Wrap the tick tightly in tape.  Flush the tick down the toilet. Contact a doctor if:  You have symptoms of a disease, such as: ? Pain in a muscle, joint, or bone. ? Trouble walking or moving your legs. ? Numbness in your legs. ? Inability to move (paralysis). ? A red rash that makes a circle (bull's-eye rash). ? Redness and swelling where the tick bit you. ? A fever. ? Throwing up (vomiting) over and over. ? Diarrhea. ? Weight loss. ? Tender and swollen lymph glands. ? Shortness of breath. ? Cough. ? Belly pain (abdominal pain). ? Headache. ? Being more tired than normal. ? A change in how alert (conscious) you are. ? Confusion. Get help right away if:  You cannot remove a tick.  A part of a tick breaks off and gets stuck in your skin.  You are feeling worse. Summary  Ticks may carry germs that can make you sick.  To prevent tick bites, wear long sleeves, long pants, and light colors. Use insect repellent. Follow the instructions on the bottle.  If the tick is biting, do not try to remove it with heat, alcohol, petroleum jelly, or fingernail polish.  Use tweezers, curved forceps, or a tick-removal tool to grasp the tick. Gently pull up until the tick lets go. Do not twist or jerk the tick. Do not squeeze or crush the tick.  If you have symptoms, contact a doctor. This information is not intended to replace advice given to you by your health care provider. Make sure you discuss any questions you have with your health care provider. Document Released: 07/17/2009 Document Revised: 08/02/2016 Document Reviewed: 08/02/2016  Elsevier Interactive Patient Education  2019 [employer] Allergic Rhinitis, Adult Allergic rhinitis is a reaction to allergens in the air. Allergens are tiny specks (particles) in the air that cause your body to have an allergic reaction. This condition cannot be passed from person to person (is not contagious). Allergic rhinitis cannot be cured, but it can be controlled. There are two types of allergic rhinitis:  Seasonal. This type is also called hay fever. It happens only during certain times of the year.  Perennial. This type can happen at any time of the year. What are the causes? This condition may be caused by:  Pollen from grasses, trees, and weeds.  House dust mites.  Pet dander.  Mold. What are the signs or symptoms? Symptoms of this condition include:  Sneezing.  Runny or stuffy nose (nasal congestion).  A lot of  mucus in the back of the throat (postnasal drip).  Itchy nose.  Tearing of the eyes.  Trouble sleeping.  Being sleepy during day. How is this treated? There is no cure for this condition. You should avoid things that trigger your symptoms (allergens). Treatment can help to relieve symptoms. This may include:  Medicines that block allergy symptoms, such as antihistamines. These may be given as a shot, nasal spray, or pill.  Shots that are given until your body becomes less sensitive to the allergen (desensitization).  Stronger medicines, if all other treatments have not worked. Follow these instructions at home: Avoiding allergens   Find out what you are allergic to. Common allergens include smoke, dust, and pollen.  Avoid them if you can. These are some of the things that you can do to avoid allergens: ? Replace carpet with wood, tile, or vinyl flooring. Carpet can trap dander and dust. ? Clean any mold found in the home. ? Do not smoke. Do not allow smoking in your home. ? Change your heating and air conditioning filter at least once a  month. ? During allergy season:  Keep windows closed as much as you can. If possible, use air conditioning when there is a lot of pollen in the air.  Use a special filter for allergies with your furnace and air conditioner.  Plan outdoor activities when pollen counts are lowest. This is usually during the early morning or evening hours.  If you do go outdoors when pollen count is high, wear a special mask for people with allergies.  When you come indoors, take a shower and change your clothes before sitting on furniture or bedding. General instructions  Do not use fans in your home.  Do not hang clothes outside to dry.  Wear sunglasses to keep pollen out of your eyes.  Wash your hands right away after you touch household pets.  Take over-the-counter and prescription medicines only as told by your doctor.  Keep all follow-up visits as told by your doctor. This is important. Contact a doctor if:  You have a fever.  You have a cough that does not go away (is persistent).  You start to make whistling sounds when you breathe (wheeze).  Your symptoms do not get better with treatment.  You have thick fluid coming from your nose.  You start to have nosebleeds. Get help right away if:  Your tongue or your lips are swollen.  You have trouble breathing.  You feel dizzy or you feel like you are going to pass out (faint).  You have cold sweats. Summary  Allergic rhinitis is a reaction to allergens in the air.  This condition may be caused by allergens. These include pollen, dust mites, pet dander, and mold.  Symptoms include a runny, itchy nose, sneezing, or tearing eyes. You may also have trouble sleeping or feel sleepy during the day.  Treatment includes taking medicines and avoiding allergens. You may also get shots or take stronger medicines.  Get help if you have a fever or a cough that does not stop. Get help right away if you are short of breath. This information  is not intended to replace advice given to you by your health care provider. Make sure you discuss any questions you have with your health care provider. Document Released: 08/22/2010 Document Revised: 11/11/2017 Document Reviewed: 11/11/2017 Elsevier Interactive Patient Education  2019 [employer].

## 2018-09-18 NOTE — Progress Notes (Signed)
Patient ID: [name] DOB: 08/17/1950 AGE: 68 y.o. MRN: [id number]   PCP: Adin Hector, MD   Chief Complaint:  Chief Complaint  Patient presents with  . Rash    x81yr  . Sinusitis    x3d     Subjective:    HPI:  [name] is a 68 y.o. female presents for evaluation  Chief Complaint  Patient presents with  . Rash    x69yr  . Sinusitis    x40d    68 year old female presents to [facility] with multiple complaints.  First complaint: Patient reports three day history of sinus symptoms. Reports nasal congestion/pressure, postnasal drip with foul taste, bilateral ear fullness/pressure. Associated slight fatigue. Patient with chronic/recurrent sinusitis issues as well as seasonal allergies. Has been using Astelin nasal spray past few days (typically uses Flonase nasal spray, discontinued when starting Astelin). Takes Allegra daily.   Patient seen at [facility] on 08/11/2018 for sinus symptoms x 1wk and urinary symptoms x 1day. Patient prescribed 7-day course of Doxycycline 100mg  bid for sinusitis. UA benign. Advised patient f/u with PCP or urgent care for repeat UA and urine culture if symptoms persist.  Patient seen again at [facility] on 08/26/2018 with persistent right hand lesion. Concern for cellulitis. Patient prescribed Cefadroxil 500mg  bid x 7 days. Tdap also updated. Patient advised to f/u with dermatologist for further evaluation and treatment if lesion did not resolve, may need biopsy. Patient states lesion has significantly improved.  Patient has had two antibiotics in past 5 weeks.  Patient regularly followed by ENT. States has previously had imaging and been scoped; not advised to have sinus surgery.   Denies fever, chills, sweats, body aches, headache, ear pain, tinnitus, dental pain, rhinorrhea, sneezing, sore throat, cough, chest pain, SOB, wheezing. Patient currently working from home. Denies known Covid19 exposure. Denies  recent travel.  Second complaint: Patient reports one year history of lesion on left breast. Located at 10 o'clock position, few centimeters from nipple, not in areola area. Denies known injury/trauma. Patient states scabs over, scab falls off, then scabs over again. Minimal erythema. Minimal tenderness. Denies pruritis. Has not attempted any home remedy. Patient states lesion did improve slightly while on Cefadroxil course. Denies streaking redness, purulent drainage, nipple discharge/bleeding/purulent drainage, nipple inversion, breast pain. Denies personal or family history of breast cancer. Patient does have history of basal cell carcinoma; regularly seen by dermatologist, next appointment August 2020.  Patient last seen by Ob/Gyn on 04/08/2018 for Well woman exam at [facility], by Glenis Smoker CNM. Breast/skin exam documented, no rash mentioned.  Third complaint: Patient reports multiple tick bites. Patient has puppy at home; [place]. Very high energy. Patient has noticed two tick bites recently. One two weeks ago, located in right mid axillary area. One earlier this week, four days ago, left upper back. Patient states ticks were very small, fairly positive were Deer Ticks. Firmly attached. Patient removed completely with fingers. Unsure how long were attached. Denies pain or pruritis at source of bites. Denies rash at either bite. Denies fever, chills, sweats, body aches, nausea/vomiting, streaking redness, erythema migrans, bleeding/purulent drainage. Patient denies previous history of tick borne illness.  A limited review of symptoms was performed, pertinent positives and negatives as mentioned in HPI.  The following portions of the patient's history were reviewed and updated as appropriate: allergies, current medications and past medical history.  Patient Active Problem List   Diagnosis Date Noted  . Osteopenia of multiple  sites 08/11/2018  . Near syncope 04/22/2018  . Personal  history of other malignant neoplasm of skin 09/24/2017  . Osteoarthritis of carpometacarpal (CMC) joint of thumb 09/05/2017  . Costochondritis 12/26/2015  . Allergic state 02/09/2015  . Adult hypothyroidism 02/09/2015  . OP (osteoporosis) 02/09/2015  . AV (anaerobic vaginosis) 11/18/2014  . Non-traumatic rotator cuff tear 09/29/2013    Allergies  Allergen Reactions  . Iodinated Diagnostic Agents Hives    Current Outpatient Medications on File Prior to Visit  Medication Sig Dispense Refill  . albuterol (PROVENTIL HFA;VENTOLIN HFA) 108 (90 Base) MCG/ACT inhaler Inhale 2 puffs into the lungs every 4 (four) hours as needed for wheezing or shortness of breath (cough, shortness of breath or wheezing.). 1 Inhaler 1  . calcium carbonate (OS-CAL) 1250 (500 Ca) MG chewable tablet Chew 1 tablet by mouth daily.    . cetirizine (ZYRTEC) 10 MG chewable tablet Chew 10 mg by mouth daily.    . cholecalciferol (VITAMIN D) 1000 units tablet Take 1,000 Units by mouth daily.    . fexofenadine (ALLEGRA) 30 MG tablet Take 30 mg by mouth daily.     . fluticasone (FLONASE) 50 MCG/ACT nasal spray Place 1 spray into both nostrils daily.    . Multiple Vitamin (MULTI-VITAMINS) TABS Take by mouth.     No current facility-administered medications on file prior to visit.        Objective:   Vitals:   09/18/18 1221  BP: 120/90  Pulse: 88  Resp: 16  Temp: 98.1 F (36.7 C)  SpO2: 100%     Wt Readings from Last 3 Encounters:  09/18/18 135 lb (61.2 kg)  08/26/18 138 lb (62.6 kg)  08/11/18 137 lb (62.1 kg)    Physical Exam:   General Appearance:  Patient sitting comfortably on examination table. Conversational. Kermit Balo self-historian. In no acute distress. Afebrile.   Head:  Normocephalic, without obvious abnormality, atraumatic  Eyes:  PERRL, conjunctiva/corneas clear, EOM's intact  Ears:  Left ear canal WNL. No erythema or edema. No open wound. No visible purulent drainage. No tenderness with palpation  over left tragus or with manipulation of left auricle. No visible erythema or edema of left mastoid. No tenderness with palpation over left mastoid. Right ear canal WNL. No erythema or edema. No open wound. No visible purulent drainage. No tenderness with palpation over right tragus or with manipulation of right auricle. No visible erythema or edema of right mastoid. No tenderness with palpation over right mastoid. Left TM WNL. Good light reflex. Visible landmarks. No erythema. No injection. No bulging or retraction. No visible perforation. No serous effusion. No visible purulent effusion. No tympanostomy tube. No scar tissue. Right TM WNL. Good light reflex. Visible landmarks. No erythema. No injection. No bulging or retraction. No visible perforation. No serous effusion. No visible purulent effusion. No tympanostomy tube. No scar tissue.  Nose: Nares normal. Septum midline. No visible polyps. No discharge. Normal mucosa reveals mild edema and bogginess. Scant clear rhinorrhea. No nasally sounding voice. Mild tenderness with palpation along outer portion of nose. No sinus tenderness with percussion/palpation.  Throat: Lips, mucosa, and tongue normal; teeth and gums normal. Throat reveals no erythema. Very scant postnasal drip. No visible cobblestoning. Tonsils with no enlargement or exudate. Uvula midline with no edema or erythema.  Neck: Supple, symmetrical, trachea midline, no adenopathy  Lungs:   Clear to auscultation bilaterally, respirations unlabored. Good aeration. No rales, rhonchi, crackles or wheezing.  Heart:  Regular rate and rhythm, S1 and S2  normal, no murmur, rub, or gallop  Extremities: Extremities normal, atraumatic, no cyanosis or edema  Pulses: 2+ and symmetric  Skin: Left breast inspection reveals 1cm irregular shaped, ovular, skin lesion. Located at 10 o'clock position, 3cm from nipple, not overlying areola. Overlying scab. Scant dried blood in center. Very faint erythema along outer  border/edge. No streaking redness. No palpable warmth. No tenderness with palpation. No palpable underlying induration or fluctuance. No associated mass. No active bleeding. No purulent drainage.   0.5cm circular area of erythema in right axillary area. Patient identifies as area of tick bite. No bleeding. No purulent drainage. No streaking redness. No surrounding erythema. No erythema migrans. No tenderness with palpation. No axillary lymphadenopathy.  Back inspection reveals diffuse skin changes from sun exposure; multiple freckles, actinic keratosis and solar lentigos. Patient unable to localize where tick bite was located. No visible area of erythema.  Lymph nodes: Cervical, supraclavicular, and axillary nodes normal  Neurologic: Normal    Assessment & Plan:    Exam findings, diagnosis etiology and medication use and indications reviewed with patient. Follow-Up and discharge instructions provided. No emergent/urgent issues found on exam.  Patient education was provided.   Patient verbalized understanding of information provided and agrees with plan of care (POC), all questions answered. The patient is advised to call or return to clinic if condition does not see an improvement in symptoms, or to seek the care of the closest emergency department if condition worsens with the below plan.    1. Allergic rhinitis due to other allergic trigger, unspecified seasonality - levocetirizine (XYZAL) 5 MG tablet; Take 1 tablet (5 mg total) by mouth every evening for 30 days.  Dispense: 30 tablet; Refill: 0 - montelukast (SINGULAIR) 10 MG tablet; Take 1 tablet (10 mg total) by mouth daily for 30 days.  Dispense: 30 tablet; Refill: 0  2. Skin lesion of breast - mupirocin cream (BACTROBAN) 2 %; Apply 1 application topically 2 (two) times daily.  Dispense: 15 g; Refill: 0  3. Tick bite, initial encounter - doxycycline (VIBRA-TABS) 100 MG tablet; Take 2 tablets (200 mg total) by mouth daily for 1 day.   Dispense: 2 tablet; Refill: 0   Patient presents to [facility] with multiple complaints. First complaint: sinus pressure/congestion. Patient with three day history of sinus symptoms. VSS, afebrile, in no acute distress, benign physical exam, no evidence of AOM. Due to short duration of symptoms and correlation/history of allergic rhinitis; will treat for allergy etiology. Prescribed Xyzal for night time, Singulair for improved allergy control, and discussed use of Afrin (3 days), Astelin, and Flonase nasal spray. Patient prescribed Doxycycline 5 weeks ago and Cefadroxil 3 weeks ago; discussed hesitancy for [facility] to prescribe patient third antibiotic. Advised patient conservatively manage symptoms; if worsening, should follow-up with ENT for further evaluation, treatment, and continued management (discussion of sinusitis prevention).  Second complaint: skin lesion on left breast. Patient with one year history of skin lesion on left breast. Concern for skin carcinoma. History of basal cell carcinoma. Prescribed Mupirocin ointment; no indication currently of cellulitis and patient just completed course of Cefadroxil. Advised patient follow-up with dermatologist as scheduled in August 2020; can be evaluated by dermatologist, suspect patient may need skin biopsy.  Picture of skin lesion on left breast in media section.  Third complaint: multiple recent tick bites. Patient with two tick bites within past 2-3 weeks. Deer ticks. Firmly attached. Unsure how long. Given multiple recent antibiotics and patient being asymptomatic, do not believe extended Doxycycline  course indicated. Discussed regular skin checks. Quick/safe tick removal. Provided trustworthy/reliable tick testing lab in [place] (does have associated cost), for if patient interested in the future. Provided printed script for 200mg  dose of Doxycycline, to be taken after future tick bite. Also advised tick bite complaint could be performed via  E-Visit (which is free for patient).   Patient agreed with treatment plan.   [name], MHS, PA-C [name], MHS, PA-C Advanced Practice Provider [facility]  [address], [place] [address] (p): [phone] [email or web address]

## 2018-09-21 ENCOUNTER — Telehealth: Payer: Self-pay | Admitting: Emergency Medicine

## 2018-09-21 NOTE — Telephone Encounter (Signed)
Spoke with patient whom stated that she just not feeling well. Stated that she is hacking up green mucus now. Please advise

## 2018-09-22 NOTE — Telephone Encounter (Signed)
At this time, recommend Mucinex (can help thin the mucus) and Delsym (can help reduce cough). Increasing fluids will help thin the mucus. Honey can often help reduce cough.  Patient should be reseen by PCP or urgent care if she develops shortness of breath, wheezing, chest tightness, worsening cough, or fever. Patient can return to [facility] if symptoms do not improve in one week.  Thank you, Sam

## 2018-09-22 NOTE — Telephone Encounter (Signed)
Spoke to patient whom stated that she feels a little better. But per provider advised patient what she could do and also informed her if worsening to seek further care. Patient acknowledge understanding.

## 2018-10-16 ENCOUNTER — Ambulatory Visit: Payer: Self-pay | Attending: Internal Medicine

## 2018-10-16 ENCOUNTER — Other Ambulatory Visit: Payer: Self-pay

## 2018-10-16 DIAGNOSIS — R42 Dizziness and giddiness: Secondary | ICD-10-CM | POA: Insufficient documentation

## 2018-10-16 DIAGNOSIS — H8112 Benign paroxysmal vertigo, left ear: Secondary | ICD-10-CM | POA: Insufficient documentation

## 2018-10-16 NOTE — Therapy (Signed)
[place] MAIN [facility] SERVICES [address] [place], [place], [address] Phone: [phone]   Fax:  [phone]  Physical Therapy Screen  Patient Details  Name: [name] MRN: [id number] Date of Birth: [birth year] Referring Provider (PT): Carloyn Manner   Encounter Date: 10/16/2018    History reviewed. No pertinent past medical history.  Past Surgical History:  Procedure Laterality Date  . KNEE SURGERY    . TOTAL VAGINAL HYSTERECTOMY      There were no vitals filed for this visit.    PT/OT/SLP Screening Form   Time: in__0800____     Time out__0820___   Complaint ___Vertigo________________ Past Medical Hx:  __Osteoporosis, knee surgery, vestibular neuritis, RTC tear  Hx (this occurrence):  Pt states that a couple weeks after she finished vestibular therapy (06/12/18) for an acute vestibular neuritis she developed vertigo intermittently when rolling over in bed. She reports that she reached out to the ENT who said that it is not uncommon for patients to continue with some intermittent vertigo after an episode of acute neuritis. Pt states that it her symptoms only lasts for 20 seconds or so and then stops. It occurs when rolling to the left side. No other aggravating factors. Pt denies any neurological symptoms such as numbness/tingling, dysphagia, dysarthria, focal weakness, changes in vision, changes in hearing. Denies any other recent health changes. Per medical record pt with recent tick bites and evaluated at the [facility] clinic. Pt also with lesion on breast and referred to dermatologist for possible biopsy. Pt denies past history of CVA or any similar episodes.  Description of dizziness: (vertigo, unsteadiness, lightheadedness, falling, general unsteadiness, whoozy, swimmy-headed sensation, aural fullness) vertigo Frequency: Every couple days Duration: 20 seconds Symptom nature: (motion provoked, positional, spontaneous,  constant, variable, intermittent) Positional  Provocative Factors: Rolling to the left in bed Easing Factors: Resting for symptoms to resolve  Progression of symptoms: (better, worse, no change since onset) History of similar episodes: Prior history of vestibular neuritis but not similar to current symptoms  Falls (yes/no): No Number of falls in past 6 months: None reported  Prior Functional Level: Independent, works full time  Auditory complaints (tinnitus, pain, drainage): None Vision (last eye exam, diplopia, recent changes): No changes. Hx of cataract surgery.  Red Flags: (dysarthria, dysphagia, drop attacks, bowel and bladder changes, recent weight loss/gain) Review of systems negative for red flags.    Assessment:  POSTURE: Mild forward head and rounded shoulders  NEUROLOGICAL SCREEN: (2+ unless otherwise noted.) N=normal  Ab=abnormal  Level Dermatome R L Myotome R L Reflex R L  C3 Anterior Neck N N Sidebend C2-3 N N Jaw CN V    C4 Top of Shoulder N N Shoulder Shrug C4 N N Hoffman's UMN    C5 Lateral Upper Arm N N Shoulder ABD C4-5 N N Biceps C5-6    C6 Lateral Arm/ Thumb N N Arm Flex/ Wrist Ext C5-6 N N Brachiorad. C5-6    C7 Middle Finger N N Arm Ext//Wrist Flex C6-7 N N Triceps C7    C8 4th & 5th Finger N N Flex/ Ext Carpi Ulnaris C8 N N Patellar (L3-4)    T1 Medial Arm N N Interossei T1 N N Gastrocnemius    L2 Medial thigh/groin N N Illiopsoas (L2-3) N N     L3 Lower thigh/med.knee N N Quadriceps (L3-4) N N     L4 Medial leg/lat thigh N N Tibialis Ant (L4-5) N N     L5  Lat. leg & dorsal foot N N EHL (L5) N N     S1 post/lat foot/thigh/leg N N Gastrocnemius (S1-2) N N     S2 Post./med. thigh & leg N N Hamstrings (L4-S3) N N       SOMATOSENSORY:         Sensation           Intact      Diminished         Absent  Light touch Normal      COORDINATION: Finger to Nose: Normal Pronator Drift: Negative  MUSCULOSKELETAL SCREEN: Cervical Spine ROM: Mild loss of ROM  however generally WFL and painless in all planes.   ROM: No gross deficits identifed.  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 PT examination unremarkable with the exception of positive L Dix-Hallpike test for upbeating L torsional nystagmus lasting approximately 8-10 seconds with concurrent vertigo. 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 She would benefit from full vestibular evaluation and canalith repositioning treatment.    [x]  Patient would benefit from an MD referral []  Patient would benefit from a full PT/OT/ SLP evaluation and treatment. []  No intervention recommended at this time.                                    Objective measurements completed on examination: See above findings.                     Patient will benefit from skilled therapeutic intervention in order to improve the following deficits and impairments:     Visit Diagnosis: Dizziness and giddiness      Problem List Patient Active Problem List   Diagnosis Date Noted  . Osteopenia of multiple sites 08/11/2018  . Near syncope 04/22/2018  . Personal history of other malignant neoplasm of skin 09/24/2017  . Osteoarthritis of carpometacarpal (CMC) joint of thumb 09/05/2017  . Costochondritis 12/26/2015  . Allergic state 02/09/2015  . Adult hypothyroidism 02/09/2015  . OP (osteoporosis)  02/09/2015  . AV (anaerobic vaginosis) 11/18/2014  . Non-traumatic rotator cuff tear 09/29/2013   Phillips Grout PT, DPT, GCS  Olando Willems 10/16/2018, 8:37 AM  [place] MAIN [facility] SERVICES [address] [place], [place], [address] Phone: [phone]   Fax:  [phone]  Name: [name] MRN: [id number] Date of Birth: [birth year]

## 2018-10-19 ENCOUNTER — Ambulatory Visit: Payer: Self-pay

## 2018-10-21 ENCOUNTER — Ambulatory Visit: Payer: 59 | Attending: Internal Medicine

## 2018-10-21 ENCOUNTER — Other Ambulatory Visit: Payer: Self-pay

## 2018-10-21 DIAGNOSIS — R42 Dizziness and giddiness: Secondary | ICD-10-CM | POA: Insufficient documentation

## 2018-10-21 DIAGNOSIS — H8112 Benign paroxysmal vertigo, left ear: Secondary | ICD-10-CM | POA: Diagnosis not present

## 2018-10-21 NOTE — Therapy (Signed)
[place] MAIN [facility] SERVICES [address] [place], [place], [address] Phone: [phone]   Fax:  [phone]  Physical Therapy Evaluation  Patient Details  Name: [name] MRN: [id number] Date of Birth: 1951-01-14 Referring Provider (PT): Ramonita Lab   Encounter Date: 10/21/2018  PT End of Session - 10/21/18 1644    Visit Number  1    Number of Visits  7    Date for PT Re-Evaluation  12/02/18    PT Start Time  7026    PT Stop Time  1610    PT Time Calculation (min)  40 min    Activity Tolerance  Patient tolerated treatment well    Behavior During Therapy  [facility] for tasks assessed/performed       History reviewed. No pertinent past medical history.  Past Surgical History:  Procedure Laterality Date  . KNEE SURGERY    . TOTAL VAGINAL HYSTERECTOMY      There were no vitals filed for this visit.   Subjective Assessment - 10/21/18 1532    Subjective  Vertigo    Pertinent History  Pt states that a couple weeks after she finished vestibular therapy (06/12/18) for an acute vestibular neuritis she developed vertigo intermittently when rolling over in bed. She reports that she reached out to the ENT who said that it is not uncommon for patients to continue with some intermittent vertigo after an episode of acute neuritis. Pt states that it her symptoms only lasts for 20 seconds or so and then stops. It occurs when rolling to the left side. No other aggravating factors. Pt denies any neurological symptoms such as numbness/tingling, dysphagia, dysarthria, focal weakness, changes in vision, changes in hearing. Denies any other recent health changes. Per medical record pt with recent tick bites and evaluated at the [facility] clinic. Pt also with lesion on breast and referred to dermatologist for possible biopsy. Lesion has now resolved. Pt denies past history of CVA or any similar episodes.    How long can you sit comfortably?  no limitations    How  long can you stand comfortably?  no limitations    How long can you walk comfortably?  no limitations    Diagnostic tests  MRI and head CT without acute abnormalities during previous vertigo episode    Patient Stated Goals  Resolve symptoms    Currently in Pain?  No/denies         VESTIBULAR AND BALANCE EVALUATION   HISTORY:  Pt states that a couple weeks after she finished vestibular therapy (06/12/18) for an acute vestibular neuritis she developed vertigo intermittently when rolling over in bed. She reports that she reached out to the ENT who said that it is not uncommon for patients to continue with some intermittent vertigo after an episode of acute neuritis. Pt states that it her symptoms only lasts for 20 seconds or so and then stops. It occurs when rolling to the left side. No other aggravating factors. Pt denies any neurological symptoms such as numbness/tingling, dysphagia, dysarthria, focal weakness, changes in vision, changes in hearing. Denies any other recent health changes. Per medical record pt with recent tick bites and evaluated at the [facility] clinic. Pt also with lesion on breast and referred to dermatologist for possible biopsy. Lesion has now resolved. Pt denies past history of CVA or any similar episodes.  Description of dizziness: (vertigo, unsteadiness, lightheadedness, falling, general unsteadiness, whoozy, swimmy-headed sensation, aural fullness) vertigo Frequency: Every couple days Duration:  20 seconds Symptom nature: (motion provoked, positional, spontaneous, constant, variable, intermittent) Positional  Provocative Factors: Rolling to the left in bed Easing Factors: Resting for symptoms to resolve  Progression of symptoms: (better, worse, no change since onset) No change History of similar episodes: Prior history of vestibular neuritis but not similar to current symptoms  Falls (yes/no): No Number of falls in past 6 months: None reported  Prior Functional  Level: Independent, works full time  Auditory complaints (tinnitus, pain, drainage): None Vision (last eye exam, diplopia, recent changes): No changes. Hx of cataract surgery.  Red Flags: (dysarthria, dysphagia, drop attacks, bowel and bladder changes, recent weight loss/gain) Review of systems negative for red flags.     POSTURE: Mild forward head and rounded shoulders  NEUROLOGICAL SCREEN: (2+ unless otherwise noted.) N=normal  Ab=abnormal  Level Dermatome R L Myotome R L Reflex R L  C3 Anterior Neck  N N Sidebend C2-3 N N Jaw CN V    C4 Top of Shoulder N N Shoulder Shrug C4 N N Hoffman's UMN     C5 Lateral Upper Arm  N N Shoulder ABD C4-5 N N Biceps C5-6    C6 Lateral Arm/ Thumb  N N Arm Flex/ Wrist Ext C5-6 N N Brachiorad. C5-6     C7 Middle Finger  N N Arm Ext//Wrist Flex C6-7 N N Triceps C7     C8 4th & 5th Finger N N Flex/ Ext Carpi Ulnaris C8 N N Patellar (L3-4)     T1 Medial Arm N N Interossei T1 N N Gastrocnemius    L2 Medial thigh/groin N N Illiopsoas (L2-3) N N     L3 Lower thigh/med.knee N N Quadriceps (L3-4) N N     L4 Medial leg/lat thigh N N Tibialis Ant (L4-5) N N     L5 Lat. leg & dorsal foot N N EHL (L5) N N     S1 post/lat foot/thigh/leg N N Gastrocnemius (S1-2) N N     S2 Post./med. thigh & leg N N Hamstrings (L4-S3) N N       SOMATOSENSORY:         Sensation           Intact      Diminished         Absent  Light touch Normal                 COORDINATION: Finger to Nose: Normal Pronator Drift: Negative  MUSCULOSKELETAL SCREEN: Cervical Spine ROM:  Mild loss of ROM however generally WFL and painless in all planes.              ROM: No gross deficits identifed.  MMT: WFL to gross examination   OCULOMOTOR / VESTIBULAR TESTING: Deferred at this time  BPPV TESTS:  Symptoms Duration Intensity Nystagmus  L Dix-Hallpike vertigo 8-10 seconds, with a 2-3 second latency 5/10 Upbeating L torsional  R  Dix-Hallpike None   None  L Head Roll None   None  R Head Roll None   None  L Sidelying Test      R Sidelying Test        FUNCTIONAL OUTCOME MEASURES:  Results Comments  DHI    ABC Scale 100% WNL  DGI    DHI    10 meter Walking Speed          [facility] PT Assessment - 10/21/18 1639      Assessment   Medical Diagnosis  BPPV  Referring Provider (PT)  Ramonita Lab    Onset Date/Surgical Date  06/26/18   Approximate   Hand Dominance  Left    Next MD Visit  None reported    Prior Therapy  Yes for vestibular neuritis      Precautions   Precautions  None      Restrictions   Weight Bearing Restrictions  No    Other Position/Activity Restrictions  no      Balance Screen   Has the patient fallen in the past 6 months  No    Has the patient had a decrease in activity level because of a fear of falling?   No    Is the patient reluctant to leave their home because of a fear of falling?   No      Home Film/video editor residence    Living Arrangements  Spouse/significant other    Available Help at Discharge  Family    Type of [place] to enter    Entrance Stairs-Number of Steps  5    Entrance Stairs-Rails  Right    Home Layout  Two level    Alternate Level Stairs-Number of Steps  12    Alternate Level Stairs-Rails  Right    Home Equipment  None      Prior Function   Level of Independence  Independent    Vocation  Full time employment    Vocation Requirements  standing    Leisure  walking,       Cognition   Overall Cognitive Status  Within Functional Limits for tasks assessed      Observation/Other Assessments   Other Surveys   Activities of Balance and Confidence Scale    Activities of Balance Confidence Scale (ABC Scale)   100%                Objective measurements completed on examination: See above findings.     TREATMENT  Canalith Repositioning Maneuver Negative Dix-Hallpike on the  R, positive on the L for upbeating L torsional nystagmus lasting approximately 8-10 seconds after a  2-3 second latency. Concurrent vertigo reported of 5/10 intensity. Pt treated with 2 rounds of CRT with retesting between interventions. 2 minute holds in each position. During second left Dix-Hallpike test pt has no vertigo and possibly a couple apogeotrophic horizontal beats but very faint. No upbeating L torsional nystagmus observed. Pt taken through second round of CRT. Final retesting is completely negative on the L side for nystagmus or vertigo. Pt issued home Epley maneuver with extensive explanation about how to perform properly at home.                PT Education - 10/21/18 1643    Education Details  Home epley, plan of care    Person(s) Educated  Patient    Methods  Explanation;Handout;Demonstration    Comprehension  Verbalized understanding       PT Short Term Goals - 10/21/18 1649      PT SHORT TERM GOAL #1   Title  Pt will be independent with HEP in order to self-treat vertigo and fully resolve symptoms    Time  3    Period  Weeks    Status  New    Target Date  12/02/18        PT Long Term Goals - 10/21/18 1650      PT LONG TERM GOAL #1  Title  Pt will report no further bouts of vertigo when rolling in bed in order to resume normal sleeping without aggravation of symptoms or disruption in rest.    Baseline  10/21/18: Vertigo when rolling to the left in bed    Time  6    Period  Weeks    Status  New    Target Date  12/02/18             Plan - 10/21/18 1644    Clinical Impression Statement  Pt is a pleasant 68 year-old female who came to vestibular therapy for a free employee screen a week ago due to complaints of intermittent, short duration vertigo when rolling to the left in bed. PT examination at that time unremarkable with the exception of positive L Dix-Hallpike test. Pt remains positive today in the L Dix-Hallpike position for upbeating L  torsional nystagmus lasting approximately 8-10 seconds with concurrent 5/10 severity vertigo. 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 Pt treated with CRT for presumed L posterior canal BPPV with complete resolution of nystagmus and symptoms after intervention. Pt provided home Epley to perform daily and encouraged to follow-up in 1 week for retesting and hopefully discharge if symptoms have fully resolved. Otherwise pt will be treated with subsequent CRT until full resolution is obtained. Pt will benefit from PT services to address deficits in vertigo when rolling in bed in order to sleep through the night without waking up due to vertigo.    Personal Factors and Comorbidities  Age;Comorbidity 1;Comorbidity 2;Time since onset of injury/illness/exacerbation    Comorbidities  Prior history of vestibular neuritis, osteoporosis    Examination-Activity Limitations  Other   Rolling   Examination-Participation Restrictions  Other   Sleeping   Stability/Clinical Decision Making  Stable/Uncomplicated    Clinical Decision Making  Low    Clinical Presentation due to:  Clear presentation and easily treated, unchanged since onset    Rehab Potential  Excellent    PT Frequency  1x / week    PT Duration  6 weeks    PT Treatment/Interventions  Other (comment);Therapeutic exercise;Therapeutic activities;Manual techniques;Cryotherapy;Electrical Stimulation;Moist Heat;Iontophoresis 4mg /ml Dexamethasone;Ultrasound;ADLs/Self Care Home Management;Aquatic Therapy;Canalith Repostioning;Traction;Functional mobility training;Gait training;Balance training;Neuromuscular re-education;Patient/family education;Dry needling;Vestibular   iontophoresis with dexamethasone   PT Next Visit Plan  Recheck Dix-Hallpike and horizontal canal testing and treat as appropriate. Once clear can screen patient with additional bedside vestibular and/or balance testing as necessary.    PT Home Exercise Plan  Home Epley for L posterior canal  BPPV    Consulted and Agree with Plan of Care  Patient       Patient will benefit from skilled therapeutic intervention in order to improve the following deficits and impairments:  Dizziness  Visit Diagnosis: 1. Dizziness and giddiness   2. BPPV (benign paroxysmal positional vertigo), left        Problem List Patient Active Problem List   Diagnosis Date Noted  . Osteopenia of multiple sites 08/11/2018  . Near syncope 04/22/2018  . Personal history of other malignant neoplasm of skin 09/24/2017  . Osteoarthritis of carpometacarpal (CMC) joint of thumb 09/05/2017  . Costochondritis 12/26/2015  . Allergic state 02/09/2015  . Adult hypothyroidism 02/09/2015  . OP (osteoporosis) 02/09/2015  . AV (anaerobic vaginosis) 11/18/2014  . Non-traumatic rotator cuff tear 09/29/2013   Lyndel Safe Ellyse Rotolo PT, DPT, GCS  Brityn Mastrogiovanni 10/21/2018, 4:54 PM  [place] MAIN [facility] SERVICES [address] [place], [place], [address] Phone: [phone]  Fax:  [phone]  Name: [name] MRN: [id number] Date of Birth: 26-Oct-1950

## 2018-10-23 DIAGNOSIS — M62830 Muscle spasm of back: Secondary | ICD-10-CM | POA: Diagnosis not present

## 2018-10-23 DIAGNOSIS — M5136 Other intervertebral disc degeneration, lumbar region: Secondary | ICD-10-CM | POA: Diagnosis not present

## 2018-10-23 DIAGNOSIS — M461 Sacroiliitis, not elsewhere classified: Secondary | ICD-10-CM | POA: Diagnosis not present

## 2018-10-30 ENCOUNTER — Ambulatory Visit: Payer: Self-pay

## 2018-10-30 ENCOUNTER — Other Ambulatory Visit: Payer: Self-pay

## 2018-10-30 DIAGNOSIS — R42 Dizziness and giddiness: Secondary | ICD-10-CM | POA: Diagnosis not present

## 2018-10-30 DIAGNOSIS — H8112 Benign paroxysmal vertigo, left ear: Secondary | ICD-10-CM | POA: Diagnosis not present

## 2018-10-30 DIAGNOSIS — H26492 Other secondary cataract, left eye: Secondary | ICD-10-CM | POA: Diagnosis not present

## 2018-10-30 DIAGNOSIS — H353131 Nonexudative age-related macular degeneration, bilateral, early dry stage: Secondary | ICD-10-CM | POA: Diagnosis not present

## 2018-10-30 NOTE — Therapy (Signed)
[place] MAIN [facility] SERVICES [address] [place], [place], [address] Phone: [phone]   Fax:  [phone]  Physical Therapy Treatment/Discharge  Patient Details  Name: [name] MRN: [id number] Date of Birth: [birth year] Referring Provider (PT): Ramonita Lab   Encounter Date: 10/30/2018  PT End of Session - 10/30/18 0922    Visit Number  2    Number of Visits  7    Date for PT Re-Evaluation  12/02/18    PT Start Time  0800    PT Stop Time  0815    PT Time Calculation (min)  15 min    Activity Tolerance  Patient tolerated treatment well    Behavior During Therapy  [facility] for tasks assessed/performed       History reviewed. No pertinent past medical history.  Past Surgical History:  Procedure Laterality Date  . KNEE SURGERY    . TOTAL VAGINAL HYSTERECTOMY      There were no vitals filed for this visit.  Subjective Assessment - 10/30/18 0921    Subjective  Pt reports that she is doing well. She has been performing her home Epley repositioning treatment and has not had any further vertigo since her initial evaluation. She has been able to roll in bed without symptoms. No specific questions or concerns at this time.    Pertinent History  Pt states that a couple weeks after she finished vestibular therapy (06/12/18) for an acute vestibular neuritis she developed vertigo intermittently when rolling over in bed. She reports that she reached out to the ENT who said that it is not uncommon for patients to continue with some intermittent vertigo after an episode of acute neuritis. Pt states that it her symptoms only lasts for 20 seconds or so and then stops. It occurs when rolling to the left side. No other aggravating factors. Pt denies any neurological symptoms such as numbness/tingling, dysphagia, dysarthria, focal weakness, changes in vision, changes in hearing. Denies any other recent health changes. Per medical record pt with recent tick bites  and evaluated at the [facility] clinic. Pt also with lesion on breast and referred to dermatologist for possible biopsy. Lesion has now resolved. Pt denies past history of CVA or any similar episodes.    How long can you sit comfortably?  no limitations    How long can you stand comfortably?  no limitations    How long can you walk comfortably?  no limitations    Diagnostic tests  MRI and head CT without acute abnormalities during previous vertigo episode    Patient Stated Goals  Resolve symptoms    Currently in Pain?  No/denies         TREATMENT   OCULOMOTOR / VESTIBULAR TESTING:  Oculomotor Exam- Room Light  Findings Comments  Ocular Alignment normal   Ocular ROM normal   Spontaneous Nystagmus normal   End-Gaze Nystagmus normal   Smooth Pursuit normal   Saccades normal   VOR normal   VOR Cancellation normal   Left Head Thrust abnormal Positive L head thrust with corrective saccade requiring for 3/3 trials  Right Head Thrust normal   Head Shaking Nystagmus not examined   Static Acuity not examined   Dynamic Acuity not examined     Oculomotor Exam- Fixation Suppressed  Findings Comments  Ocular Alignment normal   Ocular ROM normal   Spontaneous Nystagmus abnormal Faint R horizontal beating nystagmus at mid-gaze, more vigorous with R gaze, absent with left gaze (2nd  degree nystagmus following Alexander's Law)  End-Gaze Nystagmus abnormal See above  Head Shaking Nystagmus not examined     BPPV TESTS:  Symptoms Duration Intensity Nystagmus  L Dix-Hallpike None   None  R Dix-Hallpike None   None  L Head Roll None   None  R Head Roll None   None  L Sidelying Test      R Sidelying Test          Performed Dix-Hallpike and roll testing with patient today which are negative bilaterally. She reports no further vertigo since her initial PT evaluation and treatment. Performed additional oculomotor/vestibular testing which reveals positive L head thrust and 2nd degree R  horizontal nystagmus with fixation suppression using infrared goggles. 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 She is completely asymptomatic today. Pt advised that she can stop performing the Epley canalith repositioning treatment at home. Encouraged her to resume treatment if her symptoms recur and if she does not achieve resolution of symptoms to follow-up for additional intervention as needed. Pt is being discharged on this date due to complete resolution of her symptoms.                                    PT Short Term Goals - 10/30/18 0950      PT SHORT TERM GOAL #1   Title  Pt will be independent with HEP in order to self-treat vertigo and fully resolve symptoms    Time  3    Period  Weeks    Status  Achieved    Target Date  12/02/18        PT Long Term Goals - 10/30/18 0950      PT LONG TERM GOAL #1   Title  Pt will report no further bouts of vertigo when rolling in bed in order to resume normal sleeping without aggravation of symptoms or disruption in rest.    Baseline  10/21/18: Vertigo when rolling to the left in bed; 10/30/18: No further vertigo since initial evaluation    Time  6    Period  Weeks    Status  Achieved            Plan - 10/30/18 1779    Clinical Impression Statement  Performed Dix-Hallpike and roll testing with patient today which are negative bilaterally. She reports no further vertigo since her initial PT evaluation and treatment. Performed additional oculomotor/vestibular testing which reveals positive L head thrust and 2nd degree R horizontal nystagmus with fixation suppression using infrared goggles. 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 She is completely asymptomatic today. Pt advised that she can  stop performing the Epley canalith repositioning treatment at home. Encouraged her to resume treatment if her symptoms recur and if she does not achieve resolution of symptoms to follow-up for additional intervention as needed. Pt is being discharged on this date due to complete resolution of her symptoms.    Personal Factors and Comorbidities  Age;Comorbidity 1;Comorbidity 2;Time since onset of injury/illness/exacerbation    Comorbidities  Prior history of vestibular neuritis, osteoporosis    Examination-Activity Limitations  Other   Rolling   Examination-Participation Restrictions  Other   Sleeping   Stability/Clinical Decision Making  Stable/Uncomplicated  Rehab Potential  Excellent    PT Frequency  1x / week    PT Duration  6 weeks    PT Treatment/Interventions  Other (comment);Therapeutic exercise;Therapeutic activities;Manual techniques;Cryotherapy;Electrical Stimulation;Moist Heat;Iontophoresis 4mg /ml Dexamethasone;Ultrasound;ADLs/Self Care Home Management;Aquatic Therapy;Canalith Repostioning;Traction;Functional mobility training;Gait training;Balance training;Neuromuscular re-education;Patient/family education;Dry needling;Vestibular   iontophoresis with dexamethasone   PT Next Visit Plan  Discharge    PT Home Exercise Plan  Home Epley for L posterior canal BPPV as needed    Consulted and Agree with Plan of Care  Patient       Patient will benefit from skilled therapeutic intervention in order to improve the following deficits and impairments:  Dizziness  Visit Diagnosis: 1. BPPV (benign paroxysmal positional vertigo), left   2. Dizziness and giddiness        Problem List Patient Active Problem List   Diagnosis Date Noted  . Osteopenia of multiple sites 08/11/2018  . Near syncope 04/22/2018  . Personal history of other malignant neoplasm of skin 09/24/2017  . Osteoarthritis of carpometacarpal (CMC) joint of thumb 09/05/2017  . Costochondritis 12/26/2015  . Allergic  state 02/09/2015  . Adult hypothyroidism 02/09/2015  . OP (osteoporosis) 02/09/2015  . AV (anaerobic vaginosis) 11/18/2014  . Non-traumatic rotator cuff tear 09/29/2013   Phillips Grout PT, DPT, GCS  Shyane Fossum 10/30/2018, 9:51 AM  [place] MAIN [facility] SERVICES [address] [place], [place], [address] Phone: [phone]   Fax:  [phone]  Name: KALLEY NICHOLL MRN: [id number] Date of Birth: 1950-09-13

## 2018-11-02 ENCOUNTER — Ambulatory Visit: Payer: Self-pay

## 2018-11-03 DIAGNOSIS — J45991 Cough variant asthma: Secondary | ICD-10-CM | POA: Diagnosis not present

## 2018-11-03 DIAGNOSIS — J310 Chronic rhinitis: Secondary | ICD-10-CM | POA: Diagnosis not present

## 2018-11-13 ENCOUNTER — Other Ambulatory Visit: Payer: Self-pay | Admitting: Internal Medicine

## 2018-11-13 DIAGNOSIS — Z1231 Encounter for screening mammogram for malignant neoplasm of breast: Secondary | ICD-10-CM

## 2018-11-17 DIAGNOSIS — M461 Sacroiliitis, not elsewhere classified: Secondary | ICD-10-CM | POA: Diagnosis not present

## 2018-11-20 DIAGNOSIS — Z0000 Encounter for general adult medical examination without abnormal findings: Secondary | ICD-10-CM | POA: Diagnosis not present

## 2018-11-27 DIAGNOSIS — E039 Hypothyroidism, unspecified: Secondary | ICD-10-CM | POA: Diagnosis not present

## 2018-11-27 DIAGNOSIS — Z0000 Encounter for general adult medical examination without abnormal findings: Secondary | ICD-10-CM | POA: Diagnosis not present

## 2018-11-27 DIAGNOSIS — M8589 Other specified disorders of bone density and structure, multiple sites: Secondary | ICD-10-CM | POA: Diagnosis not present

## 2018-11-27 DIAGNOSIS — R079 Chest pain, unspecified: Secondary | ICD-10-CM | POA: Diagnosis not present

## 2018-12-06 ENCOUNTER — Ambulatory Visit
Admission: EM | Admit: 2018-12-06 | Discharge: 2018-12-06 | Disposition: A | Discharge status: Home / Self Care | Payer: 59 | Attending: Physician Assistant | Admitting: Physician Assistant

## 2018-12-06 ENCOUNTER — Encounter: Payer: Self-pay | Admitting: Emergency Medicine

## 2018-12-06 ENCOUNTER — Other Ambulatory Visit: Payer: Self-pay

## 2018-12-06 DIAGNOSIS — B029 Zoster without complications: Secondary | ICD-10-CM | POA: Diagnosis not present

## 2018-12-06 MED ORDER — IBUPROFEN 800 MG PO TABS: 800.0000 mg | ORAL_TABLET | Freq: Three times a day (TID) | ORAL | 0 refills | Status: DC

## 2018-12-06 MED ORDER — VALACYCLOVIR HCL 1 G PO TABS: 1000.0000 mg | ORAL_TABLET | Freq: Three times a day (TID) | ORAL | 0 refills | Status: DC

## 2018-12-06 NOTE — Discharge Instructions (Addendum)
SHINGLES: Begin antiviral medication. Take ibuprofen as needed for pain. May also take Tylenol. Can use cool compresses on area. Do not put anything else on area--may clean gently w/ soap and water, keep dry. F/u with PCP for re-check in 7-10 days. Go to ER sooner if rash spreads to face. May return to [place] for re-evaluation if pain worsens or signs of infection. You are contagious to anyone who has not been vaccinated against chicken pox so avoid children,  babies, pregnant women and immunocompromised persons.

## 2018-12-06 NOTE — ED Provider Notes (Signed)
[facility]    CSN: [id number] Arrival date & time: 12/06/18  0840     History   Chief Complaint Chief Complaint  Patient presents with  . Rash    HPI [name] is a 68 y.o. female. Patient presents for 4 day history of painful vesicular rash of the left thorax. She says the area is very tender to touch and wearing her bra is even painful. She has been applying topical corticosteroid cream to area w/o relief. She admits to pain in the area of the rash before onset of the rash. Rash is blistering, but she denies any drainage from the area. Has been taking OTC NSAIDs/Tylenol w/ minimal improvement in condition. Denies rash in any other areas. Denies fever, fatigue, aches, congestion, cough, sore throat, abdominal pain, n/v/d. Has not had a shingles vaccine and denies ever having shingles in the past, but is fairly certain she may have shingles now. She did have a telemedicine visit with PCP a few days ago and states the thought the rash was contact dermatitis vs early shingles. Has not been taking any antiviral medications. Denies any other concerns today.  HPI  History reviewed. No pertinent past medical history.  Patient Active Problem List   Diagnosis Date Noted  . Osteopenia of multiple sites 08/11/2018  . Near syncope 04/22/2018  . Personal history of other malignant neoplasm of skin 09/24/2017  . Osteoarthritis of carpometacarpal (CMC) joint of thumb 09/05/2017  . Costochondritis 12/26/2015  . Allergic state 02/09/2015  . Adult hypothyroidism 02/09/2015  . OP (osteoporosis) 02/09/2015  . AV (anaerobic vaginosis) 11/18/2014  . Non-traumatic rotator cuff tear 09/29/2013    Past Surgical History:  Procedure Laterality Date  . KNEE SURGERY    . TOTAL VAGINAL HYSTERECTOMY      OB History   No obstetric history on file.      Home Medications    Prior to Admission medications   Medication Sig Start Date End Date Taking? Authorizing Provider  albuterol  (PROVENTIL HFA;VENTOLIN HFA) 108 (90 Base) MCG/ACT inhaler Inhale 2 puffs into the lungs every 4 (four) hours as needed for wheezing or shortness of breath (cough, shortness of breath or wheezing.). 01/19/18  Yes Scot Jun, FNP  calcium carbonate (OS-CAL) 1250 (500 Ca) MG chewable tablet Chew 1 tablet by mouth daily.   Yes [provider]  cetirizine (ZYRTEC) 10 MG chewable tablet Chew 10 mg by mouth daily.   Yes [provider]  cholecalciferol (VITAMIN D) 1000 units tablet Take 1,000 Units by mouth daily.   Yes [provider]  fexofenadine (ALLEGRA) 30 MG tablet Take 30 mg by mouth daily.    Yes [provider]  fluticasone (FLONASE) 50 MCG/ACT nasal spray Place 1 spray into both nostrils daily. 06/03/18  Yes [provider]  Multiple Vitamin (MULTI-VITAMINS) TABS Take by mouth.   Yes [provider]  mupirocin cream (BACTROBAN) 2 % Apply 1 application topically 2 (two) times daily. 09/18/18  Yes [name], PA-C  ibuprofen (ADVIL) 800 MG tablet Take 1 tablet (800 mg total) by mouth 3 (three) times daily. 12/06/18   Danton Clap, PA-C  levocetirizine (XYZAL) 5 MG tablet Take 1 tablet (5 mg total) by mouth every evening for 30 days. 09/18/18 10/18/18  [name], PA-C  montelukast (SINGULAIR) 10 MG tablet Take 1 tablet (10 mg total) by mouth daily for 30 days. 09/18/18 10/18/18  [name], PA-C  valACYclovir (VALTREX) 1000 MG tablet Take 1  tablet (1,000 mg total) by mouth 3 (three) times daily. 12/06/18   Danton Clap, PA-C    Family History Family History  Problem Relation Age of Onset  . Lung cancer Father   . Breast cancer Neg Hx     Social History Social History   Tobacco Use  . Smoking status: Never Smoker  . Smokeless tobacco: Never Used  Substance Use Topics  . Alcohol use: No    Alcohol/week: 0.0 standard drinks  . Drug use: No     Allergies   Iodinated diagnostic agents   Review of Systems  Review of Systems  Constitutional: Negative for fatigue and fever.  HENT: Negative for congestion and sore throat.   Respiratory: Negative for cough and shortness of breath.   Cardiovascular: Negative for chest pain.  Gastrointestinal: Negative for abdominal pain, nausea and vomiting.  Musculoskeletal: Negative for myalgias.  Skin: Positive for rash. Negative for wound.  Neurological: Negative for weakness and headaches.  Hematological: Negative for adenopathy.     Physical Exam Triage Vital Signs ED Triage Vitals  Enc Vitals Group     BP --      Pulse Rate 12/06/18 0852 (!) 105     Resp 12/06/18 0852 18     Temp 12/06/18 0852 98.4 F (36.9 C)     Temp Source 12/06/18 0852 Oral     SpO2 12/06/18 0852 98 %     Weight 12/06/18 0850 132 lb 9.6 oz (60.1 kg)     Height 12/06/18 0850 5\' 3"  (1.6 m)     Head Circumference --      Peak Flow --      Pain Score 12/06/18 0850 7     Pain Loc --      Pain Edu? --      Excl. in [place]? --    No data found.  Updated Vital Signs BP (!) 160/103 (BP Location: Right Arm)   Pulse (!) 105   Temp 98.4 F (36.9 C) (Oral)   Resp 18   Ht 5\' 3"  (1.6 m)   Wt 132 lb 9.6 oz (60.1 kg)   SpO2 98%   BMI 23.49 kg/m       Physical Exam Vitals signs and nursing note reviewed.  Constitutional:      General: She is not in acute distress.    Appearance: She is normal weight.  HENT:     Head: Normocephalic and atraumatic.     Mouth/Throat:     Mouth: Mucous membranes are moist.     Pharynx: Oropharynx is clear. No posterior oropharyngeal erythema.  Eyes:     General: No scleral icterus.    Conjunctiva/sclera: Conjunctivae normal.  Neck:     Musculoskeletal: Normal range of motion and neck supple.  Cardiovascular:     Rate and Rhythm: Regular rhythm. Tachycardia present.     Heart sounds: Normal heart sounds. No murmur.  Pulmonary:     Effort: Pulmonary effort is normal.     Breath sounds: Normal breath sounds.  Lymphadenopathy:      Cervical: No cervical adenopathy.  Skin:    General: Skin is warm and dry.     Findings: Rash present.     Comments: +vesicular rash left thorax laterally extending to back and abdomen w/o crossing midline. Area is very tender to touch. No drainage. No crusting.   Neurological:     General: No focal deficit present.     Mental Status: She is alert. Mental status  is at baseline.  Psychiatric:        Mood and Affect: Mood normal.        Behavior: Behavior normal.        Thought Content: Thought content normal.      UC Treatments / Results  Labs (all labs ordered are listed, but only abnormal results are displayed) Labs Reviewed - No data to display  EKG   Radiology No results found.  Procedures Procedures (including critical care time)  Medications Ordered in UC Medications - No data to display  Initial Impression / Assessment and Plan / UC Course  I have reviewed the triage vital signs and the nursing notes.  Pertinent labs & imaging results that were available during my care of the patient were reviewed by me and considered in my medical decision making (see chart for details).     Final Clinical Impressions(s) / UC Diagnoses   Final diagnoses:  Herpes zoster without complication     Discharge Instructions     SHINGLES: Begin antiviral medication. Take ibuprofen as needed for pain. May also take Tylenol. Can use cool compresses on area. Do not put anything else on area--may clean gently w/ soap and water, keep dry. F/u with PCP for re-check in 7-10 days. Go to ER sooner if rash spreads to face. May return to [place] for re-evaluation if pain worsens or signs of infection. You are contagious to anyone who has not been vaccinated against chicken pox so avoid children,  babies, pregnant women and immunocompromised persons.     ED Prescriptions    Medication Sig Dispense Auth. Provider   valACYclovir (VALTREX) 1000 MG tablet Take 1 tablet (1,000 mg total) by mouth 3  (three) times daily. 21 tablet Laurene Footman B, PA-C   ibuprofen (ADVIL) 800 MG tablet Take 1 tablet (800 mg total) by mouth 3 (three) times daily. 21 tablet Danton Clap, PA-C     Controlled Substance Prescriptions [place] Controlled Substance Registry consulted? Not Applicable   Gretta Cool 12/06/18 [id number]

## 2018-12-06 NOTE — ED Triage Notes (Signed)
Patient c/o rash to left side that wraps around to her back that started on Tuesday. She did an e visit and was told this could be shingles.

## 2018-12-22 ENCOUNTER — Ambulatory Visit
Admission: RE | Admit: 2018-12-22 | Discharge: 2018-12-22 | Disposition: A | Discharge status: Home / Self Care | Payer: 59 | Source: Ambulatory Visit | Attending: Internal Medicine | Admitting: Internal Medicine

## 2018-12-22 ENCOUNTER — Other Ambulatory Visit: Payer: Self-pay

## 2018-12-22 DIAGNOSIS — Z1231 Encounter for screening mammogram for malignant neoplasm of breast: Secondary | ICD-10-CM | POA: Diagnosis not present

## 2018-12-25 DIAGNOSIS — E039 Hypothyroidism, unspecified: Secondary | ICD-10-CM | POA: Diagnosis not present

## 2019-01-01 DIAGNOSIS — E039 Hypothyroidism, unspecified: Secondary | ICD-10-CM | POA: Diagnosis not present

## 2019-01-01 DIAGNOSIS — M8589 Other specified disorders of bone density and structure, multiple sites: Secondary | ICD-10-CM | POA: Diagnosis not present

## 2019-01-01 DIAGNOSIS — B0229 Other postherpetic nervous system involvement: Secondary | ICD-10-CM | POA: Diagnosis not present

## 2019-01-26 DIAGNOSIS — H26492 Other secondary cataract, left eye: Secondary | ICD-10-CM | POA: Diagnosis not present

## 2019-02-16 DIAGNOSIS — C44311 Basal cell carcinoma of skin of nose: Secondary | ICD-10-CM | POA: Diagnosis not present

## 2019-02-23 DIAGNOSIS — J309 Allergic rhinitis, unspecified: Secondary | ICD-10-CM | POA: Diagnosis not present

## 2019-02-23 DIAGNOSIS — J019 Acute sinusitis, unspecified: Secondary | ICD-10-CM | POA: Diagnosis not present

## 2019-03-02 DIAGNOSIS — D225 Melanocytic nevi of trunk: Secondary | ICD-10-CM | POA: Diagnosis not present

## 2019-03-02 DIAGNOSIS — C44712 Basal cell carcinoma of skin of right lower limb, including hip: Secondary | ICD-10-CM | POA: Diagnosis not present

## 2019-03-02 DIAGNOSIS — L820 Inflamed seborrheic keratosis: Secondary | ICD-10-CM | POA: Diagnosis not present

## 2019-03-02 DIAGNOSIS — L578 Other skin changes due to chronic exposure to nonionizing radiation: Secondary | ICD-10-CM | POA: Diagnosis not present

## 2019-03-02 DIAGNOSIS — D2230 Melanocytic nevi of unspecified part of face: Secondary | ICD-10-CM | POA: Diagnosis not present

## 2019-03-02 DIAGNOSIS — L821 Other seborrheic keratosis: Secondary | ICD-10-CM | POA: Diagnosis not present

## 2019-03-02 DIAGNOSIS — L570 Actinic keratosis: Secondary | ICD-10-CM | POA: Diagnosis not present

## 2019-03-02 DIAGNOSIS — D229 Melanocytic nevi, unspecified: Secondary | ICD-10-CM | POA: Diagnosis not present

## 2019-03-02 DIAGNOSIS — Z1283 Encounter for screening for malignant neoplasm of skin: Secondary | ICD-10-CM | POA: Diagnosis not present

## 2019-03-10 DIAGNOSIS — J309 Allergic rhinitis, unspecified: Secondary | ICD-10-CM | POA: Diagnosis not present

## 2019-04-16 DIAGNOSIS — M461 Sacroiliitis, not elsewhere classified: Secondary | ICD-10-CM | POA: Diagnosis not present

## 2019-04-27 DIAGNOSIS — L570 Actinic keratosis: Secondary | ICD-10-CM | POA: Diagnosis not present

## 2019-05-19 DIAGNOSIS — C44311 Basal cell carcinoma of skin of nose: Secondary | ICD-10-CM | POA: Diagnosis not present

## 2019-06-08 DIAGNOSIS — D485 Neoplasm of uncertain behavior of skin: Secondary | ICD-10-CM | POA: Diagnosis not present

## 2019-06-08 DIAGNOSIS — L905 Scar conditions and fibrosis of skin: Secondary | ICD-10-CM | POA: Diagnosis not present

## 2019-06-08 DIAGNOSIS — L859 Epidermal thickening, unspecified: Secondary | ICD-10-CM | POA: Diagnosis not present

## 2019-06-08 DIAGNOSIS — L578 Other skin changes due to chronic exposure to nonionizing radiation: Secondary | ICD-10-CM | POA: Diagnosis not present

## 2019-06-22 DIAGNOSIS — M461 Sacroiliitis, not elsewhere classified: Secondary | ICD-10-CM | POA: Diagnosis not present

## 2019-06-24 ENCOUNTER — Ambulatory Visit (INDEPENDENT_AMBULATORY_CARE_PROVIDER_SITE_OTHER)
Admission: RE | Admit: 2019-06-24 | Discharge: 2019-06-24 | Disposition: A | Discharge status: Home / Self Care | Payer: 59 | Source: Ambulatory Visit

## 2019-06-24 ENCOUNTER — Other Ambulatory Visit: Payer: Self-pay

## 2019-06-24 DIAGNOSIS — H9203 Otalgia, bilateral: Secondary | ICD-10-CM | POA: Diagnosis not present

## 2019-06-24 DIAGNOSIS — R21 Rash and other nonspecific skin eruption: Secondary | ICD-10-CM | POA: Diagnosis not present

## 2019-06-24 DIAGNOSIS — J0100 Acute maxillary sinusitis, unspecified: Secondary | ICD-10-CM | POA: Diagnosis not present

## 2019-06-24 MED ORDER — PREDNISONE 10 MG (21) PO TBPK: ORAL_TABLET | Freq: Every day | ORAL | 0 refills | Status: DC

## 2019-06-24 MED ORDER — ALBUTEROL SULFATE HFA 108 (90 BASE) MCG/ACT IN AERS: 2.0000 | INHALATION_SPRAY | RESPIRATORY_TRACT | 0 refills | Status: DC | PRN

## 2019-06-24 MED ORDER — AMOXICILLIN 875 MG PO TABS: 875.0000 mg | ORAL_TABLET | Freq: Two times a day (BID) | ORAL | 0 refills | Status: AC

## 2019-06-24 NOTE — ED Provider Notes (Signed)
Virtual Visit via Video Note:  [name]  initiated request for Telemedicine visit with [facility] team. I connected with [name]  on 06/24/2019 at 10:13 AM  for a synchronized telemedicine visit using a video enabled HIPPA compliant telemedicine application. I verified that I am speaking with [name]  using two identifiers. Sharion Balloon, NP  was physically located in a [facility] Urgent care site and [name] was located at a different location.   The limitations of evaluation and management by telemedicine as well as the availability of in-person appointments were discussed. Patient was informed that she  may incur a bill ( including co-pay) for this virtual visit encounter. [name]  expressed understanding and gave verbal consent to proceed with virtual visit.     History of Present Illness:[name]  is a 69 y.o. female presents for evaluation of erythematous rash on her facial cheeks since waking up this morning.  The rash feels warm but does not itch or hurt; She has had a previous similar rash which was diagnosed as an infection and treated with amoxicillin successfully.  She denies new products, soaps, medications.  She also have bilateral ear pain, maxillary sinus pressure, and feeling that her ears are stopped up.  Temp 99.9.  She denies chills, sore throat, cough, shortness of breath, vomiting, diarrhea, or other symptoms.  Attempted treatment with Flonase, nettie pot, singular, and Zyrtec.     Allergies  Allergen Reactions  . Iodinated Diagnostic Agents Hives     History reviewed. No pertinent past medical history.   Social History   Tobacco Use  . Smoking status: Never Smoker  . Smokeless tobacco: Never Used  Substance Use Topics  . Alcohol use: No    Alcohol/week: 0.0 standard drinks  . Drug use: No    ROS: as stated in HPI.  All other systems reviewed and negative.       Observations/Objective: Physical Exam  VITALS:  Patient denies fever. GENERAL: Alert, appears well and in no acute distress. HEENT: Atraumatic. NECK: Normal movements of the head and neck. CARDIOPULMONARY: No increased WOB. Speaking in clear sentences. I:E ratio WNL.  MS: Moves all visible extremities without noticeable abnormality. PSYCH: Pleasant and cooperative, well-groomed. Speech normal rate and rhythm. Affect is appropriate. Insight and judgement are appropriate. Attention is focused, linear, and appropriate.  NEURO: CN grossly intact. Oriented as arrived to appointment on time with no prompting. Moves both UE equally.  SKIN: No obvious lesions, wounds, erythema, or cyanosis noted on face or hands.   Assessment and Plan:    ICD-10-CM   1. Rash and nonspecific skin eruption  R21   2. Otalgia of both ears  H92.03   3. Acute non-recurrent maxillary sinusitis  J01.00        Follow Up Instructions: Treating with amoxicillin, prednisone, and a refill on her albuterol inhaler.  Instructed her to follow-up with her PCP if her symptoms are not improving; and to go to the ED if she has acute worsening symptoms or new symptoms such as difficulty swallowing or breathing.  Patient agrees to plan of care.      I discussed the assessment and treatment plan with the patient. The patient was provided an opportunity to ask questions and all were answered. The patient agreed with the plan and demonstrated an understanding of the instructions.   The patient was advised to call back or seek an in-person evaluation if  the symptoms worsen or if the condition fails to improve as anticipated.      Sharion Balloon, NP  06/24/2019 10:13 AM         Sharion Balloon, NP 06/24/19 1015

## 2019-06-24 NOTE — Discharge Instructions (Addendum)
Take the amoxicillin, prednisone, and albuterol inhaler as directed.    Follow-up with your primary care provider if your symptoms are not improving.    Go to the emergency department if you have acute worsening symptoms; or develop new symptoms such as difficulty swallowing, difficulty breathing, or other concerns.

## 2019-07-01 DIAGNOSIS — Z124 Encounter for screening for malignant neoplasm of cervix: Secondary | ICD-10-CM | POA: Diagnosis not present

## 2019-07-01 DIAGNOSIS — Z01419 Encounter for gynecological examination (general) (routine) without abnormal findings: Secondary | ICD-10-CM | POA: Diagnosis not present

## 2019-07-01 DIAGNOSIS — Z1211 Encounter for screening for malignant neoplasm of colon: Secondary | ICD-10-CM | POA: Diagnosis not present

## 2019-07-01 DIAGNOSIS — Z1231 Encounter for screening mammogram for malignant neoplasm of breast: Secondary | ICD-10-CM | POA: Diagnosis not present

## 2019-07-01 DIAGNOSIS — R300 Dysuria: Secondary | ICD-10-CM | POA: Diagnosis not present

## 2019-07-02 DIAGNOSIS — E039 Hypothyroidism, unspecified: Secondary | ICD-10-CM | POA: Diagnosis not present

## 2019-07-02 DIAGNOSIS — R300 Dysuria: Secondary | ICD-10-CM | POA: Diagnosis not present

## 2019-07-23 DIAGNOSIS — Z780 Asymptomatic menopausal state: Secondary | ICD-10-CM | POA: Diagnosis not present

## 2019-07-23 DIAGNOSIS — Z0000 Encounter for general adult medical examination without abnormal findings: Secondary | ICD-10-CM | POA: Diagnosis not present

## 2019-07-23 DIAGNOSIS — E039 Hypothyroidism, unspecified: Secondary | ICD-10-CM | POA: Diagnosis not present

## 2019-07-23 DIAGNOSIS — M8589 Other specified disorders of bone density and structure, multiple sites: Secondary | ICD-10-CM | POA: Diagnosis not present

## 2019-07-27 ENCOUNTER — Other Ambulatory Visit: Payer: Self-pay | Admitting: Internal Medicine

## 2019-07-27 DIAGNOSIS — Z780 Asymptomatic menopausal state: Secondary | ICD-10-CM

## 2019-07-27 DIAGNOSIS — M8589 Other specified disorders of bone density and structure, multiple sites: Secondary | ICD-10-CM

## 2019-07-27 DIAGNOSIS — Z1231 Encounter for screening mammogram for malignant neoplasm of breast: Secondary | ICD-10-CM

## 2019-08-18 ENCOUNTER — Ambulatory Visit: Payer: Self-pay | Admitting: Dermatology

## 2019-08-19 ENCOUNTER — Other Ambulatory Visit: Payer: 59

## 2019-08-27 DIAGNOSIS — M13841 Other specified arthritis, right hand: Secondary | ICD-10-CM | POA: Diagnosis not present

## 2019-08-28 ENCOUNTER — Ambulatory Visit (INDEPENDENT_AMBULATORY_CARE_PROVIDER_SITE_OTHER)
Admission: RE | Admit: 2019-08-28 | Discharge: 2019-08-28 | Disposition: A | Discharge status: Home / Self Care | Payer: 59 | Source: Ambulatory Visit

## 2019-08-28 DIAGNOSIS — J3089 Other allergic rhinitis: Secondary | ICD-10-CM | POA: Diagnosis not present

## 2019-08-28 DIAGNOSIS — R0981 Nasal congestion: Secondary | ICD-10-CM

## 2019-08-28 DIAGNOSIS — R21 Rash and other nonspecific skin eruption: Secondary | ICD-10-CM | POA: Diagnosis not present

## 2019-08-28 MED ORDER — LEVOCETIRIZINE DIHYDROCHLORIDE 5 MG PO TABS: 5.0000 mg | ORAL_TABLET | Freq: Every evening | ORAL | 0 refills | Status: DC

## 2019-08-28 MED ORDER — PREDNISONE 20 MG PO TABS: ORAL_TABLET | ORAL | 0 refills | Status: DC

## 2019-08-28 NOTE — ED Provider Notes (Signed)
Virtual Visit via Video Note:  [name]  initiated request for Telemedicine visit with [facility] Urgent [facility] team. I connected with Jleigh Disque Lubben  on 08/28/2019 at 10:34 AM  for a synchronized telemedicine visit using a video enabled HIPPA compliant telemedicine application. I verified that I am speaking with Anab J Meunier  using two identifiers. Jaynee Eagles, PA-C  was physically located in a [facility] Urgent care site and AYLIANA BRUNKEN was located at a different location.   The limitations of evaluation and management by telemedicine as well as the availability of in-person appointments were discussed. Patient was informed that she  may incur a bill ( including co-pay) for this virtual visit encounter. Jearldean J Dao  expressed understanding and gave verbal consent to proceed with virtual visit.     History of Present Illness:[name]  is a 69 y.o. female presents with 1 day history of sinus congestion, now having a red itchy rash over her cheeks.  Had a previous episode in February like this and underwent a course of amoxicillin and prednisone with quick resolution of her symptoms.  Has a history of significant allergies and consistently uses Allegra or Claritin, Nettie pot and Singulair.  Denies fever, sinus pain, cough, shortness of breath, chest pain.  She does work with an ENT specialist and has followed up with them since her last office visit.  ROS   Current Outpatient Medications  Medication Sig Dispense Refill  . albuterol (PROVENTIL HFA;VENTOLIN HFA) 108 (90 Base) MCG/ACT inhaler Inhale 2 puffs into the lungs every 4 (four) hours as needed for wheezing or shortness of breath (cough, shortness of breath or wheezing.). 1 Inhaler 1  . albuterol (VENTOLIN HFA) 108 (90 Base) MCG/ACT inhaler Inhale 2 puffs into the lungs every 4 (four) hours as needed for wheezing or shortness of breath. 18 g 0  . calcium carbonate (OS-CAL) 1250 (500 Ca) MG chewable tablet Chew 1 tablet by  mouth daily.    . cetirizine (ZYRTEC) 10 MG chewable tablet Chew 10 mg by mouth daily.    . cholecalciferol (VITAMIN D) 1000 units tablet Take 1,000 Units by mouth daily.    . fexofenadine (ALLEGRA) 30 MG tablet Take 30 mg by mouth daily.     . fluticasone (FLONASE) 50 MCG/ACT nasal spray Place 1 spray into both nostrils daily.    Marland Kitchen ibuprofen (ADVIL) 800 MG tablet Take 1 tablet (800 mg total) by mouth 3 (three) times daily. 21 tablet 0  . levocetirizine (XYZAL) 5 MG tablet Take 1 tablet (5 mg total) by mouth every evening for 30 days. 30 tablet 0  . montelukast (SINGULAIR) 10 MG tablet Take 1 tablet (10 mg total) by mouth daily for 30 days. 30 tablet 0  . Multiple Vitamin (MULTI-VITAMINS) TABS Take by mouth.    . mupirocin cream (BACTROBAN) 2 % Apply 1 application topically 2 (two) times daily. 15 g 0  . predniSONE (STERAPRED UNI-PAK 21 TAB) 10 MG (21) TBPK tablet Take by mouth daily. Take 6 tabs by mouth daily  for 1 day, then 5 tabs for 1 day, then 4 tabs for 1 day, then 3 tabs for 1 day, 2 tabs for 1 day, then 1 tab by mouth daily for 1 day 21 tablet 0  . valACYclovir (VALTREX) 1000 MG tablet Take 1 tablet (1,000 mg total) by mouth 3 (three) times daily. 21 tablet 0   No current facility-administered medications for this encounter.     Allergies  Allergen Reactions  . Iodinated Diagnostic Agents Hives     No past medical history on file.  Past Surgical History:  Procedure Laterality Date  . KNEE SURGERY    . TOTAL VAGINAL HYSTERECTOMY       Observations/Objective: Physical Exam Constitutional:      General: She is not in acute distress.    Appearance: Normal appearance. She is well-developed. She is not ill-appearing, toxic-appearing or diaphoretic.  HENT:     Head:   Eyes:     Extraocular Movements: Extraocular movements intact.  Pulmonary:     Effort: Pulmonary effort is normal.  Neurological:     General: No focal deficit present.     Mental Status: She is alert and  oriented to person, place, and time.  Psychiatric:        Mood and Affect: Mood normal.        Behavior: Behavior normal.        Thought Content: Thought content normal.        Judgment: Judgment normal.     Assessment and Plan:  PDMP not reviewed this encounter.  1. Facial rash   2. Seasonal allergic rhinitis due to other allergic trigger   3. Sinus congestion    Given her recent use of amoxicillin, will use prednisone course instead to address an allergic rhinitis, possible atopic dermatitis of the face.  Recommended she maintain her allergy regimen, add Xyzal in the evening.  If no improvement in 3 or 4 days, recheck and consider antibiotic course at that point.  Denies history of lupus, consider rosacea if no improvement.  Counseled patient on potential for adverse effects with medications prescribed/recommended today, ER and return-to-clinic precautions discussed, patient verbalized understanding.    Follow Up Instructions:    I discussed the assessment and treatment plan with the patient. The patient was provided an opportunity to ask questions and all were answered. The patient agreed with the plan and demonstrated an understanding of the instructions.   The patient was advised to call back or seek an in-person evaluation if the symptoms worsen or if the condition fails to improve as anticipated.  I provided 5 minutes of non-face-to-face time during this encounter.    Jaynee Eagles, PA-C  08/28/2019 10:34 AM         Jaynee Eagles, PA-C 08/28/19 1042

## 2019-08-31 ENCOUNTER — Ambulatory Visit (INDEPENDENT_AMBULATORY_CARE_PROVIDER_SITE_OTHER)
Admission: RE | Admit: 2019-08-31 | Discharge: 2019-08-31 | Disposition: A | Discharge status: Home / Self Care | Payer: 59 | Source: Ambulatory Visit

## 2019-08-31 ENCOUNTER — Other Ambulatory Visit: Payer: Self-pay

## 2019-08-31 DIAGNOSIS — J0100 Acute maxillary sinusitis, unspecified: Secondary | ICD-10-CM

## 2019-08-31 MED ORDER — LEVOCETIRIZINE DIHYDROCHLORIDE 5 MG PO TABS: 5.0000 mg | ORAL_TABLET | Freq: Every evening | ORAL | 0 refills | Status: DC

## 2019-08-31 MED ORDER — AMOXICILLIN 875 MG PO TABS: 875.0000 mg | ORAL_TABLET | Freq: Two times a day (BID) | ORAL | 0 refills | Status: AC

## 2019-08-31 NOTE — ED Provider Notes (Signed)
Virtual Visit via Video Note:  [name]  initiated request for Telemedicine visit with [facility] Urgent Care team. I connected with [name]  on 08/31/2019 at 11:54 AM  for a synchronized telemedicine visit using a video enabled HIPPA compliant telemedicine application. I verified that I am speaking with [name]  using two identifiers. Sharion Balloon, NP  was physically located in a [facility] Urgent care site and RANIM HERTLING was located at a different location.   The limitations of evaluation and management by telemedicine as well as the availability of in-person appointments were discussed. Patient was informed that she  may incur a bill ( including co-pay) for this virtual visit encounter. Janaia J Prince  expressed understanding and gave verbal consent to proceed with virtual visit.     History of Present Illness:[name]  is a 69 y.o. female presents for evaluation of sinus pressure and maxillary pain x 1 week.  Her voice is hoarse.  She has a pruritic red rash on her facial cheeks which she has had in the past.  No fever or chills.  She had a video visit on 08/28/2019 and was treated with prednisone; she was also prescribed Xyzal but the pharmacy did not have it so she has not been able to take this.  She states her symptoms have not improved on the prednisone.  Patient has history of sinusitis.     Allergies  Allergen Reactions  . Iodinated Diagnostic Agents Hives     History reviewed. No pertinent past medical history.   Social History   Tobacco Use  . Smoking status: Never Smoker  . Smokeless tobacco: Never Used  Substance Use Topics  . Alcohol use: No    Alcohol/week: 0.0 standard drinks  . Drug use: No   ROS: as stated in HPI.  All other systems reviewed and negative.      Observations/Objective: Physical Exam  VITALS: Patient denies fever. GENERAL: Alert, appears well and in no acute distress. HEENT: Atraumatic.  Voice hoarse.   NECK: Normal  movements of the head and neck. CARDIOPULMONARY: No increased WOB. Speaking in clear sentences. I:E ratio WNL.  MS: Moves all visible extremities without noticeable abnormality. PSYCH: Pleasant and cooperative, well-groomed. Speech normal rate and rhythm. Affect is appropriate. Insight and judgement are appropriate. Attention is focused, linear, and appropriate.  NEURO: CN grossly intact. Oriented as arrived to appointment on time with no prompting. Moves both UE equally.  SKIN: Mild erythema across both facial cheeks.     Assessment and Plan:    ICD-10-CM   1. Acute non-recurrent maxillary sinusitis  J01.00        Follow Up Instructions: Treating with amoxicillin as symptoms are not improving on prednisone only. Instructed her to continue the prednisone as prescribed.  Also re-prescribed the [place] patient to follow-up with her PCP or come here to be seen in person if her symptoms or not improving. Patient agrees to plan of care.    I discussed the assessment and treatment plan with the patient. The patient was provided an opportunity to ask questions and all were answered. The patient agreed with the plan and demonstrated an understanding of the instructions.   The patient was advised to call back or seek an in-person evaluation if the symptoms worsen or if the condition fails to improve as anticipated.      Sharion Balloon, NP  08/31/2019 11:54 AM  Sharion Balloon, NP 08/31/19 1154

## 2019-08-31 NOTE — Discharge Instructions (Signed)
Take the amoxicillin and Xyzal as directed.  Continue the prednisone as prescribed.    Follow up with your primary care provider or come here to be seen in person if your symptoms are not improving.

## 2019-09-01 DIAGNOSIS — C44311 Basal cell carcinoma of skin of nose: Secondary | ICD-10-CM | POA: Diagnosis not present

## 2019-09-07 ENCOUNTER — Ambulatory Visit: Payer: 59 | Admitting: Dermatology

## 2019-09-07 ENCOUNTER — Other Ambulatory Visit: Payer: Self-pay

## 2019-09-07 DIAGNOSIS — L820 Inflamed seborrheic keratosis: Secondary | ICD-10-CM | POA: Diagnosis not present

## 2019-09-07 DIAGNOSIS — L905 Scar conditions and fibrosis of skin: Secondary | ICD-10-CM | POA: Diagnosis not present

## 2019-09-07 DIAGNOSIS — L988 Other specified disorders of the skin and subcutaneous tissue: Secondary | ICD-10-CM

## 2019-09-07 NOTE — Progress Notes (Signed)
   Follow-Up Visit   Subjective  [name] is a 69 y.o. female who presents for the following: Facial Elastosis (Fillers today around chin) and Other (Spots of left post neck, left lat canthus, left forearm, left nose - just wants to make sure she doesn't have any other skin cancers).    The following portions of the chart were reviewed this encounter and updated as appropriate:  Tobacco  Allergies  Meds  Problems  Med Hx  Surg Hx  Fam Hx      Review of Systems:  No other skin or systemic complaints except as noted in HPI or Assessment and Plan.  Objective  Well appearing patient in no apparent distress; mood and affect are within normal limits.  A focused examination was performed including face, neck, left forearm. Relevant physical exam findings are noted in the Assessment and Plan.  Objective  Head - Anterior (Face): Rhytides and volume loss.   Images            Objective  Left Forearm x 1, left post neck x 1 (2): Erythematous keratotic or waxy stuck-on papule or plaque.   Objective  Left lat canthus: Dyspigmented smooth macule or patch.   Images     Assessment & Plan  Elastosis of skin Head - Anterior (Face)  Filling material injection - Head - Anterior (Face) Prior to the procedure, the patient's past medical history, allergies and the rare but potential risks and complications were reviewed with the patient and a signed consent was obtained. Pre and post-treatment care was discussed and instructions provided.  Location: chin  Filler Type: Restylane Defyne Lot [id number] 01/03/2021  Procedure: The area was prepped thoroughly with Puracyn. After introducing the needle into the desired treatment area, the syringe plunger was drawn back to ensure there was no flash of blood prior to injecting the filler in order to minimize risk of intravascular injection and vascular occlusion. After injection of the filler, the treated areas were cleansed and  iced to reduce swelling. Post-treatment instructions were reviewed with the patient.       Patient tolerated the procedure well. The patient will call with any problems, questions or concerns prior to their next appointment.   Inflamed seborrheic keratosis (2) Left Forearm x 1, left post neck x 1  Left post neck - Inflamed SK vs LSC (Biopsy proven Epidermal Hyperplasia 06/08/2019)  Left forearm - Inflamed SK  Destruction of lesion - Left Forearm x 1, left post neck x 1 Complexity: simple   Destruction method: cryotherapy   Informed consent: discussed and consent obtained   Timeout:  patient name, date of birth, surgical site, and procedure verified Lesion destroyed using liquid nitrogen: Yes   Region frozen until ice ball extended beyond lesion: Yes   Outcome: patient tolerated procedure well with no complications   Post-procedure details: wound care instructions given    Scar Left lat canthus  Recheck on follow up.  Return in about 6 months (around 03/09/2020) for TBSE.   I, Ashok Cordia, CMA, am acting as scribe for Sarina Ser, MD    Documentation: I have reviewed the above documentation for accuracy and completeness, and I agree with the above.  Sarina Ser, MD

## 2019-09-07 NOTE — Patient Instructions (Signed)

## 2019-09-08 ENCOUNTER — Encounter: Payer: Self-pay | Admitting: Dermatology

## 2019-09-13 ENCOUNTER — Other Ambulatory Visit: Payer: 59

## 2019-09-14 DIAGNOSIS — J019 Acute sinusitis, unspecified: Secondary | ICD-10-CM | POA: Diagnosis not present

## 2019-09-14 DIAGNOSIS — J309 Allergic rhinitis, unspecified: Secondary | ICD-10-CM | POA: Diagnosis not present

## 2019-10-18 ENCOUNTER — Ambulatory Visit
Admission: RE | Admit: 2019-10-18 | Discharge: 2019-10-18 | Disposition: A | Discharge status: Home / Self Care | Payer: 59 | Source: Ambulatory Visit | Attending: Internal Medicine | Admitting: Internal Medicine

## 2019-10-18 DIAGNOSIS — Z13820 Encounter for screening for osteoporosis: Secondary | ICD-10-CM | POA: Insufficient documentation

## 2019-10-18 DIAGNOSIS — Z780 Asymptomatic menopausal state: Secondary | ICD-10-CM | POA: Insufficient documentation

## 2019-10-18 DIAGNOSIS — M8589 Other specified disorders of bone density and structure, multiple sites: Secondary | ICD-10-CM | POA: Diagnosis not present

## 2019-10-18 DIAGNOSIS — M85852 Other specified disorders of bone density and structure, left thigh: Secondary | ICD-10-CM | POA: Diagnosis not present

## 2019-11-14 DIAGNOSIS — R300 Dysuria: Secondary | ICD-10-CM | POA: Diagnosis not present

## 2019-11-14 DIAGNOSIS — R350 Frequency of micturition: Secondary | ICD-10-CM | POA: Diagnosis not present

## 2019-11-14 DIAGNOSIS — J0140 Acute pansinusitis, unspecified: Secondary | ICD-10-CM | POA: Diagnosis not present

## 2019-11-14 DIAGNOSIS — R030 Elevated blood-pressure reading, without diagnosis of hypertension: Secondary | ICD-10-CM | POA: Diagnosis not present

## 2019-12-07 ENCOUNTER — Other Ambulatory Visit: Payer: Self-pay | Admitting: Internal Medicine

## 2019-12-07 ENCOUNTER — Ambulatory Visit: Payer: 59 | Admitting: Dermatology

## 2019-12-09 ENCOUNTER — Other Ambulatory Visit: Payer: Self-pay | Admitting: Internal Medicine

## 2019-12-16 ENCOUNTER — Other Ambulatory Visit: Payer: Self-pay

## 2019-12-16 ENCOUNTER — Ambulatory Visit
Admission: RE | Admit: 2019-12-16 | Discharge: 2019-12-16 | Disposition: A | Discharge status: Home / Self Care | Payer: 59 | Source: Ambulatory Visit | Attending: Emergency Medicine | Admitting: Emergency Medicine

## 2019-12-16 VITALS — BP 135/84 | HR 92 | Temp 98.7°F | Resp 18 | Wt 138.0 lb

## 2019-12-16 DIAGNOSIS — J0101 Acute recurrent maxillary sinusitis: Secondary | ICD-10-CM

## 2019-12-16 MED ORDER — AMOXICILLIN-POT CLAVULANATE 875-125 MG PO TABS: 1.0000 | ORAL_TABLET | Freq: Two times a day (BID) | ORAL | 0 refills | Status: DC

## 2019-12-16 NOTE — ED Triage Notes (Signed)
Patient in [place] today c/o sinus pressure, ear pain, congestion.  Patient states that after taking the shingles, and pnuemonocol injection a week ago she states that has not been feeling well.  FKC:LEXN

## 2019-12-16 NOTE — ED Provider Notes (Signed)
[name]    CSN: [id number] Arrival date & time: 12/16/19  1344      History   Chief Complaint Chief Complaint  Patient presents with  . Facial Pain  . Sore Throat    HPI [name] is a 69 y.o. female.   Patient presents with 1 week history of maxillary sinus pressure, left ear pain, nasal congestion.  She denies fever, chills, sore throat, shortness of breath, vomiting, diarrhea, or other symptoms.  No treatments attempted at home.  [facility]; diagnosed with sinusitis and was treated symptomatically.    The history is provided by the patient.    History reviewed. No pertinent past medical history.  Patient Active Problem List   Diagnosis Date Noted  . Osteopenia of multiple sites 08/11/2018  . Near syncope 04/22/2018  . Personal history of other malignant neoplasm of skin 09/24/2017  . Osteoarthritis of carpometacarpal (CMC) joint of thumb 09/05/2017  . Costochondritis 12/26/2015  . Allergic state 02/09/2015  . Adult hypothyroidism 02/09/2015  . OP (osteoporosis) 02/09/2015  . AV (anaerobic vaginosis) 11/18/2014  . Non-traumatic rotator cuff tear 09/29/2013    Past Surgical History:  Procedure Laterality Date  . KNEE SURGERY    . TOTAL VAGINAL HYSTERECTOMY      OB History   No obstetric history on file.      Home Medications    Prior to Admission medications   Medication Sig Start Date End Date Taking? Authorizing Provider  albuterol (PROVENTIL HFA;VENTOLIN HFA) 108 (90 Base) MCG/ACT inhaler Inhale 2 puffs into the lungs every 4 (four) hours as needed for wheezing or shortness of breath (cough, shortness of breath or wheezing.). 01/19/18   Scot Jun, FNP  albuterol (VENTOLIN HFA) 108 (90 Base) MCG/ACT inhaler Inhale 2 puffs into the lungs every 4 (four) hours as needed for wheezing or shortness of breath. 06/24/19   Sharion Balloon, NP  amoxicillin-clavulanate (AUGMENTIN) 875-125 MG tablet Take 1 tablet  by mouth every 12 (twelve) hours. 12/16/19   Sharion Balloon, NP  calcium carbonate (OS-CAL) 1250 (500 Ca) MG chewable tablet Chew 1 tablet by mouth daily.    [provider]  cetirizine (ZYRTEC) 10 MG chewable tablet Chew 10 mg by mouth daily.    [provider]  cholecalciferol (VITAMIN D) 1000 units tablet Take 1,000 Units by mouth daily.    [provider]  fexofenadine (ALLEGRA) 30 MG tablet Take 30 mg by mouth daily.     [provider]  fluticasone (FLONASE) 50 MCG/ACT nasal spray Place 1 spray into both nostrils daily. 06/03/18   [provider]  ibuprofen (ADVIL) 800 MG tablet Take 1 tablet (800 mg total) by mouth 3 (three) times daily. 12/06/18   Danton Clap, PA-C  levocetirizine (XYZAL) 5 MG tablet Take 1 tablet (5 mg total) by mouth every evening. 08/31/19   Sharion Balloon, NP  montelukast (SINGULAIR) 10 MG tablet Take 1 tablet (10 mg total) by mouth daily for 30 days. 09/18/18 10/18/18  [name], PA-C  Multiple Vitamin (MULTI-VITAMINS) TABS Take by mouth.    [provider]  mupirocin cream (BACTROBAN) 2 % Apply 1 application topically 2 (two) times daily. 09/18/18   [name], PA-C  predniSONE (DELTASONE) 20 MG tablet Take 2 tablets daily with breakfast. 08/28/19   Jaynee Eagles, PA-C  valACYclovir (VALTREX) 1000 MG tablet Take 1 tablet (1,000 mg total) by mouth 3 (three) times daily. 12/06/18  Danton Clap, PA-C    Family History Family History  Problem Relation Age of Onset  . Lung cancer Father   . Breast cancer Neg Hx     Social History Social History   Tobacco Use  . Smoking status: Never Smoker  . Smokeless tobacco: Never Used  Vaping Use  . Vaping Use: Never used  Substance Use Topics  . Alcohol use: No    Alcohol/week: 0.0 standard drinks  . Drug use: No     Allergies   Iodinated diagnostic agents   Review of Systems Review of Systems  Constitutional: Negative for chills and fever.  HENT:  Positive for congestion, ear pain, postnasal drip, rhinorrhea and sinus pressure. Negative for sore throat.   Eyes: Negative for pain and visual disturbance.  Respiratory: Negative for cough and shortness of breath.   Cardiovascular: Negative for chest pain and palpitations.  Gastrointestinal: Negative for abdominal pain, diarrhea and vomiting.  Genitourinary: Negative for dysuria and hematuria.  Musculoskeletal: Negative for arthralgias and back pain.  Skin: Negative for color change and rash.  Neurological: Negative for seizures and syncope.  All other systems reviewed and are negative.    Physical Exam Triage Vital Signs ED Triage Vitals  Enc Vitals Group     BP      Pulse      Resp      Temp      Temp src      SpO2      Weight      Height      Head Circumference      Peak Flow      Pain Score      Pain Loc      Pain Edu?      Excl. in [place]?    No data found.  Updated Vital Signs BP 135/84 (BP Location: Left Arm)   Pulse 92   Temp 98.7 F (37.1 C)   Resp 18   Wt 138 lb (62.6 kg)   SpO2 95%   BMI 24.45 kg/m   Visual Acuity Right Eye Distance:   Left Eye Distance:   Bilateral Distance:    Right Eye Near:   Left Eye Near:    Bilateral Near:     Physical Exam Vitals and nursing note reviewed.  Constitutional:      General: She is not in acute distress.    Appearance: She is well-developed. She is not ill-appearing.  HENT:     Head: Normocephalic and atraumatic.     Right Ear: Tympanic membrane normal.     Left Ear: Tympanic membrane normal.     Nose: Congestion present.     Mouth/Throat:     Mouth: Mucous membranes are moist.     Pharynx: Oropharynx is clear.  Eyes:     Conjunctiva/sclera: Conjunctivae normal.  Cardiovascular:     Rate and Rhythm: Normal rate and regular rhythm.     Heart sounds: No murmur heard.   Pulmonary:     Effort: Pulmonary effort is normal. No respiratory distress.     Breath sounds: Normal breath sounds. No wheezing or  rhonchi.  Abdominal:     Palpations: Abdomen is soft.     Tenderness: There is no abdominal tenderness. There is no guarding or rebound.  Musculoskeletal:     Cervical back: Neck supple.  Skin:    General: Skin is warm and dry.     Findings: No rash.  Neurological:     General:  No focal deficit present.     Mental Status: She is alert and oriented to person, place, and time.     Gait: Gait normal.  Psychiatric:        Mood and Affect: Mood normal.        Behavior: Behavior normal.      UC Treatments / Results  Labs (all labs ordered are listed, but only abnormal results are displayed) Labs Reviewed - No data to display  EKG   Radiology No results found.  Procedures Procedures (including critical care time)  Medications Ordered in UC Medications - No data to display  Initial Impression / Assessment and Plan / UC Course  I have reviewed the triage vital signs and the nursing notes.  Pertinent labs & imaging results that were available during my care of the patient were reviewed by me and considered in my medical decision making (see chart for details).   Acute recurrent maxillary sinusitis.  Treating with Augmentin.  Education provided on sinusitis.  Instructed patient to follow-up with her PCP or an ENT if her symptoms or not improving.  Patient agrees to plan of care.   Final Clinical Impressions(s) / UC Diagnoses   Final diagnoses:  Acute recurrent maxillary sinusitis     Discharge Instructions     Take the antibiotic as directed.    Follow up with your primary care provider if your symptoms are not improving.       ED Prescriptions    Medication Sig Dispense Auth. Provider   amoxicillin-clavulanate (AUGMENTIN) 875-125 MG tablet Take 1 tablet by mouth every 12 (twelve) hours. 14 tablet Sharion Balloon, NP     PDMP not reviewed this encounter.   Sharion Balloon, NP 12/16/19 [phone]

## 2019-12-16 NOTE — Discharge Instructions (Signed)
Take the antibiotic as directed.  Follow up with your primary care provider if your symptoms are not improving.     

## 2019-12-20 ENCOUNTER — Telehealth (HOSPITAL_COMMUNITY): Payer: Self-pay | Admitting: Emergency Medicine

## 2019-12-20 MED ORDER — FLUCONAZOLE 150 MG PO TABS: 150.0000 mg | ORAL_TABLET | Freq: Once | ORAL | 0 refills | Status: AC

## 2019-12-20 NOTE — Telephone Encounter (Signed)
Patient called stating she feels she now has a yeast infection from taking her antibiotics.  Per Claiborne Billings, APP, okay for Diflucan 150mg  x 1.

## 2019-12-28 ENCOUNTER — Other Ambulatory Visit: Payer: Self-pay

## 2019-12-28 ENCOUNTER — Ambulatory Visit (INDEPENDENT_AMBULATORY_CARE_PROVIDER_SITE_OTHER): Payer: 59 | Admitting: Dermatology

## 2019-12-28 DIAGNOSIS — D485 Neoplasm of uncertain behavior of skin: Secondary | ICD-10-CM | POA: Diagnosis not present

## 2019-12-28 DIAGNOSIS — L988 Other specified disorders of the skin and subcutaneous tissue: Secondary | ICD-10-CM

## 2019-12-28 DIAGNOSIS — L820 Inflamed seborrheic keratosis: Secondary | ICD-10-CM | POA: Diagnosis not present

## 2019-12-28 DIAGNOSIS — L280 Lichen simplex chronicus: Secondary | ICD-10-CM

## 2019-12-28 NOTE — Progress Notes (Signed)
   Follow-Up Visit   Subjective  [name] is a 69 y.o. female who presents for the following: irregular lesion (has been there for about 2 months now - she has been picking at it), Facial Elastosis (patient would like to add more fillers to the nasolabial creases and oral commisures), and lesions (on the R upper arm - irregular ).  The following portions of the chart were reviewed this encounter and updated as appropriate:  Tobacco  Allergies  Meds  Problems  Med Hx  Surg Hx  Fam Hx     Review of Systems:  No other skin or systemic complaints except as noted in HPI or Assessment and Plan.  Objective  Well appearing patient in no apparent distress; mood and affect are within normal limits.  A focused examination was performed including the face . Relevant physical exam findings are noted in the Assessment and Plan.  Objective  Face: Rhytides and volume loss.   Images                          Objective  Left Forearm - Anterior: Crust   Objective  R buttocks x 1, R upper arm x 3 (4): Erythematous keratotic or waxy stuck-on papule or plaque.    Assessment & Plan  Elastosis of skin Face  Voluma XC injected into: - B/L nasolabial creases - B/L oral commisures   Lot # [id number] Expiration date: 12/05/2020  Filling material injection - Face Prior to the procedure, the patient's past medical history, allergies and the rare but potential risks and complications were reviewed with the patient and a signed consent was obtained. Pre and post-treatment care was discussed and instructions provided.  Location: nasolabial folds  Filler Type: Juvederm Voluma  Procedure: The area was prepped thoroughly with hibiclens The area was prepped thoroughly with Puracyn. After introducing the needle into the desired treatment area, the syringe plunger was drawn back to ensure there was no flash of blood prior to injecting the filler in order to minimize risk  of intravascular injection and vascular occlusion. After injection of the filler, the treated areas were cleansed and iced to reduce swelling. Post-treatment instructions were reviewed with the patient.       Patient tolerated the procedure well. The patient will call with any problems, questions or concerns prior to their next appointment.   Neoplasm of uncertain behavior of skin Left Forearm - Anterior  New, has been there for about a day - will recheck at follow up appointment   Inflamed seborrheic keratosis (4) R buttocks x 1, R upper arm x 3 With [facility]  Destruction of lesion - R buttocks x 1, R upper arm x 3 Complexity: simple   Destruction method: cryotherapy   Informed consent: discussed and consent obtained   Timeout:  patient name, date of birth, surgical site, and procedure verified Lesion destroyed using liquid nitrogen: Yes   Region frozen until ice ball extended beyond lesion: Yes   Outcome: patient tolerated procedure well with no complications   Post-procedure details: wound care instructions given    Return for appointment as scheduled.  Luther Redo, CMA, am acting as scribe for Sarina Ser, MD .  Documentation: I have reviewed the above documentation for accuracy and completeness, and I agree with the above.  Sarina Ser, MD

## 2020-01-04 ENCOUNTER — Encounter: Payer: Self-pay | Admitting: Dermatology

## 2020-01-19 DIAGNOSIS — J019 Acute sinusitis, unspecified: Secondary | ICD-10-CM | POA: Diagnosis not present

## 2020-01-19 DIAGNOSIS — R030 Elevated blood-pressure reading, without diagnosis of hypertension: Secondary | ICD-10-CM | POA: Diagnosis not present

## 2020-02-14 DIAGNOSIS — H353131 Nonexudative age-related macular degeneration, bilateral, early dry stage: Secondary | ICD-10-CM | POA: Diagnosis not present

## 2020-02-23 DIAGNOSIS — C44311 Basal cell carcinoma of skin of nose: Secondary | ICD-10-CM | POA: Diagnosis not present

## 2020-03-06 ENCOUNTER — Other Ambulatory Visit: Payer: Self-pay | Admitting: Internal Medicine

## 2020-03-09 DIAGNOSIS — Z0000 Encounter for general adult medical examination without abnormal findings: Secondary | ICD-10-CM | POA: Diagnosis not present

## 2020-03-09 DIAGNOSIS — E039 Hypothyroidism, unspecified: Secondary | ICD-10-CM | POA: Diagnosis not present

## 2020-03-16 DIAGNOSIS — E039 Hypothyroidism, unspecified: Secondary | ICD-10-CM | POA: Diagnosis not present

## 2020-03-16 DIAGNOSIS — Z0000 Encounter for general adult medical examination without abnormal findings: Secondary | ICD-10-CM | POA: Diagnosis not present

## 2020-03-16 DIAGNOSIS — M8589 Other specified disorders of bone density and structure, multiple sites: Secondary | ICD-10-CM | POA: Diagnosis not present

## 2020-03-22 ENCOUNTER — Other Ambulatory Visit: Payer: Self-pay | Admitting: Unknown Physician Specialty

## 2020-03-22 DIAGNOSIS — J019 Acute sinusitis, unspecified: Secondary | ICD-10-CM | POA: Diagnosis not present

## 2020-03-27 ENCOUNTER — Other Ambulatory Visit: Payer: Self-pay | Admitting: Unknown Physician Specialty

## 2020-03-28 DIAGNOSIS — R399 Unspecified symptoms and signs involving the genitourinary system: Secondary | ICD-10-CM | POA: Diagnosis not present

## 2020-04-04 ENCOUNTER — Ambulatory Visit: Payer: 59 | Admitting: Dermatology

## 2020-04-08 DIAGNOSIS — Z03818 Encounter for observation for suspected exposure to other biological agents ruled out: Secondary | ICD-10-CM | POA: Diagnosis not present

## 2020-04-08 DIAGNOSIS — J019 Acute sinusitis, unspecified: Secondary | ICD-10-CM | POA: Diagnosis not present

## 2020-04-08 DIAGNOSIS — R059 Cough, unspecified: Secondary | ICD-10-CM | POA: Diagnosis not present

## 2020-04-17 DIAGNOSIS — Z03818 Encounter for observation for suspected exposure to other biological agents ruled out: Secondary | ICD-10-CM | POA: Diagnosis not present

## 2020-04-17 DIAGNOSIS — Z1152 Encounter for screening for COVID-19: Secondary | ICD-10-CM | POA: Diagnosis not present

## 2020-04-19 ENCOUNTER — Other Ambulatory Visit: Payer: Self-pay | Admitting: Unknown Physician Specialty

## 2020-04-19 DIAGNOSIS — R059 Cough, unspecified: Secondary | ICD-10-CM | POA: Diagnosis not present

## 2020-04-19 DIAGNOSIS — J019 Acute sinusitis, unspecified: Secondary | ICD-10-CM | POA: Diagnosis not present

## 2020-04-20 ENCOUNTER — Ambulatory Visit: Payer: 59 | Admitting: Dermatology

## 2020-04-27 ENCOUNTER — Other Ambulatory Visit: Payer: Self-pay | Admitting: Unknown Physician Specialty

## 2020-05-16 ENCOUNTER — Other Ambulatory Visit: Payer: Self-pay | Admitting: Internal Medicine

## 2020-05-16 ENCOUNTER — Other Ambulatory Visit: Payer: Self-pay | Admitting: Orthopedic Surgery

## 2020-05-16 DIAGNOSIS — Z1231 Encounter for screening mammogram for malignant neoplasm of breast: Secondary | ICD-10-CM

## 2020-05-16 DIAGNOSIS — M1732 Unilateral post-traumatic osteoarthritis, left knee: Secondary | ICD-10-CM | POA: Diagnosis not present

## 2020-05-16 DIAGNOSIS — M25562 Pain in left knee: Secondary | ICD-10-CM | POA: Diagnosis not present

## 2020-05-29 DIAGNOSIS — M1712 Unilateral primary osteoarthritis, left knee: Secondary | ICD-10-CM | POA: Insufficient documentation

## 2020-06-01 DIAGNOSIS — C44311 Basal cell carcinoma of skin of nose: Secondary | ICD-10-CM | POA: Diagnosis not present

## 2020-06-02 ENCOUNTER — Other Ambulatory Visit: Payer: Self-pay | Admitting: Unknown Physician Specialty

## 2020-06-07 ENCOUNTER — Ambulatory Visit
Admission: RE | Admit: 2020-06-07 | Discharge: 2020-06-07 | Disposition: A | Discharge status: Home / Self Care | Payer: 59 | Source: Ambulatory Visit | Attending: Internal Medicine | Admitting: Internal Medicine

## 2020-06-07 ENCOUNTER — Other Ambulatory Visit: Payer: Self-pay

## 2020-06-07 DIAGNOSIS — Z1231 Encounter for screening mammogram for malignant neoplasm of breast: Secondary | ICD-10-CM | POA: Insufficient documentation

## 2020-06-13 DIAGNOSIS — M13841 Other specified arthritis, right hand: Secondary | ICD-10-CM | POA: Diagnosis not present

## 2020-06-13 DIAGNOSIS — M720 Palmar fascial fibromatosis [Dupuytren]: Secondary | ICD-10-CM | POA: Diagnosis not present

## 2020-06-16 DIAGNOSIS — M720 Palmar fascial fibromatosis [Dupuytren]: Secondary | ICD-10-CM | POA: Insufficient documentation

## 2020-06-30 DIAGNOSIS — M1712 Unilateral primary osteoarthritis, left knee: Secondary | ICD-10-CM | POA: Diagnosis not present

## 2020-07-07 DIAGNOSIS — M1712 Unilateral primary osteoarthritis, left knee: Secondary | ICD-10-CM | POA: Diagnosis not present

## 2020-07-14 DIAGNOSIS — M1712 Unilateral primary osteoarthritis, left knee: Secondary | ICD-10-CM | POA: Diagnosis not present

## 2020-07-19 DIAGNOSIS — M13841 Other specified arthritis, right hand: Secondary | ICD-10-CM | POA: Diagnosis not present

## 2020-07-19 DIAGNOSIS — M720 Palmar fascial fibromatosis [Dupuytren]: Secondary | ICD-10-CM | POA: Diagnosis not present

## 2020-07-19 DIAGNOSIS — G8918 Other acute postprocedural pain: Secondary | ICD-10-CM | POA: Diagnosis not present

## 2020-07-19 DIAGNOSIS — M1811 Unilateral primary osteoarthritis of first carpometacarpal joint, right hand: Secondary | ICD-10-CM | POA: Diagnosis not present

## 2020-07-26 ENCOUNTER — Other Ambulatory Visit: Payer: Self-pay | Admitting: Physician Assistant

## 2020-07-28 DIAGNOSIS — M720 Palmar fascial fibromatosis [Dupuytren]: Secondary | ICD-10-CM | POA: Diagnosis not present

## 2020-07-28 DIAGNOSIS — M13841 Other specified arthritis, right hand: Secondary | ICD-10-CM | POA: Diagnosis not present

## 2020-07-28 DIAGNOSIS — Z4789 Encounter for other orthopedic aftercare: Secondary | ICD-10-CM | POA: Diagnosis not present

## 2020-08-10 ENCOUNTER — Ambulatory Visit: Payer: 59 | Attending: Internal Medicine

## 2020-08-10 DIAGNOSIS — Z23 Encounter for immunization: Secondary | ICD-10-CM

## 2020-08-10 NOTE — Progress Notes (Signed)
   Covid-19 Vaccination Clinic  Name:  [name]    MRN: [id number] DOB: April 22, 1951  08/10/2020  [name] was observed post Covid-19 immunization for 15 minutes without incident. She was provided with Vaccine Information Sheet and instruction to access the V-Safe system.   [name] was instructed to call 911 with any severe reactions post vaccine: Marland Kitchen Difficulty breathing  . Swelling of face and throat  . A fast heartbeat  . A bad rash all over body  . Dizziness and weakness   Immunizations Administered    Name Date Dose VIS Date Route   PFIZER Comrnaty(Gray TOP) Covid-19 Vaccine 08/10/2020  3:30 PM 0.3 mL 04/13/2020 Intramuscular   Manufacturer: [employer]   Lot: [id number]   [place]: [phone]

## 2020-08-14 ENCOUNTER — Other Ambulatory Visit: Payer: Self-pay

## 2020-08-14 MED ORDER — PFIZER-BIONT COVID-19 VAC-TRIS 30 MCG/0.3ML IM SUSP
INTRAMUSCULAR | 0 refills | Status: DC
  Filled 2020-08-14: qty 0.3, 20d supply, fill #0

## 2020-08-15 ENCOUNTER — Other Ambulatory Visit: Payer: Self-pay

## 2020-08-15 MED ORDER — METHYLPREDNISOLONE 4 MG PO TBPK
ORAL_TABLET | ORAL | 0 refills | Status: DC
  Filled 2020-08-15: qty 21, 6d supply, fill #0

## 2020-08-25 DIAGNOSIS — M13841 Other specified arthritis, right hand: Secondary | ICD-10-CM | POA: Diagnosis not present

## 2020-08-25 DIAGNOSIS — M720 Palmar fascial fibromatosis [Dupuytren]: Secondary | ICD-10-CM | POA: Diagnosis not present

## 2020-08-25 DIAGNOSIS — Z4789 Encounter for other orthopedic aftercare: Secondary | ICD-10-CM | POA: Diagnosis not present

## 2020-08-28 ENCOUNTER — Other Ambulatory Visit: Payer: Self-pay

## 2020-08-28 ENCOUNTER — Ambulatory Visit: Payer: 59 | Attending: Physician Assistant | Admitting: Occupational Therapy

## 2020-08-28 ENCOUNTER — Encounter: Payer: Self-pay | Admitting: Occupational Therapy

## 2020-08-28 DIAGNOSIS — L905 Scar conditions and fibrosis of skin: Secondary | ICD-10-CM | POA: Diagnosis not present

## 2020-08-28 DIAGNOSIS — M25631 Stiffness of right wrist, not elsewhere classified: Secondary | ICD-10-CM | POA: Diagnosis not present

## 2020-08-28 DIAGNOSIS — M6281 Muscle weakness (generalized): Secondary | ICD-10-CM | POA: Insufficient documentation

## 2020-08-28 DIAGNOSIS — M25641 Stiffness of right hand, not elsewhere classified: Secondary | ICD-10-CM | POA: Diagnosis not present

## 2020-08-28 DIAGNOSIS — M79641 Pain in right hand: Secondary | ICD-10-CM | POA: Diagnosis not present

## 2020-08-28 NOTE — Therapy (Signed)
[facility] [address], [place], [address] Phone: [phone]   Fax:  [phone]  Occupational Therapy Evaluation  Patient Details  Name: [name] MRN: [id number] Date of Birth: 28-Nov-1950 Referring Provider (OT): Shellia Cleverly PA   Encounter Date: 08/28/2020   OT End of Session - 08/28/20 1735    Visit Number 1    Number of Visits 12    Date for OT Re-Evaluation 10/09/20    OT Start Time 1515    OT Stop Time 1600    OT Time Calculation (min) 45 min    Activity Tolerance Patient tolerated treatment well    Behavior During Therapy [facility] for tasks assessed/performed           Past Medical History:  Diagnosis Date  . Basal cell carcinoma 12/08/2008   right sup medial breast  . Basal cell carcinoma 06/10/2017   left lat nasal bridge inf to medial canthus  . Basal cell carcinoma 03/02/2019   right distal med calf  . History of dysplastic nevus 05/02/2010   right sup pubic/mild    Past Surgical History:  Procedure Laterality Date  . KNEE SURGERY    . TOTAL VAGINAL HYSTERECTOMY      There were no vitals filed for this visit.   Subjective Assessment - 08/28/20 1726    Subjective  I have seen the Dr last Friday and they took out stitches, and sterristrips still on - and they made this splint - it bothers my thumb    Pertinent History Pt had [place] arthroplasty done 07/19/20 - was in cast and then 08/25/20 stitches was taken out and custom thumb spica was made- pt refer to start OT this date - had trigger finger released on 3rd digit    Patient Stated Goals Want to be able to use my R hand and thumb in every day activities - on computer and at home    Currently in Pain? No/denies             [facility] OT Assessment - 08/28/20 0001      Assessment   Medical Diagnosis R thumb CMC arthroplasty and 3rd trigger finger release    Referring Provider (OT) Shellia Cleverly PA    Onset Date/Surgical  Date 07/19/20    Hand Dominance Left      Precautions   Required Braces or Orthoses --   Thumb spica     Restrictions   Weight Bearing Restrictions Yes    RUE Weight Bearing Non weight bearing      Home  Environment   Lives With Spouse      Prior Function   Vocation Full time employment   work as Glass blower/designer in [place] walk and walk with dog, house work      AROM   Right Wrist Extension 65 Degrees    Right Wrist Flexion 50 Degrees    Right Wrist Radial Deviation 10 Degrees    Right Wrist Ulnar Deviation 24 Degrees    Left Wrist Flexion 75 Degrees      Right Hand AROM   R Thumb MCP 0-60 20 Degrees    R Thumb IP 0-80 50 Degrees    R Thumb Radial ABduction/ADduction 0-55 40    R Thumb Palmar ABduction/ADduction 0-45 45    R Thumb Opposition to Index --   opposition to 3rd digit  OT Treatments/Exercises (OP) - 08/28/20 0001      RUE Contrast Bath   Time 8 minutes    Comments prior to review of HEP         Review with pt HEP and hand out provided :    Contrast 2-3 x day  AROM PA and RA of thumb - pain free Blocked AROM IP flexion of thumb  AAROM for MC flexion of thumb  Circular AROM for thumb - both ways  And 10 reps  Opposition to 3rd digit - 5 reps each  Wrist AROM for flexion , ext, RD, UD 10 reps Pain free  Cont with thumb spica         OT Education - 08/28/20 1734    Education Details findings of eval and HEP    Person(s) Educated Patient    Methods Explanation;Demonstration;Tactile cues;Verbal cues;Handout    Comprehension Verbal cues required;Returned demonstration;Verbalized understanding            OT Short Term Goals - 08/28/20 1739      OT SHORT TERM GOAL #1   Title Pt to be independent in HEP to initiate scar massage, increase AROM for wrist and thumb to wean out of thumb spica splint    Baseline Thumb spica most all the time- sterri strips still in place and decrease AROM in all planes for  thumb and wrist    Time 3    Period Weeks    Status New    Target Date 09/18/20             OT Long Term Goals - 08/28/20 1740      OT LONG TERM GOAL #1   Title Pt R wrist AROM and strength increase to  [facility] for pt able to be able to push up , push and pull door open    Baseline thumb spica most all the time- AROM initiated this date    Time 5    Period Weeks    Status New    Target Date 10/02/20      OT LONG TERM GOAL #2   Title R thumb PA and RA , flexion improve for pt to retrieve objects out of palm and grasp 1/2 full glass    Baseline PA and RA 40-45 degrees, flexion MC 20 ,IP 50 - pain -    Time 8    Period Weeks    Status New    Target Date 10/23/20      OT LONG TERM GOAL #3   Title R thumb strength increase for pt to push bottons and do fasteners    Baseline thumb spica on most all the time- AROM initiated this date -    Time 6    Period Weeks    Status New    Target Date 10/09/20      OT LONG TERM GOAL #4   Title Pt R grip and prehension increase to more than 60 % compare to L to turn car key, hold and pick up books , open cabinets /drawers    Baseline AROM initiated today- thumb spica splint most all the time    Time 8    Period Weeks    Status New    Target Date 10/23/20                 Plan - 08/28/20 1735    Clinical Impression Statement Pt is 5 1/2 wks s/p R thumb CMC arthroplasty and 3rd digit  trigger finger release- pt strerristrips still in place , and show decrease thumb AROM in all planes and wrist decrease AROM with pain - pt to wear custom thumb spica most all the time- off for ADL's - decrease ROM, strength and scar tissue limiting her functional use of R hand in ADL's and IADl's    OT Occupational Profile and History Problem Focused Assessment - Including review of records relating to presenting problem    Occupational performance deficits (Please refer to evaluation for details): ADL's;IADL's;Work;Play;Leisure;Social Participation     Body Structure / Function / Physical Skills ADL;Coordination;FMC;Flexibility;Scar mobility;ROM;IADL;UE functional use;Pain;Dexterity    Rehab Potential Good    Clinical Decision Making Limited treatment options, no task modification necessary    Comorbidities Affecting Occupational Performance: None    Modification or Assistance to Complete Evaluation  No modification of tasks or assist necessary to complete eval    OT Frequency 2x / week   decrease to 1 x wk as she progress   OT Duration 8 weeks    OT Treatment/Interventions Self-care/ADL training;Fluidtherapy;DME and/or AE instruction;Splinting;Therapeutic activities;Contrast Bath;Therapeutic exercise;Scar mobilization;Passive range of motion;Paraffin;Manual Therapy    Consulted and Agree with Plan of Care Patient           Patient will benefit from skilled therapeutic intervention in order to improve the following deficits and impairments:   Body Structure / Function / Physical Skills: ADL,Coordination,FMC,Flexibility,Scar mobility,ROM,IADL,UE functional use,Pain,Dexterity       Visit Diagnosis: Scar condition and fibrosis of skin - Plan: Ot plan of care cert/re-cert  Pain in right hand - Plan: Ot plan of care cert/re-cert  Stiffness of right hand, not elsewhere classified - Plan: Ot plan of care cert/re-cert  Stiffness of right wrist, not elsewhere classified - Plan: Ot plan of care cert/re-cert  Muscle weakness (generalized) - Plan: Ot plan of care cert/re-cert    Problem List Patient Active Problem List   Diagnosis Date Noted  . Osteopenia of multiple sites 08/11/2018  . Near syncope 04/22/2018  . Personal history of other malignant neoplasm of skin 09/24/2017  . Osteoarthritis of carpometacarpal (CMC) joint of thumb 09/05/2017  . Costochondritis 12/26/2015  . Allergic state 02/09/2015  . Adult hypothyroidism 02/09/2015  . OP (osteoporosis) 02/09/2015  . AV (anaerobic vaginosis) 11/18/2014  . Non-traumatic rotator  cuff tear 09/29/2013    Rosalyn Gess OTR/l,CLT 08/28/2020, 5:47 PM  [facility] [address], [place], [address] Phone: [phone]   Fax:  [phone]  Name: [name] MRN: [id number] Date of Birth: [birth year]

## 2020-08-28 NOTE — Patient Instructions (Signed)
Contrast 2-3 x day  AROM PA and RA of thumb - pain free Blocked AROM IP flexion of thumb  AAROM for MC flexion of thumb  Circular AROM for thumb - both ways  And 10 reps  Opposition to 3rd digit - 5 reps each  Wrist AROM for flexion , ext, RD, UD 10 reps Pain free  Cont with thumb spica

## 2020-08-29 ENCOUNTER — Ambulatory Visit (INDEPENDENT_AMBULATORY_CARE_PROVIDER_SITE_OTHER): Payer: Self-pay | Admitting: Dermatology

## 2020-08-29 ENCOUNTER — Other Ambulatory Visit: Payer: Self-pay

## 2020-08-29 DIAGNOSIS — L988 Other specified disorders of the skin and subcutaneous tissue: Secondary | ICD-10-CM

## 2020-08-29 NOTE — Patient Instructions (Signed)

## 2020-08-29 NOTE — Progress Notes (Signed)
   Follow-Up Visit   Subjective  [name] is a 70 y.o. female who presents for the following: Facial Elastosis (Patient would like to discuss Botox and fillers).  The following portions of the chart were reviewed this encounter and updated as appropriate:   Tobacco  Allergies  Meds  Problems  Med Hx  Surg Hx  Fam Hx     Review of Systems:  No other skin or systemic complaints except as noted in HPI or Assessment and Plan.  Objective  Well appearing patient in no apparent distress; mood and affect are within normal limits.  A focused examination was performed including the face. Relevant physical exam findings are noted in the Assessment and Plan.  Objective  Face: Rhytides and volume loss.   Images                                   Assessment & Plan  Elastosis of skin Face  Botox 27.5 units injected into the frown complex   Restylane Silk 61mL injected into: - B/L oral commissure - B/L lat chin creases   Botox Injection - Face Location: See attached image  Informed consent: Discussed risks (infection, pain, bleeding, bruising, swelling, allergic reaction, paralysis of nearby muscles, eyelid droop, double vision, neck weakness, difficulty breathing, headache, undesirable cosmetic result, and need for additional treatment) and benefits of the procedure, as well as the alternatives.  Informed consent was obtained.  Preparation: The area was cleansed with alcohol.  Procedure Details:  Botox was injected into the dermis with a 30-gauge needle. Pressure applied to any bleeding. Ice packs offered for swelling.  Lot Number:  [id number] Expiration:  10/2022  Total Units Injected:  27.5  Plan: Patient was instructed to remain upright for 4 hours. Patient was instructed to avoid massaging the face and avoid vigorous exercise for the rest of the day. Tylenol may be used for headache.  Allow 2 weeks before returning to clinic for additional  dosing as needed. Patient will call for any problems.   Filling material injection - Face Prior to the procedure, the patient's past medical history, allergies and the rare but potential risks and complications were reviewed with the patient and a signed consent was obtained. Pre and post-treatment care was discussed and instructions provided.  Location: perioral  Filler Type: Restylane silk  Lot number: [id number]  Exp date: 03/05/2021  Procedure: The area was prepped thoroughly with Puracyn. After introducing the needle into the desired treatment area, the syringe plunger was drawn back to ensure there was no flash of blood prior to injecting the filler in order to minimize risk of intravascular injection and vascular occlusion. After injection of the filler, the treated areas were cleansed and iced to reduce swelling. Post-treatment instructions were reviewed with the patient.       Patient tolerated the procedure well. The patient will call with any problems, questions or concerns prior to their next appointment.    Return in about 4 months (around 12/29/2020) for Botox.  Luther Redo, CMA, am acting as scribe for Sarina Ser, MD .  Documentation: I have reviewed the above documentation for accuracy and completeness, and I agree with the above.  Sarina Ser, MD

## 2020-08-31 ENCOUNTER — Encounter: Payer: Self-pay | Admitting: Dermatology

## 2020-09-04 ENCOUNTER — Other Ambulatory Visit: Payer: Self-pay

## 2020-09-04 ENCOUNTER — Ambulatory Visit: Payer: 59 | Attending: Physician Assistant | Admitting: Occupational Therapy

## 2020-09-04 DIAGNOSIS — M6281 Muscle weakness (generalized): Secondary | ICD-10-CM | POA: Insufficient documentation

## 2020-09-04 DIAGNOSIS — M79641 Pain in right hand: Secondary | ICD-10-CM | POA: Diagnosis not present

## 2020-09-04 DIAGNOSIS — M25631 Stiffness of right wrist, not elsewhere classified: Secondary | ICD-10-CM | POA: Insufficient documentation

## 2020-09-04 DIAGNOSIS — L905 Scar conditions and fibrosis of skin: Secondary | ICD-10-CM | POA: Diagnosis not present

## 2020-09-04 DIAGNOSIS — M25641 Stiffness of right hand, not elsewhere classified: Secondary | ICD-10-CM | POA: Diagnosis not present

## 2020-09-04 NOTE — Therapy (Signed)
[facility] [address], [place], [address] Phone: [phone]   Fax:  [phone]  Occupational Therapy Treatment  Patient Details  Name: [name] MRN: [id number] Date of Birth: 1950/09/03 Referring Provider (OT): Shellia Cleverly PA   Encounter Date: 09/04/2020   OT End of Session - 09/04/20 1706    Visit Number 2    Number of Visits 12    Date for OT Re-Evaluation 10/09/20    OT Start Time 4235    OT Stop Time 1603    OT Time Calculation (min) 46 min    Activity Tolerance Patient tolerated treatment well    Behavior During Therapy [facility] for tasks assessed/performed           Past Medical History:  Diagnosis Date  . Basal cell carcinoma 12/08/2008   right sup medial breast  . Basal cell carcinoma 06/10/2017   left lat nasal bridge inf to medial canthus  . Basal cell carcinoma 03/02/2019   right distal med calf  . History of dysplastic nevus 05/02/2010   right sup pubic/mild    Past Surgical History:  Procedure Laterality Date  . KNEE SURGERY    . TOTAL VAGINAL HYSTERECTOMY      There were no vitals filed for this visit.   Subjective Assessment - 09/04/20 1517    Subjective  Doing better- no pain - keeping splint on most all the time - sometimes off when sitting    Pertinent History Pt had CMC arthroplasty done 07/19/20 - was in cast and then 08/25/20 stitches was taken out and custom thumb spica was made- pt refer to start OT this date - had trigger finger released on 3rd digit    Patient Stated Goals Want to be able to use my R hand and thumb in every day activities - on computer and at home    Currently in Pain? No/denies              [facility] OT Assessment - 09/04/20 0001      AROM   Right Wrist Extension 70 Degrees    Right Wrist Flexion 60 Degrees    Right Wrist Radial Deviation 15 Degrees    Right Wrist Ulnar Deviation 28 Degrees    Left Wrist Flexion 75 Degrees       Right Hand AROM   R Thumb MCP 0-60 30 Degrees    R Thumb IP 0-80 65 Degrees    R Thumb Radial ABduction/ADduction 0-55 46    R Thumb Palmar ABduction/ADduction 0-45 55    R Thumb Opposition to Index --   Opposition to 5th          AROM assess in wrist and thumb - good progress          OT Treatments/Exercises (OP) - 09/04/20 0001      RUE Fluidotherapy   Number Minutes Fluidotherapy 8 Minutes    RUE Fluidotherapy Location Hand;Wrist    Comments AROM in all planes prior to softissue and ROM             AROM PA and RA of thumb - pain free Add PROM for thumb IP and MC flexion prior to  Blocked AROM IP  And MC flexion of thumb   Circular AROM for thumb - both ways  And 10 reps  Opposition to 5th digit - keeping CMC out of palm  - 5 reps each  Add Wrist AAROM or light  stretch for flexion 10 reps hold 5 sec  Prior to Wrist AROM for flexion , ext, RD, UD 10 reps Isometric for thumb in 45 degrees of PA- 10 reps in all planes - PA/RA,flexion, Ext 10 reps - 3 x day  Pain free  Cont with thumb spica          OT Education - 09/04/20 1706    Education Details progress and HEP    Person(s) Educated Patient    Methods Explanation;Demonstration;Tactile cues;Verbal cues;Handout    Comprehension Verbal cues required;Returned demonstration;Verbalized understanding            OT Short Term Goals - 08/28/20 1739      OT SHORT TERM GOAL #1   Title Pt to be independent in HEP to initiate scar massage, increase AROM for wrist and thumb to wean out of thumb spica splint    Baseline Thumb spica most all the time- sterri strips still in place and decrease AROM in all planes for thumb and wrist    Time 3    Period Weeks    Status New    Target Date 09/18/20             OT Long Term Goals - 08/28/20 1740      OT LONG TERM GOAL #1   Title Pt R wrist AROM and strength increase to  [facility] for pt able to be able to push up , push and pull door open    Baseline thumb spica  most all the time- AROM initiated this date    Time 5    Period Weeks    Status New    Target Date 10/02/20      OT LONG TERM GOAL #2   Title R thumb PA and RA , flexion improve for pt to retrieve objects out of palm and grasp 1/2 full glass    Baseline PA and RA 40-45 degrees, flexion MC 20 ,IP 50 - pain -    Time 8    Period Weeks    Status New    Target Date 10/23/20      OT LONG TERM GOAL #3   Title R thumb strength increase for pt to push bottons and do fasteners    Baseline thumb spica on most all the time- AROM initiated this date -    Time 6    Period Weeks    Status New    Target Date 10/09/20      OT LONG TERM GOAL #4   Title Pt R grip and prehension increase to more than 60 % compare to L to turn car key, hold and pick up books , open cabinets /drawers    Baseline AROM initiated today- thumb spica splint most all the time    Time 8    Period Weeks    Status New    Target Date 10/23/20                 Plan - 09/04/20 1707    Clinical Impression Statement Pt is 6 1/2 wks s/p R thumb CMC arthroplasty and 3rd digit trigger finger release- Pt show increase AROM in thumb in all planes and wrist AROM- but mostly limited in wrist flexion , thumb flexion at [facility] and composite - but able to stabilze [facility] with opposition to 5th- add some isometric for thumb in all planes and PROM to wrist flexion  - scar massage and cica scar pad for night time use  -  pt to wear custom thumb spica most all the time- off for ADL's or when sitting  - decrease ROM, strength and scar tissue limiting her functional use of R hand in ADL's and IADl's    OT Occupational Profile and History Problem Focused Assessment - Including review of records relating to presenting problem    Occupational performance deficits (Please refer to evaluation for details): ADL's;IADL's;Work;Play;Leisure;Social Participation    Body Structure / Function / Physical Skills ADL;Coordination;FMC;Flexibility;Scar  mobility;ROM;IADL;UE functional use;Pain;Dexterity    Rehab Potential Good    Clinical Decision Making Limited treatment options, no task modification necessary    Comorbidities Affecting Occupational Performance: None    Modification or Assistance to Complete Evaluation  No modification of tasks or assist necessary to complete eval    OT Frequency 2x / week    OT Duration 8 weeks    OT Treatment/Interventions Self-care/ADL training;Fluidtherapy;DME and/or AE instruction;Splinting;Therapeutic activities;Contrast Bath;Therapeutic exercise;Scar mobilization;Passive range of motion;Paraffin;Manual Therapy    Consulted and Agree with Plan of Care Patient           Patient will benefit from skilled therapeutic intervention in order to improve the following deficits and impairments:   Body Structure / Function / Physical Skills: ADL,Coordination,FMC,Flexibility,Scar mobility,ROM,IADL,UE functional use,Pain,Dexterity       Visit Diagnosis: Scar condition and fibrosis of skin  Pain in right hand  Stiffness of right hand, not elsewhere classified  Stiffness of right wrist, not elsewhere classified  Muscle weakness (generalized)    Problem List Patient Active Problem List   Diagnosis Date Noted  . Osteopenia of multiple sites 08/11/2018  . Near syncope 04/22/2018  . Personal history of other malignant neoplasm of skin 09/24/2017  . Osteoarthritis of carpometacarpal (CMC) joint of thumb 09/05/2017  . Costochondritis 12/26/2015  . Allergic state 02/09/2015  . Adult hypothyroidism 02/09/2015  . OP (osteoporosis) 02/09/2015  . AV (anaerobic vaginosis) 11/18/2014  . Non-traumatic rotator cuff tear 09/29/2013    Rosalyn Gess OTR/L,CLT 09/04/2020, 5:10 PM  [facility] [address] [place], [address] Phone: [phone]   Fax:  [phone]  Name: LUWANNA BROSSMAN MRN: [id number] Date of Birth: Sep 14, 1950

## 2020-09-05 ENCOUNTER — Other Ambulatory Visit: Payer: Self-pay

## 2020-09-05 ENCOUNTER — Other Ambulatory Visit: Payer: Self-pay | Admitting: Internal Medicine

## 2020-09-05 MED FILL — Levothyroxine Sodium Tab 50 MCG: ORAL | 90 days supply | Qty: 90 | Fill #0 | Status: AC

## 2020-09-05 MED FILL — Fluticasone Propionate Nasal Susp 50 MCG/ACT: NASAL | 30 days supply | Qty: 16 | Fill #0 | Status: AC

## 2020-09-06 ENCOUNTER — Other Ambulatory Visit: Payer: Self-pay

## 2020-09-07 ENCOUNTER — Other Ambulatory Visit: Payer: Self-pay

## 2020-09-07 ENCOUNTER — Ambulatory Visit: Payer: 59 | Admitting: Occupational Therapy

## 2020-09-07 DIAGNOSIS — M6281 Muscle weakness (generalized): Secondary | ICD-10-CM | POA: Diagnosis not present

## 2020-09-07 DIAGNOSIS — M79641 Pain in right hand: Secondary | ICD-10-CM

## 2020-09-07 DIAGNOSIS — L905 Scar conditions and fibrosis of skin: Secondary | ICD-10-CM

## 2020-09-07 DIAGNOSIS — M25641 Stiffness of right hand, not elsewhere classified: Secondary | ICD-10-CM | POA: Diagnosis not present

## 2020-09-07 DIAGNOSIS — M25631 Stiffness of right wrist, not elsewhere classified: Secondary | ICD-10-CM | POA: Diagnosis not present

## 2020-09-07 MED ORDER — MONTELUKAST SODIUM 10 MG PO TABS
1.0000 | ORAL_TABLET | Freq: Every day | ORAL | 1 refills | Status: DC
  Filled 2020-09-07: qty 90, 90d supply, fill #0
  Filled 2020-12-09: qty 90, 90d supply, fill #1

## 2020-09-07 NOTE — Therapy (Signed)
[facility] [address] [place], [address] Phone: [phone]   Fax:  [phone]  Occupational Therapy Treatment  Patient Details  Name: [name] MRN: [id number] Date of Birth: 26-Feb-1951 Referring Provider (OT): Shellia Cleverly PA   Encounter Date: 09/07/2020   OT End of Session - 09/07/20 2131    Visit Number 3    Number of Visits 12    Date for OT Re-Evaluation 10/09/20    OT Start Time 4132    OT Stop Time 1616    OT Time Calculation (min) 41 min    Activity Tolerance Patient tolerated treatment well    Behavior During Therapy [facility] for tasks assessed/performed           Past Medical History:  Diagnosis Date  . Basal cell carcinoma 12/08/2008   right sup medial breast  . Basal cell carcinoma 06/10/2017   left lat nasal bridge inf to medial canthus  . Basal cell carcinoma 03/02/2019   right distal med calf  . History of dysplastic nevus 05/02/2010   right sup pubic/mild    Past Surgical History:  Procedure Laterality Date  . KNEE SURGERY    . TOTAL VAGINAL HYSTERECTOMY      There were no vitals filed for this visit.   Subjective Assessment - 09/07/20 2130    Subjective  Doing okay - no pain and doing my exercises - can't wait for this splint to come off    Pertinent History Pt had CMC arthroplasty done 07/19/20 - was in cast and then 08/25/20 stitches was taken out and custom thumb spica was made- pt refer to start OT this date - had trigger finger released on 3rd digit    Patient Stated Goals Want to be able to use my R hand and thumb in every day activities - on computer and at home    Currently in Pain? No/denies              [facility] OT Assessment - 09/07/20 0001      AROM   Right Wrist Extension 70 Degrees (P)     Right Wrist Flexion 68 Degrees (P)     Left Wrist Flexion 75 Degrees (P)       Right Hand AROM   R Thumb MCP 0-60 35 Degrees (P)            cont to show  progress in AROM for thumb , wrist  With RD and wrist flexion mostly limited          OT Treatments/Exercises (OP) - 09/07/20 0001      RUE Fluidotherapy   Number Minutes Fluidotherapy 8 Minutes    RUE Fluidotherapy Location Hand;Wrist    Comments AROM for wrist and digits in all planes         scar massage done to distal scar - some adhesion - pt to do scar massage and cont cica scar pad - also on palmar scar    AROM PA and RA of thumb - pain free Add PROM for thumb IP and MC flexion last time and now composite gentle PROM  Blocked AROM IP  And MC flexion of thumb   Circular AROM for thumb - both ways  And 10 reps  Opposition to 5th digit - keeping CMC out of palm  - 5 reps each  Upgrade to Wrist AAROM for wrist flexion, ext and RD- over edge of table- 10 reps pain  free  Prior to Wrist AROM for flexion , ext, RD, UD 10 reps Rubber band add for PA and RA of thumb- 10 reps pain free  2 x day    Pain free  Cont with thumb spica         OT Education - 09/07/20 2131    Education Details progress and HEP    Person(s) Educated Patient    Methods Explanation;Demonstration;Tactile cues;Verbal cues;Handout    Comprehension Verbal cues required;Returned demonstration;Verbalized understanding            OT Short Term Goals - 08/28/20 1739      OT SHORT TERM GOAL #1   Title Pt to be independent in HEP to initiate scar massage, increase AROM for wrist and thumb to wean out of thumb spica splint    Baseline Thumb spica most all the time- sterri strips still in place and decrease AROM in all planes for thumb and wrist    Time 3    Period Weeks    Status New    Target Date 09/18/20             OT Long Term Goals - 08/28/20 1740      OT LONG TERM GOAL #1   Title Pt R wrist AROM and strength increase to  [facility] for pt able to be able to push up , push and pull door open    Baseline thumb spica most all the time- AROM initiated this date    Time 5    Period Weeks     Status New    Target Date 10/02/20      OT LONG TERM GOAL #2   Title R thumb PA and RA , flexion improve for pt to retrieve objects out of palm and grasp 1/2 full glass    Baseline PA and RA 40-45 degrees, flexion MC 20 ,IP 50 - pain -    Time 8    Period Weeks    Status New    Target Date 10/23/20      OT LONG TERM GOAL #3   Title R thumb strength increase for pt to push bottons and do fasteners    Baseline thumb spica on most all the time- AROM initiated this date -    Time 6    Period Weeks    Status New    Target Date 10/09/20      OT LONG TERM GOAL #4   Title Pt R grip and prehension increase to more than 60 % compare to L to turn car key, hold and pick up books , open cabinets /drawers    Baseline AROM initiated today- thumb spica splint most all the time    Time 8    Period Weeks    Status New    Target Date 10/23/20                 Plan - 09/07/20 2132    Clinical Impression Statement Pt is 7 wks s/p R thumb CMC arthroplasty and 3rd digit trigger finger release- Pt show increase AROM in thumb in all planes and wrist AROM- but mostly limited in wrist flexion , thumb flexion at [facility] and composite - add this date PROM gentle for composite flexion to thumb and AAROM edge of table for wrist flexion, ext, and RD- pain free - pt able to stabilze [facility] with opposition to 5th- upgrade to some HEP with rubber band for PA and RA to thumb  -  scar massage and cica scar pad for night time use  - pt to wear custom thumb spica most all the time- off for ADL's or when sitting  - decrease ROM, strength and scar tissue limiting her functional use of R hand in ADL's and IADl's    OT Occupational Profile and History Problem Focused Assessment - Including review of records relating to presenting problem    Occupational performance deficits (Please refer to evaluation for details): ADL's;IADL's;Work;Play;Leisure;Social Participation    Body Structure / Function / Physical Skills  ADL;Coordination;FMC;Flexibility;Scar mobility;ROM;IADL;UE functional use;Pain;Dexterity    Rehab Potential Good    Clinical Decision Making Limited treatment options, no task modification necessary    Comorbidities Affecting Occupational Performance: None    Modification or Assistance to Complete Evaluation  No modification of tasks or assist necessary to complete eval    OT Frequency 2x / week    OT Duration 8 weeks    OT Treatment/Interventions Self-care/ADL training;Fluidtherapy;DME and/or AE instruction;Splinting;Therapeutic activities;Contrast Bath;Therapeutic exercise;Scar mobilization;Passive range of motion;Paraffin;Manual Therapy    Consulted and Agree with Plan of Care Patient           Patient will benefit from skilled therapeutic intervention in order to improve the following deficits and impairments:   Body Structure / Function / Physical Skills: ADL,Coordination,FMC,Flexibility,Scar mobility,ROM,IADL,UE functional use,Pain,Dexterity       Visit Diagnosis: Scar condition and fibrosis of skin  Pain in right hand  Muscle weakness (generalized)    Problem List Patient Active Problem List   Diagnosis Date Noted  . Osteopenia of multiple sites 08/11/2018  . Near syncope 04/22/2018  . Personal history of other malignant neoplasm of skin 09/24/2017  . Osteoarthritis of carpometacarpal (CMC) joint of thumb 09/05/2017  . Costochondritis 12/26/2015  . Allergic state 02/09/2015  . Adult hypothyroidism 02/09/2015  . OP (osteoporosis) 02/09/2015  . AV (anaerobic vaginosis) 11/18/2014  . Non-traumatic rotator cuff tear 09/29/2013    Rosalyn Gess OTR/L,CLT 09/07/2020, 9:35 PM  [facility] [address] [place], [address] Phone: [phone]   Fax:  [phone]  Name: [name] MRN: [id number] Date of Birth: [birth year]

## 2020-09-08 ENCOUNTER — Other Ambulatory Visit: Payer: Self-pay

## 2020-09-11 ENCOUNTER — Other Ambulatory Visit: Payer: Self-pay

## 2020-09-11 ENCOUNTER — Ambulatory Visit: Payer: 59 | Admitting: Occupational Therapy

## 2020-09-11 DIAGNOSIS — M25631 Stiffness of right wrist, not elsewhere classified: Secondary | ICD-10-CM | POA: Diagnosis not present

## 2020-09-11 DIAGNOSIS — M25641 Stiffness of right hand, not elsewhere classified: Secondary | ICD-10-CM

## 2020-09-11 DIAGNOSIS — M79641 Pain in right hand: Secondary | ICD-10-CM

## 2020-09-11 DIAGNOSIS — L905 Scar conditions and fibrosis of skin: Secondary | ICD-10-CM

## 2020-09-11 DIAGNOSIS — M6281 Muscle weakness (generalized): Secondary | ICD-10-CM

## 2020-09-11 NOTE — Therapy (Signed)
[facility] [address] [place], [address] Phone: [phone]   Fax:  [phone]  Occupational Therapy Treatment  Patient Details  Name: [name] MRN: [id number] Date of Birth: Feb 17, 1951 Referring Provider (OT): Shellia Cleverly PA   Encounter Date: 09/11/2020   OT End of Session - 09/11/20 1534    Visit Number 4    Number of Visits 12    Date for OT Re-Evaluation 10/09/20    OT Start Time 4403    OT Stop Time 1558    OT Time Calculation (min) 43 min    Activity Tolerance Patient tolerated treatment well    Behavior During Therapy [facility] for tasks assessed/performed           Past Medical History:  Diagnosis Date  . Basal cell carcinoma 12/08/2008   right sup medial breast  . Basal cell carcinoma 06/10/2017   left lat nasal bridge inf to medial canthus  . Basal cell carcinoma 03/02/2019   right distal med calf  . History of dysplastic nevus 05/02/2010   right sup pubic/mild    Past Surgical History:  Procedure Laterality Date  . KNEE SURGERY    . TOTAL VAGINAL HYSTERECTOMY      There were no vitals filed for this visit.   Subjective Assessment - 09/11/20 1517    Subjective  It was so cold ths weekend - we were in [place] this weekend- done this thing that to hold ticket and scratch off - so my thumb was sore from holding ticket    Pertinent History Pt had [place] arthroplasty done 07/19/20 - was in cast and then 08/25/20 stitches was taken out and custom thumb spica was made- pt refer to start OT this date - had trigger finger released on 3rd digit    Patient Stated Goals Want to be able to use my R hand and thumb in every day activities - on computer and at home    Currently in Pain? Yes    Pain Score 1     Pain Location Hand    Pain Orientation Right    Pain Descriptors / Indicators Aching    Pain Type Surgical pain              [facility] OT Assessment - 09/11/20 0001      Strength    Right Hand Grip (lbs) 15    Right Hand Lateral Pinch 5 lbs    Right Hand 3 Point Pinch 3 lbs    Left Hand Grip (lbs) 42    Left Hand Lateral Pinch 11 lbs    Left Hand 3 Point Pinch 11 lbs             Pt arrive with some soreness in thumb because of using it over weekend doing some scratch off tickets -and holding it        OT Treatments/Exercises (OP) - 09/11/20 0001      RUE Fluidotherapy   Number Minutes Fluidotherapy 8 Minutes    RUE Fluidotherapy Location Hand;Wrist    Comments AROM for wrist flexion, ext and all digits          soreness decrease    scar massage done to palmar scar - using mini massager and xtractor- and stretching into 3rd digit extention   some adhesion - pt to do scar massage and cont cica scar pad -   AAROM PA and RA of thumb - pain free by  OT Cont PROM for thumb IP and MC flexion last time and now composite gentle PROM  Blocked AROM IPAnd MCflexion of thumb   Circular AROM for thumb - both ways  And 10 reps  Opposition to5th digit - keeping CMC out of palm- 5 reps each  Done by OT  Wrist AAROM for wrist flexion, ext and RD- over edge of table- 10 reps pain free  Prior toWrist AROM for flexion , ext, RD, UD 10 reps Rubber band add for PA and RA of thumb- 10 reps pain free  2 x day  Wean pt into [facility] neoprene splint - to use or alternate during day with thumb spica2 hrs on and off  Pt ed on wearing and donn/doff           OT Education - 09/11/20 1533    Education Details progress and HEP    Person(s) Educated Patient    Methods Explanation;Demonstration;Tactile cues;Verbal cues;Handout    Comprehension Verbal cues required;Returned demonstration;Verbalized understanding            OT Short Term Goals - 08/28/20 1739      OT SHORT TERM GOAL #1   Title Pt to be independent in HEP to initiate scar massage, increase AROM for wrist and thumb to wean out of thumb spica splint    Baseline Thumb spica most all the time-  sterri strips still in place and decrease AROM in all planes for thumb and wrist    Time 3    Period Weeks    Status New    Target Date 09/18/20             OT Long Term Goals - 08/28/20 1740      OT LONG TERM GOAL #1   Title Pt R wrist AROM and strength increase to  [facility] for pt able to be able to push up , push and pull door open    Baseline thumb spica most all the time- AROM initiated this date    Time 5    Period Weeks    Status New    Target Date 10/02/20      OT LONG TERM GOAL #2   Title R thumb PA and RA , flexion improve for pt to retrieve objects out of palm and grasp 1/2 full glass    Baseline PA and RA 40-45 degrees, flexion MC 20 ,IP 50 - pain -    Time 8    Period Weeks    Status New    Target Date 10/23/20      OT LONG TERM GOAL #3   Title R thumb strength increase for pt to push bottons and do fasteners    Baseline thumb spica on most all the time- AROM initiated this date -    Time 6    Period Weeks    Status New    Target Date 10/09/20      OT LONG TERM GOAL #4   Title Pt R grip and prehension increase to more than 60 % compare to L to turn car key, hold and pick up books , open cabinets /drawers    Baseline AROM initiated today- thumb spica splint most all the time    Time 8    Period Weeks    Status New    Target Date 10/23/20                 Plan - 09/11/20 1534    Clinical Impression Statement  Pt is 71/2 wks s/p R thumb CMC arthroplasty and 3rd digit trigger finger release/dupuytrens release- Pt cont to show increase AROM in wrist and thumb -but mostly limited in wrist flexion , thumb flexion at [facility] and composite -  pt to cont with same HEP PROM gentle for composite flexion to thumb and AAROM edge of table for wrist flexion, ext, and RD- pain free - pt able to stabilze [facility] with opposition to 5th- upgrade lst time rubber band for PA and RA to thumb  -focus on palmar scar tissue this date and 3rd digit extention - pt to cont scar massage and  cica scar pad for night time use  -can start weaning into [facility] neoprene splint 2 hrs on and custom thumb spica 2hrs during day - alternating pain free - decrease ROM, strength and scar tissue limiting her functional use of R hand in ADL's and IADl's    OT Occupational Profile and History Problem Focused Assessment - Including review of records relating to presenting problem    Occupational performance deficits (Please refer to evaluation for details): ADL's;IADL's;Work;Play;Leisure;Social Participation    Body Structure / Function / Physical Skills ADL;Coordination;FMC;Flexibility;Scar mobility;ROM;IADL;UE functional use;Pain;Dexterity    Rehab Potential Good    Clinical Decision Making Limited treatment options, no task modification necessary    Comorbidities Affecting Occupational Performance: None    Modification or Assistance to Complete Evaluation  No modification of tasks or assist necessary to complete eval    OT Frequency 2x / week    OT Duration 8 weeks    OT Treatment/Interventions Self-care/ADL training;Fluidtherapy;DME and/or AE instruction;Splinting;Therapeutic activities;Contrast Bath;Therapeutic exercise;Scar mobilization;Passive range of motion;Paraffin;Manual Therapy    Consulted and Agree with Plan of Care Patient           Patient will benefit from skilled therapeutic intervention in order to improve the following deficits and impairments:   Body Structure / Function / Physical Skills: ADL,Coordination,FMC,Flexibility,Scar mobility,ROM,IADL,UE functional use,Pain,Dexterity       Visit Diagnosis: Pain in right hand  Scar condition and fibrosis of skin  Muscle weakness (generalized)  Stiffness of right hand, not elsewhere classified  Stiffness of right wrist, not elsewhere classified    Problem List Patient Active Problem List   Diagnosis Date Noted  . Osteopenia of multiple sites 08/11/2018  . Near syncope 04/22/2018  . Personal history of other malignant  neoplasm of skin 09/24/2017  . Osteoarthritis of carpometacarpal (CMC) joint of thumb 09/05/2017  . Costochondritis 12/26/2015  . Allergic state 02/09/2015  . Adult hypothyroidism 02/09/2015  . OP (osteoporosis) 02/09/2015  . AV (anaerobic vaginosis) 11/18/2014  . Non-traumatic rotator cuff tear 09/29/2013    Rosalyn Gess OTR/L,CLT 09/11/2020, 4:55 PM  [facility] [address], [place], [address] Phone: [phone]   Fax:  [phone]  Name: [name] MRN: [id number] Date of Birth: 08-Nov-1950

## 2020-09-14 ENCOUNTER — Ambulatory Visit: Payer: 59 | Admitting: Occupational Therapy

## 2020-09-14 ENCOUNTER — Other Ambulatory Visit: Payer: Self-pay

## 2020-09-14 DIAGNOSIS — M6281 Muscle weakness (generalized): Secondary | ICD-10-CM | POA: Diagnosis not present

## 2020-09-14 DIAGNOSIS — L905 Scar conditions and fibrosis of skin: Secondary | ICD-10-CM | POA: Diagnosis not present

## 2020-09-14 DIAGNOSIS — M79641 Pain in right hand: Secondary | ICD-10-CM | POA: Diagnosis not present

## 2020-09-14 DIAGNOSIS — M25631 Stiffness of right wrist, not elsewhere classified: Secondary | ICD-10-CM | POA: Diagnosis not present

## 2020-09-14 DIAGNOSIS — M25641 Stiffness of right hand, not elsewhere classified: Secondary | ICD-10-CM

## 2020-09-14 NOTE — Therapy (Signed)
[facility] [address] [place], [address] Phone: [phone]   Fax:  [phone]  Occupational Therapy Treatment  Patient Details  Name: [name] MRN: [id number] Date of Birth: 1950-05-27 Referring Provider (OT): Shellia Cleverly PA   Encounter Date: 09/14/2020   OT End of Session - 09/14/20 1905    Visit Number 5    Number of Visits 12    Date for OT Re-Evaluation 10/09/20    OT Start Time 5573    OT Stop Time 1620    OT Time Calculation (min) 42 min    Activity Tolerance Patient tolerated treatment well    Behavior During Therapy [facility] for tasks assessed/performed           Past Medical History:  Diagnosis Date  . Basal cell carcinoma 12/08/2008   right sup medial breast  . Basal cell carcinoma 06/10/2017   left lat nasal bridge inf to medial canthus  . Basal cell carcinoma 03/02/2019   right distal med calf  . History of dysplastic nevus 05/02/2010   right sup pubic/mild    Past Surgical History:  Procedure Laterality Date  . KNEE SURGERY    . TOTAL VAGINAL HYSTERECTOMY      There were no vitals filed for this visit.   Subjective Assessment - 09/14/20 1903    Subjective  DOing okay - done the scar massage over the scar pad- feels better- where can I order some of this scar pad - wore the soft black splint 2hrs this am and 2 hrs this pm- alternating    Pertinent History Pt had CMC arthroplasty done 07/19/20 - was in cast and then 08/25/20 stitches was taken out and custom thumb spica was made- pt refer to start OT this date - had trigger finger released on 3rd digit    Patient Stated Goals Want to be able to use my R hand and thumb in every day activities - on computer and at home    Currently in Pain? No/denies              scar improving in palm and 3rd digit extention  Add and review table slides for wrist and digits extention to HEP - tolerate well 20 reps            OT Treatments/Exercises (OP) - 09/14/20 0001      RUE Paraffin   Number Minutes Paraffin 8 Minutes    RUE Paraffin Location Hand   wrist   Comments prior to scar massage and done wrist flexion strech 2x 2 min           soreness decrease    scar massage done to palmar scar - using mini massager - and stretching into 3rd digit extention   some adhesion improved since last time - pt to do scar massage and cont cica scar pad -   Cont PROM for thumb IP and MC flexionand composite gentle PROM   Circular AROM for thumb - both ways   10 reps  Opposition to5th digit - keeping CMC out of palm- 5 reps each Wrist AAROMfor wrist flexion, ext and RD- over edge of table- 10 reps pain free Prior to 1lbs or 16oz hammer for Wrist in  flexion , ext, RD, UD, sup ,pron pain free  10 reps Rubber band  PA and RA of thumb- 10 reps pain free 2 reps 2 x day Wean pt into [facility] neoprene splint 3hrs  now  - to use or alternate during day with thumb spica2 hrs on and off  Pt ed on wearing and donn/doff         OT Education - 09/14/20 1905    Education Details progress and HEP    Person(s) Educated Patient    Methods Explanation;Demonstration;Tactile cues;Verbal cues;Handout    Comprehension Verbal cues required;Returned demonstration;Verbalized understanding            OT Short Term Goals - 08/28/20 1739      OT SHORT TERM GOAL #1   Title Pt to be independent in HEP to initiate scar massage, increase AROM for wrist and thumb to wean out of thumb spica splint    Baseline Thumb spica most all the time- sterri strips still in place and decrease AROM in all planes for thumb and wrist    Time 3    Period Weeks    Status New    Target Date 09/18/20             OT Long Term Goals - 08/28/20 1740      OT LONG TERM GOAL #1   Title Pt R wrist AROM and strength increase to  [facility] for pt able to be able to push up , push and pull door open    Baseline thumb spica  most all the time- AROM initiated this date    Time 5    Period Weeks    Status New    Target Date 10/02/20      OT LONG TERM GOAL #2   Title R thumb PA and RA , flexion improve for pt to retrieve objects out of palm and grasp 1/2 full glass    Baseline PA and RA 40-45 degrees, flexion MC 20 ,IP 50 - pain -    Time 8    Period Weeks    Status New    Target Date 10/23/20      OT LONG TERM GOAL #3   Title R thumb strength increase for pt to push bottons and do fasteners    Baseline thumb spica on most all the time- AROM initiated this date -    Time 6    Period Weeks    Status New    Target Date 10/09/20      OT LONG TERM GOAL #4   Title Pt R grip and prehension increase to more than 60 % compare to L to turn car key, hold and pick up books , open cabinets /drawers    Baseline AROM initiated today- thumb spica splint most all the time    Time 8    Period Weeks    Status New    Target Date 10/23/20                 Plan - 09/14/20 1905    Clinical Impression Statement Pt is 8 wks s/p R thumb CMC arthroplasty and 3rd digit trigger finger release/dupuytrens release- Pt cont to show increase AROM in wrist and thumb -but mostly limited in wrist flexion , thumb flexion at [facility] and composite -  pt to cont with same PROM gentle for composite flexion to thumb at [facility] and IP and AAROM edge of table for wrist flexion, ext, and RD- pain free - pt able to stabilze [facility] with opposition to 5th- cont with rubber band for PA and RA to thumb  -focus on palmar scar tissue this date  again and 3rd digit extention - pt  to cont scar massage and cica scar pad for night time use  -cont to wean into [facility] neoprene splint 3 hrs on and custom thumb spica 2hrs during day - alternating pain free - decrease ROM, strength and scar tissue limiting her functional use of R hand in ADL's and IADl's.    OT Occupational Profile and History Problem Focused Assessment - Including review of records relating to presenting  problem    Occupational performance deficits (Please refer to evaluation for details): ADL's;IADL's;Work;Play;Leisure;Social Participation    Body Structure / Function / Physical Skills ADL;Coordination;FMC;Flexibility;Scar mobility;ROM;IADL;UE functional use;Pain;Dexterity    Rehab Potential Good    Clinical Decision Making Limited treatment options, no task modification necessary    Comorbidities Affecting Occupational Performance: None    Modification or Assistance to Complete Evaluation  No modification of tasks or assist necessary to complete eval    OT Frequency 2x / week    OT Duration 8 weeks    OT Treatment/Interventions Self-care/ADL training;Fluidtherapy;DME and/or AE instruction;Splinting;Therapeutic activities;Contrast Bath;Therapeutic exercise;Scar mobilization;Passive range of motion;Paraffin;Manual Therapy    Consulted and Agree with Plan of Care Patient           Patient will benefit from skilled therapeutic intervention in order to improve the following deficits and impairments:   Body Structure / Function / Physical Skills: ADL,Coordination,FMC,Flexibility,Scar mobility,ROM,IADL,UE functional use,Pain,Dexterity       Visit Diagnosis: Pain in right hand  Scar condition and fibrosis of skin  Muscle weakness (generalized)  Stiffness of right hand, not elsewhere classified  Stiffness of right wrist, not elsewhere classified    Problem List Patient Active Problem List   Diagnosis Date Noted  . Osteopenia of multiple sites 08/11/2018  . Near syncope 04/22/2018  . Personal history of other malignant neoplasm of skin 09/24/2017  . Osteoarthritis of carpometacarpal (CMC) joint of thumb 09/05/2017  . Costochondritis 12/26/2015  . Allergic state 02/09/2015  . Adult hypothyroidism 02/09/2015  . OP (osteoporosis) 02/09/2015  . AV (anaerobic vaginosis) 11/18/2014  . Non-traumatic rotator cuff tear 09/29/2013    Rosalyn Gess OTR/L,CLT 09/14/2020, 7:09  PM  [facility] [address] [place], [address] Phone: [phone]   Fax:  [phone]  Name: [name] MRN: [id number] Date of Birth: Jul 26, 1950

## 2020-09-15 DIAGNOSIS — Z0000 Encounter for general adult medical examination without abnormal findings: Secondary | ICD-10-CM | POA: Diagnosis not present

## 2020-09-15 DIAGNOSIS — E039 Hypothyroidism, unspecified: Secondary | ICD-10-CM | POA: Diagnosis not present

## 2020-09-18 ENCOUNTER — Other Ambulatory Visit: Payer: Self-pay

## 2020-09-18 ENCOUNTER — Ambulatory Visit: Payer: 59 | Admitting: Occupational Therapy

## 2020-09-18 DIAGNOSIS — L905 Scar conditions and fibrosis of skin: Secondary | ICD-10-CM

## 2020-09-18 DIAGNOSIS — M25631 Stiffness of right wrist, not elsewhere classified: Secondary | ICD-10-CM

## 2020-09-18 DIAGNOSIS — M25641 Stiffness of right hand, not elsewhere classified: Secondary | ICD-10-CM

## 2020-09-18 DIAGNOSIS — M79641 Pain in right hand: Secondary | ICD-10-CM

## 2020-09-18 DIAGNOSIS — M6281 Muscle weakness (generalized): Secondary | ICD-10-CM | POA: Diagnosis not present

## 2020-09-18 NOTE — Therapy (Signed)
[place] [facility] [address] [place], [address] Phone: [phone]   Fax:  [phone]  Occupational Therapy Treatment  Patient Details  Name: [name] MRN: [id number] Date of Birth: 09/11/1950 Referring Provider (OT): Shellia Cleverly PA   Encounter Date: 09/18/2020   OT End of Session - 09/18/20 1700    Visit Number 6    Number of Visits 12    Date for OT Re-Evaluation 10/09/20    OT Start Time 1435    OT Stop Time 1519    OT Time Calculation (min) 44 min    Activity Tolerance Patient tolerated treatment well    Behavior During Therapy [facility] for tasks assessed/performed           Past Medical History:  Diagnosis Date  . Basal cell carcinoma 12/08/2008   right sup medial breast  . Basal cell carcinoma 06/10/2017   left lat nasal bridge inf to medial canthus  . Basal cell carcinoma 03/02/2019   right distal med calf  . History of dysplastic nevus 05/02/2010   right sup pubic/mild    Past Surgical History:  Procedure Laterality Date  . KNEE SURGERY    . TOTAL VAGINAL HYSTERECTOMY      There were no vitals filed for this visit.   Subjective Assessment - 09/18/20 1657    Subjective  DOing okay - done the scar massage over the scar pad- more strength- and not a lot of pain - soft splint on for 4 hrs at time    Pertinent History Pt had CMC arthroplasty done 07/19/20 - was in cast and then 08/25/20 stitches was taken out and custom thumb spica was made- pt refer to start OT this date - had trigger finger released on 3rd digit    Patient Stated Goals Want to be able to use my R hand and thumb in every day activities - on computer and at home    Currently in Pain? No/denies              [facility] OT Assessment - 09/18/20 0001      AROM   Right Wrist Extension 75 Degrees    Right Wrist Flexion 80 Degrees      Strength   Right Hand Grip (lbs) 20    Right Hand Lateral Pinch 5 lbs    Right Hand  3 Point Pinch 3 lbs                    OT Treatments/Exercises (OP) - 09/18/20 0001      RUE Fluidotherapy   Number Minutes Fluidotherapy 8 Minutes    RUE Fluidotherapy Location Hand;Wrist    Comments AROM for wrist and digits in all planes             soreness decreasethis date and 4 hrs in CMC neoprene in am and pm Pt to try sleep without thumb spica - and wear only CMC neoprene during day   scar massage doneto palmar scar - using mini massager - and stretching into 3rd digit extention some adhesion improved since last time - pt to do scar massage and cont cica scar pad -   ContPROM for thumb IP and MC flexionand composite gentle PROMby OT and pt   Circular AROM for thumb - both ways   10 reps  Opposition to5th digit - keeping CMC out of palm- 5 reps each Wrist AAROMfor wrist flexion, ext and RD- over  edge of table- 10 reps pain free  1lbs or 16oz hammer for Wrist in  flexion , ext, RD, UD, sup ,pron pain free - review with pt - needed mod A to do correct this day - pt done 2 sets and can do at home  2 sets pain free   10 reps Rubber band  PA and RA of thumb- 12 reps pain free  2 sets this date pain free- pt to slow down  2 x day Light blue putty - grip , lat and 3 point grip -12 reps pain free -add to HEP - 1 set - 2 x day      OT Education - 09/18/20 1700    Education Details progress and HEP changes    Person(s) Educated Patient    Methods Explanation;Demonstration;Tactile cues;Verbal cues;Handout    Comprehension Verbal cues required;Returned demonstration;Verbalized understanding            OT Short Term Goals - 08/28/20 1739      OT SHORT TERM GOAL #1   Title Pt to be independent in HEP to initiate scar massage, increase AROM for wrist and thumb to wean out of thumb spica splint    Baseline Thumb spica most all the time- sterri strips still in place and decrease AROM in all planes for thumb and wrist    Time 3    Period  Weeks    Status New    Target Date 09/18/20             OT Long Term Goals - 08/28/20 1740      OT LONG TERM GOAL #1   Title Pt R wrist AROM and strength increase to  [facility] for pt able to be able to push up , push and pull door open    Baseline thumb spica most all the time- AROM initiated this date    Time 5    Period Weeks    Status New    Target Date 10/02/20      OT LONG TERM GOAL #2   Title R thumb PA and RA , flexion improve for pt to retrieve objects out of palm and grasp 1/2 full glass    Baseline PA and RA 40-45 degrees, flexion MC 20 ,IP 50 - pain -    Time 8    Period Weeks    Status New    Target Date 10/23/20      OT LONG TERM GOAL #3   Title R thumb strength increase for pt to push bottons and do fasteners    Baseline thumb spica on most all the time- AROM initiated this date -    Time 6    Period Weeks    Status New    Target Date 10/09/20      OT LONG TERM GOAL #4   Title Pt R grip and prehension increase to more than 60 % compare to L to turn car key, hold and pick up books , open cabinets /drawers    Baseline AROM initiated today- thumb spica splint most all the time    Time 8    Period Weeks    Status New    Target Date 10/23/20                 Plan - 09/18/20 1701    Clinical Impression Statement Pt is 8 1/2 wks s/p R thumb CMC arthroplasty and 3rd digit trigger finger release/dupuytrens release- Pt cont to show increase  AROM in wrist and thumb - pt to cont with PROM for composite flexion to thumb at [facility] and IP and AAROM edge of table for wrist flexion, ext, and RD- pain free - pt able to stabilze [facility] with opposition to 5th and initiated this date light putty for grip and prehension without pain- cont with rubber band for PA and RA to thumb with increase sets and reps  -cont to focus on palmar scar tissue   -cont to wean into CMC neoprene splint  now for whole day and can assess if can go without thumb spica at night time - increase reps and sets  for weight for wrist too - cont to increase functional use of R hand in ADL's and IADl's without increase pain    OT Occupational Profile and History Problem Focused Assessment - Including review of records relating to presenting problem    Occupational performance deficits (Please refer to evaluation for details): ADL's;IADL's;Work;Play;Leisure;Social Participation    Body Structure / Function / Physical Skills ADL;Coordination;FMC;Flexibility;Scar mobility;ROM;IADL;UE functional use;Pain;Dexterity    Rehab Potential Good    Clinical Decision Making Limited treatment options, no task modification necessary    Comorbidities Affecting Occupational Performance: None    Modification or Assistance to Complete Evaluation  No modification of tasks or assist necessary to complete eval    OT Frequency 2x / week    OT Duration 8 weeks    OT Treatment/Interventions Self-care/ADL training;Fluidtherapy;DME and/or AE instruction;Splinting;Therapeutic activities;Contrast Bath;Therapeutic exercise;Scar mobilization;Passive range of motion;Paraffin;Manual Therapy    Consulted and Agree with Plan of Care Patient           Patient will benefit from skilled therapeutic intervention in order to improve the following deficits and impairments:   Body Structure / Function / Physical Skills: ADL,Coordination,FMC,Flexibility,Scar mobility,ROM,IADL,UE functional use,Pain,Dexterity       Visit Diagnosis: Pain in right hand  Scar condition and fibrosis of skin  Muscle weakness (generalized)  Stiffness of right hand, not elsewhere classified  Stiffness of right wrist, not elsewhere classified    Problem List Patient Active Problem List   Diagnosis Date Noted  . Osteopenia of multiple sites 08/11/2018  . Near syncope 04/22/2018  . Personal history of other malignant neoplasm of skin 09/24/2017  . Osteoarthritis of carpometacarpal (CMC) joint of thumb 09/05/2017  . Costochondritis 12/26/2015  . Allergic  state 02/09/2015  . Adult hypothyroidism 02/09/2015  . OP (osteoporosis) 02/09/2015  . AV (anaerobic vaginosis) 11/18/2014  . Non-traumatic rotator cuff tear 09/29/2013    Rosalyn Gess OTR/L,CLT 09/18/2020, 5:55 PM  [place] [facility] [address] [place], [address] Phone: [phone]   Fax:  [phone]  Name: [name] MRN: [id number] Date of Birth: 02-Jul-1950

## 2020-09-21 ENCOUNTER — Other Ambulatory Visit: Payer: Self-pay

## 2020-09-21 ENCOUNTER — Ambulatory Visit: Payer: 59 | Admitting: Occupational Therapy

## 2020-09-21 DIAGNOSIS — M25631 Stiffness of right wrist, not elsewhere classified: Secondary | ICD-10-CM | POA: Diagnosis not present

## 2020-09-21 DIAGNOSIS — M25641 Stiffness of right hand, not elsewhere classified: Secondary | ICD-10-CM | POA: Diagnosis not present

## 2020-09-21 DIAGNOSIS — L905 Scar conditions and fibrosis of skin: Secondary | ICD-10-CM

## 2020-09-21 DIAGNOSIS — M79641 Pain in right hand: Secondary | ICD-10-CM | POA: Diagnosis not present

## 2020-09-21 DIAGNOSIS — M6281 Muscle weakness (generalized): Secondary | ICD-10-CM | POA: Diagnosis not present

## 2020-09-21 NOTE — Therapy (Signed)
[place] [facility] [address], [place], [address] Phone: [phone]   Fax:  [phone]  Occupational Therapy Treatment  Patient Details  Name: [name] MRN: [id number] Date of Birth: February 17, 1951 Referring Provider (OT): Shellia Cleverly PA   Encounter Date: 09/21/2020   OT End of Session - 09/21/20 1633    Visit Number 7    Number of Visits 12    Date for OT Re-Evaluation 10/09/20    OT Start Time 1448    OT Stop Time 1532    OT Time Calculation (min) 44 min    Activity Tolerance Patient tolerated treatment well    Behavior During Therapy [facility] for tasks assessed/performed           Past Medical History:  Diagnosis Date  . Basal cell carcinoma 12/08/2008   right sup medial breast  . Basal cell carcinoma 06/10/2017   left lat nasal bridge inf to medial canthus  . Basal cell carcinoma 03/02/2019   right distal med calf  . History of dysplastic nevus 05/02/2010   right sup pubic/mild    Past Surgical History:  Procedure Laterality Date  . KNEE SURGERY    . TOTAL VAGINAL HYSTERECTOMY      There were no vitals filed for this visit.   Subjective Assessment - 09/21/20 1450    Subjective  Doing good - wearing soft splint most of the day and night time nothing - done the putty - love that and then the hammer for weight- using imt more - no pain    Pertinent History Pt had CMC arthroplasty done 07/19/20 - was in cast and then 08/25/20 stitches was taken out and custom thumb spica was made- pt refer to start OT this date - had trigger finger released on 3rd digit    Patient Stated Goals Want to be able to use my R hand and thumb in every day activities - on computer and at home    Currently in Pain? No/denies              [facility] OT Assessment - 09/21/20 0001      Strength   Right Hand Grip (lbs) 29    Right Hand Lateral Pinch 7 lbs    Right Hand 3 Point Pinch 6 lbs    Left Hand Grip (lbs) 42     Left Hand Lateral Pinch 11 lbs    Left Hand 3 Point Pinch 11 lbs            Great progress in grip and prehension since previous visit- increase wrist strength in all planes         OT Treatments/Exercises (OP) - 09/21/20 0001      RUE Fluidotherapy   Number Minutes Fluidotherapy 8 Minutes    RUE Fluidotherapy Location Hand;Wrist    Comments AROM for wrist and thumb in all planes             Pt to start weaning out of CMC neoprene during day -and no wearing anything at night time   scar massage doneto palmar scar - using mini massager - and stretching into 3rd digit extention some adhesionimproved since last time- pt to do scar massage and cont cica scar pad - Done some soft tissue most webspace with PA and RA -and wrist flexion , extention stretches    ContPROM for thumb IP and MC flexionandcomposite gentle PROMby OT   Circular AROM for thumb -  both ways in fluido 10 reps  Opposition to5th digit - keeping CMC out of palm- 5 reps each Wrist AAROMfor wrist flexion, ext and RD- over edge of table- 10 reps pain free at home upgrade to 2 lbs for Wristinflexion , ext, RD, UD, sup ,pron pain free - review with pt - needed  Min A to do correct this day -pt can increase to 2nd set Sunday if no pain  12-15 reps  Rubber band PA and RA of thumb- 12 reps pain free  2 sets this date pain free- pt to slow down  2 x day And cont with Light blue putty - grip , lat and 3 point grip -12 reps pain free -2 sets of 12 x 2 x day can increase to 3rd set Sunday if no pain          OT Education - 09/21/20 1633    Education Details progress and HEP changes    Person(s) Educated Patient    Methods Explanation;Demonstration;Tactile cues;Verbal cues;Handout    Comprehension Verbal cues required;Returned demonstration;Verbalized understanding            OT Short Term Goals - 08/28/20 1739      OT SHORT TERM GOAL #1   Title Pt to be independent in HEP to  initiate scar massage, increase AROM for wrist and thumb to wean out of thumb spica splint    Baseline Thumb spica most all the time- sterri strips still in place and decrease AROM in all planes for thumb and wrist    Time 3    Period Weeks    Status New    Target Date 09/18/20             OT Long Term Goals - 08/28/20 1740      OT LONG TERM GOAL #1   Title Pt R wrist AROM and strength increase to  [facility] for pt able to be able to push up , push and pull door open    Baseline thumb spica most all the time- AROM initiated this date    Time 5    Period Weeks    Status New    Target Date 10/02/20      OT LONG TERM GOAL #2   Title R thumb PA and RA , flexion improve for pt to retrieve objects out of palm and grasp 1/2 full glass    Baseline PA and RA 40-45 degrees, flexion MC 20 ,IP 50 - pain -    Time 8    Period Weeks    Status New    Target Date 10/23/20      OT LONG TERM GOAL #3   Title R thumb strength increase for pt to push bottons and do fasteners    Baseline thumb spica on most all the time- AROM initiated this date -    Time 6    Period Weeks    Status New    Target Date 10/09/20      OT LONG TERM GOAL #4   Title Pt R grip and prehension increase to more than 60 % compare to L to turn car key, hold and pick up books , open cabinets /drawers    Baseline AROM initiated today- thumb spica splint most all the time    Time 8    Period Weeks    Status New    Target Date 10/23/20  Plan - 09/21/20 1634    Clinical Impression Statement Pt is 9 wks s/p R thumb CMC arthroplasty and 3rd digit trigger finger release/dupuytrens release- Pt cont to show increase AROM in wrist and thumb - pt to cont with PROM for composite flexion to thumb at [facility] and IP and AAROM edge of table for wrist flexion, ext, and RD- pain free - pt  strength improving greatly in thumb and wrist - albe to upgrade reps and sets for light putty for grip and prehension without pain- cont  with rubber band for PA and RA to thumb with increase sets and reps  -and upgrade to 2 lbs for wrist in all planes - can start weaning out of CMC  neoprene splint during day and increase activity pain free - cont to increase functional use of R hand in ADL's and IADl's without increase pain    OT Occupational Profile and History Problem Focused Assessment - Including review of records relating to presenting problem    Occupational performance deficits (Please refer to evaluation for details): ADL's;IADL's;Work;Play;Leisure;Social Participation    Body Structure / Function / Physical Skills ADL;Coordination;FMC;Flexibility;Scar mobility;ROM;IADL;UE functional use;Pain;Dexterity    Rehab Potential Good    Clinical Decision Making Limited treatment options, no task modification necessary    Comorbidities Affecting Occupational Performance: None    Modification or Assistance to Complete Evaluation  No modification of tasks or assist necessary to complete eval    OT Frequency 2x / week    OT Duration 8 weeks    OT Treatment/Interventions Self-care/ADL training;Fluidtherapy;DME and/or AE instruction;Splinting;Therapeutic activities;Contrast Bath;Therapeutic exercise;Scar mobilization;Passive range of motion;Paraffin;Manual Therapy    Consulted and Agree with Plan of Care Patient           Patient will benefit from skilled therapeutic intervention in order to improve the following deficits and impairments:   Body Structure / Function / Physical Skills: ADL,Coordination,FMC,Flexibility,Scar mobility,ROM,IADL,UE functional use,Pain,Dexterity       Visit Diagnosis: Pain in right hand  Scar condition and fibrosis of skin  Stiffness of right hand, not elsewhere classified  Muscle weakness (generalized)  Stiffness of right wrist, not elsewhere classified    Problem List Patient Active Problem List   Diagnosis Date Noted  . Osteopenia of multiple sites 08/11/2018  . Near syncope 04/22/2018   . Personal history of other malignant neoplasm of skin 09/24/2017  . Osteoarthritis of carpometacarpal (CMC) joint of thumb 09/05/2017  . Costochondritis 12/26/2015  . Allergic state 02/09/2015  . Adult hypothyroidism 02/09/2015  . OP (osteoporosis) 02/09/2015  . AV (anaerobic vaginosis) 11/18/2014  . Non-traumatic rotator cuff tear 09/29/2013    Rosalyn Gess OTR/L,CLT 09/21/2020, 4:37 PM  [place] [facility] [address] [place], [address] Phone: [phone]   Fax:  [phone]  Name: [name] MRN: [id number] Date of Birth: [birth year]

## 2020-09-22 DIAGNOSIS — M720 Palmar fascial fibromatosis [Dupuytren]: Secondary | ICD-10-CM | POA: Diagnosis not present

## 2020-09-22 DIAGNOSIS — M13841 Other specified arthritis, right hand: Secondary | ICD-10-CM | POA: Diagnosis not present

## 2020-09-22 DIAGNOSIS — Z4789 Encounter for other orthopedic aftercare: Secondary | ICD-10-CM | POA: Diagnosis not present

## 2020-09-25 ENCOUNTER — Other Ambulatory Visit: Payer: Self-pay

## 2020-09-25 ENCOUNTER — Ambulatory Visit: Payer: 59 | Admitting: Occupational Therapy

## 2020-09-25 DIAGNOSIS — M6281 Muscle weakness (generalized): Secondary | ICD-10-CM | POA: Diagnosis not present

## 2020-09-25 DIAGNOSIS — M25631 Stiffness of right wrist, not elsewhere classified: Secondary | ICD-10-CM | POA: Diagnosis not present

## 2020-09-25 DIAGNOSIS — L905 Scar conditions and fibrosis of skin: Secondary | ICD-10-CM

## 2020-09-25 DIAGNOSIS — M25641 Stiffness of right hand, not elsewhere classified: Secondary | ICD-10-CM

## 2020-09-25 DIAGNOSIS — M79641 Pain in right hand: Secondary | ICD-10-CM

## 2020-09-25 NOTE — Therapy (Signed)
[place] [facility] [address] [place], [address] Phone: [phone]   Fax:  [phone]  Occupational Therapy Treatment  Patient Details  Name: [name] MRN: [id number] Date of Birth: Jan 23, 1951 Referring Provider (OT): Shellia Cleverly PA   Encounter Date: 09/25/2020   OT End of Session - 09/25/20 1826    Visit Number 8    Number of Visits 12    Date for OT Re-Evaluation 10/09/20    OT Start Time 1962    OT Stop Time 1556    OT Time Calculation (min) 41 min    Activity Tolerance Patient tolerated treatment well    Behavior During Therapy [facility] for tasks assessed/performed           Past Medical History:  Diagnosis Date  . Basal cell carcinoma 12/08/2008   right sup medial breast  . Basal cell carcinoma 06/10/2017   left lat nasal bridge inf to medial canthus  . Basal cell carcinoma 03/02/2019   right distal med calf  . History of dysplastic nevus 05/02/2010   right sup pubic/mild    Past Surgical History:  Procedure Laterality Date  . KNEE SURGERY    . TOTAL VAGINAL HYSTERECTOMY      There were no vitals filed for this visit.   Subjective Assessment - 09/25/20 1824    Subjective  Seen the PA and she was very happy with my progress - I am using my hand and thumb in everything - one problem - this scar tissue still now down in my palm - no wearing my soft splint during day - still night time    Pertinent History Pt had CMC arthroplasty done 07/19/20 - was in cast and then 08/25/20 stitches was taken out and custom thumb spica was made- pt refer to start OT this date - had trigger finger released on 3rd digit    Patient Stated Goals Want to be able to use my R hand and thumb in every day activities - on computer and at home    Currently in Pain? No/denies              [facility] OT Assessment - 09/25/20 0001      Strength   Right Hand Grip (lbs) 30    Right Hand Lateral Pinch 7 lbs    Right  Hand 3 Point Pinch 6 lbs    Left Hand Grip (lbs) 42    Left Hand Lateral Pinch 11 lbs    Left Hand 3 Point Pinch 11 lbs                    OT Treatments/Exercises (OP) - 09/25/20 0001      RUE Paraffin   Number Minutes Paraffin 8 Minutes    RUE Paraffin Location Hand    Comments prior to soft tissue and scar massage             Pt not wearing CMC neoprene splint anymore day time - only night time but to wean out  scar massage doneto palmar scar - using mini massager - and stretching into 3rd digit extentionwith table slides  some adhesionimproved since last timebut more proximal now - pt to do scar massage and cont cica scar pad - Done some soft tissue most webspace with PA and RA -and wrist flexion , extention stretches    ContPROM for thumb IP and MC flexionandcomposite gentle PROMby OT   Circular AROM  for thumb - both ways in fluido 10 reps  Opposition to5th digit - keeping CMC out of palm- 5 reps each Wrist AAROMfor wrist flexion, ext and RD- over edge of table- 10 reps pain free at home cont 2 lbs for Wristinflexion , ext, RD, UD, sup ,pron pain free- 3 sets  12-15 reps  Rubber band PA and RA of thumb- 12reps pain free  2 sets this date pain free- pt to slow down 2 x day And upgrade putty to teal medium  - grip , lat and 3 point grip -12 reps pain free -2 sets of 12 x 2 x day        OT Education - 09/25/20 1826    Education Details progress and HEP changes    Person(s) Educated Patient    Methods Explanation;Demonstration;Tactile cues;Verbal cues;Handout    Comprehension Verbal cues required;Returned demonstration;Verbalized understanding            OT Short Term Goals - 09/25/20 1829      OT SHORT TERM GOAL #1   Title Pt to be independent in HEP to initiate scar massage, increase AROM for wrist and thumb to wean out of thumb spica splint    Status Achieved             OT Long Term Goals - 09/25/20 1829       OT LONG TERM GOAL #1   Title Pt R wrist AROM and strength increase to  [facility] for pt able to be able to push up , push and pull door open    Status Achieved      OT LONG TERM GOAL #2   Title R thumb PA and RA , flexion improve for pt to retrieve objects out of palm and grasp 1/2 full glass    Status Achieved      OT LONG TERM GOAL #3   Title R thumb strength increase for pt to push bottons and do fasteners    Status Achieved      OT LONG TERM GOAL #4   Title Pt R grip and prehension increase to more than 60 % compare to L to turn car key, hold and pick up books , open cabinets /drawers    Baseline AROM initiated today- thumb spica splint most all the time  NOW increase grip and prehension - but still decrease - see flowsheet    Time 4    Period Weeks    Status On-going    Target Date 10/23/20                 Plan - 09/25/20 1827    Clinical Impression Statement Pt is 91/2 wks s/p R thumb CMC arthroplasty and 3rd digit trigger finger release/dupuytrens release- Pt cont to show increase AROM in wrist and thumb - pt to cont with PROM for composite flexion to thumb at [facility] and IP and AAROM edge of table for wrist flexion, ext, and RD- pain free - pt  strength improving greatly in thumb and wrist - albe to upgrade  to medium teal putty for gip and prehension without pain- cont with rubber band for PA and RA to thumb with increase sets and reps  -cont 2 lbs for wrist in all planes- pt did not wear CMC  neoprene splint during day and to stop wearing night time -  pt to increase use of R hand in all ADL's and IADL's - pain free- decrease to 1 x wk  OT Occupational Profile and History Problem Focused Assessment - Including review of records relating to presenting problem    Occupational performance deficits (Please refer to evaluation for details): ADL's;IADL's;Work;Play;Leisure;Social Participation    Body Structure / Function / Physical Skills ADL;Coordination;FMC;Flexibility;Scar  mobility;ROM;IADL;UE functional use;Pain;Dexterity    Rehab Potential Good    Clinical Decision Making Limited treatment options, no task modification necessary    Comorbidities Affecting Occupational Performance: None    Modification or Assistance to Complete Evaluation  No modification of tasks or assist necessary to complete eval    OT Frequency 1x / week    OT Duration 4 weeks    OT Treatment/Interventions Self-care/ADL training;Fluidtherapy;DME and/or AE instruction;Splinting;Therapeutic activities;Contrast Bath;Therapeutic exercise;Scar mobilization;Passive range of motion;Paraffin;Manual Therapy    Consulted and Agree with Plan of Care Patient           Patient will benefit from skilled therapeutic intervention in order to improve the following deficits and impairments:   Body Structure / Function / Physical Skills: ADL,Coordination,FMC,Flexibility,Scar mobility,ROM,IADL,UE functional use,Pain,Dexterity       Visit Diagnosis: Pain in right hand  Scar condition and fibrosis of skin  Stiffness of right hand, not elsewhere classified  Muscle weakness (generalized)  Stiffness of right wrist, not elsewhere classified    Problem List Patient Active Problem List   Diagnosis Date Noted  . Osteopenia of multiple sites 08/11/2018  . Near syncope 04/22/2018  . Personal history of other malignant neoplasm of skin 09/24/2017  . Osteoarthritis of carpometacarpal (CMC) joint of thumb 09/05/2017  . Costochondritis 12/26/2015  . Allergic state 02/09/2015  . Adult hypothyroidism 02/09/2015  . OP (osteoporosis) 02/09/2015  . AV (anaerobic vaginosis) 11/18/2014  . Non-traumatic rotator cuff tear 09/29/2013    Rosalyn Gess OTR/L,CLT 09/25/2020, 6:32 PM  [place] [facility] [address] [place], [address] Phone: [phone]   Fax:  [phone]  Name: [name] MRN: [id number] Date of Birth: 05/12/1950

## 2020-09-28 ENCOUNTER — Ambulatory Visit: Payer: 59 | Admitting: Occupational Therapy

## 2020-10-03 ENCOUNTER — Ambulatory Visit: Payer: 59 | Admitting: Occupational Therapy

## 2020-10-03 DIAGNOSIS — M13841 Other specified arthritis, right hand: Secondary | ICD-10-CM | POA: Diagnosis not present

## 2020-10-03 DIAGNOSIS — Z4789 Encounter for other orthopedic aftercare: Secondary | ICD-10-CM | POA: Diagnosis not present

## 2020-10-09 ENCOUNTER — Ambulatory Visit: Payer: 59 | Admitting: Occupational Therapy

## 2020-10-16 ENCOUNTER — Ambulatory Visit: Payer: 59 | Attending: Physician Assistant | Admitting: Occupational Therapy

## 2020-10-16 ENCOUNTER — Ambulatory Visit: Payer: 59 | Admitting: Occupational Therapy

## 2020-10-16 ENCOUNTER — Other Ambulatory Visit: Payer: Self-pay

## 2020-10-16 DIAGNOSIS — M25641 Stiffness of right hand, not elsewhere classified: Secondary | ICD-10-CM | POA: Diagnosis not present

## 2020-10-16 DIAGNOSIS — M25631 Stiffness of right wrist, not elsewhere classified: Secondary | ICD-10-CM | POA: Insufficient documentation

## 2020-10-16 DIAGNOSIS — M79641 Pain in right hand: Secondary | ICD-10-CM | POA: Diagnosis not present

## 2020-10-16 DIAGNOSIS — L905 Scar conditions and fibrosis of skin: Secondary | ICD-10-CM | POA: Diagnosis not present

## 2020-10-16 DIAGNOSIS — M6281 Muscle weakness (generalized): Secondary | ICD-10-CM | POA: Insufficient documentation

## 2020-10-16 NOTE — Therapy (Signed)
[place] [facility] [address] [place], [address] Phone: [phone]   Fax:  [phone]  Occupational Therapy Treatment  Patient Details  Name: [name] MRN: [id number] Date of Birth: [birth year] Referring Provider (OT): Shellia Cleverly PA   Encounter Date: 10/16/2020   OT End of Session - 10/16/20 1721     Visit Number 9    Number of Visits 12    Date for OT Re-Evaluation 10/09/20    OT Start Time 1601    OT Stop Time 1632    OT Time Calculation (min) 31 min    Activity Tolerance Patient tolerated treatment well    Behavior During Therapy [facility] for tasks assessed/performed             Past Medical History:  Diagnosis Date   Basal cell carcinoma 12/08/2008   right sup medial breast   Basal cell carcinoma 06/10/2017   left lat nasal bridge inf to medial canthus   Basal cell carcinoma 03/02/2019   right distal med calf   History of dysplastic nevus 05/02/2010   right sup pubic/mild    Past Surgical History:  Procedure Laterality Date   KNEE SURGERY     TOTAL VAGINAL HYSTERECTOMY      There were no vitals filed for this visit.   Subjective Assessment - 10/16/20 1719     Subjective  Doing better- some soreness on the joint - here where the scar is -but about 1/10 - and otherwise doing good- not doing any putty -and using it on computer the while day    Pertinent History Pt had CMC arthroplasty done 07/19/20 - was in cast and then 08/25/20 stitches was taken out and custom thumb spica was made- pt refer to start OT this date - had trigger finger released on 3rd digit    Patient Stated Goals Want to be able to use my R hand and thumb in every day activities - on computer and at home    Currently in Pain? Yes    Pain Score 1     Pain Location Hand    Pain Orientation Right    Pain Descriptors / Indicators Sore    Pain Type Surgical pain    Pain Onset More than a month ago    Pain Frequency  Intermittent                [facility] OT Assessment - 10/16/20 0001       Strength   Right Hand Grip (lbs) 25    Right Hand Lateral Pinch 7 lbs    Right Hand 3 Point Pinch 5 lbs    Left Hand Grip (lbs) 42    Left Hand Lateral Pinch 11 lbs    Left Hand 3 Point Pinch 11 lbs      Right Hand AROM   R Thumb MCP 0-60 35 Degrees    R Thumb IP 0-80 65 Degrees    R Thumb Radial ABduction/ADduction 0-55 45    R Thumb Palmar ABduction/ADduction 0-45 52    R Thumb Opposition to Index --   Opposition to 5th             Assess AROM and grip/prehension - pt had increase pain after last time - 3 wks ago  Held off on her HEP -ROM and strengthening and doing well She is tender still over [place] and scar -but 1/10 No pain with AROM of thumb PA ,  RA and grip /prehension- did decrease 5 lbs in grip and 3 point 1 lbs  But pt about same AROM - did recommend pt getting wider mouse - because of cupping and using the whole day Preventing hyper thenar ADD into palm and decrease PA and RA  Pt ed on doing taping over scar for scar mobs - during day         OT Treatments/Exercises (OP) - 10/16/20 0001       RUE Fluidotherapy   Number Minutes Fluidotherapy 8 Minutes    RUE Fluidotherapy Location Hand;Wrist    Comments AROM for thumb and wrist in all planes             after heat doing some PA and RA - but not over doing - follow up one more time prior to surgeon appt next week        OT Education - 10/16/20 1720     Education Details progress and HEP changes    Person(s) Educated Patient    Methods Explanation;Demonstration;Tactile cues;Verbal cues;Handout    Comprehension Verbal cues required;Returned demonstration;Verbalized understanding              OT Short Term Goals - 09/25/20 1829       OT SHORT TERM GOAL #1   Title Pt to be independent in HEP to initiate scar massage, increase AROM for wrist and thumb to wean out of thumb spica splint    Status Achieved                OT Long Term Goals - 09/25/20 1829       OT LONG TERM GOAL #1   Title Pt R wrist AROM and strength increase to  [facility] for pt able to be able to push up , push and pull door open    Status Achieved      OT LONG TERM GOAL #2   Title R thumb PA and RA , flexion improve for pt to retrieve objects out of palm and grasp 1/2 full glass    Status Achieved      OT LONG TERM GOAL #3   Title R thumb strength increase for pt to push bottons and do fasteners    Status Achieved      OT LONG TERM GOAL #4   Title Pt R grip and prehension increase to more than 60 % compare to L to turn car key, hold and pick up books , open cabinets /drawers    Baseline AROM initiated today- thumb spica splint most all the time  NOW increase grip and prehension - but still decrease - see flowsheet    Time 4    Period Weeks    Status On-going    Target Date 10/23/20                   Plan - 10/16/20 1721     Clinical Impression Statement Pt is about 12 wks s/p R thumb CMC arthroplasty and 3rd digit trigger finger release/dupuytrens release-  Pt not seen for 3 wks- had increase pain after last time -but held off on HEP and report only 1/10 soreness at scar on dorsal thumb - but AROM in thumband wrist WFL - and grip decrease 5 lbs to 25 lbs ,and Lat and 3 point decrease to about 50% compare to L hand - pt use computer the whole day -recommend looking into wider mouse for hand - and cont after heat for AROM for  thumb PA and RA - can cont scar massage or kinesiotape to scar this date - to use during day - will check one more time on pt prior to return to surgeon next week.    OT Occupational Profile and History Problem Focused Assessment - Including review of records relating to presenting problem    Occupational performance deficits (Please refer to evaluation for details): ADL's;IADL's;Work;Play;Leisure;Social Participation    Body Structure / Function / Physical Skills  ADL;Coordination;FMC;Flexibility;Scar mobility;ROM;IADL;UE functional use;Pain;Dexterity    Rehab Potential Good    Clinical Decision Making Limited treatment options, no task modification necessary    Comorbidities Affecting Occupational Performance: None    Modification or Assistance to Complete Evaluation  No modification of tasks or assist necessary to complete eval    OT Frequency 1x / week    OT Duration 4 weeks    OT Treatment/Interventions Self-care/ADL training;Fluidtherapy;DME and/or AE instruction;Splinting;Therapeutic activities;Contrast Bath;Therapeutic exercise;Scar mobilization;Passive range of motion;Paraffin;Manual Therapy    Consulted and Agree with Plan of Care Patient             Patient will benefit from skilled therapeutic intervention in order to improve the following deficits and impairments:   Body Structure / Function / Physical Skills: ADL, Coordination, FMC, Flexibility, Scar mobility, ROM, IADL, UE functional use, Pain, Dexterity       Visit Diagnosis: Pain in right hand  Scar condition and fibrosis of skin  Stiffness of right hand, not elsewhere classified  Muscle weakness (generalized)  Stiffness of right wrist, not elsewhere classified    Problem List Patient Active Problem List   Diagnosis Date Noted   Osteopenia of multiple sites 08/11/2018   Near syncope 04/22/2018   Personal history of other malignant neoplasm of skin 09/24/2017   Osteoarthritis of carpometacarpal (CMC) joint of thumb 09/05/2017   Costochondritis 12/26/2015   Allergic state 02/09/2015   Adult hypothyroidism 02/09/2015   OP (osteoporosis) 02/09/2015   AV (anaerobic vaginosis) 11/18/2014   Non-traumatic rotator cuff tear 09/29/2013    Rosalyn Gess OTR/L,CLT 10/16/2020, 5:28 PM  [place] [facility] [address] [place], [address] Phone: [phone]   Fax:  [phone]  Name: [name] MRN: [id number] Date of Birth: [birth year]

## 2020-10-23 ENCOUNTER — Ambulatory Visit: Payer: 59 | Admitting: Occupational Therapy

## 2020-10-23 ENCOUNTER — Other Ambulatory Visit: Payer: Self-pay

## 2020-10-23 DIAGNOSIS — M25641 Stiffness of right hand, not elsewhere classified: Secondary | ICD-10-CM | POA: Diagnosis not present

## 2020-10-23 DIAGNOSIS — M25631 Stiffness of right wrist, not elsewhere classified: Secondary | ICD-10-CM

## 2020-10-23 DIAGNOSIS — M79641 Pain in right hand: Secondary | ICD-10-CM

## 2020-10-23 DIAGNOSIS — L905 Scar conditions and fibrosis of skin: Secondary | ICD-10-CM | POA: Diagnosis not present

## 2020-10-23 DIAGNOSIS — M6281 Muscle weakness (generalized): Secondary | ICD-10-CM

## 2020-10-23 NOTE — Therapy (Signed)
[place] [facility] [address] [place], [place], [address] Phone: [phone]   Fax:  [phone]  Occupational Therapy Treatment/discharge  Patient Details  Name: [name] MRN: [id number] Date of Birth: [birth year] Referring Provider (OT): Shellia Cleverly PA   Encounter Date: 10/23/2020   OT End of Session - 10/23/20 1550     Visit Number 10    Number of Visits 10    Date for OT Re-Evaluation 10/23/20    OT Start Time 1515    OT Stop Time 1540    OT Time Calculation (min) 25 min    Activity Tolerance Patient tolerated treatment well    Behavior During Therapy [facility] for tasks assessed/performed             Past Medical History:  Diagnosis Date   Basal cell carcinoma 12/08/2008   right sup medial breast   Basal cell carcinoma 06/10/2017   left lat nasal bridge inf to medial canthus   Basal cell carcinoma 03/02/2019   right distal med calf   History of dysplastic nevus 05/02/2010   right sup pubic/mild    Past Surgical History:  Procedure Laterality Date   KNEE SURGERY     TOTAL VAGINAL HYSTERECTOMY      There were no vitals filed for this visit.   Subjective Assessment - 10/23/20 1548     Subjective  Doing better- no pain - only tired by the end of the day working - did get me different mouse to work - how long do I need to do the scar massage    Pertinent History Pt had [place] arthroplasty done 07/19/20 - was in cast and then 08/25/20 stitches was taken out and custom thumb spica was made- pt refer to start OT this date - had trigger finger released on 3rd digit    Patient Stated Goals Want to be able to use my R hand and thumb in every day activities - on computer and at home    Currently in Pain? No/denies                [facility] OT Assessment - 10/23/20 0001       Strength   Right Hand Grip (lbs) 31    Right Hand Lateral Pinch 8 lbs    Right Hand 3 Point Pinch 6 lbs    Left Hand Grip (lbs)  42      Right Hand AROM   R Thumb MCP 0-60 35 Degrees    R Thumb IP 0-80 65 Degrees    R Thumb Radial ABduction/ADduction 0-55 45    R Thumb Palmar ABduction/ADduction 0-45 52    R Thumb Opposition to Index --   Opposition to 5th - maintain CMC out of palm            Assess AROM and grip/prehension - WFL and no pain   See flow sheet   No pain with AROM of thumb PA , RA and grip /prehension strength But pt about same AROM - did recommend pt getting wider mouse - per pt she did get one- no pain at end of day - just tired feeling  Pt to cont with HEP in shower daily for wrist AROM in all planes - Thumb PA and RA  Cont functional strength- she is very active        OT Treatments/Exercises (OP) - 10/23/20 0001       RUE Fluidotherapy   Number  Minutes Fluidotherapy 8 Minutes    RUE Fluidotherapy Location Hand;Wrist    Comments AROM for hand and wrist in all planes                    OT Education - 10/23/20 1550     Education Details progress and discharge instructions    Person(s) Educated Patient    Methods Explanation;Demonstration;Tactile cues;Verbal cues;Handout    Comprehension Verbal cues required;Returned demonstration;Verbalized understanding              OT Short Term Goals - 09/25/20 1829       OT SHORT TERM GOAL #1   Title Pt to be independent in HEP to initiate scar massage, increase AROM for wrist and thumb to wean out of thumb spica splint    Status Achieved               OT Long Term Goals - 10/23/20 1554       OT LONG TERM GOAL #1   Title Pt R wrist AROM and strength increase to  [facility] for pt able to be able to push up , push and pull door open    Status Achieved      OT LONG TERM GOAL #2   Status Achieved      OT LONG TERM GOAL #3   Title R thumb strength increase for pt to push bottons and do fasteners    Status Achieved      OT LONG TERM GOAL #4   Title Pt R grip and prehension increase to more than 60 % compare to L  to turn car key, hold and pick up books , open cabinets /drawers    Baseline Grip and prehension increase and more than 60% compare to R [facility] with no pain    Status Achieved                   Plan - 10/23/20 1551     Clinical Impression Statement Pt is about 13 wks s/p R thumb CMC arthroplasty and 3rd digit trigger finger release/dupuytrens release-   Pt made great progress in THumb and wrist AROM and strrength- scar tissue improve greatly in palm and thumb CMC - pt to do scar massage and scar pad as needed- no pain wth use at work or home -only some fatigue - with little soreness- pt to cont to do wrist and thumb AROM in shower if stiff -and cont just functional strength in ADL's and IADL's- discharge at this time with HEP    OT Occupational Profile and History Problem Focused Assessment - Including review of records relating to presenting problem    Occupational performance deficits (Please refer to evaluation for details): ADL's;IADL's;Work;Play;Leisure;Social Participation    Body Structure / Function / Physical Skills ADL;Coordination;FMC;Flexibility;Scar mobility;ROM;IADL;UE functional use;Pain;Dexterity    Rehab Potential Good    Clinical Decision Making Limited treatment options, no task modification necessary    Comorbidities Affecting Occupational Performance: None    Modification or Assistance to Complete Evaluation  No modification of tasks or assist necessary to complete eval    OT Treatment/Interventions Self-care/ADL training;Fluidtherapy;DME and/or AE instruction;Splinting;Therapeutic activities;Contrast Bath;Therapeutic exercise;Scar mobilization;Passive range of motion;Paraffin;Manual Therapy    Consulted and Agree with Plan of Care Patient             Patient will benefit from skilled therapeutic intervention in order to improve the following deficits and impairments:   Body Structure / Function / Physical Skills: ADL,  Coordination, [place], Flexibility, Scar  mobility, ROM, IADL, UE functional use, Pain, Dexterity       Visit Diagnosis: Pain in right hand  Scar condition and fibrosis of skin  Stiffness of right hand, not elsewhere classified  Muscle weakness (generalized)  Stiffness of right wrist, not elsewhere classified    Problem List Patient Active Problem List   Diagnosis Date Noted   Osteopenia of multiple sites 08/11/2018   Near syncope 04/22/2018   Personal history of other malignant neoplasm of skin 09/24/2017   Osteoarthritis of carpometacarpal (CMC) joint of thumb 09/05/2017   Costochondritis 12/26/2015   Allergic state 02/09/2015   Adult hypothyroidism 02/09/2015   OP (osteoporosis) 02/09/2015   AV (anaerobic vaginosis) 11/18/2014   Non-traumatic rotator cuff tear 09/29/2013    Idus Rathke, Gwenette Greet 10/23/2020, 3:56 PM  [place] [facility] [address] [place], [address] Phone: [phone]   Fax:  [phone]  Name: [name] MRN: [id number] Date of Birth: 1950-06-23

## 2020-10-24 DIAGNOSIS — M13841 Other specified arthritis, right hand: Secondary | ICD-10-CM | POA: Diagnosis not present

## 2020-10-24 DIAGNOSIS — M720 Palmar fascial fibromatosis [Dupuytren]: Secondary | ICD-10-CM | POA: Diagnosis not present

## 2020-11-04 DIAGNOSIS — Z03818 Encounter for observation for suspected exposure to other biological agents ruled out: Secondary | ICD-10-CM | POA: Diagnosis not present

## 2020-11-04 DIAGNOSIS — J069 Acute upper respiratory infection, unspecified: Secondary | ICD-10-CM | POA: Diagnosis not present

## 2020-11-04 DIAGNOSIS — Z20822 Contact with and (suspected) exposure to covid-19: Secondary | ICD-10-CM | POA: Diagnosis not present

## 2020-11-08 ENCOUNTER — Other Ambulatory Visit: Payer: Self-pay

## 2020-11-08 DIAGNOSIS — J019 Acute sinusitis, unspecified: Secondary | ICD-10-CM | POA: Diagnosis not present

## 2020-11-08 MED ORDER — METHYLPREDNISOLONE 4 MG PO TBPK
ORAL_TABLET | ORAL | 0 refills | Status: DC
  Filled 2020-11-08: qty 21, 6d supply, fill #0

## 2020-11-08 MED ORDER — AMOXICILLIN-POT CLAVULANATE 875-125 MG PO TABS
ORAL_TABLET | Freq: Two times a day (BID) | ORAL | 0 refills | Status: DC
  Filled 2020-11-08: qty 28, 14d supply, fill #0

## 2020-11-08 MED ORDER — FLUCONAZOLE 200 MG PO TABS
ORAL_TABLET | ORAL | 0 refills | Status: DC
  Filled 2020-11-08: qty 10, 20d supply, fill #0

## 2020-11-22 ENCOUNTER — Other Ambulatory Visit: Payer: Self-pay

## 2020-11-22 MED ORDER — CARESTART COVID-19 HOME TEST VI KIT
PACK | 0 refills | Status: DC
  Filled 2020-11-22: qty 2, 4d supply, fill #0

## 2020-11-22 MED FILL — Albuterol Sulfate Inhal Aero 108 MCG/ACT (90MCG Base Equiv): RESPIRATORY_TRACT | 30 days supply | Qty: 18 | Fill #0 | Status: AC

## 2020-12-05 ENCOUNTER — Other Ambulatory Visit: Payer: Self-pay

## 2020-12-05 MED ORDER — LEVOTHYROXINE SODIUM 50 MCG PO TABS
ORAL_TABLET | ORAL | 1 refills | Status: DC
  Filled 2020-12-05: qty 90, 90d supply, fill #0
  Filled 2021-03-07: qty 90, 90d supply, fill #1

## 2020-12-06 ENCOUNTER — Other Ambulatory Visit: Payer: Self-pay

## 2020-12-11 ENCOUNTER — Other Ambulatory Visit: Payer: Self-pay

## 2020-12-18 ENCOUNTER — Other Ambulatory Visit: Payer: Self-pay

## 2020-12-19 ENCOUNTER — Other Ambulatory Visit: Payer: Self-pay

## 2020-12-19 ENCOUNTER — Ambulatory Visit
Admission: EM | Admit: 2020-12-19 | Discharge: 2020-12-19 | Disposition: A | Discharge status: Home / Self Care | Payer: 59 | Attending: Internal Medicine | Admitting: Internal Medicine

## 2020-12-19 ENCOUNTER — Inpatient Hospital Stay: Admission: RE | Admit: 2020-12-19 | Payer: Self-pay | Source: Ambulatory Visit

## 2020-12-19 DIAGNOSIS — H6982 Other specified disorders of Eustachian tube, left ear: Secondary | ICD-10-CM

## 2020-12-19 DIAGNOSIS — R519 Headache, unspecified: Secondary | ICD-10-CM

## 2020-12-19 MED ORDER — PREDNISONE 20 MG PO TABS
ORAL_TABLET | ORAL | 0 refills | Status: DC
  Filled 2020-12-19: qty 5, 5d supply, fill #0

## 2020-12-19 NOTE — ED Provider Notes (Signed)
[name]    CSN: [id number] Arrival date & time: 12/19/20  1314      History   Chief Complaint Chief Complaint  Patient presents with   Otalgia    HPI [name] is a 70 y.o. female who presents with L ear pain and sinus pressure x 4 days. She was exposed to perfume that day, and is not getting better. She tried to see her ENT but they cant see her for 2 months. He usually places her on prednisone and Augmentin. She denies having a fever. Has pressure on L maxillary sinus.     Past Medical History:  Diagnosis Date   Basal cell carcinoma 12/08/2008   right sup medial breast   Basal cell carcinoma 06/10/2017   left lat nasal bridge inf to medial canthus   Basal cell carcinoma 03/02/2019   right distal med calf   History of dysplastic nevus 05/02/2010   right sup pubic/mild    Patient Active Problem List   Diagnosis Date Noted   Osteopenia of multiple sites 08/11/2018   Near syncope 04/22/2018   Personal history of other malignant neoplasm of skin 09/24/2017   Osteoarthritis of carpometacarpal ([place]) joint of thumb 09/05/2017   Costochondritis 12/26/2015   Allergic state 02/09/2015   Adult hypothyroidism 02/09/2015   OP (osteoporosis) 02/09/2015   AV (anaerobic vaginosis) 11/18/2014   Non-traumatic rotator cuff tear 09/29/2013    Past Surgical History:  Procedure Laterality Date   KNEE SURGERY     TOTAL VAGINAL HYSTERECTOMY      OB History   No obstetric history on file.      Home Medications    Prior to Admission medications   Medication Sig Start Date End Date Taking? Authorizing Provider  predniSONE (DELTASONE) 20 MG tablet take by mouth daily for 5 days 12/19/20  Yes Rodriguez-Southworth, Sunday Spillers, PA-C  albuterol (PROVENTIL HFA;VENTOLIN HFA) 108 (90 Base) MCG/ACT inhaler Inhale 2 puffs into the lungs every 4 (four) hours as needed for wheezing or shortness of breath (cough, shortness of breath or wheezing.). 01/19/18   Scot Jun,  FNP  albuterol (VENTOLIN HFA) 108 (90 Base) MCG/ACT inhaler Inhale 2 puffs into the lungs every 4 (four) hours as needed for wheezing or shortness of breath. 06/24/19   Sharion Balloon, NP  albuterol (VENTOLIN HFA) 108 (90 Base) MCG/ACT inhaler INHALE 2 PUFFS INTO THE LUNGS EVERY 6 TO 8 HOURS AS NEEDED FOR COUGH/WHEEZING 04/19/20 04/19/21  Beverly Gust, MD  calcium carbonate (OS-CAL) 1250 (500 Ca) MG chewable tablet Chew 1 tablet by mouth daily.    [provider]  cetirizine (ZYRTEC) 10 MG chewable tablet Chew 10 mg by mouth daily.    [provider]  cholecalciferol (VITAMIN D) 1000 units tablet Take 1,000 Units by mouth daily.    [provider]  COVID-19 At Home Antigen Test [facility] COVID-19 HOME TEST) KIT Use as directed 11/22/20   Letta Median, RPH  COVID-19 mRNA Vac-TriS, Pfizer, (PFIZER-BIONT COVID-19 VAC-TRIS) SUSP injection Inject into the muscle. 08/10/20   Carlyle Basques, MD  fexofenadine (ALLEGRA) 30 MG tablet Take 30 mg by mouth daily.     [provider]  fluconazole (DIFLUCAN) 150 MG tablet TAKE 1 TABLET BY MOUTH NOW, THEN REPEAT IF NEEDED 03/27/20 03/27/21  Beverly Gust, MD  fluconazole (DIFLUCAN) 200 MG tablet take 1 pill every other day 11/08/20     fluticasone (FLONASE) 50 MCG/ACT nasal spray Place 1 spray into both nostrils daily. 06/03/18  [provider]  fluticasone (FLONASE) 50 MCG/ACT nasal spray USE TWO SPRAYS IN [facility] NOSTRIL DAILY 06/02/20 06/02/21  Beverly Gust, MD  HYDROcodone-acetaminophen (NORCO) 10-325 MG tablet TAKE 1 TABLET BY MOUTH EVERY 6 HOURS AS NEEDED FOR 5 DAYS. 07/26/20 01/22/21  Brynda Peon, PA  ibuprofen (ADVIL) 800 MG tablet Take 1 tablet (800 mg total) by mouth 3 (three) times daily. 12/06/18   Danton Clap, PA-C  levocetirizine (XYZAL) 5 MG tablet Take 1 tablet (5 mg total) by mouth every evening. 08/31/19   Sharion Balloon, NP  levofloxacin (LEVAQUIN) 750 MG tablet TAKE ONE TABLET BY MOUTH  DAILY 04/19/20 04/19/21  Beverly Gust, MD  levothyroxine (SYNTHROID) 50 MCG tablet TAKE 1 TABLET BY MOUTH ONCE DAILY ON AN EMPTY STOMACH WITH A GLASS OF WATER AT LEAST 30-60 MINUTES BEFORE BREAKFAST. 06/02/20 06/02/21  Tama High III, MD  levothyroxine (SYNTHROID) 50 MCG tablet TAKE 1 TABLET BY MOUTH ONCE DAILY ON AN EMPTY STOMACH WITH A GLASS OF WATER AT LEAST 30-60 MINUTES BEFORE BREAKFAST. Patient taking differently: Take by mouth daily before breakfast. ON AN EMPTY STOMACH WITH A GLASS OF WATER AT LEAST 30-60 MINUTES BEFORE BREAKFAST. 12/07/19 12/06/20  Tama High III, MD  levothyroxine (SYNTHROID) 50 MCG tablet Take 1 tablet (50 mcg total) by mouth every morning before breakfast (0630) ON AN EMPTY STOMACH WITH A GLASS OF WATER AT LEAST 30-60 MINUTES BEFORE BREAKFAST 12/05/20     meloxicam (MOBIC) 7.5 MG tablet TAKE 1 TABLET BY MOUTH TWICE DAILY AFTER MEALS 05/16/20 05/16/21  Hooten, Laurice Record, MD  methylPREDNISolone (MEDROL) 4 MG TBPK tablet Take as directed 11/08/20     montelukast (SINGULAIR) 10 MG tablet Take 1 tablet (10 mg total) by mouth daily for 30 days. 09/18/18 10/18/18  [name], PA-C  montelukast (SINGULAIR) 10 MG tablet TAKE 1 TABLET BY MOUTH NIGHTLY 09/07/20     Multiple Vitamin (MULTI-VITAMINS) TABS Take by mouth.    [provider]  mupirocin cream (BACTROBAN) 2 % Apply 1 application topically 2 (two) times daily. 09/18/18   [name], PA-C  valACYclovir (VALTREX) 1000 MG tablet Take 1 tablet (1,000 mg total) by mouth 3 (three) times daily. 12/06/18   Danton Clap, PA-C    Family History Family History  Problem Relation Age of Onset   Lung cancer Father    Breast cancer Neg Hx     Social History Social History   Tobacco Use   Smoking status: Never   Smokeless tobacco: Never  Vaping Use   Vaping Use: Never used  Substance Use Topics   Alcohol use: No    Alcohol/week: 0.0 standard drinks   Drug use: Not Currently     Allergies   Iodinated  diagnostic agents   Review of Systems Review of Systems + nose congestion, L ear pain, Neg for cough, fever, chills, sweating  Physical Exam Triage Vital Signs ED Triage Vitals  Enc Vitals Group     BP 12/19/20 1329 (!) 154/99     Pulse Rate 12/19/20 1329 82     Resp 12/19/20 1329 16     Temp 12/19/20 1329 98 F (36.7 C)     Temp Source 12/19/20 1329 Oral     SpO2 12/19/20 1329 97 %     Weight 12/19/20 1330 134 lb (60.8 kg)     Height 12/19/20 1330 _0  (1.6 m)     Head Circumference --      Peak Flow --  Pain Score 12/19/20 1330 4     Pain Loc --      Pain Edu? --      Excl. in [place]? --    No data found.  Updated Vital Signs BP (!) 154/99   Pulse 82   Temp 98 F (36.7 C) (Oral)   Resp 16   Ht _0  (1.6 m)   Wt 134 lb (60.8 kg)   SpO2 97%   BMI 23.74 kg/m   Visual Acuity Right Eye Distance:   Left Eye Distance:   Bilateral Distance:    Right Eye Near:   Left Eye Near:    Bilateral Near:     Physical Exam Constitutional:      General: She is not in acute distress. HENT:     Head: Normocephalic.     Right Ear: Tympanic membrane, ear canal and external ear normal.     Left Ear: Tympanic membrane, ear canal and external ear normal.     Nose: Congestion present.     Comments: L>R with clear mucous    Mouth/Throat:     Mouth: Mucous membranes are moist.     Pharynx: Oropharynx is clear.     Comments: Has tenderness on L jaw angle.  Eyes:     General: No scleral icterus.    Conjunctiva/sclera: Conjunctivae normal.  Cardiovascular:     Rate and Rhythm: Normal rate and regular rhythm.  Pulmonary:     Effort: Pulmonary effort is normal.     Breath sounds: Normal breath sounds.  Skin:    General: Skin is warm and dry.     Findings: No rash.  Neurological:     Mental Status: She is alert and oriented to person, place, and time.     Gait: Gait normal.  Psychiatric:        Mood and Affect: Mood normal.        Behavior: Behavior normal.         Thought Content: Thought content normal.        Judgment: Judgment normal.     UC Treatments / Results  Labs (all labs ordered are listed, but only abnormal results are displayed) Labs Reviewed - No data to display  EKG   Radiology No results found.  Procedures Procedures (including critical care time)  Medications Ordered in UC Medications - No data to display  Initial Impression / Assessment and Plan / UC Course  I have reviewed the triage vital signs and the nursing notes. Has eustachian tube dysfunction and sinus HA. I dont see any signs of bacterial sinusitis. I placed her on Prednisone as noted.   Final Clinical Impressions(s) / UC Diagnoses   Final diagnoses:  Sinus headache  Dysfunction of left eustachian tube   Discharge Instructions   None    ED Prescriptions     Medication Sig Dispense Auth. Provider   predniSONE (DELTASONE) 20 MG tablet take by mouth daily for 5 days 5 tablet Rodriguez-Southworth, Sunday Spillers, PA-C      PDMP not reviewed this encounter.   Shelby Mattocks, PA-C 12/19/20 1412

## 2020-12-19 NOTE — ED Triage Notes (Signed)
Pt reports having L ear pain and sinus pressure since Friday.

## 2020-12-26 ENCOUNTER — Other Ambulatory Visit: Payer: Self-pay

## 2021-01-03 ENCOUNTER — Telehealth: Payer: 59 | Admitting: Nurse Practitioner

## 2021-01-03 DIAGNOSIS — J329 Chronic sinusitis, unspecified: Secondary | ICD-10-CM

## 2021-01-03 NOTE — Progress Notes (Signed)
Based on what you shared with me it looks like you have recurrent sinusitis.,that should be evaluated in a face to face office visit. You were recently seenin the ED and given prednisone. You will need to speak with your ENT for proper treatment sinc ethis is recurrent.  NOTE: There will be NO CHARGE for this eVisit   If you are having a true medical emergency please call 911.      For an urgent face to face visit, [facility] has six urgent care centers for your convenience:     [facility] [facility] [place] Get Driving Directions [id number] [place], [place] [address]    [facility]) Get Driving Directions [id number] [place], [place] [address]  [facility] [place] ([place]) Get Driving Directions [id number] [address] [place],  [place]  [address]  [facility] Get Driving Directions [id number] [address] [place], [place], [place] [address]   [facility] Get Driving Directions  [id number] [address] [place], [place] [address]   [facility] Get Driving Directions [id number] [address], [place], [place] [address]  Your MyChart E-visit questionnaire answers were reviewed by a board certified advanced clinical practitioner to complete your personal care plan based on your specific symptoms.  Thank you for using e-Visits.

## 2021-01-04 DIAGNOSIS — B349 Viral infection, unspecified: Secondary | ICD-10-CM | POA: Diagnosis not present

## 2021-01-16 DIAGNOSIS — R519 Headache, unspecified: Secondary | ICD-10-CM | POA: Diagnosis not present

## 2021-01-17 ENCOUNTER — Other Ambulatory Visit: Payer: Self-pay | Admitting: Unknown Physician Specialty

## 2021-01-17 DIAGNOSIS — R519 Headache, unspecified: Secondary | ICD-10-CM

## 2021-01-18 ENCOUNTER — Other Ambulatory Visit: Payer: 59

## 2021-01-23 ENCOUNTER — Other Ambulatory Visit: Payer: Self-pay

## 2021-01-23 ENCOUNTER — Ambulatory Visit
Admission: RE | Admit: 2021-01-23 | Discharge: 2021-01-23 | Disposition: A | Discharge status: Home / Self Care | Payer: 59 | Source: Ambulatory Visit | Attending: Unknown Physician Specialty | Admitting: Unknown Physician Specialty

## 2021-01-23 DIAGNOSIS — J3489 Other specified disorders of nose and nasal sinuses: Secondary | ICD-10-CM | POA: Diagnosis not present

## 2021-01-23 DIAGNOSIS — R519 Headache, unspecified: Secondary | ICD-10-CM | POA: Insufficient documentation

## 2021-01-31 ENCOUNTER — Other Ambulatory Visit: Payer: Self-pay

## 2021-02-01 ENCOUNTER — Other Ambulatory Visit: Payer: Self-pay

## 2021-02-01 ENCOUNTER — Ambulatory Visit: Payer: 59

## 2021-02-01 MED ORDER — LEVOTHYROXINE SODIUM 50 MCG PO TABS
ORAL_TABLET | ORAL | 3 refills | Status: DC
  Filled 2021-02-01: qty 90, 90d supply, fill #0

## 2021-02-05 DIAGNOSIS — R399 Unspecified symptoms and signs involving the genitourinary system: Secondary | ICD-10-CM | POA: Diagnosis not present

## 2021-02-06 ENCOUNTER — Other Ambulatory Visit: Payer: Self-pay

## 2021-02-15 DIAGNOSIS — H40003 Preglaucoma, unspecified, bilateral: Secondary | ICD-10-CM | POA: Diagnosis not present

## 2021-02-15 DIAGNOSIS — H353131 Nonexudative age-related macular degeneration, bilateral, early dry stage: Secondary | ICD-10-CM | POA: Diagnosis not present

## 2021-02-20 ENCOUNTER — Other Ambulatory Visit: Payer: Self-pay

## 2021-02-20 ENCOUNTER — Ambulatory Visit: Payer: 59 | Attending: Internal Medicine

## 2021-02-20 DIAGNOSIS — Z23 Encounter for immunization: Secondary | ICD-10-CM

## 2021-02-20 MED ORDER — PFIZER COVID-19 VAC BIVALENT 30 MCG/0.3ML IM SUSP
INTRAMUSCULAR | 0 refills | Status: DC
  Filled 2021-02-20: qty 0.3, 1d supply, fill #0

## 2021-02-20 NOTE — Progress Notes (Signed)
   Covid-19 Vaccination Clinic  Name:  [name]    MRN: [id number] DOB: May 25, 1950  02/20/2021  [name] was observed post Covid-19 immunization for 15 minutes without incident. She was provided with Vaccine Information Sheet and instruction to access the V-Safe system.   [name] was instructed to call 911 with any severe reactions post vaccine: Difficulty breathing  Swelling of face and throat  A fast heartbeat  A bad rash all over body  Dizziness and weakness   Lu Duffel, PharmD, MBA Clinical Acute Care Pharmacist

## 2021-03-01 ENCOUNTER — Telehealth: Payer: Self-pay

## 2021-03-01 ENCOUNTER — Other Ambulatory Visit: Payer: Self-pay

## 2021-03-01 DIAGNOSIS — Z1211 Encounter for screening for malignant neoplasm of colon: Secondary | ICD-10-CM

## 2021-03-01 MED ORDER — SUTAB 1479-225-188 MG PO TABS
ORAL_TABLET | ORAL | 0 refills | Status: DC
  Filled 2021-03-01: qty 24, 1d supply, fill #0

## 2021-03-01 NOTE — Telephone Encounter (Signed)
Called [name] and left her a voicemail to call me back so we could schedule her screening colooscopy.

## 2021-03-02 ENCOUNTER — Other Ambulatory Visit: Payer: Self-pay

## 2021-03-02 NOTE — Telephone Encounter (Signed)
Mrs. Didion called back and she scheduled her colonoscopy to be on 04/13/2021. Instructions were given and sent via MyChart as well.

## 2021-03-06 ENCOUNTER — Other Ambulatory Visit: Payer: Self-pay

## 2021-03-07 ENCOUNTER — Other Ambulatory Visit: Payer: Self-pay

## 2021-03-07 MED ORDER — LEVOTHYROXINE SODIUM 50 MCG PO TABS
50.0000 ug | ORAL_TABLET | Freq: Every day | ORAL | 1 refills | Status: AC
  Filled 2021-03-07: qty 90, 90d supply, fill #0

## 2021-03-13 ENCOUNTER — Encounter: Payer: Self-pay | Admitting: Dermatology

## 2021-03-13 ENCOUNTER — Other Ambulatory Visit: Payer: Self-pay

## 2021-03-13 ENCOUNTER — Ambulatory Visit (INDEPENDENT_AMBULATORY_CARE_PROVIDER_SITE_OTHER): Payer: 59 | Admitting: Dermatology

## 2021-03-13 DIAGNOSIS — L988 Other specified disorders of the skin and subcutaneous tissue: Secondary | ICD-10-CM

## 2021-03-13 DIAGNOSIS — L570 Actinic keratosis: Secondary | ICD-10-CM | POA: Diagnosis not present

## 2021-03-13 DIAGNOSIS — L578 Other skin changes due to chronic exposure to nonionizing radiation: Secondary | ICD-10-CM

## 2021-03-13 DIAGNOSIS — L821 Other seborrheic keratosis: Secondary | ICD-10-CM

## 2021-03-13 DIAGNOSIS — L820 Inflamed seborrheic keratosis: Secondary | ICD-10-CM

## 2021-03-13 NOTE — Patient Instructions (Signed)

## 2021-03-13 NOTE — Progress Notes (Signed)
Follow-Up Visit   Subjective  [name] is a 70 y.o. 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  She would like Botox in the frown complex. She also has several rough areas that are growing and irritating she would like checked today.  The following portions of the chart were reviewed this encounter and updated as appropriate:   Tobacco  Allergies  Meds  Problems  Med Hx  Surg Hx  Fam Hx     Review of Systems:  No other skin or systemic complaints except as noted in HPI or Assessment and Plan.  Objective  Well appearing patient in no apparent distress; mood and affect are within normal limits.  A focused examination was performed including face, neck, arms, legs. Relevant physical exam findings are noted in the Assessment and Plan.  Left Forearm - Posterior (2) Erythematous thin papules/macules with gritty scale.   Legs, arms, neck x 10 Erythematous keratotic or waxy stuck-on papule or plaque.   Face Rhytides and volume loss.       Assessment & Plan   Actinic Damage - chronic, secondary to cumulative UV radiation exposure/sun exposure over time - diffuse scaly erythematous macules with underlying dyspigmentation - Recommend daily broad spectrum sunscreen SPF 30+ to sun-exposed areas, reapply every 2 hours as needed.  - Recommend staying in the shade or wearing long sleeves, sun glasses (UVA+UVB protection) and wide brim hats (4-inch brim around the entire circumference of the hat). - Call for new or changing lesions.  Seborrheic Keratoses - Stuck-on, waxy, tan-brown papules and/or plaques  - Benign-appearing - Discussed benign etiology and prognosis. - Observe - Call for any changes  AK (actinic keratosis) (2) Left Forearm - Posterior  Destruction of lesion - Left Forearm - Posterior Complexity: simple   Destruction method: cryotherapy   Informed consent: discussed and consent obtained   Timeout:  patient  name, date of birth, surgical site, and procedure verified Lesion destroyed using liquid nitrogen: Yes   Region frozen until ice ball extended beyond lesion: Yes   Outcome: patient tolerated procedure well with no complications   Post-procedure details: wound care instructions given    Inflamed seborrheic keratosis Legs, arms, neck x 10  Destruction of lesion - Legs, arms, neck x 10 Complexity: simple   Destruction method: cryotherapy   Informed consent: discussed and consent obtained   Timeout:  patient name, date of birth, surgical site, and procedure verified Lesion destroyed using liquid nitrogen: Yes   Region frozen until ice ball extended beyond lesion: Yes   Outcome: patient tolerated procedure well with no complications   Post-procedure details: wound care instructions given    Elastosis of skin Face Filling material injection - Face Location: See attached image  Informed consent: Discussed risks (infection, pain, bleeding, bruising, swelling, allergic reaction, paralysis of nearby muscles, eyelid droop, double vision, neck weakness, difficulty breathing, headache, undesirable cosmetic result, and need for additional treatment) and benefits of the procedure, as well as the alternatives.  Informed consent was obtained.  Preparation: The area was cleansed with alcohol.  Procedure Details:  Botox was injected into the dermis with a 30-gauge needle. Pressure applied to any bleeding. Ice packs offered for swelling.  Lot Number:  [id number] Expiration:  12/2022  Total Units Injected:  27.5  Plan: Patient was instructed to remain upright for 4 hours. Patient was instructed to avoid massaging the face and avoid vigorous exercise for the rest of the day. Tylenol may  be used for headache.  Allow 2 weeks before returning to clinic for additional dosing as needed. Patient will call for any problems.   Filling material injection - Face Prior to the procedure, the patient's past  medical history, allergies and the rare but potential risks and complications were reviewed with the patient and a signed consent was obtained. Pre and post-treatment care was discussed and instructions provided.  Location: perioral; oral commissure/corners of the mouth; anterior chin crease; marionette lines and lower nasolabial folds  Filler Type: Restylane Refyne Lot #[id number] Exp 11/02/2021  Procedure: The area was prepped thoroughly with Puracyn. After introducing the needle into the desired treatment area, the syringe plunger was drawn back to ensure there was no flash of blood prior to injecting the filler in order to minimize risk of intravascular injection and vascular occlusion. After injection of the filler, the treated areas were cleansed and iced to reduce swelling. Post-treatment instructions were reviewed with the patient.       Patient tolerated the procedure well. The patient will call with any problems, questions or concerns prior to their next appointment.  Return if symptoms worsen or fail to improve.  I, Ashok Cordia, CMA, am acting as scribe for Sarina Ser, MD . Documentation: I have reviewed the above documentation for accuracy and completeness, and I agree with the above.  Sarina Ser, MD

## 2021-03-16 DIAGNOSIS — Z0000 Encounter for general adult medical examination without abnormal findings: Secondary | ICD-10-CM | POA: Diagnosis not present

## 2021-03-16 DIAGNOSIS — E039 Hypothyroidism, unspecified: Secondary | ICD-10-CM | POA: Diagnosis not present

## 2021-03-23 DIAGNOSIS — E039 Hypothyroidism, unspecified: Secondary | ICD-10-CM | POA: Diagnosis not present

## 2021-03-23 DIAGNOSIS — Z0000 Encounter for general adult medical examination without abnormal findings: Secondary | ICD-10-CM | POA: Diagnosis not present

## 2021-03-23 DIAGNOSIS — M1712 Unilateral primary osteoarthritis, left knee: Secondary | ICD-10-CM | POA: Diagnosis not present

## 2021-03-26 ENCOUNTER — Other Ambulatory Visit: Payer: Self-pay

## 2021-03-26 MED ORDER — AMOXICILLIN-POT CLAVULANATE 875-125 MG PO TABS
ORAL_TABLET | ORAL | 0 refills | Status: DC
  Filled 2021-03-26: qty 14, 7d supply, fill #0

## 2021-03-26 MED ORDER — PREVNAR 20 0.5 ML IM SUSY
0.5000 mL | PREFILLED_SYRINGE | Freq: Once | INTRAMUSCULAR | 0 refills | Status: AC
  Filled 2021-04-10: qty 0.5, 1d supply, fill #0

## 2021-04-02 DIAGNOSIS — N898 Other specified noninflammatory disorders of vagina: Secondary | ICD-10-CM | POA: Diagnosis not present

## 2021-04-02 DIAGNOSIS — Z124 Encounter for screening for malignant neoplasm of cervix: Secondary | ICD-10-CM | POA: Diagnosis not present

## 2021-04-03 ENCOUNTER — Other Ambulatory Visit: Payer: Self-pay

## 2021-04-03 DIAGNOSIS — H401122 Primary open-angle glaucoma, left eye, moderate stage: Secondary | ICD-10-CM | POA: Diagnosis not present

## 2021-04-03 DIAGNOSIS — H401111 Primary open-angle glaucoma, right eye, mild stage: Secondary | ICD-10-CM | POA: Diagnosis not present

## 2021-04-03 MED ORDER — LATANOPROST 0.005 % OP SOLN
OPHTHALMIC | 4 refills | Status: DC
Start: 2021-04-03 — End: 2022-05-15
  Filled 2021-04-03: qty 2.5, 18d supply, fill #0

## 2021-04-03 MED FILL — Fluticasone Propionate Nasal Susp 50 MCG/ACT: NASAL | 30 days supply | Qty: 16 | Fill #1 | Status: AC

## 2021-04-04 ENCOUNTER — Other Ambulatory Visit: Payer: Self-pay

## 2021-04-05 ENCOUNTER — Other Ambulatory Visit: Payer: Self-pay

## 2021-04-06 ENCOUNTER — Other Ambulatory Visit: Payer: Self-pay

## 2021-04-06 DIAGNOSIS — M7918 Myalgia, other site: Secondary | ICD-10-CM | POA: Diagnosis not present

## 2021-04-06 DIAGNOSIS — R29890 Loss of height: Secondary | ICD-10-CM | POA: Diagnosis not present

## 2021-04-06 DIAGNOSIS — G500 Trigeminal neuralgia: Secondary | ICD-10-CM | POA: Diagnosis not present

## 2021-04-06 DIAGNOSIS — E559 Vitamin D deficiency, unspecified: Secondary | ICD-10-CM | POA: Diagnosis not present

## 2021-04-06 MED ORDER — CYCLOBENZAPRINE HCL 10 MG PO TABS
ORAL_TABLET | ORAL | 3 refills | Status: DC
Start: 2021-04-06 — End: 2022-05-15
  Filled 2021-04-06: qty 45, 22d supply, fill #0

## 2021-04-10 ENCOUNTER — Other Ambulatory Visit: Payer: Self-pay

## 2021-04-12 ENCOUNTER — Encounter: Payer: Self-pay | Admitting: Gastroenterology

## 2021-04-13 ENCOUNTER — Ambulatory Visit: Payer: 59 | Admitting: Registered Nurse

## 2021-04-13 ENCOUNTER — Other Ambulatory Visit: Payer: Self-pay

## 2021-04-13 ENCOUNTER — Ambulatory Visit
Admission: RE | Admit: 2021-04-13 | Discharge: 2021-04-13 | Disposition: A | Discharge status: Home / Self Care | Payer: 59 | Attending: Gastroenterology | Admitting: Gastroenterology

## 2021-04-13 ENCOUNTER — Encounter: Payer: Self-pay | Admitting: Gastroenterology

## 2021-04-13 ENCOUNTER — Encounter
Admission: RE | Disposition: A | Discharge status: Home / Self Care | Payer: Self-pay | Source: Home / Self Care | Attending: Gastroenterology

## 2021-04-13 DIAGNOSIS — D124 Benign neoplasm of descending colon: Secondary | ICD-10-CM | POA: Diagnosis not present

## 2021-04-13 DIAGNOSIS — K5730 Diverticulosis of large intestine without perforation or abscess without bleeding: Secondary | ICD-10-CM | POA: Insufficient documentation

## 2021-04-13 DIAGNOSIS — K635 Polyp of colon: Secondary | ICD-10-CM | POA: Diagnosis not present

## 2021-04-13 DIAGNOSIS — K648 Other hemorrhoids: Secondary | ICD-10-CM | POA: Diagnosis not present

## 2021-04-13 DIAGNOSIS — Z1211 Encounter for screening for malignant neoplasm of colon: Secondary | ICD-10-CM | POA: Diagnosis not present

## 2021-04-13 DIAGNOSIS — K649 Unspecified hemorrhoids: Secondary | ICD-10-CM | POA: Diagnosis not present

## 2021-04-13 DIAGNOSIS — K5790 Diverticulosis of intestine, part unspecified, without perforation or abscess without bleeding: Secondary | ICD-10-CM | POA: Diagnosis not present

## 2021-04-13 HISTORY — PX: COLONOSCOPY WITH PROPOFOL: SHX5780

## 2021-04-13 SURGERY — COLONOSCOPY WITH PROPOFOL
Anesthesia: General

## 2021-04-13 MED ORDER — PROPOFOL 500 MG/50ML IV EMUL
INTRAVENOUS | Status: DC | PRN
  Administered 2021-04-13: 140 ug/kg/min via INTRAVENOUS

## 2021-04-13 MED ORDER — PROPOFOL 10 MG/ML IV BOLUS
INTRAVENOUS | Status: DC | PRN
  Administered 2021-04-13 (×2): 20 mg via INTRAVENOUS
  Administered 2021-04-13: 30 mg via INTRAVENOUS
  Administered 2021-04-13: 70 mg via INTRAVENOUS

## 2021-04-13 MED ORDER — PROPOFOL 500 MG/50ML IV EMUL
INTRAVENOUS | Status: AC
  Filled 2021-04-13: qty 150

## 2021-04-13 MED ORDER — LIDOCAINE HCL (CARDIAC) PF 100 MG/5ML IV SOSY: PREFILLED_SYRINGE | INTRAVENOUS | Status: DC | PRN

## 2021-04-13 MED ORDER — SODIUM CHLORIDE 0.9 % IV SOLN: INTRAVENOUS | Status: DC

## 2021-04-13 NOTE — Anesthesia Postprocedure Evaluation (Signed)
Anesthesia Post Note  Patient: [name]  Procedure(s) Performed: COLONOSCOPY WITH PROPOFOL  Patient location during evaluation: Endoscopy Anesthesia Type: General Level of consciousness: awake and alert Pain management: pain level controlled Vital Signs Assessment: post-procedure vital signs reviewed and stable Respiratory status: spontaneous breathing, nonlabored ventilation, respiratory function stable and patient connected to nasal cannula oxygen Cardiovascular status: blood pressure returned to baseline and stable Postop Assessment: no apparent nausea or vomiting Anesthetic complications: no   No notable events documented.   Last Vitals:  Vitals:   04/13/21 0919 04/13/21 0929  BP: 133/76 129/82  Pulse: 71 71  Resp: 19 (!) 24  Temp:    SpO2: 100% 100%    Last Pain:  Vitals:   04/13/21 0929  TempSrc:   PainSc: 0-No pain                 Arita Miss

## 2021-04-13 NOTE — Op Note (Signed)
[facility] Patient Name: [name] Procedure Date: 04/13/2021 8:04 AM MRN: [id number] Account #: [id number] Date of Birth: 26-Apr-1951 Admit Type: Outpatient Age: 70 Room: [facility] ENDO ROOM 2 Gender: Female Note Status: Finalized Instrument Name: Jasper Riling [id number] Procedure:             Colonoscopy Indications:           Screening for colorectal malignant neoplasm Providers:             Cortlyn Cannell B. Bonna Gains MD, MD Referring MD:          Ramonita Lab, MD (Referring MD) Medicines:             Monitored Anesthesia Care Complications:         No immediate complications. Procedure:             Pre-Anesthesia Assessment:                        - ASA Grade Assessment: II - A patient with mild                         systemic disease.                        - Prior to the procedure, a History and Physical was                         performed, and patient medications, allergies and                         sensitivities were reviewed. The patient's tolerance                         of previous anesthesia was reviewed.                        - The risks and benefits of the procedure and the                         sedation options and risks were discussed with the                         patient. All questions were answered and informed                         consent was obtained.                        - Patient identification and proposed procedure were                         verified prior to the procedure by the physician, the                         nurse, the anesthesiologist, the anesthetist and the                         technician. The procedure was verified in the  procedure room.                        After obtaining informed consent, the colonoscope was                         passed under direct vision. Throughout the procedure,                         the patient's blood pressure, pulse, and oxygen                          saturations were monitored continuously. The                         Colonoscope was introduced through the anus and                         advanced to the the cecum, identified by appendiceal                         orifice and ileocecal valve. The colonoscopy was                         performed with ease. The patient tolerated the                         procedure well. The quality of the bowel preparation                         was good. Findings:      The perianal and digital rectal examinations were normal.      Five sessile polyps were found in the descending colon. The polyps were       3 to 5 mm in size. These polyps were removed with a jumbo cold forceps.       Resection and retrieval were complete.      Multiple diverticula were found in the sigmoid colon.      The exam was otherwise without abnormality.      The rectum, sigmoid colon, descending colon, transverse colon, ascending       colon and cecum appeared normal.      Non-bleeding internal hemorrhoids were found during retroflexion.      No additional abnormalities were found on retroflexion. Impression:            - Five 3 to 5 mm polyps in the descending colon,                         removed with a jumbo cold forceps. Resected and                         retrieved.                        - Diverticulosis in the sigmoid colon.                        - The examination was otherwise normal.                        -  The rectum, sigmoid colon, descending colon,                         transverse colon, ascending colon and cecum are normal.                        - Non-bleeding internal hemorrhoids. Recommendation:        - Discharge patient to home (with escort).                        - High fiber diet.                        - Advance diet as tolerated.                        - Continue present medications.                        - Await pathology results.                        - Repeat colonoscopy date to  be determined after                         pending pathology results are reviewed.                        - The findings and recommendations were discussed with                         the patient.                        - The findings and recommendations were discussed with                         the patient's family.                        - Return to primary care physician as previously                         scheduled. Procedure Code(s):     --- Professional ---                        [phone], Colonoscopy, flexible; with biopsy, single or                         multiple Diagnosis Code(s):     --- Professional ---                        Z12.11, Encounter for screening for malignant neoplasm                         of colon                        K63.5, Polyp of colon CPT copyright 2019 American Medical Association. All rights reserved. The codes documented in this report are preliminary and upon coder review may  be revised to meet current compliance requirements.  Vonda Antigua, MD Margretta Sidle B. Bonna Gains MD, MD 04/13/2021 8:47:39 AM This report has been signed electronically. Number of Addenda: 0 Note Initiated On: 04/13/2021 8:04 AM Scope Withdrawal Time: 0 hours 28 minutes 19 seconds  Total Procedure Duration: 0 hours 32 minutes 29 seconds  Estimated Blood Loss:  Estimated blood loss: none.      [facility]

## 2021-04-13 NOTE — H&P (Addendum)
Vonda Antigua, MD [address], [place], [place], [place], [address] [address], [place], [place], [place], [address] Phone: [phone]  Fax: [phone]  Primary Care Physician:  Adin Hector, MD   Pre-Procedure History & Physical: HPI:  [name] is a 70 y.o. female is here for a colonoscopy.   Past Medical History:  Diagnosis Date   Basal cell carcinoma 12/08/2008   right sup medial breast   Basal cell carcinoma 06/10/2017   left lat nasal bridge inf to medial canthus   Basal cell carcinoma 03/02/2019   right distal med calf   History of dysplastic nevus 05/02/2010   right sup pubic/mild    Past Surgical History:  Procedure Laterality Date   ABDOMINAL HYSTERECTOMY     COLONOSCOPY     several times   KNEE SURGERY     TOTAL VAGINAL HYSTERECTOMY      Prior to Admission medications   Medication Sig Start Date End Date Taking? Authorizing Provider  calcium carbonate (OS-CAL) 1250 (500 Ca) MG chewable tablet Chew 1 tablet by mouth daily.   Yes [provider]  cetirizine (ZYRTEC) 10 MG chewable tablet Chew 10 mg by mouth daily.   Yes [provider]  fluticasone (FLONASE) 50 MCG/ACT nasal spray USE TWO SPRAYS IN [facility] NOSTRIL DAILY 06/02/20 06/02/21 Yes Beverly Gust, MD  latanoprost (XALATAN) 0.005 % ophthalmic solution Apply 1 drop into both eyes every night 04/03/21  Yes   levothyroxine (SYNTHROID) 50 MCG tablet Take 1 tablet (50 mcg total) by mouth every morning before breakfast (0630) ON AN EMPTY STOMACH WITH A GLASS OF WATER AT LEAST 30-60 MINUTES BEFORE BREAKFAST 12/05/20  Yes   levothyroxine (SYNTHROID) 50 MCG tablet Take 1 tablet (50 mcg total) by mouth every morning before breakfast (0630) ON AN EMPTY STOMACH WITH A GLASS OF WATER AT LEAST 30-60 MINUTES BEFORE BREAKFAST 02/01/21  Yes   albuterol (VENTOLIN HFA) 108 (90 Base) MCG/ACT inhaler Inhale 2 puffs into the lungs every 4 (four) hours as needed for wheezing or shortness of  breath. 06/24/19   Sharion Balloon, NP  albuterol (VENTOLIN HFA) 108 (90 Base) MCG/ACT inhaler INHALE 2 PUFFS INTO THE LUNGS EVERY 6 TO 8 HOURS AS NEEDED FOR COUGH/WHEEZING 04/19/20 04/19/21  Beverly Gust, MD  amoxicillin-clavulanate (AUGMENTIN) 875-125 MG tablet Take 1 tablet (875 mg total) by mouth 2 (two) times daily for 7 days Patient not taking: Reported on 04/13/2021 03/26/21     cholecalciferol (VITAMIN D) 1000 units tablet Take 1,000 Units by mouth daily.    [provider]  COVID-19 At Home Antigen Test [facility] COVID-19 HOME TEST) KIT Use as directed 11/22/20   Letta Median, RPH  COVID-19 mRNA bivalent vaccine, Pfizer, (PFIZER COVID-19 VAC BIVALENT) injection Inject into the muscle. 02/20/21   Carlyle Basques, MD  COVID-19 mRNA Vac-TriS, Pfizer, (PFIZER-BIONT COVID-19 VAC-TRIS) SUSP injection Inject into the muscle. 08/10/20   Carlyle Basques, MD  cyclobenzaprine (FLEXERIL) 10 MG tablet Take 1 tablet (10 mg total) by mouth 2 (two) times daily as needed (pain) 04/06/21     fexofenadine (ALLEGRA) 30 MG tablet Take 30 mg by mouth daily.     [provider]  fluconazole (DIFLUCAN) 200 MG tablet take 1 pill every other day 11/08/20     fluticasone (FLONASE) 50 MCG/ACT nasal spray Place 1 spray into both nostrils daily. 06/03/18   [provider]  ibuprofen (ADVIL) 800 MG tablet Take 1 tablet (800 mg total) by mouth 3 (  three) times daily. 12/06/18   Danton Clap, PA-C  levocetirizine (XYZAL) 5 MG tablet Take 1 tablet (5 mg total) by mouth every evening. 08/31/19   Sharion Balloon, NP  levofloxacin (LEVAQUIN) 750 MG tablet TAKE ONE TABLET BY MOUTH DAILY 04/19/20 04/19/21  Beverly Gust, MD  levothyroxine (SYNTHROID) 50 MCG tablet TAKE 1 TABLET BY MOUTH ONCE DAILY ON AN EMPTY STOMACH WITH A GLASS OF WATER AT LEAST 30-60 MINUTES BEFORE BREAKFAST. 06/02/20 06/02/21  Tama High III, MD  levothyroxine (SYNTHROID) 50 MCG tablet TAKE 1 TABLET BY MOUTH ONCE DAILY ON AN EMPTY  STOMACH WITH A GLASS OF WATER AT LEAST 30-60 MINUTES BEFORE BREAKFAST. Patient taking differently: Take by mouth daily before breakfast. ON AN EMPTY STOMACH WITH A GLASS OF WATER AT LEAST 30-60 MINUTES BEFORE BREAKFAST. 12/07/19 12/06/20  Tama High III, MD  levothyroxine (SYNTHROID) 50 MCG tablet TAKE 1 TABLET BY MOUTH ONCE DAILY ON AN EMPTY STOMACH WITH A GLASS OF WATER AT LEAST 30-60 MINUTES BEFORE BREAKFAST. 03/07/21     meloxicam (MOBIC) 7.5 MG tablet TAKE 1 TABLET BY MOUTH TWICE DAILY AFTER MEALS 05/16/20 05/16/21  Hooten, Laurice Record, MD  methylPREDNISolone (MEDROL) 4 MG TBPK tablet Take as directed 11/08/20     montelukast (SINGULAIR) 10 MG tablet Take 1 tablet (10 mg total) by mouth daily for 30 days. 09/18/18 10/18/18  [name], PA-C  montelukast (SINGULAIR) 10 MG tablet TAKE 1 TABLET BY MOUTH NIGHTLY 09/07/20     Multiple Vitamin (MULTI-VITAMINS) TABS Take by mouth.    [provider]  mupirocin cream (BACTROBAN) 2 % Apply 1 application topically 2 (two) times daily. 09/18/18   [name], PA-C  predniSONE (DELTASONE) 20 MG tablet take by mouth daily for 5 days 12/19/20   Rodriguez-Southworth, Sunday Spillers, PA-C  Sodium Sulfate-Mag Sulfate-KCl (SUTAB) [phone] MG TABS At 5 PM take 12 tablets using the 8 oz cup provided in the kit drinking 5 cups of water and 5 hours before your procedure repeat the same process. Patient not taking: Reported on 04/13/2021 03/01/21   Jonathon Bellows, MD  valACYclovir (VALTREX) 1000 MG tablet Take 1 tablet (1,000 mg total) by mouth 3 (three) times daily. 12/06/18   Laurene Footman B, PA-C    Allergies as of 03/01/2021 - Review Complete 01/03/2021  Allergen Reaction Noted   Iodinated diagnostic agents Hives 02/09/2015    Family History  Problem Relation Age of Onset   Lung cancer Father    Breast cancer Neg Hx     Social History   Socioeconomic History   Marital status: Married    Spouse name: Not on file   Number of children: Not on file   Years  of education: Not on file   Highest education level: Not on file  Occupational History   Not on file  Tobacco Use   Smoking status: Never   Smokeless tobacco: Never  Vaping Use   Vaping Use: Never used  Substance and Sexual Activity   Alcohol use: No    Alcohol/week: 0.0 standard drinks   Drug use: Not Currently   Sexual activity: Not on file  Other Topics Concern   Not on file  Social History Narrative   Not on file   Social Determinants of Health   Financial Resource Strain: Not on file  Food Insecurity: Not on file  Transportation Needs: Not on file  Physical Activity: Not on file  Stress: Not on file  Social Connections: Not on file  Intimate  Partner Violence: Not on file    Review of Systems: See HPI, otherwise negative ROS  Physical Exam: Constitutional: General:   Alert,  Well-developed, well-nourished, pleasant and cooperative in NAD BP (!) 153/95   Pulse 80   Temp (!) 97 F (36.1 C) (Temporal)   Resp 20   Ht '5\' 3"'  (1.6 m)   Wt 59.9 kg   SpO2 100%   BMI 23.38 kg/m   Head: Normocephalic, atraumatic.   Eyes:  Sclera clear, no icterus.   Conjunctiva pink.   Mouth:  No deformity or lesions, oropharynx pink & moist.  Neck:  Supple, trachea midline  Respiratory: Normal respiratory effort  Gastrointestinal:  Soft, non-tender and non-distended without masses, hepatosplenomegaly or hernias noted.  No guarding or rebound tenderness.     Cardiac: No clubbing or edema.  No cyanosis. Normal posterior tibial pedal pulses noted.  Lymphatic:  No significant cervical adenopathy.  Psych:  Alert and cooperative. Normal mood and affect.  Musculoskeletal:   Symmetrical without gross deformities. 5/5 Lower extremity strength bilaterally.  Skin: Warm. Intact without significant lesions or rashes. No jaundice.  Neurologic:  Face symmetrical, tongue midline, Normal sensation to touch;  grossly normal neurologically.  Psych:  Alert and oriented x3, Alert and  cooperative. Normal mood and affect.  Impression/Plan: [name] is here for a colonoscopy to be performed for average risk screening. Pt reports she has had colonoscopies in the past and thinks they were at [facility]. Denies any previous polyps. Procedure reports not available  Risks, benefits, limitations, and alternatives regarding  colonoscopy have been reviewed with the patient.  Questions have been answered.  All parties agreeable.   Virgel Manifold, MD  04/13/2021, 8:03 AM

## 2021-04-13 NOTE — Transfer of Care (Signed)
Immediate Anesthesia Transfer of Care Note  Patient: [name]  Procedure(s) Performed: COLONOSCOPY WITH PROPOFOL  Patient Location: PACU  Anesthesia Type:General  Level of Consciousness: awake, alert  and oriented  Airway & Oxygen Therapy: Patient Spontanous Breathing  Post-op Assessment: Report given to RN and Post -op Vital signs reviewed and stable  Post vital signs: Reviewed and stable  Last Vitals:  Vitals Value Taken Time  BP 130/75 04/13/21 0849  Temp    Pulse 90 04/13/21 0850  Resp 16 04/13/21 0850  SpO2 100 % 04/13/21 0850  Vitals shown include unvalidated device data.  Last Pain:  Vitals:   04/13/21 0849  TempSrc:   PainSc: 0-No pain         Complications: No notable events documented.

## 2021-04-13 NOTE — Anesthesia Preprocedure Evaluation (Signed)
Anesthesia Evaluation  Patient identified by MRN, date of birth, ID band Patient awake    Reviewed: Allergy & Precautions, NPO status , Patient's Chart, lab work & pertinent test results  History of Anesthesia Complications Negative for: history of anesthetic complications  Airway Mallampati: II  TM Distance: >3 FB Neck ROM: Full    Dental no notable dental hx. (+) Teeth Intact   Pulmonary neg pulmonary ROS, neg sleep apnea, neg COPD, Patient abstained from smoking.Not current smoker,    Pulmonary exam normal breath sounds clear to auscultation       Cardiovascular Exercise Tolerance: Good METS(-) hypertension(-) CAD and (-) Past MI negative cardio ROS  (-) dysrhythmias  Rhythm:Regular Rate:Normal - Systolic murmurs    Neuro/Psych negative neurological ROS  negative psych ROS   GI/Hepatic neg GERD  ,(+)     (-) substance abuse  ,   Endo/Other  neg diabetesHypothyroidism   Renal/GU negative Renal ROS     Musculoskeletal  (+) Arthritis ,   Abdominal   Peds  Hematology   Anesthesia Other Findings Past Medical History: 12/08/2008: Basal cell carcinoma     Comment:  right sup medial breast 06/10/2017: Basal cell carcinoma     Comment:  left lat nasal bridge inf to medial canthus 03/02/2019: Basal cell carcinoma     Comment:  right distal med calf 05/02/2010: History of dysplastic nevus     Comment:  right sup pubic/mild  Reproductive/Obstetrics                             Anesthesia Physical Anesthesia Plan  ASA: 2  Anesthesia Plan: General   Post-op Pain Management: Minimal or no pain anticipated   Induction: Intravenous  PONV Risk Score and Plan: 3 and Propofol infusion, TIVA and Ondansetron  Airway Management Planned: Nasal Cannula  Additional Equipment: None  Intra-op Plan:   Post-operative Plan:   Informed Consent: I have reviewed the patients History and  Physical, chart, labs and discussed the procedure including the risks, benefits and alternatives for the proposed anesthesia with the patient or authorized representative who has indicated his/her understanding and acceptance.     Dental advisory given  Plan Discussed with: CRNA and Surgeon  Anesthesia Plan Comments: (Discussed risks of anesthesia with patient, including possibility of difficulty with spontaneous ventilation under anesthesia necessitating airway intervention, PONV, and rare risks such as cardiac or respiratory or neurological events, and allergic reactions. Discussed the role of CRNA in patient's perioperative care. Patient understands.)        Anesthesia Quick Evaluation

## 2021-04-16 ENCOUNTER — Encounter: Payer: Self-pay | Admitting: Gastroenterology

## 2021-04-16 LAB — SURGICAL PATHOLOGY

## 2021-04-18 ENCOUNTER — Encounter: Payer: Self-pay | Admitting: Gastroenterology

## 2021-04-19 ENCOUNTER — Other Ambulatory Visit: Payer: Self-pay | Admitting: Internal Medicine

## 2021-04-19 ENCOUNTER — Other Ambulatory Visit: Payer: Self-pay

## 2021-04-19 DIAGNOSIS — R051 Acute cough: Secondary | ICD-10-CM | POA: Diagnosis not present

## 2021-04-19 DIAGNOSIS — R220 Localized swelling, mass and lump, head: Secondary | ICD-10-CM | POA: Diagnosis not present

## 2021-04-19 DIAGNOSIS — B9789 Other viral agents as the cause of diseases classified elsewhere: Secondary | ICD-10-CM | POA: Diagnosis not present

## 2021-04-19 DIAGNOSIS — Z1231 Encounter for screening mammogram for malignant neoplasm of breast: Secondary | ICD-10-CM

## 2021-04-19 DIAGNOSIS — J028 Acute pharyngitis due to other specified organisms: Secondary | ICD-10-CM | POA: Diagnosis not present

## 2021-04-19 DIAGNOSIS — J019 Acute sinusitis, unspecified: Secondary | ICD-10-CM | POA: Diagnosis not present

## 2021-04-19 MED ORDER — PREDNISONE 20 MG PO TABS
ORAL_TABLET | ORAL | 0 refills | Status: DC
  Filled 2021-04-19: qty 9, 6d supply, fill #0

## 2021-04-19 MED ORDER — DOXYCYCLINE HYCLATE 100 MG PO TABS
ORAL_TABLET | ORAL | 0 refills | Status: DC
  Filled 2021-04-19: qty 20, 10d supply, fill #0

## 2021-04-23 ENCOUNTER — Other Ambulatory Visit: Payer: Self-pay

## 2021-04-23 DIAGNOSIS — H401111 Primary open-angle glaucoma, right eye, mild stage: Secondary | ICD-10-CM | POA: Diagnosis not present

## 2021-04-23 MED ORDER — TIMOLOL MALEATE 0.5 % OP SOLN
OPHTHALMIC | 5 refills | Status: DC
  Filled 2021-04-23: qty 5, 25d supply, fill #0

## 2021-04-26 ENCOUNTER — Other Ambulatory Visit: Payer: Self-pay

## 2021-04-26 MED ORDER — PREDNISONE 20 MG PO TABS
ORAL_TABLET | ORAL | 0 refills | Status: DC
  Filled 2021-04-26: qty 5, 5d supply, fill #0

## 2021-05-03 ENCOUNTER — Other Ambulatory Visit: Payer: Self-pay

## 2021-05-03 MED FILL — Fluticasone Propionate Nasal Susp 50 MCG/ACT: NASAL | 90 days supply | Qty: 48 | Fill #2 | Status: AC

## 2021-05-09 ENCOUNTER — Other Ambulatory Visit: Payer: Self-pay

## 2021-05-09 MED ORDER — PAXLOVID (300/100) 20 X 150 MG & 10 X 100MG PO TBPK
ORAL_TABLET | ORAL | 0 refills | Status: DC
  Filled 2021-05-09: qty 30, 5d supply, fill #0

## 2021-05-22 ENCOUNTER — Other Ambulatory Visit: Payer: Self-pay

## 2021-06-08 ENCOUNTER — Other Ambulatory Visit: Payer: Self-pay

## 2021-06-11 ENCOUNTER — Other Ambulatory Visit: Payer: Self-pay

## 2021-06-11 ENCOUNTER — Ambulatory Visit
Admission: RE | Admit: 2021-06-11 | Discharge: 2021-06-11 | Disposition: A | Discharge status: Home / Self Care | Payer: Medicare HMO | Source: Ambulatory Visit | Attending: Internal Medicine | Admitting: Internal Medicine

## 2021-06-11 ENCOUNTER — Other Ambulatory Visit: Payer: Self-pay | Admitting: Unknown Physician Specialty

## 2021-06-11 ENCOUNTER — Ambulatory Visit
Admission: RE | Admit: 2021-06-11 | Discharge: 2021-06-11 | Disposition: A | Discharge status: Home / Self Care | Payer: Medicare HMO | Source: Ambulatory Visit | Attending: Unknown Physician Specialty | Admitting: Unknown Physician Specialty

## 2021-06-11 DIAGNOSIS — Z1231 Encounter for screening mammogram for malignant neoplasm of breast: Secondary | ICD-10-CM | POA: Diagnosis present

## 2021-06-11 DIAGNOSIS — J019 Acute sinusitis, unspecified: Secondary | ICD-10-CM

## 2021-07-24 ENCOUNTER — Ambulatory Visit: Payer: Medicare HMO | Admitting: Dermatology

## 2021-07-24 ENCOUNTER — Other Ambulatory Visit: Payer: Self-pay

## 2021-07-24 DIAGNOSIS — R21 Rash and other nonspecific skin eruption: Secondary | ICD-10-CM | POA: Diagnosis not present

## 2021-07-24 MED ORDER — PREDNISONE 5 MG PO TABS: 5.0000 mg | ORAL_TABLET | Freq: Every day | ORAL | 0 refills | Status: DC

## 2021-07-24 MED ORDER — MOMETASONE FUROATE 0.1 % EX CREA: TOPICAL_CREAM | CUTANEOUS | 0 refills | Status: DC

## 2021-07-24 NOTE — Progress Notes (Signed)
? ?  Follow-Up Visit ?  ?Subjective  ?[name] is a 71 y.o. female who presents for the following: Rash (Patient here today for rash that started about 1 week ago. It has spread from her face down to neck, arms and itches badly. Patient has not used anything new and has not started any new medications. She takes a Zyrtec and uses Flonase daily. Patient went to [facility] and was told she has rosacea. ). She has h/o allergies. ? ?Patient does have history of eczema as a child.  ? ?The following portions of the chart were reviewed this encounter and updated as appropriate:  ?  ?  ? ?Review of Systems:  No other skin or systemic complaints except as noted in HPI or Assessment and Plan. ? ?Objective  ?Well appearing patient in no apparent distress; mood and affect are within normal limits. ? ?A focused examination was performed including face, neck, arms. Relevant physical exam findings are noted in the Assessment and Plan. ? ?face ?Swelling with erythema bilateral cheeks and nose ?Pink scaly patches at chin and neck ? ? ? ?Assessment & Plan  ?Rash ?face ? ?Favor allergic contact dermatitis ? ?Start mometasone cream to affected areas one to two times daily for up to 2 weeks as needed for itch.  ? ?Start prednisone 2 week taper ? ?Risks of prednisone taper discussed including mood irritability, insomnia, weight gain, stomach ulcers, increased risk of infection, increased blood sugar (diabetes), hypertension, osteoporosis with long-term or frequent use, and rare risk of avascular necrosis of the hip.  ? ? ?predniSONE (DELTASONE) 5 MG tablet - face ?Take 1 tablet (5 mg total) by mouth daily with breakfast. Patient given instructions for 2 week taper. ? ?mometasone (ELOCON) 0.1 % cream - face ?Apply to affected areas of rash 1-2 times daily for up to 2 weeks as needed for itch. ? ? ?Return if symptoms worsen or fail to improve. ? ?Graciella Belton, RMA, am acting as scribe for Brendolyn Patty, MD . ? ?Documentation: I  have reviewed the above documentation for accuracy and completeness, and I agree with the above. ? ?Brendolyn Patty MD  ?

## 2021-07-24 NOTE — Patient Instructions (Signed)
2 Week Prednisone Taper ? ?You will be given a prescription for 100 tablets of oral Prednisone. It is very important that you take this according to the exact schedule provided below. This type of regimen for taking medication is often called a "taper", because your dosage will steadily decrease over a two week period until it is discontinued altogether. ? ?ALWAYS take this medicine with food to prevent it from irritating your stomach. You should also take your Prednisone during morning hours. ? ?Call the clinic at [phone] if you gain more than two pounds in one day, notice swelling anywhere on your body, have shortness of breath, black or red bowel movements, brown or red vomitus, desire to drink large amounts of fluids, a fever, or extreme weakness. ? ? ?Oral Prednisone over Two Weeks ? Day  Week 1  Week 2  ? 1  12 tablets  7 tablets  ? 2  12 tablets  6 tablets  ? 3  11 tablets  5 tablets  ? 4  10 tablets  4 tablets  ? 5  10 tablets  3 tablets  ? 6  9 tablets  2 tablets  ? 7  8 tablets  1 tablet  ? ? ?If You Need Anything After Your Visit ? ?If you have any questions or concerns for your doctor, please call our main line at [phone] and press option 4 to reach your doctor's medical assistant. If no one answers, please leave a voicemail as directed and we will return your call as soon as possible. Messages left after 4 pm will be answered the following business day.  ? ?You may also send [place] a message via MyChart. We typically respond to MyChart messages within 1-2 business days. ? ?For prescription refills, please ask your pharmacy to contact our office. Our fax number is [phone]. ? ?If you have an urgent issue when the clinic is closed that cannot wait until the next business day, you can page your doctor at the number below.   ? ?Please note that while we do our best to be available for urgent issues outside of office hours, we are not available 24/7.  ? ?If you have an urgent issue and are unable  to reach [place], you may choose to seek medical care at your doctor's office, retail clinic, urgent care center, or emergency room. ? ?If you have a medical emergency, please immediately call 911 or go to the emergency department. ? ?Pager Numbers ? ?- Dr. Nehemiah Massed: [phone] ? ?- Dr. Laurence Ferrari: [phone] ? ?- Dr. Nicole Kindred: [phone] ? ?In the event of inclement weather, please call our main line at [phone] for an update on the status of any delays or closures. ? ?Dermatology Medication Tips: ?Please keep the boxes that topical medications come in in order to help keep track of the instructions about where and how to use these. Pharmacies typically print the medication instructions only on the boxes and not directly on the medication tubes.  ? ?If your medication is too expensive, please contact our office at [phone] option 4 or send [place] a message through [place].  ? ?We are unable to tell what your co-pay for medications will be in advance as this is different depending on your insurance coverage. However, we may be able to find a substitute medication at lower cost or fill out paperwork to get insurance to cover a needed medication.  ? ?If a prior authorization is required to get your medication covered by your insurance  company, please allow [place] 1-2 business days to complete this process. ? ?Drug prices often vary depending on where the prescription is filled and some pharmacies may offer cheaper prices. ? ?The website www.goodrx.com contains coupons for medications through different pharmacies. The prices here do not account for what the cost may be with help from insurance (it may be cheaper with your insurance), but the website can give you the price if you did not use any insurance.  ?- You can print the associated coupon and take it with your prescription to the pharmacy.  ?- You may also stop by our office during regular business hours and pick up a GoodRx coupon card.  ?- If you need your  prescription sent electronically to a different pharmacy, notify our office through [facility] or by phone at [phone] option 4. ? ? ? ? ?Si Usted Necesita Algo Despu?s de Su Visita ? ?Tambi?n puede enviarnos un mensaje a trav?s de MyChart. Por lo general respondemos a los mensajes de MyChart en el transcurso de 1 a 2 d?as h?biles. ? ?Para renovar recetas, por favor pida a su farmacia que se ponga en contacto con nuestra oficina. Nuestro n?mero de fax es el [phone]. ? ?Si tiene un asunto urgente cuando la cl?nica est? cerrada y que no puede esperar hasta el siguiente d?a h?bil, puede llamar/localizar a su doctor(a) al n?mero que aparece a continuaci?n.  ? ?Por favor, tenga en cuenta que aunque hacemos todo lo posible para estar disponibles para asuntos urgentes fuera del horario de oficina, no estamos disponibles las 24 horas del d?a, los 7 d?as de la semana.  ? ?Si tiene un problema urgente y no puede comunicarse con nosotros, puede optar por buscar atenci?n m?dica  en el consultorio de su doctor(a), en una cl?nica privada, en un centro de atenci?n urgente o en una sala de emergencias. ? ?Si tiene Engineer, maintenance (IT) m?dica, por favor llame inmediatamente al 911 o vaya a la sala de emergencias. ? ?N?meros de b?per ? ?- Dr. Nehemiah Massed: [phone] ? ?- Dra. Moye: [phone] ? ?- Dra. Nicole Kindred: [phone] ? ?En caso de inclemencias del tiempo, por favor llame a nuestra l?nea principal al [phone] para una actualizaci?n sobre el estado de cualquier retraso o cierre. ? ?Consejos para la medicaci?n en dermatolog?a: ?Por favor, guarde las cajas en las que vienen los medicamentos de uso t?pico para ayudarle a seguir las instrucciones sobre d?nde y c?mo usarlos. Las farmacias generalmente imprimen las instrucciones del medicamento s?lo en las cajas y no directamente en los tubos del [place].  ? ?Si su medicamento es muy caro, por favor, p?ngase en contacto con Zigmund Daniel llamando al [phone]  y presione la opci?n 4 o env?enos un mensaje a trav?s de MyChart.  ? ?No podemos decirle cu?l ser? su copago por los medicamentos por adelantado ya que esto es diferente dependiendo de la cobertura de su seguro. Sin embargo, es posible que podamos encontrar un medicamento sustituto a Electrical engineer un formulario para que el seguro cubra el medicamento que se considera necesario.  ? ?Si se requiere Ardelia Mems autorizaci?n previa para que su compa??a de seguros [place] su medicamento, por favor perm?tanos de 1 a 2 d?as h?biles para completar este proceso. ? ?Los precios de los medicamentos var?an con frecuencia dependiendo del Environmental consultant de d?nde se surte la receta y alguna farmacias pueden ofrecer precios m?s baratos. ? ?El sitio web www.goodrx.com tiene cupones para medicamentos de Airline pilot. Los precios aqu? no tienen en cuenta  lo que podr?a costar con la ayuda del seguro (puede ser m?s barato con su seguro), pero el sitio web puede darle el precio si no utiliz? ning?n seguro.  ?- Puede imprimir el cup?n correspondiente y llevarlo con su receta a la farmacia.  ?- Tambi?n puede pasar por nuestra oficina durante el horario de atenci?n regular y recoger una tarjeta de cupones de GoodRx.  ?- Si necesita que su receta se env?e electr?nicamente a Chiropodist, informe a nuestra oficina a trav?s de MyChart de [place] o por tel?fono llamando al [phone] y presione la opci?n 4. ? ?

## 2021-10-16 ENCOUNTER — Ambulatory Visit (INDEPENDENT_AMBULATORY_CARE_PROVIDER_SITE_OTHER): Payer: Self-pay | Admitting: Dermatology

## 2021-10-16 ENCOUNTER — Encounter: Payer: Self-pay | Admitting: Dermatology

## 2021-10-16 DIAGNOSIS — L988 Other specified disorders of the skin and subcutaneous tissue: Secondary | ICD-10-CM

## 2021-10-16 NOTE — Patient Instructions (Signed)
Due to recent changes in healthcare laws, you may see results of your pathology and/or laboratory studies on MyChart before the doctors have had a chance to review them. We understand that in some cases there may be results that are confusing or concerning to you. Please understand that not all results are received at the same time and often the doctors may need to interpret multiple results in order to provide you with the best plan of care or course of treatment. Therefore, we ask that you please give us 2 business days to thoroughly review all your results before contacting the office for clarification. Should we see a critical lab result, you will be contacted sooner.   If You Need Anything After Your Visit  If you have any questions or concerns for your doctor, please call our main line at [phone] and press option 4 to reach your doctor's medical assistant. If no one answers, please leave a voicemail as directed and we will return your call as soon as possible. Messages left after 4 pm will be answered the following business day.   You may also send us a message via MyChart. We typically respond to MyChart messages within 1-2 business days.  For prescription refills, please ask your pharmacy to contact our office. Our fax number is [phone].  If you have an urgent issue when the clinic is closed that cannot wait until the next business day, you can page your doctor at the number below.    Please note that while we do our best to be available for urgent issues outside of office hours, we are not available 24/7.   If you have an urgent issue and are unable to reach us, you may choose to seek medical care at your doctor's office, retail clinic, urgent care center, or emergency room.  If you have a medical emergency, please immediately call 911 or go to the emergency department.  Pager Numbers  - Dr. Kowalski: [phone]  - Dr. Moye: [phone]  - Dr. Stewart:  [phone]  In the event of inclement weather, please call our main line at [phone] for an update on the status of any delays or closures.  Dermatology Medication Tips: Please keep the boxes that topical medications come in in order to help keep track of the instructions about where and how to use these. Pharmacies typically print the medication instructions only on the boxes and not directly on the medication tubes.   If your medication is too expensive, please contact our office at [phone] option 4 or send us a message through MyChart.   We are unable to tell what your co-pay for medications will be in advance as this is different depending on your insurance coverage. However, we may be able to find a substitute medication at lower cost or fill out paperwork to get insurance to cover a needed medication.   If a prior authorization is required to get your medication covered by your insurance company, please allow us 1-2 business days to complete this process.  Drug prices often vary depending on where the prescription is filled and some pharmacies may offer cheaper prices.  The website www.goodrx.com contains coupons for medications through different pharmacies. The prices here do not account for what the cost may be with help from insurance (it may be cheaper with your insurance), but the website can give you the price if you did not use any insurance.  - You can print the associated coupon and take it with   your prescription to the pharmacy.  - You may also stop by our office during regular business hours and pick up a GoodRx coupon card.  - If you need your prescription sent electronically to a different pharmacy, notify our office through [facility] MyChart or by phone at [phone] option 4.     Si Usted Necesita Algo Despus de Su Visita  Tambin puede enviarnos un mensaje a travs de MyChart. Por lo general respondemos a los mensajes de MyChart en el transcurso de 1 a 2  das hbiles.  Para renovar recetas, por favor pida a su farmacia que se ponga en contacto con nuestra oficina. Nuestro nmero de fax es el [phone].  Si tiene un asunto urgente cuando la clnica est cerrada y que no puede esperar hasta el siguiente da hbil, puede llamar/localizar a su doctor(a) al nmero que aparece a continuacin.   Por favor, tenga en cuenta que aunque hacemos todo lo posible para estar disponibles para asuntos urgentes fuera del horario de oficina, no estamos disponibles las 24 horas del da, los 7 das de la semana.   Si tiene un problema urgente y no puede comunicarse con nosotros, puede optar por buscar atencin mdica  en el consultorio de su doctor(a), en una clnica privada, en un centro de atencin urgente o en una sala de emergencias.  Si tiene una emergencia mdica, por favor llame inmediatamente al 911 o vaya a la sala de emergencias.  Nmeros de bper  - Dr. Kowalski: [phone]  - Dra. Moye: [phone]  - Dra. Stewart: [phone]  En caso de inclemencias del tiempo, por favor llame a nuestra lnea principal al [phone] para una actualizacin sobre el estado de cualquier retraso o cierre.  Consejos para la medicacin en dermatologa: Por favor, guarde las cajas en las que vienen los medicamentos de uso tpico para ayudarle a seguir las instrucciones sobre dnde y cmo usarlos. Las farmacias generalmente imprimen las instrucciones del medicamento slo en las cajas y no directamente en los tubos del medicamento.   Si su medicamento es muy caro, por favor, pngase en contacto con nuestra oficina llamando al [phone] y presione la opcin 4 o envenos un mensaje a travs de MyChart.   No podemos decirle cul ser su copago por los medicamentos por adelantado ya que esto es diferente dependiendo de la cobertura de su seguro. Sin embargo, es posible que podamos encontrar un medicamento sustituto a menor costo o llenar un formulario para que el  seguro cubra el medicamento que se considera necesario.   Si se requiere una autorizacin previa para que su compaa de seguros cubra su medicamento, por favor permtanos de 1 a 2 das hbiles para completar este proceso.  Los precios de los medicamentos varan con frecuencia dependiendo del lugar de dnde se surte la receta y alguna farmacias pueden ofrecer precios ms baratos.  El sitio web www.goodrx.com tiene cupones para medicamentos de diferentes farmacias. Los precios aqu no tienen en cuenta lo que podra costar con la ayuda del seguro (puede ser ms barato con su seguro), pero el sitio web puede darle el precio si no utiliz ningn seguro.  - Puede imprimir el cupn correspondiente y llevarlo con su receta a la farmacia.  - Tambin puede pasar por nuestra oficina durante el horario de atencin regular y recoger una tarjeta de cupones de GoodRx.  - Si necesita que su receta se enve electrnicamente a una farmacia diferente, informe a nuestra oficina a travs de MyChart de    o por telfono llamando al [phone] y presione la opcin 4.  

## 2021-10-16 NOTE — Progress Notes (Signed)
   Follow-Up Visit   Subjective  [name] is a 71 y.o. female who presents for the following: Facial Elastosis (Patient is here today for Botox and fillers).  The following portions of the chart were reviewed this encounter and updated as appropriate:   Tobacco  Allergies  Meds  Problems  Med Hx  Surg Hx  Fam Hx     Review of Systems:  No other skin or systemic complaints except as noted in HPI or Assessment and Plan.  Objective  Well appearing patient in no apparent distress; mood and affect are within normal limits.  A focused examination was performed including the face. Relevant physical exam findings are noted in the Assessment and Plan.  Face Rhytides and volume loss.               Assessment & Plan  Elastosis of skin Face  Botox Injection - Face Location: See attached image  Informed consent: Discussed risks (infection, pain, bleeding, bruising, swelling, allergic reaction, paralysis of nearby muscles, eyelid droop, double vision, neck weakness, difficulty breathing, headache, undesirable cosmetic result, and need for additional treatment) and benefits of the procedure, as well as the alternatives.  Informed consent was obtained.  Preparation: The area was cleansed with alcohol.  Procedure Details:  Botox was injected into the dermis with a 30-gauge needle. Pressure applied to any bleeding. Ice packs offered for swelling.  Lot Number:  [id number] Expiration:  08/2023  Total Units Injected:  11.5  ( 7.5 u in Procerus;  4 u in upper lip )  Plan: Patient was instructed to remain upright for 4 hours. Patient was instructed to avoid massaging the face and avoid vigorous exercise for the rest of the day. Tylenol may be used for headache.  Allow 2 weeks before returning to clinic for additional dosing as needed. Patient will call for any problems.  Filling material injection - Face Prior to the procedure, the patient's past medical history, allergies  and the rare but potential risks and complications were reviewed with the patient and a signed consent was obtained. Pre and post-treatment care was discussed and instructions provided.  Risks including vascular occlusion were discussed.   Location: See attached photo  Filler Type: Restylane Refyne  Injections into: nasolabials; oral commissures; lateral chin; anterior chin horizontal crease.  Lot number: [id number]  Expiration date: 09/03/2022  Procedure: The area was prepped thoroughly with Puracyn. After introducing the needle into the desired treatment area, the syringe plunger was drawn back to ensure there was no flash of blood prior to injecting the filler in order to minimize risk of intravascular injection and vascular occlusion. After injection of the filler, the treated areas were cleansed and iced to reduce swelling. Post-treatment instructions were reviewed with the patient.       Patient tolerated the procedure well. The patient will call with any problems, questions or concerns prior to their next appointment.  Return in about 4 months (around 02/15/2022) for Botox injections.  Luther Redo, CMA, am acting as scribe for Sarina Ser, MD . Documentation: I have reviewed the above documentation for accuracy and completeness, and I agree with the above.  Sarina Ser, MD

## 2021-12-11 ENCOUNTER — Ambulatory Visit: Payer: Medicare HMO | Admitting: Dermatology

## 2021-12-11 DIAGNOSIS — R21 Rash and other nonspecific skin eruption: Secondary | ICD-10-CM

## 2021-12-11 NOTE — Patient Instructions (Signed)
Due to recent changes in healthcare laws, you may see results of your pathology and/or laboratory studies on MyChart before the doctors have had a chance to review them. We understand that in some cases there may be results that are confusing or concerning to you. Please understand that not all results are received at the same time and often the doctors may need to interpret multiple results in order to provide you with the best plan of care or course of treatment. Therefore, we ask that you please give us 2 business days to thoroughly review all your results before contacting the office for clarification. Should we see a critical lab result, you will be contacted sooner.   If You Need Anything After Your Visit  If you have any questions or concerns for your doctor, please call our main line at [phone] and press option 4 to reach your doctor's medical assistant. If no one answers, please leave a voicemail as directed and we will return your call as soon as possible. Messages left after 4 pm will be answered the following business day.   You may also send us a message via MyChart. We typically respond to MyChart messages within 1-2 business days.  For prescription refills, please ask your pharmacy to contact our office. Our fax number is [phone].  If you have an urgent issue when the clinic is closed that cannot wait until the next business day, you can page your doctor at the number below.    Please note that while we do our best to be available for urgent issues outside of office hours, we are not available 24/7.   If you have an urgent issue and are unable to reach us, you may choose to seek medical care at your doctor's office, retail clinic, urgent care center, or emergency room.  If you have a medical emergency, please immediately call 911 or go to the emergency department.  Pager Numbers  - Dr. Kowalski: [phone]  - Dr. Moye: [phone]  - Dr. Stewart:  [phone]  In the event of inclement weather, please call our main line at [phone] for an update on the status of any delays or closures.  Dermatology Medication Tips: Please keep the boxes that topical medications come in in order to help keep track of the instructions about where and how to use these. Pharmacies typically print the medication instructions only on the boxes and not directly on the medication tubes.   If your medication is too expensive, please contact our office at [phone] option 4 or send us a message through MyChart.   We are unable to tell what your co-pay for medications will be in advance as this is different depending on your insurance coverage. However, we may be able to find a substitute medication at lower cost or fill out paperwork to get insurance to cover a needed medication.   If a prior authorization is required to get your medication covered by your insurance company, please allow us 1-2 business days to complete this process.  Drug prices often vary depending on where the prescription is filled and some pharmacies may offer cheaper prices.  The website www.goodrx.com contains coupons for medications through different pharmacies. The prices here do not account for what the cost may be with help from insurance (it may be cheaper with your insurance), but the website can give you the price if you did not use any insurance.  - You can print the associated coupon and take it with   your prescription to the pharmacy.  - You may also stop by our office during regular business hours and pick up a GoodRx coupon card.  - If you need your prescription sent electronically to a different pharmacy, notify our office through [place] MyChart or by phone at [phone] option 4.     Si Usted Necesita Algo Despus de Su Visita  Tambin puede enviarnos un mensaje a travs de MyChart. Por lo general respondemos a los mensajes de MyChart en el transcurso de 1 a 2  das hbiles.  Para renovar recetas, por favor pida a su farmacia que se ponga en contacto con nuestra oficina. Nuestro nmero de fax es el [phone].  Si tiene un asunto urgente cuando la clnica est cerrada y que no puede esperar hasta el siguiente da hbil, puede llamar/localizar a su doctor(a) al nmero que aparece a continuacin.   Por favor, tenga en cuenta que aunque hacemos todo lo posible para estar disponibles para asuntos urgentes fuera del horario de oficina, no estamos disponibles las 24 horas del da, los 7 das de la semana.   Si tiene un problema urgente y no puede comunicarse con nosotros, puede optar por buscar atencin mdica  en el consultorio de su doctor(a), en una clnica privada, en un centro de atencin urgente o en una sala de emergencias.  Si tiene una emergencia mdica, por favor llame inmediatamente al 911 o vaya a la sala de emergencias.  Nmeros de bper  - Dr. Kowalski: [phone]  - Dra. Moye: [phone]  - Dra. Stewart: [phone]  En caso de inclemencias del tiempo, por favor llame a nuestra lnea principal al [phone] para una actualizacin sobre el estado de cualquier retraso o cierre.  Consejos para la medicacin en dermatologa: Por favor, guarde las cajas en las que vienen los medicamentos de uso tpico para ayudarle a seguir las instrucciones sobre dnde y cmo usarlos. Las farmacias generalmente imprimen las instrucciones del medicamento slo en las cajas y no directamente en los tubos del medicamento.   Si su medicamento es muy caro, por favor, pngase en contacto con nuestra oficina llamando al [phone] y presione la opcin 4 o envenos un mensaje a travs de MyChart.   No podemos decirle cul ser su copago por los medicamentos por adelantado ya que esto es diferente dependiendo de la cobertura de su seguro. Sin embargo, es posible que podamos encontrar un medicamento sustituto a menor costo o llenar un formulario para que el  seguro cubra el medicamento que se considera necesario.   Si se requiere una autorizacin previa para que su compaa de seguros cubra su medicamento, por favor permtanos de 1 a 2 das hbiles para completar este proceso.  Los precios de los medicamentos varan con frecuencia dependiendo del lugar de dnde se surte la receta y alguna farmacias pueden ofrecer precios ms baratos.  El sitio web www.goodrx.com tiene cupones para medicamentos de diferentes farmacias. Los precios aqu no tienen en cuenta lo que podra costar con la ayuda del seguro (puede ser ms barato con su seguro), pero el sitio web puede darle el precio si no utiliz ningn seguro.  - Puede imprimir el cupn correspondiente y llevarlo con su receta a la farmacia.  - Tambin puede pasar por nuestra oficina durante el horario de atencin regular y recoger una tarjeta de cupones de GoodRx.  - Si necesita que su receta se enve electrnicamente a una farmacia diferente, informe a nuestra oficina a travs de MyChart de [place]   o por telfono llamando al [phone] y presione la opcin 4.  

## 2021-12-11 NOTE — Progress Notes (Signed)
   Follow-Up Visit   Subjective  [name] is a 71 y.o. female who presents for the following: Rash (Patient here today for swelling and redness at the face. About 2 weeks ago someone had a diffuser in the office and that's when patient think this started. Patient has used Flonase and Afrin. Last Wednesday patient saw Dr. Caryl Comes and he brought in Dr. Sabra Heck and they seem to think she is having a reaction to something. She was given 6 days of prednisone and amoxicillin. Patient has had allergy prick testing in the past with no findings and is scheduled with ENT 12/31/21.). She was seen here in March 2023 for similar facial rash and was given 2 weeks of prednisone and mometasone cream.  She has sinus problems in the past and takes flonase and Zyrtec daily.  Patient did have a very bad reaction to ant bites at feet about 3 weeks ago and had swelling around her ankle.   The following portions of the chart were reviewed this encounter and updated as appropriate:       Review of Systems:  No other skin or systemic complaints except as noted in HPI or Assessment and Plan.  Objective  Well appearing patient in no apparent distress; mood and affect are within normal limits.  A focused examination was performed including face. Relevant physical exam findings are noted in the Assessment and Plan.  face Edema at nose, malar cheeks, upper lip with mild erythema           Assessment & Plan  Rash face  Possible allergic contact dermatitis to aerosolized fragrance  Patient's last dose of prednisone was this morning. Patient advised that could affect results of patch testing but we will proceed with application.  Patient advised not to take Zyrtec while doing patch testing.   Related Procedures Patch Test  Related Medications predniSONE (DELTASONE) 5 MG tablet Take 1 tablet (5 mg total) by mouth daily with breakfast. Patient given instructions for 2 week taper.  mometasone (ELOCON)  0.1 % cream Apply to affected areas of rash 1-2 times daily for up to 2 weeks as needed for itch.   Return for 2 days with nurse, 1 week with Dr. Nicole Kindred for patch testing.  Graciella Belton, RMA, am acting as scribe for Brendolyn Patty, MD .  Documentation: I have reviewed the above documentation for accuracy and completeness, and I agree with the above.  Brendolyn Patty MD

## 2021-12-12 ENCOUNTER — Ambulatory Visit: Payer: Medicare HMO | Admitting: Dermatology

## 2021-12-12 ENCOUNTER — Other Ambulatory Visit: Payer: Self-pay | Admitting: Internal Medicine

## 2021-12-13 ENCOUNTER — Ambulatory Visit (INDEPENDENT_AMBULATORY_CARE_PROVIDER_SITE_OTHER): Payer: Medicare HMO | Admitting: Dermatology

## 2021-12-13 ENCOUNTER — Other Ambulatory Visit: Payer: Self-pay | Admitting: Internal Medicine

## 2021-12-13 DIAGNOSIS — L309 Dermatitis, unspecified: Secondary | ICD-10-CM

## 2021-12-13 DIAGNOSIS — J3489 Other specified disorders of nose and nasal sinuses: Secondary | ICD-10-CM

## 2021-12-13 DIAGNOSIS — R220 Localized swelling, mass and lump, head: Secondary | ICD-10-CM

## 2021-12-13 NOTE — Progress Notes (Signed)
Patient here today for 48 hour True Test reading. Panels 1-3 were removed and no visible reaction is seen at this time. Patient advised on how to read test each day Friday-Tuesday when she comes back for her final reading. Patient advised that it is okay to shower but to not soak or scrub. All questions and concerns answered.   Dicie Beam RMA  Documentation: I have reviewed the above documentation for accuracy and completeness, and I agree with the above.  Brendolyn Patty MD

## 2021-12-17 ENCOUNTER — Ambulatory Visit
Admission: RE | Admit: 2021-12-17 | Discharge: 2021-12-17 | Disposition: A | Discharge status: Home / Self Care | Payer: Medicare HMO | Source: Ambulatory Visit | Attending: Internal Medicine | Admitting: Internal Medicine

## 2021-12-17 DIAGNOSIS — R220 Localized swelling, mass and lump, head: Secondary | ICD-10-CM

## 2021-12-17 DIAGNOSIS — J3489 Other specified disorders of nose and nasal sinuses: Secondary | ICD-10-CM

## 2021-12-18 ENCOUNTER — Ambulatory Visit: Payer: Medicare HMO | Admitting: Dermatology

## 2021-12-18 ENCOUNTER — Encounter: Payer: Self-pay | Admitting: Dermatology

## 2021-12-18 DIAGNOSIS — R21 Rash and other nonspecific skin eruption: Secondary | ICD-10-CM | POA: Diagnosis not present

## 2021-12-18 MED ORDER — HYDROXYZINE HCL 10 MG PO TABS: ORAL_TABLET | ORAL | 2 refills | Status: DC

## 2021-12-18 NOTE — Progress Notes (Signed)
   Follow-Up Visit   Subjective  [name] is a 71 y.o. female who presents for the following: Patch Testing (Here for day 8 patch test reading. 2nd reading. Has had allergy testing done in the past, never had any reactions. Takes Zyrtec every morning, uses Flonase daily. Face has flared up since last visit. Did not have swelling of throat ). This is second time this year she has had this problem.  No new products, foods, or medications.  Was exposed to diffuser with fragrance prior to 2nd facial swelling onset. Was on prednisone but stopped it a week ago.    The following portions of the chart were reviewed this encounter and updated as appropriate:      Review of Systems: No other skin or systemic complaints except as noted in HPI or Assessment and Plan.   Objective  Well appearing patient in no apparent distress; mood and affect are within normal limits.  A focused examination was performed including back, face. Relevant physical exam findings are noted in the Assessment and Plan.  Head - Anterior (Face) Moderate reactions to #3: Neomycin, #28: Gold, #33: Bacitracin. decreased edema at cheeks and nose today   Assessment & Plan  Rash Head - Anterior (Face)  Possible allergic reaction to aerosolized fragrance.  Patch test positive for Neosproin, Bacitracin, Gold, but none of these seem relevant to recent facial swelling   Start Hydroxyzine 10 mg take 1 to 3 tablets at bedtime for itching.  Continue Zyrtec 10 mg every morning.  Avoid Neosporin, Polysporin, Bacitracin. Avoid wearing gold jewelry.  Pt has upcoming appt with ENT. Pt will RTC if flares.  hydrOXYzine (ATARAX) 10 MG tablet - Head - Anterior (Face) Take 1 to 3 tablets every night at bedtime  Related Medications mometasone (ELOCON) 0.1 % cream Apply to affected areas of rash 1-2 times daily for up to 2 weeks as needed for itch.   Return if symptoms worsen or fail to improve.  I, Emelia Salisbury, CMA, am  acting as scribe for Brendolyn Patty, MD.  Documentation: I have reviewed the above documentation for accuracy and completeness, and I agree with the above.  Brendolyn Patty MD

## 2021-12-18 NOTE — Patient Instructions (Addendum)
Start Hydroxyzine 10 mg take 1 to 3 tablets at bedtime for itching.  Continue Zyrtec 10 mg every morning.  Avoid Neosporin, Polysporin, Bacitracin. Avoid wearing gold jewelry.   Gentle Skin Care Guide  1. Bathe no more than once a day.  2. Avoid bathing in hot water  3. Use a mild soap like Dove, Vanicream, Cetaphil, CeraVe. Can use Lever 2000 or Cetaphil antibacterial soap  4. Use soap only where you need it. On most days, use it under your arms, between your legs, and on your feet. Let the water rinse other areas unless visibly dirty.  5. When you get out of the bath/shower, use a towel to gently blot your skin dry, don't rub it.  6. While your skin is still a little damp, apply a moisturizing cream such as Vanicream, CeraVe, Cetaphil, Eucerin, Sarna lotion or plain Vaseline Jelly. For hands apply Neutrogena [place] Hand Cream or Excipial Hand Cream.  7. Reapply moisturizer any time you start to itch or feel dry.  8. Sometimes using free and clear laundry detergents can be helpful. Fabric softener sheets should be avoided. Downy Free & Gentle liquid, or any liquid fabric softener that is free of dyes and perfumes, it acceptable to use  9. If your doctor has given you prescription creams you may apply moisturizers over them      Due to recent changes in healthcare laws, you may see results of your pathology and/or laboratory studies on MyChart before the doctors have had a chance to review them. We understand that in some cases there may be results that are confusing or concerning to you. Please understand that not all results are received at the same time and often the doctors may need to interpret multiple results in order to provide you with the best plan of care or course of treatment. Therefore, we ask that you please give [place] 2 business days to thoroughly review all your results before contacting the office for clarification. Should we see a critical lab result, you will be  contacted sooner.   If You Need Anything After Your Visit  If you have any questions or concerns for your doctor, please call our main line at [phone] and press option 4 to reach your doctor's medical assistant. If no one answers, please leave a voicemail as directed and we will return your call as soon as possible. Messages left after 4 pm will be answered the following business day.   You may also send [place] a message via [place]. We typically respond to MyChart messages within 1-2 business days.  For prescription refills, please ask your pharmacy to contact our office. Our fax number is [phone].  If you have an urgent issue when the clinic is closed that cannot wait until the next business day, you can page your doctor at the number below.    Please note that while we do our best to be available for urgent issues outside of office hours, we are not available 24/7.   If you have an urgent issue and are unable to reach [place], you may choose to seek medical care at your doctor's office, retail clinic, urgent care center, or emergency room.  If you have a medical emergency, please immediately call 911 or go to the emergency department.  Pager Numbers  - Dr. Nehemiah Massed: [phone]  - Dr. Laurence Ferrari: [phone]  - Dr. Nicole Kindred: [phone]  In the event of inclement weather, please call our main line at [phone] for an update  on the status of any delays or closures.  Dermatology Medication Tips: Please keep the boxes that topical medications come in in order to help keep track of the instructions about where and how to use these. Pharmacies typically print the medication instructions only on the boxes and not directly on the medication tubes.   If your medication is too expensive, please contact our office at [phone] option 4 or send [place] a message through [place].   We are unable to tell what your co-pay for medications will be in advance as this is different depending on your  insurance coverage. However, we may be able to find a substitute medication at lower cost or fill out paperwork to get insurance to cover a needed medication.   If a prior authorization is required to get your medication covered by your insurance company, please allow [place] 1-2 business days to complete this process.  Drug prices often vary depending on where the prescription is filled and some pharmacies may offer cheaper prices.  The website www.goodrx.com contains coupons for medications through different pharmacies. The prices here do not account for what the cost may be with help from insurance (it may be cheaper with your insurance), but the website can give you the price if you did not use any insurance.  - You can print the associated coupon and take it with your prescription to the pharmacy.  - You may also stop by our office during regular business hours and pick up a GoodRx coupon card.  - If you need your prescription sent electronically to a different pharmacy, notify our office through [facility] or by phone at [phone] option 4.     Si Usted Necesita Algo Despus de Su Visita  Tambin puede enviarnos un mensaje a travs de Pharmacist, community. Por lo general respondemos a los mensajes de MyChart en el transcurso de 1 a 2 das hbiles.  Para renovar recetas, por favor pida a su farmacia que se ponga en contacto con nuestra oficina. Harland Dingwall de fax es [place] [phone].  Si tiene un asunto urgente cuando la clnica est cerrada y que no puede esperar hasta el siguiente da hbil, puede llamar/localizar a su doctor(a) al nmero que aparece a continuacin.   Por favor, tenga en cuenta que aunque hacemos todo lo posible para estar disponibles para asuntos urgentes fuera del horario de [place], no estamos disponibles las 24 horas del da, los 7 das de la [place].   Si tiene un problema urgente y no puede comunicarse con nosotros, puede optar por buscar atencin mdica  en el  consultorio de su doctor(a), en una clnica privada, en un centro de atencin urgente o en una sala de emergencias.  Si tiene Engineering geologist, por favor llame inmediatamente al 911 o vaya a la sala de emergencias.  Nmeros de bper  - Dr. Nehemiah Massed: [phone]  - Dra. Moye: [phone]  - Dra. Nicole Kindred: [phone]  En caso de inclemencias del [place], por favor llame a Johnsie Kindred principal al [phone] para una actualizacin sobre el [place] de cualquier retraso o cierre.  Consejos para la medicacin en dermatologa: Por favor, guarde las cajas en las que vienen los medicamentos de uso tpico para ayudarle a seguir las instrucciones sobre dnde y cmo usarlos. Las farmacias generalmente imprimen las instrucciones del medicamento slo en las cajas y no directamente en los tubos del [place].   Si su medicamento es muy caro, por favor, pngase en contacto con nuestra oficina llamando al  [phone] y presione la opcin 4 o envenos un mensaje a travs de Pharmacist, community.   No podemos decirle cul ser su copago por los medicamentos por adelantado ya que esto es diferente dependiendo de la cobertura de su seguro. Sin embargo, es posible que podamos encontrar un medicamento sustituto a Electrical engineer un formulario para que el seguro cubra el medicamento que se considera necesario.   Si se requiere una autorizacin previa para que su compaa de seguros [place] su medicamento, por favor permtanos de 1 a 2 das hbiles para completar este proceso.  Los precios de los medicamentos varan con frecuencia dependiendo del Environmental consultant de dnde se surte la receta y alguna farmacias pueden ofrecer precios ms baratos.  El sitio web www.goodrx.com tiene cupones para medicamentos de Airline pilot. Los precios aqu no tienen en cuenta lo que podra costar con la ayuda del seguro (puede ser ms barato con su seguro), pero el sitio web puede darle el precio si no utiliz Research scientist (physical sciences).  - Puede  imprimir el cupn correspondiente y llevarlo con su receta a la farmacia.  - Tambin puede pasar por nuestra oficina durante el horario de atencin regular y Charity fundraiser una tarjeta de cupones de GoodRx.  - Si necesita que su receta se enve electrnicamente a una farmacia diferente, informe a nuestra oficina a travs de MyChart de [place] o por telfono llamando al [phone] y presione la opcin 4.

## 2021-12-25 ENCOUNTER — Ambulatory Visit (INDEPENDENT_AMBULATORY_CARE_PROVIDER_SITE_OTHER): Payer: Medicare HMO | Admitting: Dermatology

## 2021-12-25 DIAGNOSIS — S0086XA Insect bite (nonvenomous) of other part of head, initial encounter: Secondary | ICD-10-CM

## 2021-12-25 DIAGNOSIS — W57XXXA Bitten or stung by nonvenomous insect and other nonvenomous arthropods, initial encounter: Secondary | ICD-10-CM

## 2021-12-25 DIAGNOSIS — L988 Other specified disorders of the skin and subcutaneous tissue: Secondary | ICD-10-CM

## 2021-12-25 MED ORDER — MOMETASONE FUROATE 0.1 % EX CREA: 1.0000 | TOPICAL_CREAM | Freq: Every day | CUTANEOUS | 0 refills | Status: DC | PRN

## 2021-12-25 NOTE — Progress Notes (Signed)
Follow-Up Visit   Subjective  [name] is a 71 y.o. female who presents for the following: Facial Elastosis (Face, pt presents for fillers today) and bites (Forehead, ~4-5 days, itchy, pt picks at).  The following portions of the chart were reviewed this encounter and updated as appropriate:   Tobacco  Allergies  Meds  Problems  Med Hx  Surg Hx  Fam Hx     Review of Systems:  No other skin or systemic complaints except as noted in HPI or Assessment and Plan.  Objective  Well appearing patient in no apparent distress; mood and affect are within normal limits.  A focused examination was performed including face. Relevant physical exam findings are noted in the Assessment and Plan.  face Rhytides and volume loss.                                    glabella, R brow Pink paps   Assessment & Plan  Elastosis of skin face  Botox 4 units injected today to: - Upper lip x 4 units  Restylane Refyne 1cc injected today to: - [name] bil - Nasolabials bil - [name] bil - Anterior chin crease  Botox Injection - face Location: upper lip  Informed consent: Discussed risks (infection, pain, bleeding, bruising, swelling, allergic reaction, paralysis of nearby muscles, eyelid droop, double vision, neck weakness, difficulty breathing, headache, undesirable cosmetic result, and need for additional treatment) and benefits of the procedure, as well as the alternatives.  Informed consent was obtained.  Preparation: The area was cleansed with alcohol.  Procedure Details:  Botox was injected into the dermis with a 30-gauge needle. Pressure applied to any bleeding. Ice packs offered for swelling.  Lot Number:  [id number] Expiration:  11/25  Total Units Injected:  4  Plan: Patient was instructed to remain upright for 4 hours. Patient was instructed to avoid massaging the face and avoid vigorous exercise for the rest of the day.  Tylenol may be used for headache.  Allow 2 weeks before returning to clinic for additional dosing as needed. Patient will call for any problems.   Filling material injection - face Prior to the procedure, the patient's past medical history, allergies and the rare but potential risks and complications were reviewed with the patient and a signed consent was obtained. Pre and post-treatment care was discussed and instructions provided.  Location: nasolabial folds, oral commissures, marionette lines, and anterior chin crease  Filler Type: Restylane Refyne 1cc  Procedure: The area was prepped thoroughly with Puracyn. After introducing the needle into the desired treatment area, the syringe plunger was drawn back to ensure there was no flash of blood prior to injecting the filler in order to minimize risk of intravascular injection and vascular occlusion. After injection of the filler, the treated areas were cleansed and iced to reduce swelling. Post-treatment instructions were reviewed with the patient.       Patient tolerated the procedure well. The patient will call with any problems, questions or concerns prior to their next appointment.  Lot # [id number] exp 11/03/22   Bug bite without infection, initial encounter glabella, R brow  Start Mometasone cr qd to bid up to 5 days a week until bites resolved  Topical steroids (such as triamcinolone, fluocinolone, fluocinonide, mometasone, clobetasol, halobetasol, betamethasone, hydrocortisone) can cause thinning and lightening of the skin if they are used for too long in the same  area. Your physician has selected the right strength medicine for your problem and area affected on the body. Please use your medication only as directed by your physician to prevent side effects.    mometasone (ELOCON) 0.1 % cream - glabella, R brow Apply 1 Application topically daily as needed (Rash). Qd to bid up to 5 days a week until bites cleared   Return if symptoms  worsen or fail to improve.  I, Othelia Pulling, RMA, am acting as scribe for Sarina Ser, MD . Documentation: I have reviewed the above documentation for accuracy and completeness, and I agree with the above.  Sarina Ser, MD

## 2021-12-25 NOTE — Patient Instructions (Signed)
Due to recent changes in healthcare laws, you may see results of your pathology and/or laboratory studies on MyChart before the doctors have had a chance to review them. We understand that in some cases there may be results that are confusing or concerning to you. Please understand that not all results are received at the same time and often the doctors may need to interpret multiple results in order to provide you with the best plan of care or course of treatment. Therefore, we ask that you please give us 2 business days to thoroughly review all your results before contacting the office for clarification. Should we see a critical lab result, you will be contacted sooner.   If You Need Anything After Your Visit  If you have any questions or concerns for your doctor, please call our main line at [phone] and press option 4 to reach your doctor's medical assistant. If no one answers, please leave a voicemail as directed and we will return your call as soon as possible. Messages left after 4 pm will be answered the following business day.   You may also send us a message via MyChart. We typically respond to MyChart messages within 1-2 business days.  For prescription refills, please ask your pharmacy to contact our office. Our fax number is [phone].  If you have an urgent issue when the clinic is closed that cannot wait until the next business day, you can page your doctor at the number below.    Please note that while we do our best to be available for urgent issues outside of office hours, we are not available 24/7.   If you have an urgent issue and are unable to reach us, you may choose to seek medical care at your doctor's office, retail clinic, urgent care center, or emergency room.  If you have a medical emergency, please immediately call 911 or go to the emergency department.  Pager Numbers  - Dr. Kowalski: [phone]  - Dr. Moye: [phone]  - Dr. Stewart:  [phone]  In the event of inclement weather, please call our main line at [phone] for an update on the status of any delays or closures.  Dermatology Medication Tips: Please keep the boxes that topical medications come in in order to help keep track of the instructions about where and how to use these. Pharmacies typically print the medication instructions only on the boxes and not directly on the medication tubes.   If your medication is too expensive, please contact our office at [phone] option 4 or send us a message through MyChart.   We are unable to tell what your co-pay for medications will be in advance as this is different depending on your insurance coverage. However, we may be able to find a substitute medication at lower cost or fill out paperwork to get insurance to cover a needed medication.   If a prior authorization is required to get your medication covered by your insurance company, please allow us 1-2 business days to complete this process.  Drug prices often vary depending on where the prescription is filled and some pharmacies may offer cheaper prices.  The website www.goodrx.com contains coupons for medications through different pharmacies. The prices here do not account for what the cost may be with help from insurance (it may be cheaper with your insurance), but the website can give you the price if you did not use any insurance.  - You can print the associated coupon and take it with   your prescription to the pharmacy.  - You may also stop by our office during regular business hours and pick up a GoodRx coupon card.  - If you need your prescription sent electronically to a different pharmacy, notify our office through [place] MyChart or by phone at [phone] option 4.     Si Usted Necesita Algo Despus de Su Visita  Tambin puede enviarnos un mensaje a travs de MyChart. Por lo general respondemos a los mensajes de MyChart en el transcurso de 1 a 2  das hbiles.  Para renovar recetas, por favor pida a su farmacia que se ponga en contacto con nuestra oficina. Nuestro nmero de fax es el [phone].  Si tiene un asunto urgente cuando la clnica est cerrada y que no puede esperar hasta el siguiente da hbil, puede llamar/localizar a su doctor(a) al nmero que aparece a continuacin.   Por favor, tenga en cuenta que aunque hacemos todo lo posible para estar disponibles para asuntos urgentes fuera del horario de oficina, no estamos disponibles las 24 horas del da, los 7 das de la semana.   Si tiene un problema urgente y no puede comunicarse con nosotros, puede optar por buscar atencin mdica  en el consultorio de su doctor(a), en una clnica privada, en un centro de atencin urgente o en una sala de emergencias.  Si tiene una emergencia mdica, por favor llame inmediatamente al 911 o vaya a la sala de emergencias.  Nmeros de bper  - Dr. Kowalski: [phone]  - Dra. Moye: [phone]  - Dra. Stewart: [phone]  En caso de inclemencias del tiempo, por favor llame a nuestra lnea principal al [phone] para una actualizacin sobre el estado de cualquier retraso o cierre.  Consejos para la medicacin en dermatologa: Por favor, guarde las cajas en las que vienen los medicamentos de uso tpico para ayudarle a seguir las instrucciones sobre dnde y cmo usarlos. Las farmacias generalmente imprimen las instrucciones del medicamento slo en las cajas y no directamente en los tubos del medicamento.   Si su medicamento es muy caro, por favor, pngase en contacto con nuestra oficina llamando al [phone] y presione la opcin 4 o envenos un mensaje a travs de MyChart.   No podemos decirle cul ser su copago por los medicamentos por adelantado ya que esto es diferente dependiendo de la cobertura de su seguro. Sin embargo, es posible que podamos encontrar un medicamento sustituto a menor costo o llenar un formulario para que el  seguro cubra el medicamento que se considera necesario.   Si se requiere una autorizacin previa para que su compaa de seguros cubra su medicamento, por favor permtanos de 1 a 2 das hbiles para completar este proceso.  Los precios de los medicamentos varan con frecuencia dependiendo del lugar de dnde se surte la receta y alguna farmacias pueden ofrecer precios ms baratos.  El sitio web www.goodrx.com tiene cupones para medicamentos de diferentes farmacias. Los precios aqu no tienen en cuenta lo que podra costar con la ayuda del seguro (puede ser ms barato con su seguro), pero el sitio web puede darle el precio si no utiliz ningn seguro.  - Puede imprimir el cupn correspondiente y llevarlo con su receta a la farmacia.  - Tambin puede pasar por nuestra oficina durante el horario de atencin regular y recoger una tarjeta de cupones de GoodRx.  - Si necesita que su receta se enve electrnicamente a una farmacia diferente, informe a nuestra oficina a travs de MyChart de [place]   o por telfono llamando al [phone] y presione la opcin 4.  

## 2021-12-28 ENCOUNTER — Encounter: Payer: Self-pay | Admitting: Dermatology

## 2022-01-28 ENCOUNTER — Ambulatory Visit: Payer: Medicare HMO | Admitting: Dermatology

## 2022-01-28 DIAGNOSIS — L03114 Cellulitis of left upper limb: Secondary | ICD-10-CM | POA: Diagnosis not present

## 2022-01-28 DIAGNOSIS — L239 Allergic contact dermatitis, unspecified cause: Secondary | ICD-10-CM

## 2022-01-28 DIAGNOSIS — L237 Allergic contact dermatitis due to plants, except food: Secondary | ICD-10-CM | POA: Diagnosis not present

## 2022-01-28 MED ORDER — DOXYCYCLINE HYCLATE 100 MG PO TABS: 100.0000 mg | ORAL_TABLET | Freq: Two times a day (BID) | ORAL | 0 refills | Status: AC

## 2022-01-28 MED ORDER — MUPIROCIN 2 % EX OINT: 1.0000 | TOPICAL_OINTMENT | Freq: Two times a day (BID) | CUTANEOUS | 0 refills | Status: DC

## 2022-01-28 NOTE — Patient Instructions (Addendum)
Mometasone cream to affected areas of right hand/arm. Call Thursday if left arm is not improving.   Due to recent changes in healthcare laws, you may see results of your pathology and/or laboratory studies on MyChart before the doctors have had a chance to review them. We understand that in some cases there may be results that are confusing or concerning to you. Please understand that not all results are received at the same time and often the doctors may need to interpret multiple results in order to provide you with the best plan of care or course of treatment. Therefore, we ask that you please give [place] 2 business days to thoroughly review all your results before contacting the office for clarification. Should we see a critical lab result, you will be contacted sooner.   If You Need Anything After Your Visit  If you have any questions or concerns for your doctor, please call our main line at [phone] and press option 4 to reach your doctor's medical assistant. If no one answers, please leave a voicemail as directed and we will return your call as soon as possible. Messages left after 4 pm will be answered the following business day.   You may also send [place] a message via [place]. We typically respond to MyChart messages within 1-2 business days.  For prescription refills, please ask your pharmacy to contact our office. Our fax number is [phone].  If you have an urgent issue when the clinic is closed that cannot wait until the next business day, you can page your doctor at the number below.    Please note that while we do our best to be available for urgent issues outside of office hours, we are not available 24/7.   If you have an urgent issue and are unable to reach [place], you may choose to seek medical care at your doctor's office, retail clinic, urgent care center, or emergency room.  If you have a medical emergency, please immediately call 911 or go to the emergency department.  Pager  Numbers  - Dr. Nehemiah Massed: [phone]  - Dr. Laurence Ferrari: [phone]  - Dr. Nicole Kindred: [phone]  In the event of inclement weather, please call our main line at [phone] for an update on the status of any delays or closures.  Dermatology Medication Tips: Please keep the boxes that topical medications come in in order to help keep track of the instructions about where and how to use these. Pharmacies typically print the medication instructions only on the boxes and not directly on the medication tubes.   If your medication is too expensive, please contact our office at [phone] option 4 or send [place] a message through [place].   We are unable to tell what your co-pay for medications will be in advance as this is different depending on your insurance coverage. However, we may be able to find a substitute medication at lower cost or fill out paperwork to get insurance to cover a needed medication.   If a prior authorization is required to get your medication covered by your insurance company, please allow [place] 1-2 business days to complete this process.  Drug prices often vary depending on where the prescription is filled and some pharmacies may offer cheaper prices.  The website www.goodrx.com contains coupons for medications through different pharmacies. The prices here do not account for what the cost may be with help from insurance (it may be cheaper with your insurance), but the website can give you the price if  you did not use any insurance.  - You can print the associated coupon and take it with your prescription to the pharmacy.  - You may also stop by our office during regular business hours and pick up a GoodRx coupon card.  - If you need your prescription sent electronically to a different pharmacy, notify our office through [facility] or by phone at [phone] option 4.     Si Usted Necesita Algo Despus de Su Visita  Tambin puede enviarnos un mensaje a travs de  Pharmacist, community. Por lo general respondemos a los mensajes de MyChart en el transcurso de 1 a 2 das hbiles.  Para renovar recetas, por favor pida a su farmacia que se ponga en contacto con nuestra oficina. Harland Dingwall de fax es [place] [phone].  Si tiene un asunto urgente cuando la clnica est cerrada y que no puede esperar hasta el siguiente da hbil, puede llamar/localizar a su doctor(a) al nmero que aparece a continuacin.   Por favor, tenga en cuenta que aunque hacemos todo lo posible para estar disponibles para asuntos urgentes fuera del horario de [place], no estamos disponibles las 24 horas del da, los 7 das de la [place].   Si tiene un problema urgente y no puede comunicarse con nosotros, puede optar por buscar atencin mdica  en el consultorio de su doctor(a), en una clnica privada, en un centro de atencin urgente o en una sala de emergencias.  Si tiene Engineering geologist, por favor llame inmediatamente al 911 o vaya a la sala de emergencias.  Nmeros de bper  - Dr. Nehemiah Massed: [phone]  - Dra. Moye: [phone]  - Dra. Nicole Kindred: [phone]  En caso de inclemencias del [place], por favor llame a Johnsie Kindred principal al [phone] para una actualizacin sobre el [place] de cualquier retraso o cierre.  Consejos para la medicacin en dermatologa: Por favor, guarde las cajas en las que vienen los medicamentos de uso tpico para ayudarle a seguir las instrucciones sobre dnde y cmo usarlos. Las farmacias generalmente imprimen las instrucciones del medicamento slo en las cajas y no directamente en los tubos del [place].   Si su medicamento es muy caro, por favor, pngase en contacto con Zigmund Daniel llamando al [phone] y presione la opcin 4 o envenos un mensaje a travs de Pharmacist, community.   No podemos decirle cul ser su copago por los medicamentos por adelantado ya que esto es diferente dependiendo de la cobertura de su seguro. Sin embargo, es posible que  podamos encontrar un medicamento sustituto a Electrical engineer un formulario para que el seguro cubra el medicamento que se considera necesario.   Si se requiere una autorizacin previa para que su compaa de seguros [place] su medicamento, por favor permtanos de 1 a 2 das hbiles para completar este proceso.  Los precios de los medicamentos varan con frecuencia dependiendo del Environmental consultant de dnde se surte la receta y alguna farmacias pueden ofrecer precios ms baratos.  El sitio web www.goodrx.com tiene cupones para medicamentos de Airline pilot. Los precios aqu no tienen en cuenta lo que podra costar con la ayuda del seguro (puede ser ms barato con su seguro), pero el sitio web puede darle el precio si no utiliz Research scientist (physical sciences).  - Puede imprimir el cupn correspondiente y llevarlo con su receta a la farmacia.  - Tambin puede pasar por nuestra oficina durante el horario de atencin regular y Charity fundraiser una tarjeta de cupones de GoodRx.  - Si necesita que su receta  se enve electrnicamente a una farmacia diferente, informe a nuestra oficina a travs de MyChart de [place] o por telfono llamando al [phone] y presione la opcin 4.

## 2022-01-28 NOTE — Progress Notes (Unsigned)
   Follow-Up Visit   Subjective  [name] is a 71 y.o. female who presents for the following: Other (Spots of arms and hands - left hand is hot and swollen). She scraped arm with stick.  May have also gotten in poison ivy.  The following portions of the chart were reviewed this encounter and updated as appropriate:   Tobacco  Allergies  Meds  Problems  Med Hx  Surg Hx  Fam Hx     Review of Systems:  No other skin or systemic complaints except as noted in HPI or Assessment and Plan.  Objective  Well appearing patient in no apparent distress; mood and affect are within normal limits.  A focused examination was performed including arms, hands. Relevant physical exam findings are noted in the Assessment and Plan.   Assessment & Plan  Cellulitis of left upper extremity 2ndary to injury with stick Left Forearm - Posterior  Start Doxycycline 100 mg 1 po bid with food and plenty of fluid x 10 days  Mupirocin ointment qd-bid  doxycycline (VIBRA-TABS) 100 MG tablet - Left Forearm - Posterior Take 1 tablet (100 mg total) by mouth 2 (two) times daily. With food and plenty of fluid  mupirocin ointment (BACTROBAN) 2 % - Left Forearm - Posterior Apply 1 Application topically 2 (two) times daily.  Allergic dermatitis 2ndary to plant Right Forearm - Posterior  Allergic contact dermatitis secondary to poison ivy vs bite reaction  Mometasone cream qd-bid until clear - patient has  Return if symptoms worsen or fail to improve.  I, Ashok Cordia, CMA, am acting as scribe for Sarina Ser, MD . Documentation: I have reviewed the above documentation for accuracy and completeness, and I agree with the above.  Sarina Ser, MD

## 2022-01-30 ENCOUNTER — Encounter: Payer: Self-pay | Admitting: Dermatology

## 2022-01-31 ENCOUNTER — Other Ambulatory Visit: Payer: Self-pay

## 2022-01-31 ENCOUNTER — Telehealth: Payer: Self-pay

## 2022-01-31 DIAGNOSIS — L03114 Cellulitis of left upper limb: Secondary | ICD-10-CM

## 2022-01-31 MED ORDER — SULFAMETHOXAZOLE-TRIMETHOPRIM 800-160 MG PO TABS: 1.0000 | ORAL_TABLET | Freq: Two times a day (BID) | ORAL | 0 refills | Status: AC

## 2022-01-31 NOTE — Telephone Encounter (Signed)
Spoke with patient and she is not aware of any allergies to Sulfa or penicillins. Sent in Bactrim DS 1 po bid x 10 days to [facility] Fee., RMA

## 2022-01-31 NOTE — Telephone Encounter (Signed)
Patient advises hand is still really red and looks like blisters at the top. Patient unable to tolerate the doxycycline even with food and water.  Call back # for patient [place]., RMA

## 2022-04-02 ENCOUNTER — Ambulatory Visit (INDEPENDENT_AMBULATORY_CARE_PROVIDER_SITE_OTHER): Payer: Medicare HMO | Admitting: Dermatology

## 2022-04-02 DIAGNOSIS — L988 Other specified disorders of the skin and subcutaneous tissue: Secondary | ICD-10-CM

## 2022-04-02 DIAGNOSIS — L820 Inflamed seborrheic keratosis: Secondary | ICD-10-CM

## 2022-04-02 DIAGNOSIS — L821 Other seborrheic keratosis: Secondary | ICD-10-CM | POA: Diagnosis not present

## 2022-04-02 DIAGNOSIS — L570 Actinic keratosis: Secondary | ICD-10-CM | POA: Diagnosis not present

## 2022-04-02 DIAGNOSIS — L578 Other skin changes due to chronic exposure to nonionizing radiation: Secondary | ICD-10-CM | POA: Diagnosis not present

## 2022-04-02 NOTE — Progress Notes (Signed)
Follow-Up Visit   Subjective  [name] is a 71 y.o. female who presents for the following: Facial Elastosis (Patient is here today for Botox and fillers of the upper lip and nasolabial areas). The patient has spots, moles and lesions to be evaluated, some may be new or changing and the patient has concerns that these could be cancer.  The following portions of the chart were reviewed this encounter and updated as appropriate:   Tobacco  Allergies  Meds  Problems  Med Hx  Surg Hx  Fam Hx     Review of Systems:  No other skin or systemic complaints except as noted in HPI or Assessment and Plan.  Objective  Well appearing patient in no apparent distress; mood and affect are within normal limits.  A focused examination was performed including the face and extremities. Relevant physical exam findings are noted in the Assessment and Plan.  Face Rhytides and volume loss.                      L infraorbital x 1, L lat nose x 1 (2) Erythematous thin papules/macules with gritty scale.   L side x 2 Erythematous stuck-on, waxy papule or plaque   Assessment & Plan  Elastosis of skin Face  Restylane Refyne 1 syringe injected as marked: - B/L oral commissures  - Ant chin - L chin - B/L nasolabial creases  Botox 4 units injected into the upper lip as marked.   Filling material injection - Face Location: See attached image  Informed consent: Discussed risks (infection, pain, bleeding, bruising, swelling, allergic reaction, paralysis of nearby muscles, eyelid droop, double vision, neck weakness, difficulty breathing, headache, undesirable cosmetic result, and need for additional treatment) and benefits of the procedure, as well as the alternatives.  Informed consent was obtained.  Preparation: The area was cleansed with alcohol.  Procedure Details:  Botox was injected into the dermis with a 30-gauge needle. Pressure applied to any bleeding. Ice packs  offered for swelling.  Lot Number:  [id number] Expiration:  06/2024  Total Units Injected:  4  Plan: Patient was instructed to remain upright for 4 hours. Patient was instructed to avoid massaging the face and avoid vigorous exercise for the rest of the day. Tylenol may be used for headache.  Allow 2 weeks before returning to clinic for additional dosing as needed. Patient will call for any problems.   Botox Injection - Face Prior to the procedure, the patient's past medical history, allergies and the rare but potential risks and complications were reviewed with the patient and a signed consent was obtained. Pre and post-treatment care was discussed and instructions provided.  Risks including vascular occlusion were discussed.   Location: See attached photo  Filler Type: Restylane Refyne  FFM#[id number] Exp date:11/03/2022  Procedure: The area was prepped thoroughly with Puracyn. After introducing the needle into the desired treatment area, the syringe plunger was drawn back to ensure there was no flash of blood prior to injecting the filler in order to minimize risk of intravascular injection and vascular occlusion. After injection of the filler, the treated areas were cleansed and iced to reduce swelling. Post-treatment instructions were reviewed with the patient.       Patient tolerated the procedure well. The patient will call with any problems, questions or concerns prior to their next appointment.   AK (actinic keratosis) (2) L infraorbital x 1, L lat nose x 1  Destruction of lesion - L  infraorbital x 1, L lat nose x 1 Complexity: simple   Destruction method: cryotherapy   Informed consent: discussed and consent obtained   Timeout:  patient name, date of birth, surgical site, and procedure verified Lesion destroyed using liquid nitrogen: Yes   Region frozen until ice ball extended beyond lesion: Yes   Outcome: patient tolerated procedure well with no complications   Post-procedure  details: wound care instructions given    Inflamed seborrheic keratosis L side x 2  Symptomatic, irritating, patient would like treated.   Destruction of lesion - L side x 2 Complexity: simple   Destruction method: cryotherapy   Informed consent: discussed and consent obtained   Timeout:  patient name, date of birth, surgical site, and procedure verified Lesion destroyed using liquid nitrogen: Yes   Region frozen until ice ball extended beyond lesion: Yes   Outcome: patient tolerated procedure well with no complications   Post-procedure details: wound care instructions given    Actinic skin damage  Seborrheic keratosis  Actinic Damage - chronic, secondary to cumulative UV radiation exposure/sun exposure over time - diffuse scaly erythematous macules with underlying dyspigmentation - Recommend daily broad spectrum sunscreen SPF 30+ to sun-exposed areas, reapply every 2 hours as needed.  - Recommend staying in the shade or wearing long sleeves, sun glasses (UVA+UVB protection) and wide brim hats (4-inch brim around the entire circumference of the hat). - Call for new or changing lesions.  Seborrheic Keratoses - Stuck-on, waxy, tan-brown papules and/or plaques  - Benign-appearing - Discussed benign etiology and prognosis. - Observe - Call for any changes  Return in about 3 months (around 07/03/2022) for Botox .  Luther Redo, CMA, am acting as scribe for Sarina Ser, MD . Documentation: I have reviewed the above documentation for accuracy and completeness, and I agree with the above.  Sarina Ser, MD

## 2022-04-02 NOTE — Patient Instructions (Signed)
Due to recent changes in healthcare laws, you may see results of your pathology and/or laboratory studies on MyChart before the doctors have had a chance to review them. We understand that in some cases there may be results that are confusing or concerning to you. Please understand that not all results are received at the same time and often the doctors may need to interpret multiple results in order to provide you with the best plan of care or course of treatment. Therefore, we ask that you please give us 2 business days to thoroughly review all your results before contacting the office for clarification. Should we see a critical lab result, you will be contacted sooner.   If You Need Anything After Your Visit  If you have any questions or concerns for your doctor, please call our main line at [phone] and press option 4 to reach your doctor's medical assistant. If no one answers, please leave a voicemail as directed and we will return your call as soon as possible. Messages left after 4 pm will be answered the following business day.   You may also send us a message via MyChart. We typically respond to MyChart messages within 1-2 business days.  For prescription refills, please ask your pharmacy to contact our office. Our fax number is [phone].  If you have an urgent issue when the clinic is closed that cannot wait until the next business day, you can page your doctor at the number below.    Please note that while we do our best to be available for urgent issues outside of office hours, we are not available 24/7.   If you have an urgent issue and are unable to reach us, you may choose to seek medical care at your doctor's office, retail clinic, urgent care center, or emergency room.  If you have a medical emergency, please immediately call 911 or go to the emergency department.  Pager Numbers  - Dr. Kowalski: [phone]  - Dr. Moye: [phone]  - Dr. Stewart:  [phone]  In the event of inclement weather, please call our main line at [phone] for an update on the status of any delays or closures.  Dermatology Medication Tips: Please keep the boxes that topical medications come in in order to help keep track of the instructions about where and how to use these. Pharmacies typically print the medication instructions only on the boxes and not directly on the medication tubes.   If your medication is too expensive, please contact our office at [phone] option 4 or send us a message through MyChart.   We are unable to tell what your co-pay for medications will be in advance as this is different depending on your insurance coverage. However, we may be able to find a substitute medication at lower cost or fill out paperwork to get insurance to cover a needed medication.   If a prior authorization is required to get your medication covered by your insurance company, please allow us 1-2 business days to complete this process.  Drug prices often vary depending on where the prescription is filled and some pharmacies may offer cheaper prices.  The website www.goodrx.com contains coupons for medications through different pharmacies. The prices here do not account for what the cost may be with help from insurance (it may be cheaper with your insurance), but the website can give you the price if you did not use any insurance.  - You can print the associated coupon and take it with   your prescription to the pharmacy.  - You may also stop by our office during regular business hours and pick up a GoodRx coupon card.  - If you need your prescription sent electronically to a different pharmacy, notify our office through [facility] MyChart or by phone at [phone] option 4.     Si Usted Necesita Algo Despus de Su Visita  Tambin puede enviarnos un mensaje a travs de MyChart. Por lo general respondemos a los mensajes de MyChart en el transcurso de 1 a 2  das hbiles.  Para renovar recetas, por favor pida a su farmacia que se ponga en contacto con nuestra oficina. Nuestro nmero de fax es el [phone].  Si tiene un asunto urgente cuando la clnica est cerrada y que no puede esperar hasta el siguiente da hbil, puede llamar/localizar a su doctor(a) al nmero que aparece a continuacin.   Por favor, tenga en cuenta que aunque hacemos todo lo posible para estar disponibles para asuntos urgentes fuera del horario de oficina, no estamos disponibles las 24 horas del da, los 7 das de la semana.   Si tiene un problema urgente y no puede comunicarse con nosotros, puede optar por buscar atencin mdica  en el consultorio de su doctor(a), en una clnica privada, en un centro de atencin urgente o en una sala de emergencias.  Si tiene una emergencia mdica, por favor llame inmediatamente al 911 o vaya a la sala de emergencias.  Nmeros de bper  - Dr. Kowalski: [phone]  - Dra. Moye: [phone]  - Dra. Stewart: [phone]  En caso de inclemencias del tiempo, por favor llame a nuestra lnea principal al [phone] para una actualizacin sobre el estado de cualquier retraso o cierre.  Consejos para la medicacin en dermatologa: Por favor, guarde las cajas en las que vienen los medicamentos de uso tpico para ayudarle a seguir las instrucciones sobre dnde y cmo usarlos. Las farmacias generalmente imprimen las instrucciones del medicamento slo en las cajas y no directamente en los tubos del medicamento.   Si su medicamento es muy caro, por favor, pngase en contacto con nuestra oficina llamando al [phone] y presione la opcin 4 o envenos un mensaje a travs de MyChart.   No podemos decirle cul ser su copago por los medicamentos por adelantado ya que esto es diferente dependiendo de la cobertura de su seguro. Sin embargo, es posible que podamos encontrar un medicamento sustituto a menor costo o llenar un formulario para que el  seguro cubra el medicamento que se considera necesario.   Si se requiere una autorizacin previa para que su compaa de seguros cubra su medicamento, por favor permtanos de 1 a 2 das hbiles para completar este proceso.  Los precios de los medicamentos varan con frecuencia dependiendo del lugar de dnde se surte la receta y alguna farmacias pueden ofrecer precios ms baratos.  El sitio web www.goodrx.com tiene cupones para medicamentos de diferentes farmacias. Los precios aqu no tienen en cuenta lo que podra costar con la ayuda del seguro (puede ser ms barato con su seguro), pero el sitio web puede darle el precio si no utiliz ningn seguro.  - Puede imprimir el cupn correspondiente y llevarlo con su receta a la farmacia.  - Tambin puede pasar por nuestra oficina durante el horario de atencin regular y recoger una tarjeta de cupones de GoodRx.  - Si necesita que su receta se enve electrnicamente a una farmacia diferente, informe a nuestra oficina a travs de MyChart de [facility]   o por telfono llamando al [phone] y presione la opcin 4.  

## 2022-04-03 ENCOUNTER — Encounter: Payer: Self-pay | Admitting: Dermatology

## 2022-04-03 ENCOUNTER — Telehealth: Payer: Self-pay

## 2022-04-03 NOTE — Telephone Encounter (Signed)
Pt sent a message about her face having swelling, redness at LN2 site on L face from yesterday's treatment.  Pt tried uploading a photo and it did not come over.  I advised pt to try again on her mobile.  I scheduled pt an appt for tomorrow for Dr. Nehemiah Massed to check the area.[name]

## 2022-04-04 ENCOUNTER — Ambulatory Visit: Payer: Medicare HMO | Admitting: Dermatology

## 2022-04-14 ENCOUNTER — Encounter: Payer: Self-pay | Admitting: Dermatology

## 2022-04-16 ENCOUNTER — Other Ambulatory Visit: Payer: Self-pay | Admitting: Internal Medicine

## 2022-04-16 DIAGNOSIS — Z1231 Encounter for screening mammogram for malignant neoplasm of breast: Secondary | ICD-10-CM

## 2022-04-26 IMAGING — CT CT MAXILLOFACIAL W/O CM
3 series · 16 of 47 positions shown, 19 images · non-contrast
Comparison: 04/22/2018 CT head

CLINICAL DATA: Sinus pain

EXAM:
CT MAXILLOFACIAL WITHOUT CONTRAST
TECHNIQUE: Multidetector CT images of the maxillofacial structures were
obtained using the standard protocol without intravenous contrast.

[Series 2: standard · axial · 0.33mm/px · z∈[+401,+505]mm · 10 of 122 slices shown, 13 images]
[im 9/122  brain]
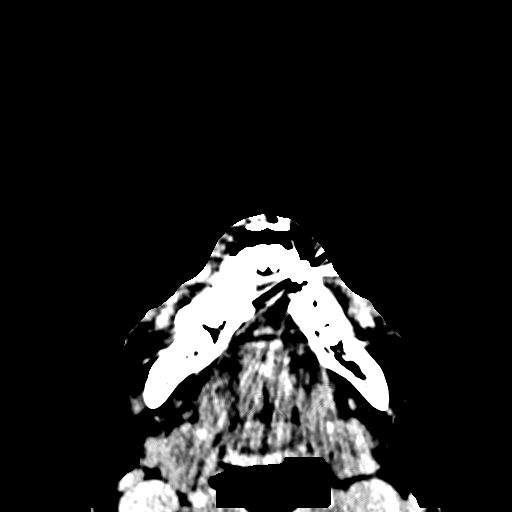
[im 9/122  bone]
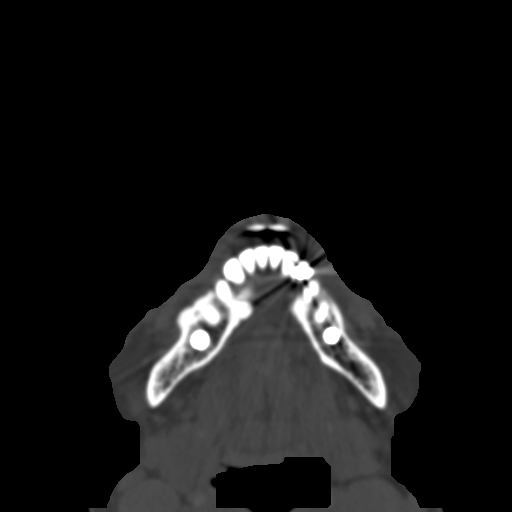
[im 21/122  bone]
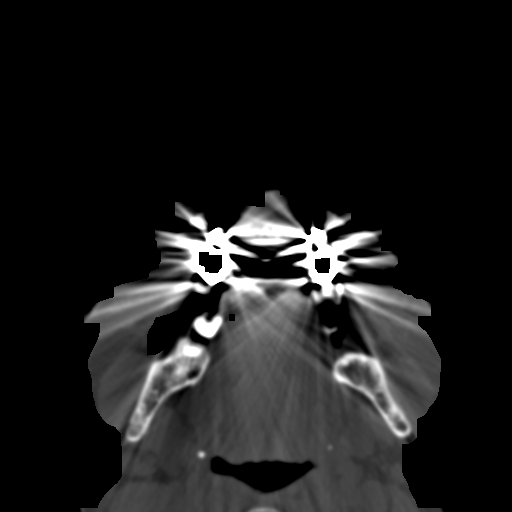
[im 34/122  bone]
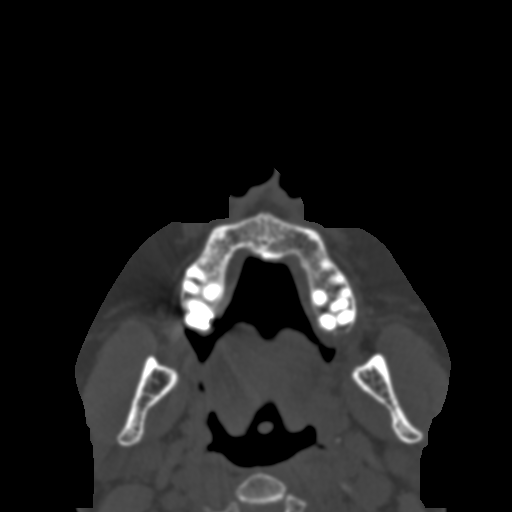
[im 42/122  bone]
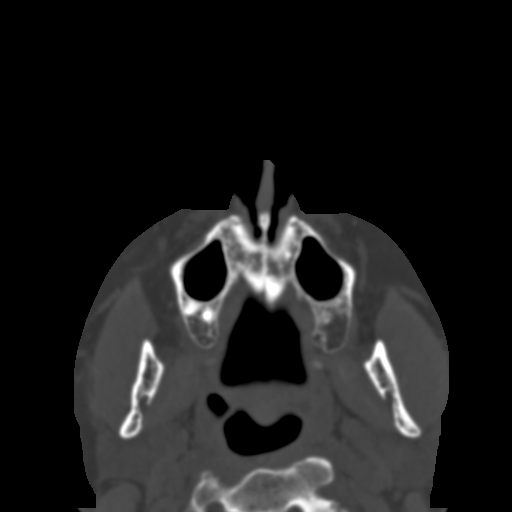
[im 55/122  brain]
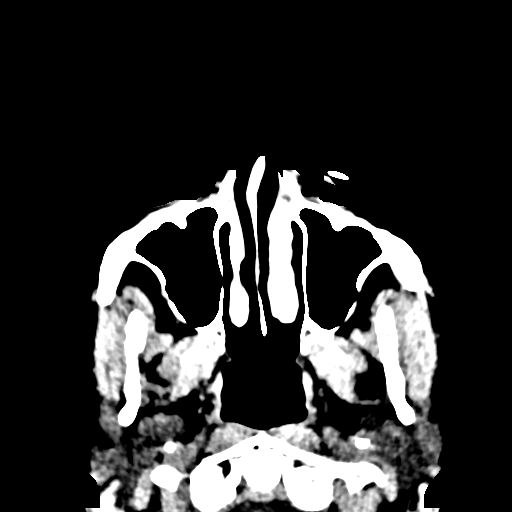
[im 55/122  bone]
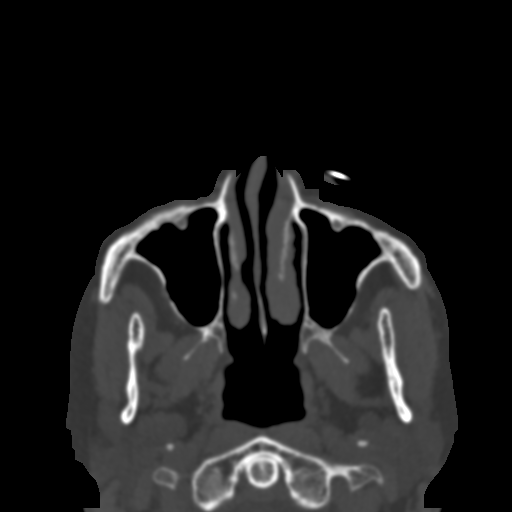
[im 67/122  bone]
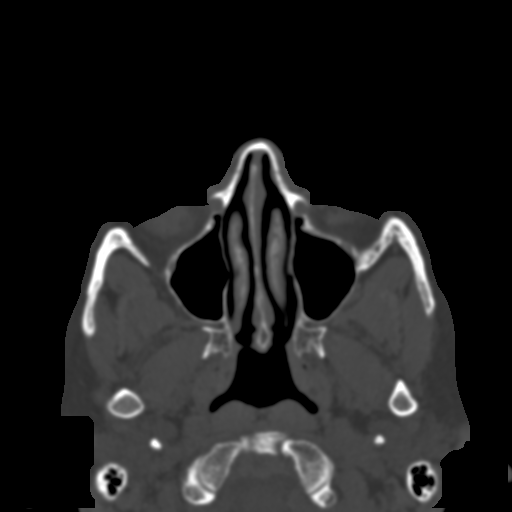
[im 80/122  bone]
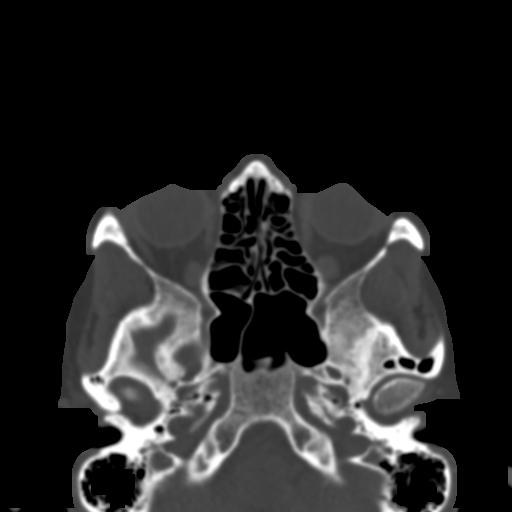
[im 92/122  bone]
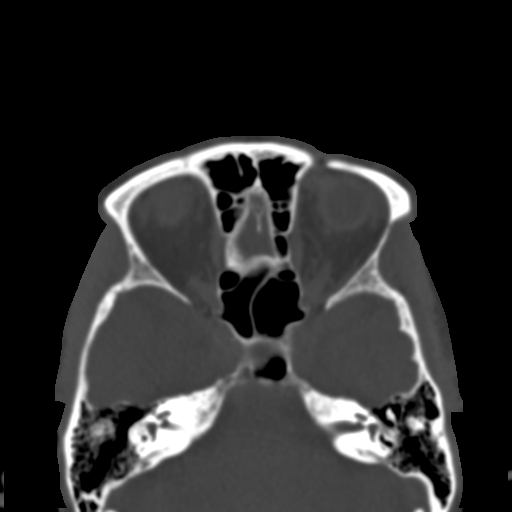
[im 101/122  brain]
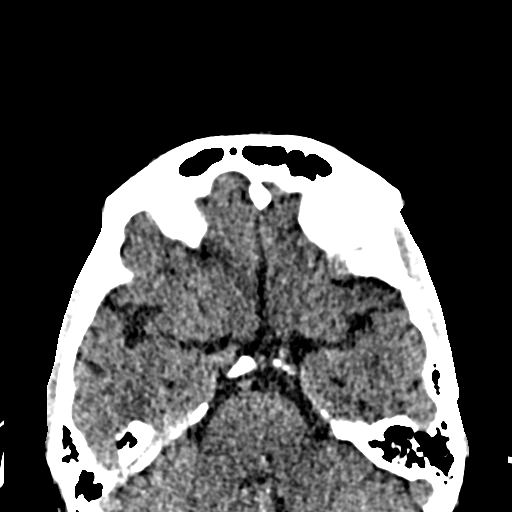
[im 101/122  bone]
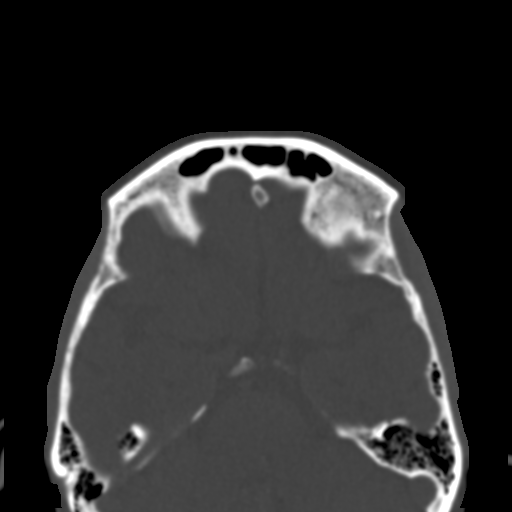
[im 113/122  bone]
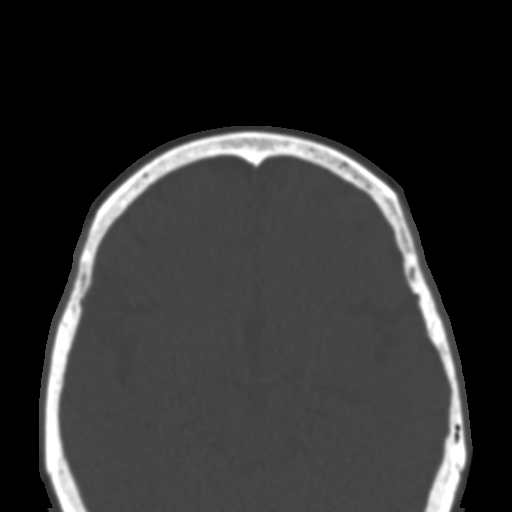

[Series 4: coronal · coronal · 0.26mm/px · 3 of 73 slices shown]
[im 25/73  bone]
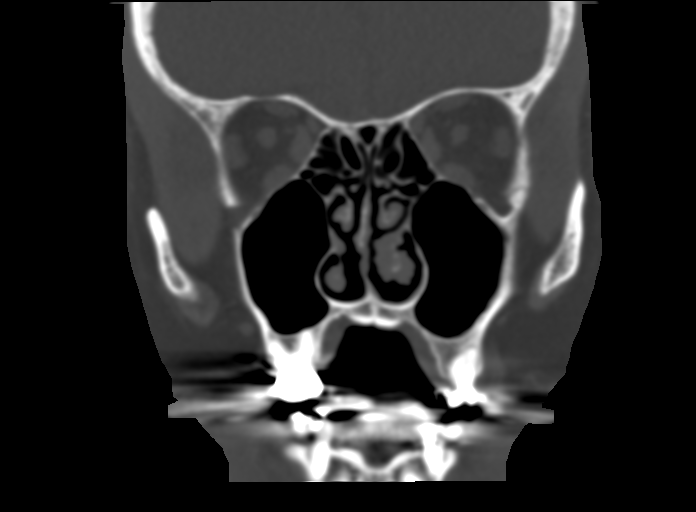
[im 33/73  bone]
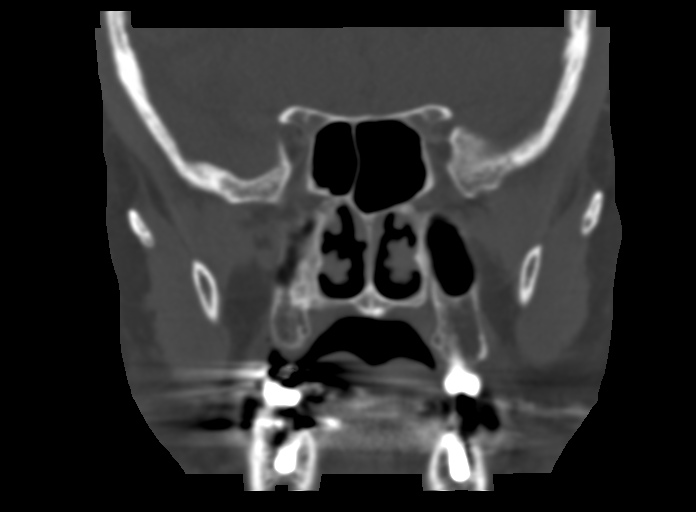
[im 41/73  bone]
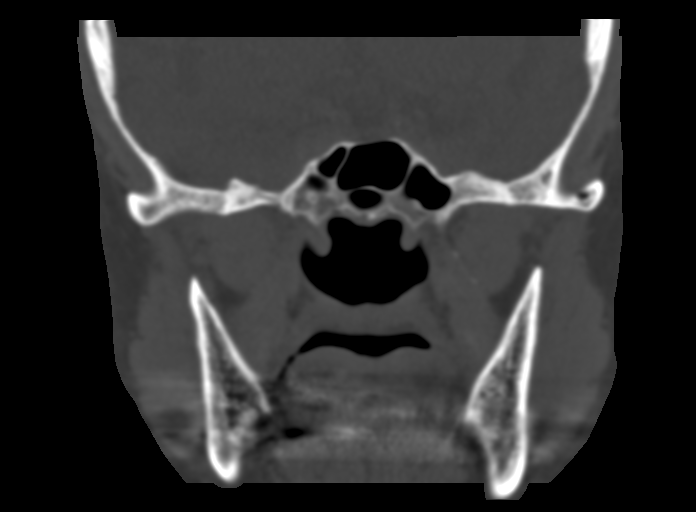

[Series 5: sagittal · sagittal · 0.28mm/px · 3 of 89 slices shown]
[im 30/89  bone]
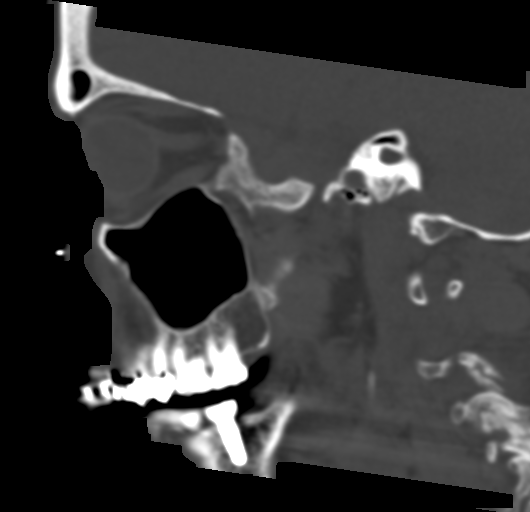
[im 45/89  bone]
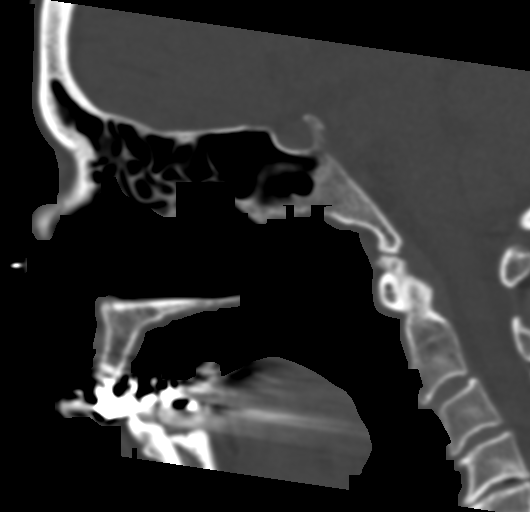
[im 59/89  bone]
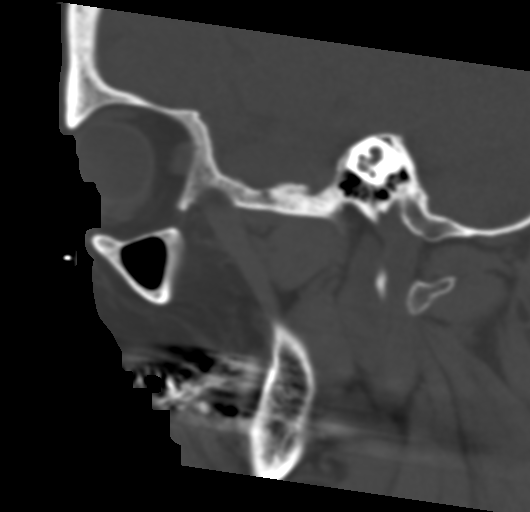

[16 of 47 positions shown; findings below may reference images not displayed]

FINDINGS: Paranasal sinuses:

Frontal: Normally aerated. Patent frontal sinus drainage pathways.

Ethmoid: Normally aerated.

Maxillary: Normally aerated.

Sphenoid: Normally aerated. Patent sphenoethmoidal recesses.

Right ostiomeatal unit: Patent.

Left ostiomeatal unit: Patent.

Nasal passages: Patent. Intact nasal septum, mildly deviated to the
right anteriorly.

Other: None
IMPRESSION: Normally aerated paranasal sinuses.  Patent sinus drainage pathways.

## 2022-05-15 ENCOUNTER — Encounter: Payer: Self-pay | Admitting: Ophthalmology

## 2022-05-23 NOTE — Discharge Instructions (Signed)
INSTRUCTIONS FOLLOWING OCULOPLASTIC SURGERY AMY M. FOWLER, MD  AFTER YOUR EYE SURGERY, THER ARE MANY THINGS WHICH YOU, THE PATIENT, CAN DO TO ASSURE THE BEST POSSIBLE RESULT FROM YOUR OPERATION.  THIS SHEET SHOULD BE REFERRED TO WHENEVER QUESTIONS ARISE.  IF THERE ARE ANY QUESTIONS NOT ANSWERED HERE, DO NOT HESITATE TO CALL OUR OFFICE AT [phone] OR [phone].  THERE IS ALWAYS SOMEONE AVAILABLE TO CALL IF QUESTIONS OR PROBLEMS ARISE.  VISION: Your vision may be blurred and out of focus after surgery until you are able to stop using your ointment, swelling resolves and your eye(s) heal. This may take 1 to 2 weeks at the least.  If your vision becomes gradually more dim or dark, this is not normal and you need to call our office immediately.  EYE CARE: For the first 48 hours after surgery, use ice packs frequently - "20 minutes on, 20 minutes off" - to help reduce swelling and bruising.  Small bags of frozen peas or corn make good ice packs along with cloths soaked in ice water.  If you are wearing a patch or other type of dressing following surgery, keep this on for the amount of time specified by your doctor.  For the first week following surgery, you will need to treat your stitches with great care.  It is OK to shower, but take care to not allow soapy water to run into your eye(s) to help reduce chances of infection.  You may gently clean the eyelashes and around the eye(s) with cotton balls and bottled water, BUT DO NOT RUB THE STITCHES VIGOROUSLY.  Keeping your stitches moist with ointment will help promote healing with minimal scar formation.  ACTIVITY: When you leave the surgery center, you should go home, rest and be inactive.  The eye(s) may feel scratchy and keeping the eyes closed will allow for faster healing.  The first week following surgery, avoid straining (anything making the face turn red) or lifting over 20 pounds.  Additionally, avoid bending which causes your head to go below  your waist.  Using your eyes will NOT harm them, so feel free to read, watch television, use the computer, etc as desired.  Driving depends on each individual, so check with your doctor if you have questions about driving. Do not wear contact lenses for about 2 weeks.  Do not wear eye makeup for 2 weeks.  Avoid swimming, hot tubs, gardening, and dusting for 1 to 2 weeks to reduce the risk of an infection.  MEDICATIONS:  You will be given a prescription for an ointment to use 4 times a day on your stitches.  You can use the ointment in your eyes if they feel scratchy or irritated.  If you eyelid(s) don't close completely when you sleep, put some ointment in your eyes before bedtime.  EMERGENCY: If you experience SEVERE EYE PAIN OR HEADACHE UNRELIEVED BY TYLENOL OR TRAMADOL, NAUSEA OR VOMITING, WORSENING REDNESS, OR WORSENING VISION (ESPECIALLY VISION THAT WAS INITIALLY BETTER) CALL [phone] OR [phone] DURING BUSINESS HOURS OR AFTER HOURS. 

## 2022-05-24 ENCOUNTER — Other Ambulatory Visit: Payer: Self-pay

## 2022-05-24 ENCOUNTER — Encounter
Admission: RE | Disposition: A | Discharge status: Home / Self Care | Payer: Self-pay | Source: Home / Self Care | Attending: Ophthalmology

## 2022-05-24 ENCOUNTER — Encounter: Payer: Self-pay | Admitting: Ophthalmology

## 2022-05-24 ENCOUNTER — Ambulatory Visit
Admission: RE | Admit: 2022-05-24 | Discharge: 2022-05-24 | Disposition: A | Discharge status: Home / Self Care | Payer: Medicare HMO | Attending: Ophthalmology | Admitting: Ophthalmology

## 2022-05-24 ENCOUNTER — Ambulatory Visit: Payer: Medicare HMO | Admitting: Anesthesiology

## 2022-05-24 DIAGNOSIS — Z85828 Personal history of other malignant neoplasm of skin: Secondary | ICD-10-CM | POA: Diagnosis not present

## 2022-05-24 DIAGNOSIS — Z79899 Other long term (current) drug therapy: Secondary | ICD-10-CM | POA: Insufficient documentation

## 2022-05-24 DIAGNOSIS — H02831 Dermatochalasis of right upper eyelid: Secondary | ICD-10-CM | POA: Diagnosis not present

## 2022-05-24 DIAGNOSIS — H02833 Dermatochalasis of right eye, unspecified eyelid: Secondary | ICD-10-CM | POA: Diagnosis present

## 2022-05-24 DIAGNOSIS — H02834 Dermatochalasis of left upper eyelid: Secondary | ICD-10-CM | POA: Diagnosis not present

## 2022-05-24 DIAGNOSIS — Z79890 Hormone replacement therapy (postmenopausal): Secondary | ICD-10-CM | POA: Insufficient documentation

## 2022-05-24 DIAGNOSIS — H02403 Unspecified ptosis of bilateral eyelids: Secondary | ICD-10-CM | POA: Diagnosis present

## 2022-05-24 DIAGNOSIS — E039 Hypothyroidism, unspecified: Secondary | ICD-10-CM | POA: Insufficient documentation

## 2022-05-24 HISTORY — PX: BROW LIFT: SHX178

## 2022-05-24 HISTORY — DX: Motion sickness, initial encounter: T75.3XXA

## 2022-05-24 HISTORY — DX: Unspecified osteoarthritis, unspecified site: M19.90

## 2022-05-24 SURGERY — BLEPHAROPLASTY
Anesthesia: Monitor Anesthesia Care | Site: Eye | Laterality: Bilateral

## 2022-05-24 MED ORDER — ERYTHROMYCIN 5 MG/GM OP OINT
TOPICAL_OINTMENT | OPHTHALMIC | Status: DC | PRN
  Administered 2022-05-24: 1 via OPHTHALMIC

## 2022-05-24 MED ORDER — ERYTHROMYCIN 5 MG/GM OP OINT: TOPICAL_OINTMENT | OPHTHALMIC | 2 refills | Status: DC

## 2022-05-24 MED ORDER — MIDAZOLAM HCL 2 MG/2ML IJ SOLN
INTRAMUSCULAR | Status: DC | PRN
  Administered 2022-05-24 (×2): 1 mg via INTRAVENOUS

## 2022-05-24 MED ORDER — LACTATED RINGERS IV SOLN: INTRAVENOUS | Status: DC

## 2022-05-24 MED ORDER — TETRACAINE HCL 0.5 % OP SOLN
OPHTHALMIC | Status: DC | PRN
  Administered 2022-05-24 (×2): 1 [drp] via OPHTHALMIC

## 2022-05-24 MED ORDER — TRAMADOL HCL 50 MG PO TABS: ORAL_TABLET | ORAL | 0 refills | Status: DC

## 2022-05-24 MED ORDER — LIDOCAINE-EPINEPHRINE 2 %-1:100000 IJ SOLN
INTRAMUSCULAR | Status: DC | PRN
  Administered 2022-05-24: 2 mL via OPHTHALMIC

## 2022-05-24 MED ORDER — FENTANYL CITRATE (PF) 100 MCG/2ML IJ SOLN
INTRAMUSCULAR | Status: DC | PRN
  Administered 2022-05-24 (×4): 25 ug via INTRAVENOUS

## 2022-05-24 MED ORDER — ONDANSETRON HCL 4 MG/2ML IJ SOLN
INTRAMUSCULAR | Status: DC | PRN
  Administered 2022-05-24: 4 mg via INTRAVENOUS

## 2022-05-24 MED ORDER — PROPOFOL 10 MG/ML IV BOLUS
INTRAVENOUS | Status: DC | PRN
  Administered 2022-05-24: 10 mg via INTRAVENOUS
  Administered 2022-05-24 (×2): 20 mg via INTRAVENOUS
  Administered 2022-05-24 (×4): 10 mg via INTRAVENOUS

## 2022-05-24 MED ORDER — BSS IO SOLN
INTRAOCULAR | Status: DC | PRN
  Administered 2022-05-24: 15 mL

## 2022-05-24 SURGICAL SUPPLY — 22 items
APPLICATOR COTTON TIP WD 3 STR (MISCELLANEOUS) ×1 IMPLANT
BLADE SURG 15 STRL LF DISP TIS (BLADE) ×1 IMPLANT
BLADE SURG 15 STRL SS (BLADE) ×1
CORD BIP STRL DISP 12FT (MISCELLANEOUS) ×1 IMPLANT
GAUZE SPONGE 4X4 12PLY STRL (GAUZE/BANDAGES/DRESSINGS) ×1 IMPLANT
GLOVE SURG UNDER POLY LF SZ7 (GLOVE) ×2 IMPLANT
GOWN STRL REUS W/ TWL LRG LVL3 (GOWN DISPOSABLE) ×1 IMPLANT
GOWN STRL REUS W/TWL LRG LVL3 (GOWN DISPOSABLE) ×1
MARKER SKIN XFINE TIP W/RULER (MISCELLANEOUS) ×1 IMPLANT
NDL FILTER BLUNT 18X1 1/2 (NEEDLE) ×1 IMPLANT
NDL HYPO 30X.5 LL (NEEDLE) ×2 IMPLANT
NEEDLE FILTER BLUNT 18X1 1/2 (NEEDLE) ×1 IMPLANT
NEEDLE HYPO 30X.5 LL (NEEDLE) ×2 IMPLANT
PACK ENT CUSTOM (PACKS) ×1 IMPLANT
SOL PREP PVP 2OZ (MISCELLANEOUS) ×1
SOLUTION PREP PVP 2OZ (MISCELLANEOUS) ×1 IMPLANT
SPONGE GAUZE 2X2 8PLY STRL LF (GAUZE/BANDAGES/DRESSINGS) ×10 IMPLANT
SUT GUT PLAIN 6-0 1X18 ABS (SUTURE) ×1 IMPLANT
SUT PROLENE 6 0 P 1 18 (SUTURE) IMPLANT
SYR 10ML LL (SYRINGE) ×1 IMPLANT
SYR 3ML LL SCALE MARK (SYRINGE) ×1 IMPLANT
WATER STERILE IRR 250ML POUR (IV SOLUTION) ×1 IMPLANT

## 2022-05-24 NOTE — Transfer of Care (Signed)
Immediate Anesthesia Transfer of Care Note  Patient: [name]  Procedure(s) Performed: BLEPHAROPLASTY UPPER EYELID; W/EXCESS SKIN BLEPHAROPTOSIS REPAIR; RESECT EX BILATERAL (Bilateral: Eye)  Patient Location: PACU  Anesthesia Type: MAC  Level of Consciousness: awake, alert  and patient cooperative  Airway and Oxygen Therapy: Patient Spontanous Breathing and Patient connected to supplemental oxygen  Post-op Assessment: Post-op Vital signs reviewed, Patient's Cardiovascular Status Stable, Respiratory Function Stable, Patent Airway and No signs of Nausea or vomiting  Post-op Vital Signs: Reviewed and stable  Complications: No notable events documented.

## 2022-05-24 NOTE — Anesthesia Postprocedure Evaluation (Signed)
Anesthesia Post Note  Patient: [name]  Procedure(s) Performed: BLEPHAROPLASTY UPPER EYELID; W/EXCESS SKIN BLEPHAROPTOSIS REPAIR; RESECT EX BILATERAL (Bilateral: Eye)  Patient location during evaluation: PACU Anesthesia Type: MAC Level of consciousness: awake and alert Pain management: pain level controlled Vital Signs Assessment: post-procedure vital signs reviewed and stable Respiratory status: spontaneous breathing, nonlabored ventilation and respiratory function stable Cardiovascular status: blood pressure returned to baseline and stable Postop Assessment: no apparent nausea or vomiting Anesthetic complications: no   No notable events documented.   Last Vitals:  Vitals:   05/24/22 1033 05/24/22 1042  BP: 137/81 (!) 136/98  Pulse: 70 66  Resp: 14 15  Temp: 36.5 C 36.5 C  SpO2: 100% 100%    Last Pain:  Vitals:   05/24/22 1033  TempSrc:   PainSc: 0-No pain                 [place] Ouch

## 2022-05-24 NOTE — Interval H&P Note (Signed)
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  The various methods of treatment have been discussed with the patient and family. After consideration of risks, benefits and other options for treatment, the patient has consented to  Procedure(s) with comments: BLEPHAROPLASTY UPPER EYELID; W/EXCESS SKIN BLEPHAROPTOSIS REPAIR; RESECT EX BILATERAL (Bilateral) - needs to be first as a surgical intervention.  The patient's history has been reviewed, patient examined, no change in status, stable for surgery.  I have reviewed the patient's chart and labs.  Questions were answered to the patient's satisfaction.     Vickki Muff, Karisa Nesser M

## 2022-05-24 NOTE — Anesthesia Preprocedure Evaluation (Addendum)
Anesthesia Evaluation  Patient identified by MRN, date of birth, ID band Patient awake    Reviewed: Allergy & Precautions, H&P , NPO status , Patient's Chart, lab work & pertinent test results  Airway Mallampati: II  TM Distance: >3 FB Neck ROM: full    Dental no notable dental hx.    Pulmonary neg pulmonary ROS   Pulmonary exam normal        Cardiovascular negative cardio ROS Normal cardiovascular exam     Neuro/Psych negative neurological ROS  negative psych ROS   GI/Hepatic negative GI ROS, Neg liver ROS,,,  Endo/Other  Hypothyroidism    Renal/GU negative Renal ROS  negative genitourinary   Musculoskeletal   Abdominal Normal abdominal exam  (+)   Peds  Hematology negative hematology ROS (+)   Anesthesia Other Findings Past Medical History: No date: Arthritis 12/08/2008: Basal cell carcinoma     Comment:  right sup medial breast 06/10/2017: Basal cell carcinoma     Comment:  left lat nasal bridge inf to medial canthus 03/02/2019: Basal cell carcinoma     Comment:  right distal med calf 05/02/2010: History of dysplastic nevus     Comment:  right sup pubic/mild No date: Motion sickness     Comment:  boats 2020: Vertigo     Comment:  No issues since Rehab  Past Surgical History: No date: ABDOMINAL HYSTERECTOMY No date: COLONOSCOPY     Comment:  several times 04/13/2021: COLONOSCOPY WITH PROPOFOL; N/A     Comment:  Procedure: COLONOSCOPY WITH PROPOFOL;  Surgeon:               Virgel Manifold, MD;  Location: [facility] ENDOSCOPY;                Service: Gastroenterology;  Laterality: N/A; No date: KNEE SURGERY No date: TOTAL VAGINAL HYSTERECTOMY  BMI    Body Mass Index: 24.33 kg/m      Reproductive/Obstetrics negative OB ROS                             Anesthesia Physical Anesthesia Plan  ASA: 1  Anesthesia Plan: General   Post-op Pain Management:    Induction:  Intravenous  PONV Risk Score and Plan: Propofol infusion and TIVA  Airway Management Planned: Natural Airway  Additional Equipment:   Intra-op Plan:   Post-operative Plan:   Informed Consent: I have reviewed the patients History and Physical, chart, labs and discussed the procedure including the risks, benefits and alternatives for the proposed anesthesia with the patient or authorized representative who has indicated his/her understanding and acceptance.     Dental Advisory Given  Plan Discussed with: CRNA and Surgeon  Anesthesia Plan Comments:        Anesthesia Quick Evaluation

## 2022-05-24 NOTE — H&P (Signed)
[place]: [facility]  Primary Care Physician:  Adin Hector, MD Ophthalmologist: Dr. Philis Pique. Vickki Muff, M.D.  Pre-Procedure History & Physical: HPI:  [name] is a 72 y.o. female here for periocular surgery.   Past Medical History:  Diagnosis Date   Arthritis    Basal cell carcinoma 12/08/2008   right sup medial breast   Basal cell carcinoma 06/10/2017   left lat nasal bridge inf to medial canthus   Basal cell carcinoma 03/02/2019   right distal med calf   History of dysplastic nevus 05/02/2010   right sup pubic/mild   Motion sickness    boats   Vertigo 2020   No issues since Rehab    Past Surgical History:  Procedure Laterality Date   ABDOMINAL HYSTERECTOMY     COLONOSCOPY     several times   COLONOSCOPY WITH PROPOFOL N/A 04/13/2021   Procedure: COLONOSCOPY WITH PROPOFOL;  Surgeon: Virgel Manifold, MD;  Location: [facility] ENDOSCOPY;  Service: Gastroenterology;  Laterality: N/A;   KNEE SURGERY     TOTAL VAGINAL HYSTERECTOMY      Prior to Admission medications   Medication Sig Start Date End Date Taking? Authorizing Provider  albuterol (VENTOLIN HFA) 108 (90 Base) MCG/ACT inhaler INHALE 2 PUFFS INTO THE LUNGS EVERY 6 TO 8 HOURS AS NEEDED FOR COUGH/WHEEZING 04/19/20 05/15/22 Yes Beverly Gust, MD  cetirizine (ZYRTEC) 10 MG chewable tablet Chew 10 mg by mouth daily.   Yes [provider]  cholecalciferol (VITAMIN D) 1000 units tablet Take 1,000 Units by mouth daily.   Yes [provider]  fluticasone (FLONASE) 50 MCG/ACT nasal spray USE TWO SPRAYS IN EACH NOSTRIL DAILY 06/02/20 05/15/22 Yes Beverly Gust, MD  levothyroxine (SYNTHROID) 50 MCG tablet Take 1 tablet (50 mcg total) by mouth every morning before breakfast (0630) ON AN EMPTY STOMACH WITH A GLASS OF WATER AT LEAST 30-60 MINUTES BEFORE BREAKFAST 02/01/21  Yes   levothyroxine (SYNTHROID) 50 MCG tablet TAKE 1 TABLET BY MOUTH ONCE DAILY ON AN EMPTY STOMACH WITH A GLASS OF WATER  AT LEAST 30-60 MINUTES BEFORE BREAKFAST. 03/07/21  Yes   Multiple Vitamin (MULTI-VITAMINS) TABS Take by mouth.   Yes [provider]  timolol (TIMOPTIC) 0.5 % ophthalmic solution Instill one drop in both eyes twice a day 04/23/21  Yes   COVID-19 At Home Antigen Test [facility] COVID-19 HOME TEST) KIT Use as directed 11/22/20   Letta Median, RPH  COVID-19 mRNA bivalent vaccine, Pfizer, (PFIZER COVID-19 VAC BIVALENT) injection Inject into the muscle. 02/20/21   Carlyle Basques, MD  COVID-19 mRNA Vac-TriS, Pfizer, (PFIZER-BIONT COVID-19 VAC-TRIS) SUSP injection Inject into the muscle. 08/10/20   Carlyle Basques, MD    Allergies as of 02/18/2022 - Review Complete 01/30/2022  Allergen Reaction Noted   Iodinated contrast media Hives 02/09/2015   Bacitracin Rash 12/18/2021   Neosporin [bacitracin-polymyxin b] Rash 12/18/2021    Family History  Problem Relation Age of Onset   Lung cancer Father    Breast cancer Neg Hx     Social History   Socioeconomic History   Marital status: Married    Spouse name: Not on file   Number of children: Not on file   Years of education: Not on file   Highest education level: Not on file  Occupational History   Not on file  Tobacco Use   Smoking status: Never   Smokeless tobacco: Never  Vaping Use   Vaping Use: Never used  Substance and Sexual  Activity   Alcohol use: No    Alcohol/week: 0.0 standard drinks of alcohol   Drug use: Not Currently   Sexual activity: Not on file  Other Topics Concern   Not on file  Social History Narrative   Not on file   Social Determinants of Health   Financial Resource Strain: Not on file  Food Insecurity: Not on file  Transportation Needs: Not on file  Physical Activity: Not on file  Stress: Not on file  Social Connections: Not on file  Intimate Partner Violence: Not on file    Review of Systems: See HPI, otherwise negative ROS  Physical Exam: BP (!) 136/90   Pulse 65   Temp 97.6 F (36.4  C) (Temporal)   Ht '5\' 2"'$  (1.575 m)   Wt 59.4 kg   SpO2 100%   BMI 23.96 kg/m  General:   Alert and cooperative in NAD Head:  Normocephalic and atraumatic. Respiratory:  Normal work of breathing.  Impression/Plan: [name] is here for periocular surgery.  Risks, benefits, limitations, and alternatives regarding surgery have been reviewed with the patient.  Questions have been answered.  All parties agreeable.   Karle Starch, MD  05/24/2022, 9:17 AM

## 2022-05-24 NOTE — Op Note (Signed)
Preoperative Diagnosis:  1. Visually significant blepharoptosis bilateral  Upper Eyelid(s) 2. Visually significant dermatochalasis bilateral  Upper Eyelid(s)  Postoperative Diagnosis:  Same.  Procedure(s) Performed:   1. Blepharoptosis repair with levator aponeurosis advancement bilateral  Upper Eyelid(s) 2. Upper eyelid blepharoplasty with excess skin excision  bilateral  Upper Eyelid(s)  Surgeon: Philis Pique. Vickki Muff, M.D.  Assistants: none  Anesthesia: MAC  Specimens: None.  Estimated Blood Loss: Minimal.  Complications: None.  Operative Findings: None Dictated  Procedure:   Allergies were reviewed and the patient Iodinated contrast media, Levaquin [levofloxacin], Bacitracin, and Neosporin [bacitracin-polymyxin b].   After the risks, benefits, complications and alternatives were discussed with the patient, appropriate informed consent was obtained.  While seated in an upright position and looking in primary gaze, the mid pupillary line was marked on the upper eyelid margins bilaterally. The patient was then brought to the operating suite and reclined supine.  Timeout was conducted and the patient was sedated.  Local anesthetic consisting of a 50-50 mixture of 2% lidocaine with epinephrine and 0.75% bupivacaine with added Hylenex was injected subcutaneously to both  upper eyelid(s). After adequate local was instilled, the patient was prepped and draped in the usual sterile fashion for eyelid surgery.   Attention was turned to the upper eyelids. A 70m upper eyelid crease incision line was marked with calipers on both  upper eyelid(s).  A pinch test was used to estimate the amount of excess skin to remove and this was marked in standard blepharoplasty style fashion. Attention was turned to the  right  upper eyelid. A #15 blade was used to open the premarked incision line. A Skin only flap was excised and hemostasis was obtained with bipolar cautery.   Westcott scissors were then used to  transect through orbicularis down to the tarsal plate. Epitarsus was dissected to create a smooth surface to suture to. Dissection was then carried superiorly in the plane between orbicularis and orbital septum. Once the preaponeurotic fat pocket was identified, the orbital septum was opened. This revealed the levator and its aponeurosis.    Attention was then turned to the opposite eyelid where the same procedure was performed in the same manner. Hemostasis was obtained with bipolar cautery throughout.   3 interrupted 6-0 Prolene sutures were then passed partial thickness through the tarsal plates of both  upper eyelid(s). These sutures were placed in line with the mid pupillary, medial limbal, and lateral limbal lines. The sutures were fixed to the levator aponeurosis and adjusted until a nice lid height and contour were achieved. Once nice symmetry was achieved, the skin incisions were closed with a running 6-0 plain gut suture. The patient tolerated the procedure well.  Erythromycin ophthalmic ophthalmic ointment was applied to the incision site(s) followed by ice packs. The patient was taken to the recovery area where she recovered without difficulty.  Post-Op Plan/Instructions:   The patient was instructed to use ice packs frequently for the next 48 hours. She was instructed to use Erythromycin ophthalmic ophthalmic ointment on her incisions 4 times a day for the next 12 to 14 days. Shewas given a prescription for tramadol (or similar) for pain control should Tylenol not be effective. [facility]3 [facility] or sooner as needed for problems.  Zofia Peckinpaugh M. FVickki Muff M.D. Ophthalmology

## 2022-05-27 ENCOUNTER — Encounter: Payer: Self-pay | Admitting: Ophthalmology

## 2022-06-12 ENCOUNTER — Ambulatory Visit
Admission: RE | Admit: 2022-06-12 | Discharge: 2022-06-12 | Disposition: A | Discharge status: Home / Self Care | Payer: Medicare HMO | Source: Ambulatory Visit | Attending: Internal Medicine | Admitting: Internal Medicine

## 2022-06-12 DIAGNOSIS — Z1231 Encounter for screening mammogram for malignant neoplasm of breast: Secondary | ICD-10-CM | POA: Diagnosis present

## 2022-07-12 ENCOUNTER — Other Ambulatory Visit: Payer: Self-pay | Admitting: Dermatology

## 2022-07-12 DIAGNOSIS — R21 Rash and other nonspecific skin eruption: Secondary | ICD-10-CM

## 2022-07-30 ENCOUNTER — Ambulatory Visit (INDEPENDENT_AMBULATORY_CARE_PROVIDER_SITE_OTHER): Payer: Self-pay | Admitting: Dermatology

## 2022-07-30 VITALS — BP 177/114 | HR 90

## 2022-07-30 DIAGNOSIS — L988 Other specified disorders of the skin and subcutaneous tissue: Secondary | ICD-10-CM

## 2022-07-30 NOTE — Progress Notes (Unsigned)
   Follow-Up Visit   Subjective  [name] is a 72 y.o. female who presents for the following: Botox and filler for facial elastosis  The following portions of the chart were reviewed this encounter and updated as appropriate: medications, allergies, medical history  Review of Systems:  No other skin or systemic complaints except as noted in HPI or Assessment and Plan.  Objective  Well appearing patient in no apparent distress; mood and affect are within normal limits.  A focused examination was performed of the face.             Assessment & Plan    Facial Elastosis -   Botox 27.5 units injected as marked:  - Frown complex 27.5 units  Location: See attached image  Informed consent: Discussed risks (infection, pain, bleeding, bruising, swelling, allergic reaction, paralysis of nearby muscles, eyelid droop, double vision, neck weakness, difficulty breathing, headache, undesirable cosmetic result, and need for additional treatment) and benefits of the procedure, as well as the alternatives.  Informed consent was obtained.  Preparation: The area was cleansed with alcohol.  Procedure Details:  Botox was injected into the dermis with a 30-gauge needle. Pressure applied to any bleeding. Ice packs offered for swelling.  Lot Number:  [id number] Expiration:  03/26  Total Units Injected:  27.5  Plan: Tylenol may be used for headache.  Allow 2 weeks before returning to clinic for additional dosing as needed. Patient will call for any problems.  Prior to the procedure, the patient's past medical history, allergies and the rare but potential risks and complications were reviewed with the patient and a signed consent was obtained. Pre and post-treatment care was discussed and instructions provided.  Location: marionette lines and chin  Filler Type: Restylane refyne  Lot number: [id number] Expiration date: 06/06/2023  Procedure: The area was prepped thoroughly with Puracyn.  After introducing the needle into the desired treatment area, the syringe plunger was drawn back to ensure there was no flash of blood prior to injecting the filler in order to minimize risk of intravascular injection and vascular occlusion. After injection of the filler, the treated areas were cleansed and iced to reduce swelling. Post-treatment instructions were reviewed with the patient.       Patient tolerated the procedure well. The patient will call with any problems, questions or concerns prior to their next appointment.   Return in about 4 months (around 11/29/2022) for Botox injections.  Luther Redo, CMA, am acting as scribe for Sarina Ser, MD .  Documentation: I have reviewed the above documentation for accuracy and completeness, and I agree with the above.  Sarina Ser, MD

## 2022-07-30 NOTE — Patient Instructions (Signed)
Due to recent changes in healthcare laws, you may see results of your pathology and/or laboratory studies on MyChart before the doctors have had a chance to review them. We understand that in some cases there may be results that are confusing or concerning to you. Please understand that not all results are received at the same time and often the doctors may need to interpret multiple results in order to provide you with the best plan of care or course of treatment. Therefore, we ask that you please give us 2 business days to thoroughly review all your results before contacting the office for clarification. Should we see a critical lab result, you will be contacted sooner.   If You Need Anything After Your Visit  If you have any questions or concerns for your doctor, please call our main line at [phone] and press option 4 to reach your doctor's medical assistant. If no one answers, please leave a voicemail as directed and we will return your call as soon as possible. Messages left after 4 pm will be answered the following business day.   You may also send us a message via MyChart. We typically respond to MyChart messages within 1-2 business days.  For prescription refills, please ask your pharmacy to contact our office. Our fax number is [phone].  If you have an urgent issue when the clinic is closed that cannot wait until the next business day, you can page your doctor at the number below.    Please note that while we do our best to be available for urgent issues outside of office hours, we are not available 24/7.   If you have an urgent issue and are unable to reach us, you may choose to seek medical care at your doctor's office, retail clinic, urgent care center, or emergency room.  If you have a medical emergency, please immediately call 911 or go to the emergency department.  Pager Numbers  - Dr. Kowalski: [phone]  - Dr. Moye: [phone]  - Dr. Stewart:  [phone]  In the event of inclement weather, please call our main line at [phone] for an update on the status of any delays or closures.  Dermatology Medication Tips: Please keep the boxes that topical medications come in in order to help keep track of the instructions about where and how to use these. Pharmacies typically print the medication instructions only on the boxes and not directly on the medication tubes.   If your medication is too expensive, please contact our office at [phone] option 4 or send us a message through MyChart.   We are unable to tell what your co-pay for medications will be in advance as this is different depending on your insurance coverage. However, we may be able to find a substitute medication at lower cost or fill out paperwork to get insurance to cover a needed medication.   If a prior authorization is required to get your medication covered by your insurance company, please allow us 1-2 business days to complete this process.  Drug prices often vary depending on where the prescription is filled and some pharmacies may offer cheaper prices.  The website www.goodrx.com contains coupons for medications through different pharmacies. The prices here do not account for what the cost may be with help from insurance (it may be cheaper with your insurance), but the website can give you the price if you did not use any insurance.  - You can print the associated coupon and take it with   your prescription to the pharmacy.  - You may also stop by our office during regular business hours and pick up a GoodRx coupon card.  - If you need your prescription sent electronically to a different pharmacy, notify our office through [place] MyChart or by phone at [phone] option 4.     Si Usted Necesita Algo Despus de Su Visita  Tambin puede enviarnos un mensaje a travs de MyChart. Por lo general respondemos a los mensajes de MyChart en el transcurso de 1 a 2  das hbiles.  Para renovar recetas, por favor pida a su farmacia que se ponga en contacto con nuestra oficina. Nuestro nmero de fax es el [phone].  Si tiene un asunto urgente cuando la clnica est cerrada y que no puede esperar hasta el siguiente da hbil, puede llamar/localizar a su doctor(a) al nmero que aparece a continuacin.   Por favor, tenga en cuenta que aunque hacemos todo lo posible para estar disponibles para asuntos urgentes fuera del horario de oficina, no estamos disponibles las 24 horas del da, los 7 das de la semana.   Si tiene un problema urgente y no puede comunicarse con nosotros, puede optar por buscar atencin mdica  en el consultorio de su doctor(a), en una clnica privada, en un centro de atencin urgente o en una sala de emergencias.  Si tiene una emergencia mdica, por favor llame inmediatamente al 911 o vaya a la sala de emergencias.  Nmeros de bper  - Dr. Kowalski: [phone]  - Dra. Moye: [phone]  - Dra. Stewart: [phone]  En caso de inclemencias del tiempo, por favor llame a nuestra lnea principal al [phone] para una actualizacin sobre el estado de cualquier retraso o cierre.  Consejos para la medicacin en dermatologa: Por favor, guarde las cajas en las que vienen los medicamentos de uso tpico para ayudarle a seguir las instrucciones sobre dnde y cmo usarlos. Las farmacias generalmente imprimen las instrucciones del medicamento slo en las cajas y no directamente en los tubos del medicamento.   Si su medicamento es muy caro, por favor, pngase en contacto con nuestra oficina llamando al [phone] y presione la opcin 4 o envenos un mensaje a travs de MyChart.   No podemos decirle cul ser su copago por los medicamentos por adelantado ya que esto es diferente dependiendo de la cobertura de su seguro. Sin embargo, es posible que podamos encontrar un medicamento sustituto a menor costo o llenar un formulario para que el  seguro cubra el medicamento que se considera necesario.   Si se requiere una autorizacin previa para que su compaa de seguros cubra su medicamento, por favor permtanos de 1 a 2 das hbiles para completar este proceso.  Los precios de los medicamentos varan con frecuencia dependiendo del lugar de dnde se surte la receta y alguna farmacias pueden ofrecer precios ms baratos.  El sitio web www.goodrx.com tiene cupones para medicamentos de diferentes farmacias. Los precios aqu no tienen en cuenta lo que podra costar con la ayuda del seguro (puede ser ms barato con su seguro), pero el sitio web puede darle el precio si no utiliz ningn seguro.  - Puede imprimir el cupn correspondiente y llevarlo con su receta a la farmacia.  - Tambin puede pasar por nuestra oficina durante el horario de atencin regular y recoger una tarjeta de cupones de GoodRx.  - Si necesita que su receta se enve electrnicamente a una farmacia diferente, informe a nuestra oficina a travs de MyChart de [place]   o por telfono llamando al [phone] y presione la opcin 4.  

## 2022-07-31 ENCOUNTER — Encounter: Payer: Self-pay | Admitting: Dermatology

## 2022-08-01 ENCOUNTER — Other Ambulatory Visit: Payer: Self-pay | Admitting: Dermatology

## 2022-08-01 DIAGNOSIS — R21 Rash and other nonspecific skin eruption: Secondary | ICD-10-CM

## 2022-08-27 ENCOUNTER — Ambulatory Visit: Payer: Medicare HMO | Admitting: Dermatology

## 2022-08-27 VITALS — BP 152/92

## 2022-08-27 DIAGNOSIS — D485 Neoplasm of uncertain behavior of skin: Secondary | ICD-10-CM | POA: Diagnosis not present

## 2022-08-27 DIAGNOSIS — L578 Other skin changes due to chronic exposure to nonionizing radiation: Secondary | ICD-10-CM

## 2022-08-27 DIAGNOSIS — D492 Neoplasm of unspecified behavior of bone, soft tissue, and skin: Secondary | ICD-10-CM

## 2022-08-27 DIAGNOSIS — S20419A Abrasion of unspecified back wall of thorax, initial encounter: Secondary | ICD-10-CM | POA: Diagnosis not present

## 2022-08-27 DIAGNOSIS — Z5111 Encounter for antineoplastic chemotherapy: Secondary | ICD-10-CM | POA: Diagnosis not present

## 2022-08-27 DIAGNOSIS — L570 Actinic keratosis: Secondary | ICD-10-CM

## 2022-08-27 MED ORDER — CALCIPOTRIENE 0.005 % EX CREA: TOPICAL_CREAM | Freq: Two times a day (BID) | CUTANEOUS | 2 refills | Status: DC

## 2022-08-27 MED ORDER — FLUOROURACIL 5 % EX CREA: TOPICAL_CREAM | Freq: Two times a day (BID) | CUTANEOUS | 2 refills | Status: DC

## 2022-08-27 NOTE — Patient Instructions (Addendum)
Photodynamic Therapy/Blue Light Therapy  Actinic keratoses are the dry, red scaly spots on the skin caused by sun damage. A portion of these spots can turn into skin cancer with time, and treating them can help prevent development of skin cancer.   Treatment of these spots requires removal of the defective skin cells. There are various ways to remove actinic keratoses, including freezing with liquid nitrogen, treatment with creams, or treatment with a blue light procedure in the office.   Photodynamic Therapy (PDT), also known as "blue light therapy" is an in office procedure used to treat actinic keratoses. It works by targeting precancerous cells. After treatment, these cells peel off and are replaced by healthy ones.   For your phototherapy appointment, you will have two appointments on the day of your treatment. The first appointment will be to apply a cream to the treatment area. You will leave this cream on for 1-2 hours depending on the area being treated. The second appointment will be to shine a blue light on the area for 16 minutes to kill off the precancer cells. It is common to experience a burning sensation during the treatment.  After your treatment, it will be important to keep the treated areas of skin out of the sun completely for 48-72 hours (2-3 days) to prevent having a reaction.   Common side effects include: - Burning or stinging, which may be severe and can last up to 24-72 hours after your treatment - Scaling and crusting which may last up to 2 weeks - Redness, swelling and/or peeling which can last up to 4 weeks  To Care for Your Skin After PDT/Blue Light Therapy: - Wash with soap, water and shampoo as normal. - If needed, you can use cold compresses (e.g. ice packs) for comfort - If okay with your primary care doctor, you may use analgesics such as acetaminophen (tylenol) every 4-6 hours, not to exceed recommended dose - You may apply Cerave Healing Ointment,  Vaseline or Aquaphor as needed - If you have a lot of swelling you may take a Benadryl to help with this (this may cause drowsiness), not to exceed recommended dose. This may increase the risk of falls in people over 65 and may slow reaction time while driving, so it is not recommended to take before driving or operating machinery. - Sun Precautions - Wear a wide brim hat for the next week if outside  - Wear a sunblock with zinc or titanium dioxide at least SPF 50 daily  If you have any questions or concerns, please call the office and ask to speak with a nurse.   --------------------------------------------------------------------------------------------------------------  Wound Care Instructions  Cleanse wound gently with soap and water once a day then pat dry with clean gauze. Apply a thin coat of Petrolatum (petroleum jelly, "Vaseline") over the wound (unless you have an allergy to this). We recommend that you use a new, sterile tube of Vaseline. Do not pick or remove scabs. Do not remove the yellow or white "healing tissue" from the base of the wound.  Cover the wound with fresh, clean, nonstick gauze and secure with paper tape. You may use Band-Aids in place of gauze and tape if the wound is small enough, but would recommend trimming much of the tape off as there is often too much. Sometimes Band-Aids can irritate the skin.  You should call the office for your biopsy report after 1 week if you have not already been contacted.  If you experience any  problems, such as abnormal amounts of bleeding, swelling, significant bruising, significant pain, or evidence of infection, please call the office immediately.  FOR ADULT SURGERY PATIENTS: If you need something for pain relief you may take 1 extra strength Tylenol (acetaminophen) AND 2 Ibuprofen (  each) together every 4 hours as needed for pain. (do not take these if you are allergic to them or if you have a reason you should not take them.)  Typically, you may only need pain medication for 1 to 3 days.    Cryotherapy Aftercare  Wash gently with soap and water everyday.   Apply Vaseline and Band-Aid daily until healed.     Due to recent changes in healthcare laws, you may see results of your pathology and/or laboratory studies on MyChart before the doctors have had a chance to review them. We understand that in some cases there may be results that are confusing or concerning to you. Please understand that not all results are received at the same time and often the doctors may need to interpret multiple results in order to provide you with the best plan of care or course of treatment. Therefore, we ask that you please give [place] 2 business days to thoroughly review all your results before contacting the office for clarification. Should we see a critical lab result, you will be contacted sooner.   If You Need Anything After Your Visit  If you have any questions or concerns for your doctor, please call our main line at [phone] and press option 4 to reach your doctor's medical assistant. If no one answers, please leave a voicemail as directed and we will return your call as soon as possible. Messages left after 4 pm will be answered the following business day.   You may also send [place] a message via MyChart. We typically respond to MyChart messages within 1-2 business days.  For prescription refills, please ask your pharmacy to contact our office. Our fax number is [phone].  If you have an urgent issue when the clinic is closed that cannot wait until the next business day, you can page your doctor at the number below.    Please note that while we do our best to be available for urgent issues outside of office hours, we are not available 24/7.   If you have an urgent issue and are unable to reach [place], you may choose to seek medical care at your doctor's office, retail clinic, urgent care center, or emergency room.  If you have a  medical emergency, please immediately call 911 or go to the emergency department.  Pager Numbers  - Dr. Gwen Pounds: [phone]  - Dr. Neale Burly: [phone]  - Dr. Roseanne Reno: [phone]  In the event of inclement weather, please call our main line at [phone] for an update on the status of any delays or closures.  Dermatology Medication Tips: Please keep the boxes that topical medications come in in order to help keep track of the instructions about where and how to use these. Pharmacies typically print the medication instructions only on the boxes and not directly on the medication tubes.   If your medication is too expensive, please contact our office at [phone] option 4 or send [place] a message through MyChart.   We are unable to tell what your co-pay for medications will be in advance as this is different depending on your insurance coverage. However, we may be able to find a substitute medication at lower cost or fill out paperwork to  get insurance to cover a needed medication.   If a prior authorization is required to get your medication covered by your insurance company, please allow [place] 1-2 business days to complete this process.  Drug prices often vary depending on where the prescription is filled and some pharmacies may offer cheaper prices.  The website www.goodrx.com contains coupons for medications through different pharmacies. The prices here do not account for what the cost may be with help from insurance (it may be cheaper with your insurance), but the website can give you the price if you did not use any insurance.  - You can print the associated coupon and take it with your prescription to the pharmacy.  - You may also stop by our office during regular business hours and pick up a GoodRx coupon card.  - If you need your prescription sent electronically to a different pharmacy, notify our office through [facility] or by phone at [phone] option 4.     Si  Usted Necesita Algo Despus de Su Visita  Tambin puede enviarnos un mensaje a travs de Clinical cytogeneticist. Por lo general respondemos a los mensajes de MyChart en el transcurso de 1 a 2 das hbiles.  Para renovar recetas, por favor pida a su farmacia que se ponga en contacto con nuestra oficina. Annie Sable de fax es [place] [phone].  Si tiene un asunto urgente cuando la clnica est cerrada y que no puede esperar hasta el siguiente da hbil, puede llamar/localizar a su doctor(a) al nmero que aparece a continuacin.   Por favor, tenga en cuenta que aunque hacemos todo lo posible para estar disponibles para asuntos urgentes fuera del horario de [place], no estamos disponibles las 24 horas del da, los 7 [address]  [address] de la [place].   Si tiene un problema urgente y no puede comunicarse con nosotros, puede optar por buscar atencin mdica  en el consultorio de su doctor(a), en una clnica privada, en un centro de atencin urgente o en una sala de emergencias.  Si tiene Engineer, drilling, por favor llame inmediatamente al 911 o vaya a la sala de emergencias.  Nmeros de bper  - Dr. Gwen Pounds: [phone]  - Dra. Moye: [phone]  - Dra. Roseanne Reno: [phone]  En caso de inclemencias del [place], por favor llame a Lacy Duverney principal al [phone] para una actualizacin sobre el [place] de cualquier retraso o cierre.  Consejos para la medicacin en dermatologa: Por favor, guarde las cajas en las que vienen los medicamentos de uso tpico para ayudarle a seguir las instrucciones sobre dnde y cmo usarlos. Las farmacias generalmente imprimen las instrucciones del medicamento slo en las cajas y no directamente en los tubos del [place].   Si su medicamento es muy caro, por favor, pngase en contacto con Rolm Gala llamando al [phone] y presione la opcin 4 o envenos un mensaje a travs de Clinical cytogeneticist.   No podemos decirle cul ser su copago por los medicamentos por adelantado ya que  esto es diferente dependiendo de la cobertura de su seguro. Sin embargo, es posible que podamos encontrar un medicamento sustituto a Audiological scientist un formulario para que el seguro cubra el medicamento que se considera necesario.   Si se requiere una autorizacin previa para que su compaa de seguros [place] su medicamento, por favor permtanos de 1 a 2 das hbiles para completar [address]  Los precios de los medicamentos varan con frecuencia dependiendo del Environmental consultant de dnde se surte la receta y Careers adviser pueden  ofrecer precios ms baratos.  El sitio web www.goodrx.com tiene cupones para medicamentos de Airline pilot. Los precios aqu no tienen en cuenta lo que podra costar con la ayuda del seguro (puede ser ms barato con su seguro), pero el sitio web puede darle el precio si no utiliz Research scientist (physical sciences).  - Puede imprimir el cupn correspondiente y llevarlo con su receta a la farmacia.  - Tambin puede pasar por nuestra oficina durante el horario de atencin regular y Charity fundraiser una tarjeta de cupones de GoodRx.  - Si necesita que su receta se enve electrnicamente a una farmacia diferente, informe a nuestra oficina a travs de MyChart de [place] o por telfono llamando al [phone] y presione la opcin 4.

## 2022-08-27 NOTE — Progress Notes (Unsigned)
Follow-Up Visit   Subjective  [name] is a 72 y.o. female who presents for the following: check spots back >64yr, non healing, L leg few months no symptoms, check spots arms  The following portions of the chart were reviewed this encounter and updated as appropriate: medications, allergies, medical history  Review of Systems:  No other skin or systemic complaints except as noted in HPI or Assessment and Plan.  Objective  Well appearing patient in no apparent distress; mood and affect are within normal limits.   A focused examination was performed of the following areas: Back, left leg, bil arms, chest  Relevant exam findings are noted in the Assessment and Plan.  R upper arm 0.9cm med to dark brown thin papule     L post thigh x 1 Pink scaly macules      Assessment & Plan      Neoplasm of skin R upper arm  Epidermal / dermal shaving  Lesion diameter (cm):  0.9 Informed consent: discussed and consent obtained   Patient was prepped and draped in usual sterile fashion: area prepped with alcohol. Anesthesia: the lesion was anesthetized in a standard fashion   Anesthetic:  1% lidocaine w/ epinephrine 1-100,000 buffered w/ 8.4% NaHCO3 Instrument used: flexible razor blade   Hemostasis achieved with: pressure, aluminum chloride and electrodesiccation   Outcome: patient tolerated procedure well   Post-procedure details: wound care instructions given   Post-procedure details comment:  Ointment and small bandage applied  Specimen 1 - Surgical pathology Differential Diagnosis: D48.5 Nevus r/o Dysplastic Nevus  Check Margins: yes 0.9cm med to dark brown thin papule  AK (actinic keratosis) L post thigh x 1  AK over ISK  Destruction of lesion - L post thigh x 1  Destruction method: cryotherapy   Informed consent: discussed and consent obtained   Lesion destroyed using liquid nitrogen: Yes   Region frozen until ice ball extended beyond lesion: Yes    Outcome: patient tolerated procedure well with no complications   Post-procedure details: wound care instructions given   Additional details:  Prior to procedure, discussed risks of blister formation, small wound, skin dyspigmentation, or rare scar following cryotherapy. Recommend Vaseline ointment to treated areas while healing.    EXCORIATION back Exam: excoriation   Treatment Plan: Recommend Vaseline and bandaid qd, recheck on f/u. Call for changes other than healing.  ACTINIC DAMAGE WITH PRECANCEROUS ACTINIC KERATOSES Counseling for Topical Chemotherapy Management: Patient exhibits: - Severe, confluent actinic changes with pre-cancerous actinic keratoses that is secondary to cumulative UV radiation exposure over time - Condition that is severe; chronic, not at goal. - diffuse scaly erythematous macules and papules with underlying dyspigmentation - Discussed Prescription "Field Treatment" topical Chemotherapy for Severe, Chronic Confluent Actinic Changes with Pre-Cancerous Actinic Keratoses Field treatment involves treatment of an entire area of skin that has confluent Actinic Changes (Sun/ Ultraviolet light damage) and PreCancerous Actinic Keratoses by method of PhotoDynamic Therapy (PDT) and/or prescription Topical Chemotherapy agents such as 5-fluorouracil, 5-fluorouracil/calcipotriene, and/or imiquimod.  The purpose is to decrease the number of clinically evident and subclinical PreCancerous lesions to prevent progression to development of skin cancer by chemically destroying early precancer changes that may or may not be visible.  It has been shown to reduce the risk of developing skin cancer in the treated area. As a result of treatment, redness, scaling, crusting, and open sores may occur during treatment course. One or more than one of these methods may be used and may have  to be used several times to control, suppress and eliminate the PreCancerous changes. Discussed treatment  course, expected reaction, and possible side effects. - Recommend daily broad spectrum sunscreen SPF 30+ to sun-exposed areas, reapply every 2 hours as needed.  - Staying in the shade or wearing long sleeves, sun glasses (UVA+UVB protection) and wide brim hats (4-inch brim around the entire circumference of the hat) are also recommended. - Call for new or changing lesions.  - Recommend blue light PDT with debridement to full arms, chest    Return for blue light with debridement to full arms, chest.  I, Sonya Hupman, RMA, am acting as scribe for Darden Dates, MD .   Documentation: I have reviewed the above documentation for accuracy and completeness, and I agree with the above.  Darden Dates, MD

## 2022-09-03 ENCOUNTER — Telehealth: Payer: Self-pay

## 2022-09-03 NOTE — Telephone Encounter (Signed)
-----   Message from Sandi Mealy, MD sent at 09/03/2022  1:14 PM EDT ----- Skin , right upper arm SOLAR LENTIGO WITH INCREASED NUMBERS OF MELANOCYTES, SEE DESCRIPTION  Sun spot/freckle on sun damaged skin. Monitor for anything coming back.   MAs please call. Thank you!

## 2022-09-03 NOTE — Telephone Encounter (Signed)
Called patient. N/A. LMOVM with biopsy results. C/B if any questions.

## 2022-10-09 ENCOUNTER — Other Ambulatory Visit: Payer: Self-pay | Admitting: Physician Assistant

## 2022-10-09 ENCOUNTER — Other Ambulatory Visit: Payer: Self-pay

## 2022-10-09 ENCOUNTER — Ambulatory Visit
Admission: RE | Admit: 2022-10-09 | Discharge: 2022-10-09 | Disposition: A | Discharge status: Home / Self Care | Payer: Worker's Compensation | Source: Ambulatory Visit | Attending: Physician Assistant | Admitting: Physician Assistant

## 2022-10-09 DIAGNOSIS — S0990XA Unspecified injury of head, initial encounter: Secondary | ICD-10-CM

## 2022-10-09 MED ORDER — CEPHALEXIN 500 MG PO CAPS
500.0000 mg | ORAL_CAPSULE | Freq: Four times a day (QID) | ORAL | 0 refills | Status: DC
  Filled 2022-10-09: qty 28, 7d supply, fill #0

## 2022-10-10 ENCOUNTER — Other Ambulatory Visit: Payer: Self-pay

## 2022-10-10 MED ORDER — METHOCARBAMOL 500 MG PO TABS
500.0000 mg | ORAL_TABLET | Freq: Every day | ORAL | 0 refills | Status: DC
  Filled 2022-10-10: qty 15, 15d supply, fill #0

## 2022-10-14 ENCOUNTER — Other Ambulatory Visit: Payer: Self-pay

## 2022-10-14 MED ORDER — METHOCARBAMOL 500 MG PO TABS
500.0000 mg | ORAL_TABLET | Freq: Every day | ORAL | 0 refills | Status: DC
  Filled 2022-10-14: qty 15, 15d supply, fill #0

## 2022-10-18 ENCOUNTER — Encounter: Payer: PRIVATE HEALTH INSURANCE | Attending: Physician Assistant | Admitting: Physician Assistant

## 2022-10-18 DIAGNOSIS — L97812 Non-pressure chronic ulcer of other part of right lower leg with fat layer exposed: Secondary | ICD-10-CM | POA: Diagnosis not present

## 2022-10-18 DIAGNOSIS — I10 Essential (primary) hypertension: Secondary | ICD-10-CM | POA: Insufficient documentation

## 2022-10-18 DIAGNOSIS — Z85828 Personal history of other malignant neoplasm of skin: Secondary | ICD-10-CM | POA: Diagnosis not present

## 2022-10-18 DIAGNOSIS — S81811A Laceration without foreign body, right lower leg, initial encounter: Secondary | ICD-10-CM | POA: Insufficient documentation

## 2022-10-18 DIAGNOSIS — W19XXXA Unspecified fall, initial encounter: Secondary | ICD-10-CM | POA: Diagnosis not present

## 2022-10-19 NOTE — Progress Notes (Signed)
SHARNIECE, BELLINGER ([id number]) [phone] [id number] Page 1 of 5 Visit Report for 10/18/2022 Abuse Risk Screen Details Patient Name: Date of Service: [name], [place] [name] 10/18/2022 7:30 A M Medical Record Number: [id number] Patient Account Number: [id number] Date of Birth/Sex: Treating RN: 1950/07/01 (72 y.o. Female) [facility] Liv Rallis: Daniel Nones Other Clinician: Referring Wynn Kernes: Treating Mirely Pangle/Extender: Leane Call NT, HO BSO N Weeks in Treatment: 0 Abuse Risk Screen Items Answer ABUSE RISK SCREEN: Has anyone close to you tried to hurt or harm you recentlyo No Do you feel uncomfortable with anyone in your familyo No Has anyone forced you do things that you didnt want to doo No Electronic Signature(s) Signed: 10/18/2022 12:16:56 PM By: Elliot Gurney, BSN, RN, CWS, Kim RN, BSN Entered By: Elliot Gurney, BSN, RN, CWS, Kim on 10/18/2022 08:07:43 -------------------------------------------------------------------------------- Activities of Daily Living Details Patient Name: Date of Service: [name], [name] 10/18/2022 7:30 A M Medical Record Number: [id number] Patient Account Number: [id number] Date of Birth/Sex: Treating RN: [birth year] (72 y.o. Female) [facility] Jorge Amparo: Daniel Nones Other Clinician: Referring Marvelle Span: Treating Sylvia Kondracki/Extender: Leane Call NT, HO BSO N Weeks in Treatment: 0 Activities of Daily Living Items Answer Activities of Daily Living (Please select one for each item) Drive Automobile Completely Able T Medications ake Completely Able Use T elephone Completely Able Care for Appearance Completely Able Use T oilet Completely Able Bath / Shower Completely Able Dress Self Completely Able Feed Self Completely Able Walk Completely Able Get In / Out Bed Completely Able Housework Completely [name], [name] ([id number]) [id number] Page 2 of 5 Prepare Meals Completely  Able Handle Money Completely Able Shop for Self Completely Able Electronic Signature(s) Signed: 10/18/2022 12:16:56 PM By: Elliot Gurney, BSN, RN, CWS, Kim RN, BSN Entered By: Elliot Gurney, BSN, RN, CWS, Kim on 10/18/2022 08:07:53 -------------------------------------------------------------------------------- Education Screening Details Patient Name: Date of Service: [name], [name] 10/18/2022 7:30 A M Medical Record Number: [id number] Patient Account Number: [id number] Date of Birth/Sex: Treating RN: 1951-03-12 (72 y.o. Female) [facility] Sharis Keeran: Daniel Nones Other Clinician: Referring Zaryiah Barz: Treating Amalee Olsen/Extender: Leane Call NT, HO BSO N Weeks in Treatment: 0 Learning Preferences/Education Level/Primary Language Learning Preference: Explanation, Demonstration Preferred Language: English Cognitive Barrier Language Barrier: No Translator Needed: No Memory Deficit: No Emotional Barrier: No Physical Barrier Impaired Vision: No Impaired Hearing: No Decreased Hand dexterity: No Knowledge/Comprehension Knowledge Level: High Comprehension Level: High Ability to understand written instructions: High Ability to understand verbal instructions: High Motivation Anxiety Level: Calm Cooperation: Cooperative Education Importance: Acknowledges Need Interest in Health Problems: Asks Questions Perception: Coherent Willingness to Engage in Self-Management High Activities: Readiness to Engage in Self-Management High Activities: Electronic Signature(s) Signed: 10/18/2022 12:16:56 PM By: Elliot Gurney, BSN, RN, CWS, Kim RN, BSN Entered By: Elliot Gurney, BSN, RN, CWS, Kim on 10/18/2022 08:08:14 [place] ([id number]) [id number] Page 3 of 5 -------------------------------------------------------------------------------- Fall Risk Assessment Details Patient Name: Date of Service: [name], [place] [name] 10/18/2022 7:30 A M Medical Record Number:  [id number] Patient Account Number: [id number] Date of Birth/Sex: Treating RN: 07/10/1950 (72 y.o. Female) [facility] Florie Carico: Daniel Nones Other Clinician: Referring Isabela Nardelli: Treating Cesilia Shinn/Extender: Leane Call NT, HO BSO N Weeks in Treatment: 0 Fall Risk Assessment Items Have you had 2 or more falls in the last 12 monthso 0 Yes Have you had any fall that resulted in injury in the last 12 monthso 0 Yes FALLS RISK SCREEN History of falling - immediate  or within 3 months 25 Yes Secondary diagnosis (Do you have 2 or more medical diagnoseso) 0 No Ambulatory aid None/bed rest/wheelchair/nurse 0 Yes Crutches/cane/walker 0 No Furniture 0 No Intravenous therapy Access/Saline/Heparin Lock 0 No Gait/Transferring Normal/ bed rest/ wheelchair 0 Yes Weak (short steps with or without shuffle, stooped but able to lift head while walking, may seek 0 No support from furniture) Impaired (short steps with shuffle, may have difficulty arising from chair, head down, impaired 0 No balance) Mental Status Oriented to own ability 0 Yes Electronic Signature(s) Signed: 10/18/2022 12:16:56 PM By: Elliot Gurney, BSN, RN, CWS, Kim RN, BSN Entered By: Elliot Gurney, BSN, RN, CWS, Kim on 10/18/2022 16:10:96 -------------------------------------------------------------------------------- Foot Assessment Details Patient Name: Date of Service: [name], [name] 10/18/2022 7:30 A M Medical Record Number: [id number] Patient Account Number: [id number] Date of Birth/Sex: Treating RN: 01/27/1951 (72 y.o. Female) [facility] Jaline Pincock: Daniel Nones Other Clinician: Referring Malorie Bigford: Treating Jaasiel [place]/Extender: Leane Call NT, HO BSO N Weeks in Treatment: 0 Foot Assessment Items Site Locations [name], [name] ([id number]) [phone] [id number] Page 4 of 5 + = Sensation present, - = Sensation absent, C = Callus, U = Ulcer R = Redness, W = Warmth, M = Maceration, PU =  Pre-ulcerative lesion F = Fissure, S = Swelling, D = Dryness Assessment Right: Left: Other Deformity: No No Prior Foot Ulcer: No No Prior Amputation: No No Charcot Joint: No No Ambulatory Status: Ambulatory Without Help Gait: Steady Electronic Signature(s) Signed: 10/18/2022 12:16:56 PM By: Elliot Gurney, BSN, RN, CWS, Kim RN, BSN Entered By: Elliot Gurney, BSN, RN, CWS, Kim on 10/18/2022 10:27:25 -------------------------------------------------------------------------------- Nutrition Risk Screening Details Patient Name: Date of Service: [name], [name] 10/18/2022 7:30 A M Medical Record Number: [id number] Patient Account Number: [id number] Date of Birth/Sex: Treating RN: 06-Dec-1950 (71 y.o. Female) [facility] Makalynn Berwanger: Daniel Nones Other Clinician: Referring Safiyyah Vasconez: Treating Tyja Gortney/Extender: Leane Call NT, HO BSO N Weeks in Treatment: 0 Height (in): 63 Weight (lbs): 128 Body Mass Index (BMI): 22.7 Nutrition Risk Screening Items Score Screening NUTRITION RISK SCREEN: I have an illness or condition that made me change the kind and/or amount of food I eat 0 No I eat fewer than two meals per day 0 No I eat few fruits and vegetables, or milk products 0 No I have three or more drinks of beer, liquor or wine almost every day 0 No I have tooth or mouth problems that make it hard for me to eat 0 No I don't always have enough money to buy the food I need 0 No [name], [name] ([id number]) [id number] Page 5 of 5 I eat alone most of the time 0 No I take three or more different prescribed or over-the-counter drugs a day 0 No Without wanting to, I have lost or gained 10 pounds in the last six months 0 No I am not always physically able to shop, cook and/or feed myself 0 No Nutrition Protocols Good Risk Protocol 0 No interventions needed Moderate Risk Protocol High Risk Proctocol Risk Level: Good Risk Score: 0 Electronic Signature(s) Signed:  10/18/2022 12:16:56 PM By: Elliot Gurney, BSN, RN, CWS, Kim RN, BSN Entered By: Elliot Gurney, BSN, RN, CWS, Kim on 10/18/2022 56:43:32

## 2022-10-19 NOTE — Progress Notes (Signed)
NINFA, SEARCH ([id number]) [id number] Page 1 of 11 Visit Report for 10/18/2022 Allergy List Details Patient Name: Date of Service: [name], [place] [name] 10/18/2022 7:30 A M Medical Record Number: [id number] Patient Account Number: [id number] Date of Birth/Sex: Treating RN: [birth year] (72 y.o. Female) Huel Coventry Primary Care Ronneisha Jett: Daniel Nones Other Clinician: Referring Charde Macfarlane: Treating Irma Delancey/Extender: Leane Call NT, HO BSO N Weeks in Treatment: 0 Allergies Active Allergies iodine Levaquin bacitracin Neosporin (neo-bac-polym) Allergy Notes Electronic Signature(s) Signed: 10/18/2022 12:16:56 PM By: Elliot Gurney, BSN, RN, CWS, Kim RN, BSN Entered By: Elliot Gurney, BSN, RN, CWS, Kim on 10/18/2022 08:03:18 -------------------------------------------------------------------------------- Arrival Information Details Patient Name: Date of Service: [name], [name] 10/18/2022 7:30 A M Medical Record Number: [id number] Patient Account Number: [id number] Date of Birth/Sex: Treating RN: November 17, 1950 (72 y.o. Female) Huel Coventry Primary Care Tametra Ahart: Daniel Nones Other Clinician: Referring Haliegh Khurana: Treating Bryam Taborda/Extender: Leane Call NT, HO BSO N Weeks in Treatment: 0 Visit Information Patient Arrived: Ambulatory Arrival Time: 07:55 Accompanied By: self Transfer Assistance: None Patient Identification Verified: Yes Secondary Verification Process Completed: Yes Patient Requires Transmission-Based Precautions: No Patient Has AlertsJASENIA, [name] ([id number]) [phone].pdf Page 2 of 11 Patient Alerts: 10/18/22 [place]>220 Electronic Signature(s) Signed: 10/18/2022 12:16:56 PM By: Elliot Gurney, BSN, RN, CWS, Kim RN, BSN Entered By: Elliot Gurney, BSN, RN, CWS, Kim on 10/18/2022 59:56:38 -------------------------------------------------------------------------------- Clinic Level of Care Assessment Details Patient Name: Date of Service: [name], [place]  [name] 10/18/2022 7:30 A M Medical Record Number: [id number] Patient Account Number: [id number] Date of Birth/Sex: Treating RN: Jan 20, 1951 (72 y.o. Female) Huel Coventry Primary Care Isatou Agredano: Daniel Nones Other Clinician: Referring Dotti Busey: Treating Ellis Mehaffey/Extender: Leane Call NT, HO BSO N Weeks in Treatment: 0 Clinic Level of Care Assessment Items TOOL 1 Quantity Score []  - 0 Use when EandM and Procedure is performed on INITIAL visit ASSESSMENTS - Nursing Assessment / Reassessment X- 1 20 General Physical Exam (combine w/ comprehensive assessment (listed just below) when performed on new pt. evals) X- 1 25 Comprehensive Assessment (HX, ROS, Risk Assessments, Wounds Hx, etc.) ASSESSMENTS - Wound and Skin Assessment / Reassessment []  - 0 Dermatologic / Skin Assessment (not related to wound area) ASSESSMENTS - Ostomy and/or Continence Assessment and Care []  - 0 Incontinence Assessment and Management []  - 0 Ostomy Care Assessment and Management (repouching, etc.) PROCESS - Coordination of Care X - Simple Patient / Family Education for ongoing care 1 15 []  - 0 Complex (extensive) Patient / Family Education for ongoing care X- 1 10 Staff obtains Chiropractor, Records, T Results / Process Orders est []  - 0 Staff telephones HHA, Nursing Homes / Clarify orders / etc []  - 0 Routine Transfer to another Facility (non-emergent condition) []  - 0 Routine Hospital Admission (non-emergent condition) X- 1 15 New Admissions / Manufacturing engineer / Ordering NPWT Apligraf, etc. , []  - 0 Emergency Hospital Admission (emergent condition) PROCESS - Special Needs []  - 0 Pediatric / Minor Patient Management []  - 0 Isolation Patient Management []  - 0 Hearing / Language / Visual special needs []  - 0 Assessment of Community assistance (transportation, D/C planning, etc.) []  - 0 Additional assistance / Altered mentation []  - 0 Support Surface(s) Assessment (bed, cushion, seat,  etc.) INTERVENTIONS - Miscellaneous []  - 0 External ear exam AZZIE, HUNSAKER ([id number]) [id number] Page 3 of 11 []  - 0 Patient Transfer (multiple staff / Nurse, adult / Similar devices) []  - 0 Simple Staple / Suture removal (25 or less) []  - 0 Complex  Staple / Suture removal (26 or more) []  - 0 Hypo/Hyperglycemic Management (do not check if billed separately) X- 1 15 Ankle / Brachial Index (ABI) - do not check if billed separately Has the patient been seen at the hospital within the last three years: Yes Total Score: 100 Level Of Care: New/Established - Level 3 Electronic Signature(s) Signed: 10/18/2022 12:16:56 PM By: Elliot Gurney, BSN, RN, CWS, Kim RN, BSN Entered By: Elliot Gurney, BSN, RN, CWS, Kim on 10/18/2022 08:59:38 -------------------------------------------------------------------------------- Encounter Discharge Information Details Patient Name: Date of Service: [name], [name] 10/18/2022 7:30 A M Medical Record Number: [id number] Patient Account Number: [id number] Date of Birth/Sex: Treating RN: [birth year] (72 y.o. Female) Huel Coventry Primary Care Makisha Marrin: Daniel Nones Other Clinician: Referring Minetta Krisher: Treating Mabry Santarelli/Extender: Leane Call NT, HO BSO N Weeks in Treatment: 0 Encounter Discharge Information Items Post Procedure Vitals Discharge Condition: Stable Temperature (F): 98.1 Ambulatory Status: Ambulatory Pulse (bpm): 76 Discharge Destination: Home Respiratory Rate (breaths/min): 16 Transportation: Private Auto Blood Pressure (mmHg): 170/115 Schedule Follow-up Appointment: Yes Clinical Summary of Care: Electronic Signature(s) Signed: 10/18/2022 12:16:56 PM By: Elliot Gurney, BSN, RN, CWS, Kim RN, BSN Entered By: Elliot Gurney, BSN, RN, CWS, Kim on 10/18/2022 09:04:38 -------------------------------------------------------------------------------- Lower Extremity Assessment Details Patient Name: Date of Service: [name], [name] 10/18/2022 7:30 A  M Medical Record Number: [id number] Patient Account Number: [id number] Date of Birth/Sex: Treating RN: 1951/04/12 (72 y.o. Female) Huel Coventry Primary Care Silvanna Ohmer: Daniel Nones Other Clinician: Referring Nathaneal Sommers: Treating Cas Tracz/Extender: Leane Call NT, HO BSO N Weeks in Treatment: 0 [name], [name] ([id number]) [id number] Page 4 of 11 Edema Assessment Assessed: [Left: No] [Right: No] [Left: Edema] [Right: :] Calf Left: Right: Point of Measurement: 31 cm From Medial Instep 33 cm Ankle Left: Right: Point of Measurement: 8 cm From Medial Instep 20.1 cm Knee To Floor Left: Right: From Medial Instep 39 cm Vascular Assessment Pulses: Dorsalis Pedis Palpable: [Right:Yes] Doppler Audible: [Right:Yes] Posterior Tibial Palpable: [Right:Yes] Doppler Audible: [Right:Yes] Blood Pressure: Ankle: [Right:Dorsalis Pedis: 220] Notes Non-compressible >220 Electronic Signature(s) Signed: 10/18/2022 12:16:56 PM By: Elliot Gurney, BSN, RN, CWS, Kim RN, BSN Entered By: Elliot Gurney, BSN, RN, CWS, Kim on 10/18/2022 08:17:56 -------------------------------------------------------------------------------- Multi Wound Chart Details Patient Name: Date of Service: [name], [name] 10/18/2022 7:30 A M Medical Record Number: [id number] Patient Account Number: [id number] Date of Birth/Sex: Treating RN: [birth year] (72 y.o. Female) Huel Coventry Primary Care Burhanuddin Kohlmann: Daniel Nones Other Clinician: Referring Jhoselyn Ruffini: Treating Davion Meara/Extender: Leane Call NT, HO BSO N Weeks in Treatment: 0 Vital Signs Height(in): 63 Pulse(bpm): 76 Weight(lbs): 128 Blood Pressure(mmHg): 170/115 Body Mass Index(BMI): 22.7 Temperature(F): 98.1 Respiratory Rate(breaths/min): 16 [1:Photos:] [N/A:N/A] Right, Midline Lower Leg N/A N/A Wound Location: Trauma N/A N/A Wounding Event: Trauma, Other N/A N/A Primary Etiology: Chronic sinus problems/congestion N/A N/A Comorbid History: 10/08/2022  N/A N/A Date Acquired: 0 N/A N/A Weeks of Treatment: Open N/A N/A Wound Status: No N/A N/A Wound Recurrence: Yes N/A N/A Clustered Wound: 3 N/A N/A Clustered Quantity: 7x4.5x0.3 N/A N/A Measurements L x W x D (cm) 24.74 N/A N/A A (cm) : rea 7.422 N/A N/A Volume (cm) : 6 Starting Position 1 (o'clock): 9 Ending Position 1 (o'clock): 0.8 Maximum Distance 1 (cm): 3 Starting Position 2 (o'clock): 6 Ending Position 2 (o'clock): 0.2 Maximum Distance 2 (cm): Yes N/A N/A Undermining: Full Thickness With Exposed Support N/A N/A Classification: Structures Medium N/A N/A Exudate Amount: Serous N/A N/A Exudate Type: amber N/A N/A Exudate Color: Flat and Intact N/A N/A Wound  Margin: Medium (34-66%) N/A N/A Granulation Amount: Red N/A N/A Granulation Quality: Medium (34-66%) N/A N/A Necrotic Amount: Fat Layer (Subcutaneous Tissue): Yes N/A N/A Exposed Structures: Muscle: Yes Fascia: No Tendon: No Joint: No Bone: No Debridement - Excisional N/A N/A Debridement: Pre-procedure Verification/Time Out 08:40 N/A N/A Taken: Lidocaine N/A N/A Pain Control: Subcutaneous, Slough N/A N/A Tissue Debrided: Skin/Subcutaneous Tissue N/A N/A Level: 12.36 N/A N/A Debridement A (sq cm): rea Curette N/A N/A Instrument: Minimum N/A N/A Bleeding: Pressure N/A N/A Hemostasis A chieved: Procedure was tolerated well N/A N/A Debridement Treatment Response: 7x4.5x0.1 N/A N/A Post Debridement Measurements L x W x D (cm) 2.474 N/A N/A Post Debridement Volume: (cm) Undermining and depth measured in N/A N/A Assessment Notes: center of wound. Debridement N/A N/A Procedures Performed: Treatment Notes Wound #1 (Lower Leg) Wound Laterality: Right, Midline Cleanser Vashe 5.8 (oz) Discharge Instruction: Use vashe 5.8 (oz) as directed Peri-Wound Care Topical Primary Dressing Prisma 4.34 (in) Discharge Instruction: Apply into center depth of wound Moisten w/normal saline or  sterile water; Cover wound as directed. Do not remove from wound bed. Xeroform-HBD 2x2 (in/in) Discharge Instruction: Apply over entire wound as directed Secondary Dressing (BORDER) Zetuvit Plus SILICONE BORDER Dressing 5x5 (in/in) Discharge Instruction: Please do not put silicone bordered dressings under wraps. Use non-bordered dressing only. Secured With GENNIFER, PORCHE ([id number]) [id number] Page 6 of 11 Compression Wrap Compression Stockings Add-Ons Electronic Signature(s) Signed: 10/18/2022 12:12:42 PM By: Elliot Gurney, BSN, RN, CWS, Kim RN, BSN Entered By: Elliot Gurney, BSN, RN, CWS, Kim on 10/18/2022 12:12:42 -------------------------------------------------------------------------------- Multi-Disciplinary Care Plan Details Patient Name: Date of Service: [name], [place] [name] 10/18/2022 7:30 A M Medical Record Number: [id number] Patient Account Number: [id number] Date of Birth/Sex: Treating RN: 1951-04-23 (72 y.o. Female) Huel Coventry Primary Care Linnette Panella: Daniel Nones Other Clinician: Referring Levia Waltermire: Treating Marithza Malachi/Extender: Leane Call NT, HO BSO N Weeks in Treatment: 0 Active Inactive Abuse / Safety / Falls / Self Care Management Nursing Diagnoses: Potential for falls Goals: Patient/caregiver will verbalize understanding of skin care regimen Date Initiated: 10/18/2022 Target Resolution Date: 10/18/2022 Goal Status: Active Interventions: Assess fall risk on admission and as needed Notes: Necrotic Tissue Nursing Diagnoses: Impaired tissue integrity related to necrotic/devitalized tissue Knowledge deficit related to management of necrotic/devitalized tissue Goals: Necrotic/devitalized tissue will be minimized in the wound bed Date Initiated: 10/18/2022 Target Resolution Date: 10/18/2022 Goal Status: Active Patient/caregiver will verbalize understanding of reason and process for debridement of necrotic tissue Date Initiated: 10/18/2022 Target  Resolution Date: 10/18/2022 Goal Status: Active Interventions: Assess patient pain level pre-, during and post procedure and prior to discharge Provide education on necrotic tissue and debridement process Treatment Activities: Apply topical anesthetic as ordered : 10/18/2022 Excisional debridement : 10/18/2022 Notes: [name], [name] ([id number]) [phone].pdf Page 7 of 11 Orientation to the Wound Care Program Nursing Diagnoses: Knowledge deficit related to the wound healing center program Goals: Patient/caregiver will verbalize understanding of the Wound Healing Center Program Date Initiated: 10/18/2022 Target Resolution Date: 10/18/2022 Goal Status: Active Interventions: Provide education on orientation to the wound center Notes: Soft Tissue Infection Nursing Diagnoses: Impaired tissue integrity Potential for infection: soft tissue Goals: Patient will remain free of wound infection Date Initiated: 10/18/2022 Target Resolution Date: 10/18/2022 Goal Status: Active Patient/caregiver will verbalize understanding of or measures to prevent infection and contamination in the home setting Date Initiated: 10/18/2022 Target Resolution Date: 10/18/2022 Goal Status: Active Signs and symptoms of infection will be recognized early to allow for prompt treatment Date Initiated: 10/18/2022 Target  Resolution Date: 10/18/2022 Goal Status: Active Interventions: Assess signs and symptoms of infection every visit Notes: Wound/Skin Impairment Nursing Diagnoses: Impaired tissue integrity Goals: Patient/caregiver will verbalize understanding of skin care regimen Date Initiated: 10/18/2022 Target Resolution Date: 10/18/2022 Goal Status: Active Ulcer/skin breakdown will have a volume reduction of 30% by week 4 Date Initiated: 10/18/2022 Target Resolution Date: 11/08/2022 Goal Status: Active Ulcer/skin breakdown will have a volume reduction of 50% by week 8 Date Initiated:  10/18/2022 Target Resolution Date: 12/06/2022 Goal Status: Active Ulcer/skin breakdown will have a volume reduction of 80% by week 12 Date Initiated: 10/18/2022 Target Resolution Date: 01/03/2023 Goal Status: Active Ulcer/skin breakdown will heal within 14 weeks Date Initiated: 10/18/2022 Target Resolution Date: 01/16/2023 Goal Status: Active Interventions: Assess patient/caregiver ability to obtain necessary supplies Assess patient/caregiver ability to perform ulcer/skin care regimen upon admission and as needed Assess ulceration(s) every visit Provide education on ulcer and skin care Treatment Activities: Referred to DME Joclyn Alsobrook for dressing supplies : 10/18/2022 Skin care regimen initiated : 10/18/2022 Notes: Electronic Signature(s) Signed: 10/18/2022 12:16:56 PM By: Elliot Gurney, BSN, RN, CWS, Kim RN, BSN Entered By: Elliot Gurney, BSN, RN, CWS, Kim on 10/18/2022 09:02:46 [place] ([id number]) [phone].pdf Page 8 of 11 -------------------------------------------------------------------------------- Pain Assessment Details Patient Name: Date of Service: [name], [place] [name] 10/18/2022 7:30 A M Medical Record Number: [id number] Patient Account Number: [id number] Date of Birth/Sex: Treating RN: 1950/12/07 (72 y.o. Female) Huel Coventry Primary Care Dalayah Deahl: Daniel Nones Other Clinician: Referring Lakiya Cottam: Treating Donnie Gedeon/Extender: Leane Call NT, HO BSO N Weeks in Treatment: 0 Active Problems Location of Pain Severity and Description of Pain Patient Has Paino Yes Site Locations Pain Location: Generalized Pain Rate the pain. Current Pain Level: 4 Character of Pain Describe the Pain: Throbbing, Other: itching Pain Management and Medication Current Pain Management: Notes Back and neck pain. Electronic Signature(s) Signed: 10/18/2022 12:16:56 PM By: Elliot Gurney, BSN, RN, CWS, Kim RN, BSN Entered By: Elliot Gurney, BSN, RN, CWS, Kim on 10/18/2022  08:01:02 -------------------------------------------------------------------------------- Patient/Caregiver Education Details Patient Name: Date of Service: [name], [name] 6/14/2024andnbsp7:30 A Keane Scrape ([id number]) [id number] Page 9 of 11 Medical Record Number: [id number] Patient Account Number: [id number] Date of Birth/Gender: Treating RN: March 15, 1951 (72 y.o. Female) Huel Coventry Primary Care Physician: Daniel Nones Other Clinician: Referring Physician: Treating Physician/Extender: Leane Call NT, HO BSO N Weeks in Treatment: 0 Education Assessment Education Provided To: Patient Education Topics Provided Welcome T [facility] Packet: o Handouts: The [facility] Healing Pledge form, Welcome T [facility] o Methods: Printed Responses: State content correctly Wound Debridement: Handouts: Wound Debridement Methods: Explain/Verbal Responses: State content correctly Wound/Skin Impairment: Handouts: Caring for Your Ulcer Methods: Explain/Verbal Responses: State content correctly Electronic Signature(s) Signed: 10/18/2022 12:16:56 PM By: Elliot Gurney, BSN, RN, CWS, Kim RN, BSN Entered By: Elliot Gurney, BSN, RN, CWS, Kim on 10/18/2022 09:03:23 -------------------------------------------------------------------------------- Wound Assessment Details Patient Name: Date of Service: [name], [name] 10/18/2022 7:30 A M Medical Record Number: [id number] Patient Account Number: [id number] Date of Birth/Sex: Treating RN: 1951-04-20 (72 y.o. Female) Huel Coventry Primary Care Orman Matsumura: Daniel Nones Other Clinician: Referring Johnetta Sloniker: Treating Kelin Borum/Extender: Leane Call NT, HO BSO N Weeks in Treatment: 0 Wound Status Wound Number: 1 Primary Etiology: Trauma, Other Wound Location: Right, Midline Lower Leg Wound Status: Open Wounding Event: Trauma Comorbid History: Chronic sinus problems/congestion Date Acquired:  10/08/2022 Weeks Of Treatment: 0 Clustered Wound: Yes Photos [name], [name] ([id number]) [phone].pdf Page 10 of 11 Wound  Measurements Length: (cm) 7 Width: (cm) 4.5 Depth: (cm) 0.3 Clustered Quantity: 3 Area: (cm) 24.74 Volume: (cm) 7.422 % Reduction in Area: % Reduction in Volume: Undermining: Yes Location 1 Starting Position (o'clock): 6 Ending Position (o'clock): 9 Maximum Distance: (cm) 0.8 Location 2 Starting Position (o'clock): 3 Ending Position (o'clock): 6 Maximum Distance: (cm) 0.2 Wound Description Classification: Full Thickness With Exposed Suppo Wound Margin: Flat and Intact Exudate Amount: Medium Exudate Type: Serous Exudate Color: amber rt Structures Foul Odor After Cleansing: No Slough/Fibrino No Wound Bed Granulation Amount: Medium (34-66%) Exposed Structure Granulation Quality: Red Fascia Exposed: No Necrotic Amount: Medium (34-66%) Fat Layer (Subcutaneous Tissue) Exposed: Yes Necrotic Quality: Adherent Slough Tendon Exposed: No Muscle Exposed: Yes Necrosis of Muscle: No Joint Exposed: No Bone Exposed: No Assessment Notes Undermining and depth measured in center of wound. Treatment Notes Wound #1 (Lower Leg) Wound Laterality: Right, Midline Cleanser Vashe 5.8 (oz) Discharge Instruction: Use vashe 5.8 (oz) as directed Peri-Wound Care Topical Primary Dressing Prisma 4.34 (in) Discharge Instruction: Apply into center depth of wound Moisten w/normal saline or sterile water; Cover wound as directed. Do not remove from wound bed. Xeroform-HBD 2x2 (in/in) Discharge Instruction: Apply over entire wound as directed Secondary Dressing (BORDER) Zetuvit Plus SILICONE BORDER Dressing 5x5 (in/in) Discharge Instruction: Please do not put silicone bordered dressings under wraps. Use non-bordered dressing only. Secured With Compression Wrap Compression Stockings [name], [name] ([id number]) [id number]  Page 11 of 11 Add-Ons Electronic Signature(s) Signed: 10/18/2022 12:16:56 PM By: Elliot Gurney, BSN, RN, CWS, Kim RN, BSN Entered By: Elliot Gurney, BSN, RN, CWS, Kim on 10/18/2022 08:48:33 -------------------------------------------------------------------------------- Vitals Details Patient Name: Date of Service: [name], [name] 10/18/2022 7:30 A M Medical Record Number: [id number] Patient Account Number: [id number] Date of Birth/Sex: Treating RN: [birth year] (72 y.o. Female) Huel Coventry Primary Care Skylene Deremer: Daniel Nones Other Clinician: Referring Ja Ohman: Treating Tahni Porchia/Extender: Leane Call NT, HO BSO N Weeks in Treatment: 0 Vital Signs Time Taken: 08:01 Temperature (F): 98.1 Height (in): 63 Pulse (bpm): 76 Weight (lbs): 128 Respiratory Rate (breaths/min): 16 Body Mass Index (BMI): 22.7 Blood Pressure (mmHg): 170/115 Reference Range: 80 - 120 mg / dl Notes Patient state she has white coat syndrome. Electronic Signature(s) Signed: 10/18/2022 12:16:56 PM By: Elliot Gurney, BSN, RN, CWS, Kim RN, BSN Entered By: Elliot Gurney, BSN, RN, CWS, Kim on 10/18/2022 08:02:01

## 2022-10-20 NOTE — Progress Notes (Signed)
ALANTA, GROENE ([id number]) [id number] Page 1 of 10 Visit Report for 10/18/2022 Chief Complaint Document Details Patient Name: Date of Service: [name], [place] [name] 10/18/2022 7:30 A M Medical Record Number: [id number] Patient Account Number: [id number] Date of Birth/Sex: Treating RN: 08-Mar-1951 (72 y.o. Female) Huel Coventry Primary Care Provider: Daniel Nones Other Clinician: Referring Provider: Treating Provider/Extender: Leane Call NT, HO BSO N Weeks in Treatment: 0 Information Obtained from: Patient Chief Complaint Right LE ulcer Electronic Signature(s) Signed: 10/18/2022 8:33:55 AM By: Allen Derry PA-C Entered By: Allen Derry on 10/18/2022 08:33:54 -------------------------------------------------------------------------------- Debridement Details Patient Name: Date of Service: [name], [name] 10/18/2022 7:30 A M Medical Record Number: [id number] Patient Account Number: [id number] Date of Birth/Sex: Treating RN: 01-18-1951 (72 y.o. Female) Huel Coventry Primary Care Provider: Daniel Nones Other Clinician: Referring Provider: Treating Provider/Extender: Leane Call NT, HO BSO N Weeks in Treatment: 0 Debridement Performed for Assessment: Wound #1 Right,Midline Lower Leg Performed By: Physician Allen Derry, PA-C Debridement Type: Debridement Level of Consciousness (Pre-procedure): Awake and Alert Pre-procedure Verification/Time Out Yes - 08:40 Taken: Pain Control: Lidocaine Percent of Wound Bed Debrided: 50% T Area Debrided (cm): otal 12.36 Tissue and other material debrided: Viable, Non-Viable, Slough, Subcutaneous, Skin: Dermis , Skin: Epidermis, Slough Level: Skin/Subcutaneous Tissue Debridement Description: Excisional Instrument: Curette Bleeding: Minimum Hemostasis Achieved: Pressure Response to Treatment: Procedure was tolerated well Level of Consciousness (Post- Awake and Alert procedure): [name], [name] ([id number])  [id number] Page 2 of 10 Post Debridement Measurements of Total Wound Length: (cm) 7 Width: (cm) 4.5 Depth: (cm) 0.1 Volume: (cm) 2.474 Character of Wound/Ulcer Post Debridement: Improved Post Procedure Diagnosis Same as Pre-procedure Electronic Signature(s) Signed: 10/18/2022 12:16:56 PM By: Elliot Gurney, BSN, RN, CWS, Kim RN, BSN Signed: 10/18/2022 1:44:39 PM By: Allen Derry PA-C Entered By: Elliot Gurney, BSN, RN, CWS, Kim on 10/18/2022 08:41:56 -------------------------------------------------------------------------------- HPI Details Patient Name: Date of Service: [name], [name] 10/18/2022 7:30 A M Medical Record Number: [id number] Patient Account Number: [id number] Date of Birth/Sex: Treating RN: May 26, 1950 (72 y.o. Female) Huel Coventry Primary Care Provider: Daniel Nones Other Clinician: Referring Provider: Treating Provider/Extender: Leane Call NT, HO BSO N Weeks in Treatment: 0 History of Present Illness HPI Description: 10-18-2022 patient presents today for initial inspection here in our clinic concerning issues that she has been having with a wound on her right anterior lower extremity. Unfortunately the patient had an issue where she was setting up a table and the table was leg had gotten stuck. When she was trying to dislodge it came loose too quickly and she ended up actually falling and this caused a significant issue with a skin tear on the right shin among other things including hitting her head and hurting her neck as well. Fortunately I do not see any signs overall of infection right now but there is definitely some need for sharp debridement in regard to the necrotic tissue at this point on the right anterior shin. 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 claim. Patient has an elevated blood pressure reading today but no diagnosis of hypertension she has whitecoat syndrome otherwise she is a fairly healthy individual as far as  wound healing is concerned. Electronic Signature(s) Signed: 10/18/2022 9:21:05 AM By: Allen Derry PA-C Entered By: Allen Derry on 10/18/2022 09:21:05 -------------------------------------------------------------------------------- Physical Exam Details Patient Name: Date of Service: [name], [name] 10/18/2022 7:30 A M Medical Record Number: [id number] Patient Account Number: [id number] Date of Birth/Sex: Treating  RN: [birth year] (72 y.o. Female) Huel Coventry Primary Care Provider: Daniel Nones Other Clinician: Referring Provider: Treating Provider/Extender: Leane Call NT, HO BSO [name], [name] ([id number]) [id number] Page 3 of 10 Weeks in Treatment: 0 Constitutional patient is hypertensive.. pulse regular and within target range for patient.Marland Kitchen respirations regular, non-labored and within target range for patient.Marland Kitchen temperature within target range for patient.. Well-nourished and well-hydrated in no acute distress. Eyes conjunctiva clear no eyelid edema noted. pupils equal round and reactive to light and accommodation. Ears, Nose, Mouth, and Throat no gross abnormality of ear auricles or external auditory canals. normal hearing noted during conversation. mucus membranes moist. Respiratory normal breathing without difficulty. Cardiovascular 2+ dorsalis pedis/posterior tibialis pulses. no clubbing, cyanosis, significant edema, <3 sec cap refill. Musculoskeletal normal gait and posture. no significant deformity or arthritic changes, no loss or range of motion, no clubbing. Psychiatric this patient is able to make decisions and demonstrates good insight into disease process. Alert and Oriented x 3. pleasant and cooperative. Notes Upon inspection based on what I am seeing I do believe that the patient has some need for sharp debridement today. There is a lot of necrotic tissue that is not going to reattach some of it has reattached which is good news and  there is also an area right in the middle where I feel like this may actually be a bit deeper. Again I am going to perform debridement to clearway some of the necrotic debris she tolerated that today without complication and postdebridement wound bed actually appears significantly improved although there was a little fluid pocket right over top of the muscle in the central area. This does extend down to the muscle but does not involve the muscle as far as any necrosis was concerned. This was just a little seroma pocket it does not appear to be infected and once cleared away actually looked much better. I am hopeful we will be able to get this to heal much more effectively and quickly. With that being said I do believe that we are moving in the right direction here. Nonetheless I think is going to take a little bit of time to get this healed we will discuss the treatment options and the plan. Of note in regard to the patient's blood pressure this is elevated but she does have whitecoat syndrome she tells me that she checks at home pretty much anywhere else this is not the case. That is good news. She seems to have good arterial flow in her legs and I am very pleased in that regard. Electronic Signature(s) Signed: 10/18/2022 10:21:54 AM By: Allen Derry PA-C Entered By: Allen Derry on 10/18/2022 10:21:54 -------------------------------------------------------------------------------- Physician Orders Details Patient Name: Date of Service: [name], [name] 10/18/2022 7:30 A M Medical Record Number: [id number] Patient Account Number: [id number] Date of Birth/Sex: Treating RN: 09-23-1950 (72 y.o. Female) Huel Coventry Primary Care Provider: Daniel Nones Other Clinician: Referring Provider: Treating Provider/Extender: Leane Call NT, HO BSO N Weeks in Treatment: 0 Verbal / Phone Orders: No Diagnosis Coding ICD-10 Coding Code Description [id number] Laceration without foreign body, right lower leg,  initial encounter L97.812 Non-pressure chronic ulcer of other part of right lower leg with fat layer exposed R03.0 Elevated blood-pressure reading, without diagnosis of hypertension Follow-up Appointments Return Appointment in 1 week. Nurse Visit as needed [name], [name] ([id number]) [id number] Page 4 of 10 Bathing/ Shower/ Hygiene May shower with wound dressing protected with water repellent cover or cast protector. Anesthetic (Use '  Patient Medications' Section for Anesthetic Order Entry) Lidocaine applied to wound bed Additional Orders / Instructions Follow Nutritious Diet and Increase Protein Intake Wound Treatment Wound #1 - Lower Leg Wound Laterality: Right, Midline Cleanser: Vashe 5.8 (oz) 1 x Per Day/30 Days Discharge Instructions: Use vashe 5.8 (oz) as directed Prim Dressing: Prisma 4.34 (in) (DME) (Generic) 1 x Per Day/30 Days ary Discharge Instructions: Apply into center depth of wound Moisten w/normal saline or sterile water; Cover wound as directed. Do not remove from wound bed. Prim Dressing: Xeroform-HBD 2x2 (in/in) (DME) (Generic) 1 x Per Day/30 Days ary Discharge Instructions: Apply over entire wound as directed Secondary Dressing: (BORDER) Zetuvit Plus SILICONE BORDER Dressing 5x5 (in/in) (DME) (Generic) 1 x Per Day/30 Days Discharge Instructions: Please do not put silicone bordered dressings under wraps. Use non-bordered dressing only. Electronic Signature(s) Signed: 10/18/2022 12:16:56 PM By: Elliot Gurney, BSN, RN, CWS, Kim RN, BSN Signed: 10/18/2022 1:44:39 PM By: Allen Derry PA-C Entered By: Elliot Gurney BSN, RN, CWS, Kim on 10/18/2022 08:51:53 -------------------------------------------------------------------------------- Problem List Details Patient Name: Date of Service: [name], [name] 10/18/2022 7:30 A M Medical Record Number: [id number] Patient Account Number: [id number] Date of Birth/Sex: Treating RN: [birth year] (72 y.o. Female) Huel Coventry Primary Care Provider: Daniel Nones Other Clinician: Referring Provider: Treating Provider/Extender: Leane Call NT, HO BSO N Weeks in Treatment: 0 Active Problems ICD-10 Encounter Code Description Active Date MDM Diagnosis S81.811A Laceration without foreign body, right lower leg, initial encounter 10/18/2022 No Yes L97.812 Non-pressure chronic ulcer of other part of right lower leg with fat layer 10/18/2022 No Yes exposed R03.0 Elevated blood-pressure reading, without diagnosis of hypertension 10/18/2022 No Yes Inactive Problems Resolved Problems [name], [name] ([id number]) [id number] Page 5 of 10 Electronic Signature(s) Signed: 10/18/2022 8:33:40 AM By: Allen Derry PA-C Entered By: Allen Derry on 10/18/2022 08:33:40 -------------------------------------------------------------------------------- Progress Note Details Patient Name: Date of Service: [name], [place] [name] 10/18/2022 7:30 A M Medical Record Number: [id number] Patient Account Number: [id number] Date of Birth/Sex: Treating RN: 19-Mar-1951 (72 y.o. Female) Huel Coventry Primary Care Provider: Daniel Nones Other Clinician: Referring Provider: Treating Provider/Extender: Leane Call NT, HO BSO N Weeks in Treatment: 0 Subjective Chief Complaint Information obtained from Patient Right LE ulcer History of Present Illness (HPI) 10-18-2022 patient presents today for initial inspection here in our clinic concerning issues that she has been having with a wound on her right anterior lower extremity. Unfortunately the patient had an issue where she was setting up a table and the table was leg had gotten stuck. When she was trying to dislodge it came loose too quickly and she ended up actually falling and this caused a significant issue with a skin tear on the right shin among other things including hitting her head and hurting her neck as well. Fortunately I do not see any signs overall of  infection right now but there is definitely some need for sharp debridement in regard to the necrotic tissue at this point on the right anterior shin. 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 claim. Patient has an elevated blood pressure reading today but no diagnosis of hypertension she has whitecoat syndrome otherwise she is a fairly healthy individual as far as wound healing is concerned. Patient History Information obtained from Patient. Allergies iodine, Levaquin, bacitracin, Neosporin (neo-bac-polym) Social History Never smoker, Marital Status - Married, Alcohol Use - Rarely, Drug Use - No History, Caffeine Use - Daily. 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 Eyes Denies complaints or symptoms of Dry Eyes, Vision Changes, Glasses / Contacts. Ear/Nose/Mouth/Throat Denies complaints or symptoms of Difficult clearing ears, Sinusitis. Hematologic/Lymphatic Denies complaints or symptoms of Bleeding / Clotting Disorders, Human Immunodeficiency Virus. Respiratory Denies complaints or symptoms of Chronic or frequent coughs, Shortness of Breath. Cardiovascular Denies complaints or symptoms of Chest pain, LE edema. Gastrointestinal Denies complaints or symptoms of Frequent diarrhea, Nausea, Vomiting. Endocrine [name], [name] ([id number]) [id number] Page 6 of 10 Denies complaints or symptoms of Hepatitis, Thyroid disease, Polydypsia (Excessive Thirst). Genitourinary Denies complaints or symptoms of Kidney failure/ Dialysis,  Incontinence/dribbling. Immunological Denies complaints or symptoms of Hives, Itching. Integumentary (Skin) Complains or has symptoms of Wounds, Bleeding or bruising tendency. Musculoskeletal Complains or has symptoms of Muscle Pain - Back from fall. Neurologic Denies complaints or symptoms of Numbness/parasthesias, Focal/Weakness. Psychiatric Denies complaints or symptoms of Anxiety, Claustrophobia. Objective Constitutional patient is hypertensive.. pulse regular and within target range for patient.Marland Kitchen respirations regular, non-labored and within target range for patient.Marland Kitchen temperature within target range for patient.. Well-nourished and well-hydrated in no acute distress. Vitals Time Taken: 8:01 AM, Height: 63 in, Weight: 128 lbs, BMI: 22.7, Temperature: 98.1 F, Pulse: 76 bpm, Respiratory Rate: 16 breaths/min, Blood Pressure: 170/115 mmHg. General Notes: Patient state she has white coat syndrome. Eyes conjunctiva clear no eyelid edema noted. pupils equal round and reactive to light and accommodation. Ears, Nose, Mouth, and Throat no gross abnormality of ear auricles or external auditory canals. normal hearing noted during conversation. mucus membranes moist. Respiratory normal breathing without difficulty. Cardiovascular 2+ dorsalis pedis/posterior tibialis pulses. no clubbing, cyanosis, significant edema, Musculoskeletal normal gait and posture. no significant deformity or arthritic changes, no loss or range of motion, no clubbing. Psychiatric this patient is able to make decisions and demonstrates good insight into disease process. Alert and Oriented x 3. pleasant and cooperative. General Notes: Upon inspection based on what I am seeing I do believe that the patient has some need for sharp debridement today. There is a lot of necrotic tissue that is not going to reattach some of it has reattached which is good news and there is also an area right in the middle where I feel like this  may actually be a bit deeper. Again I am going to perform debridement to clearway some of the necrotic debris she tolerated that today without complication and postdebridement wound bed actually appears significantly improved although there was a little fluid pocket right over top of the muscle in the central area. This does extend down to the muscle but does not involve the muscle as far as any necrosis was concerned. This was just a little seroma pocket it does not appear to be infected and once cleared away actually looked much better. I am hopeful we will be able to get this to heal much more effectively and quickly. With that being said I do believe that we are moving in the right direction here. Nonetheless I think is going to take a little bit of time to get this healed we will discuss the treatment options and the plan. Of note in regard to the patient's blood pressure this is elevated but she does have whitecoat syndrome she tells me that she  checks at home pretty much anywhere else this is not the case. That is good news. She seems to have good arterial flow in her legs and I am very pleased in that regard. Integumentary (Hair, Skin) Wound #1 status is Open. Original cause of wound was Trauma. The date acquired was: 10/08/2022. The wound is located on the Right,Midline Lower Leg. The wound measures 7cm length x 4.5cm width x 0.3cm depth; 24.74cm^2 area and 7.422cm^3 volume. There is muscle and Fat Layer (Subcutaneous Tissue) exposed. There is undermining starting at 6:00 and ending at 9:00 with a maximum distance of 0.8cm. There is additional undermining and at 3:00 and ending at 6:00 with a maximum distance of 0.2cm. There is a medium amount of serous drainage noted. The wound margin is flat and intact. There is medium (34-66%) red granulation within the wound bed. There is a medium (34-66%) amount of necrotic tissue within the wound bed including Adherent Slough. General Notes: Undermining  and depth measured in center of wound. Assessment Active Problems ICD-10 Laceration without foreign body, right lower leg, initial encounter Non-pressure chronic ulcer of other part of right lower leg with fat layer exposed Elevated blood-pressure reading, without diagnosis of hypertension Procedures [name], [name] ([id number]) [id number] Page 7 of 10 Wound #1 Pre-procedure diagnosis of Wound #1 is a Trauma, Other located on the Right,Midline Lower Leg . There was a Excisional Skin/Subcutaneous Tissue Debridement with a total area of 12.36 sq cm performed by Allen Derry, PA-C. With the following instrument(s): Curette to remove Viable and Non-Viable tissue/material. Material removed includes Subcutaneous Tissue, Slough, Skin: Dermis, and Skin: Epidermis after achieving pain control using Lidocaine. No specimens were taken. A time out was conducted at 08:40, prior to the start of the procedure. A Minimum amount of bleeding was controlled with Pressure. The procedure was tolerated well. Post Debridement Measurements: 7cm length x 4.5cm width x 0.1cm depth; 2.474cm^3 volume. Character of Wound/Ulcer Post Debridement is improved. Post procedure Diagnosis Wound #1: Same as Pre-Procedure Plan Follow-up Appointments: Return Appointment in 1 week. Nurse Visit as needed Bathing/ Shower/ Hygiene: May shower with wound dressing protected with water repellent cover or cast protector. Anesthetic (Use 'Patient Medications' Section for Anesthetic Order Entry): Lidocaine applied to wound bed Additional Orders / Instructions: Follow Nutritious Diet and Increase Protein Intake WOUND #1: - Lower Leg Wound Laterality: Right, Midline Cleanser: Vashe 5.8 (oz) 1 x Per Day/30 Days Discharge Instructions: Use vashe 5.8 (oz) as directed Prim Dressing: Prisma 4.34 (in) (DME) (Generic) 1 x Per Day/30 Days ary Discharge Instructions: Apply into center depth of wound Moisten w/normal  saline or sterile water; Cover wound as directed. Do not remove from wound bed. Prim Dressing: Xeroform-HBD 2x2 (in/in) (DME) (Generic) 1 x Per Day/30 Days ary Discharge Instructions: Apply over entire wound as directed Secondary Dressing: (BORDER) Zetuvit Plus SILICONE BORDER Dressing 5x5 (in/in) (DME) (Generic) 1 x Per Day/30 Days Discharge Instructions: Please do not put silicone bordered dressings under wraps. Use non-bordered dressing only. 1. Based on what I am seeing I do believe that the patient would benefit currently from after this debridement initiate treatment with a Xeroform gauze dressing. I am also can recommend based on what we are seeing that we use a little bit of collagen over the deeper area over the muscles exposed in the center part of the wound. She is in agreement with the plan and again this is something that she tolerated quite well today all things considered. She did have some discomfort  but again I think a lot of this is frustration as well she is very frustrated with the situation in general which I completely understand especially in light of how unexpected this was. 2. I am good recommend that we continue with the dressing changes every 2 days. This should be well and will prevent this from sticking if it stays longer that would be my biggest concern. 3. I am also can recommend that the patient should continue to monitor for any evidence of infection or worsening. Obviously if anything changes she knows to let me know such as increased drainage, purulent drainage, fever, chills, nausea, vomiting, or diarrhea. We will see patient back for reevaluation in 1 week here in the clinic. If anything worsens or changes patient will contact our office for additional recommendations. Electronic Signature(s) Signed: 10/18/2022 10:23:12 AM By: Allen Derry PA-C Entered By: Allen Derry on 10/18/2022  10:23:12 -------------------------------------------------------------------------------- ROS/PFSH Details Patient Name: Date of Service: [name], [name] 10/18/2022 7:30 A M Medical Record Number: [id number] Patient Account Number: [id number] Date of Birth/Sex: Treating RN: 1950-08-29 (72 y.o. Female) Huel Coventry Primary Care Provider: Daniel Nones Other Clinician: Referring Provider: Treating Provider/Extender: Leane Call NT, HO BSO N Weeks in Treatment: 0 Information Obtained From Patient [name], [name] ([id number]) [id number] Page 8 of 10 Constitutional Symptoms (General Health) Complaints and Symptoms: Negative for: Fatigue; Fever; Chills; Marked Weight Change Eyes Complaints and Symptoms: Negative for: Dry Eyes; Vision Changes; Glasses / Contacts Ear/Nose/Mouth/Throat Complaints and Symptoms: Negative for: Difficult clearing ears; Sinusitis Medical History: Positive for: Chronic sinus problems/congestion Hematologic/Lymphatic Complaints and Symptoms: Negative for: Bleeding / Clotting Disorders; Human Immunodeficiency Virus Respiratory Complaints and Symptoms: Negative for: Chronic or frequent coughs; Shortness of Breath Cardiovascular Complaints and Symptoms: Negative for: Chest pain; LE edema Medical History: Negative for: Hypertension Gastrointestinal Complaints and Symptoms: Negative for: Frequent diarrhea; Nausea; Vomiting Endocrine Complaints and Symptoms: Negative for: Hepatitis; Thyroid disease; Polydypsia (Excessive Thirst) Genitourinary Complaints and Symptoms: Negative for: Kidney failure/ Dialysis; Incontinence/dribbling Immunological Complaints and Symptoms: Negative for: Hives; Itching Integumentary (Skin) Complaints and Symptoms: Positive for: Wounds; Bleeding or bruising tendency Musculoskeletal Complaints and Symptoms: Positive for: Muscle Pain - Back from fall Neurologic Complaints and Symptoms: Negative  for: Numbness/parasthesias; Focal/Weakness Psychiatric Complaints and Symptoms: Negative for: Anxiety; Claustrophobia Oncologic Medical History: Negative for: Received Chemotherapy; Received Radiation [name], [name] ([id number]) [id number] Page 9 of 10 Past Medical History Notes: Squamous cell carcinoma of the face HBO Extended History Items Ear/Nose/Mouth/Throat: Chronic sinus problems/congestion Immunizations Pneumococcal Vaccine: Received Pneumococcal Vaccination: No Implantable Devices None Family and Social History Never smoker; Marital Status - Married; Alcohol Use: Rarely; Drug Use: No History; Caffeine Use: Daily Electronic Signature(s) Signed: 10/18/2022 12:16:56 PM By: Elliot Gurney, BSN, RN, CWS, Kim RN, BSN Signed: 10/18/2022 1:44:39 PM By: Allen Derry PA-C Entered By: Elliot Gurney BSN, RN, CWS, Kim on 10/18/2022 08:07:37 -------------------------------------------------------------------------------- SuperBill Details Patient Name: Date of Service: [name], [name] 10/18/2022 Medical Record Number: [id number] Patient Account Number: [id number] Date of Birth/Sex: Treating RN: 07/27/1950 (72 y.o. Female) Huel Coventry Primary Care Provider: Daniel Nones Other Clinician: Referring Provider: Treating Provider/Extender: Leane Call NT, HO BSO N Weeks in Treatment: 0 Diagnosis Coding ICD-10 Codes Code Description [phone] Laceration without foreign body, right lower leg, initial encounter L97.812 Non-pressure chronic ulcer of other part of right lower leg with fat layer exposed R03.0 Elevated blood-pressure reading, without diagnosis of hypertension Facility Procedures : CPT4 Code: 13244010 Description: 99213 - WOUND CARE VISIT-LEV 3 EST PT  Modifier: Quantity: 1 : CPT4 Code: 19147829 Description: 11042 - DEB SUBQ TISSUE 20 SQ CM/< ICD-10 Diagnosis Description L97.812 Non-pressure chronic ulcer of other part of right lower leg with fat layer  expos Modifier: ed Quantity: 1 Physician Procedures : CPT4 Code Description Modifier [id number] WC PHYS LEVEL 3 NEW PT 25 ICD-10 Diagnosis Description S81.811A Laceration without foreign body, right lower leg, initial encounter L97.812 Non-pressure chronic ulcer of other part of right lower leg with fat  layer exposed R03.0 Elevated blood-pressure reading, without diagnosis of hypertension AREYONA, SCHALLERT J ([id number]) [id number] 11042 - WC PHYS SUBQ TISS 20 SQ CM 1 ICD-10 Diagnosis Description L97.812 Non-pressure chronic  ulcer of other part of right lower leg with fat layer exposed Quantity: 1 7.pdf Page 10 of 10 Electronic Signature(s) Signed: 10/18/2022 10:23:24 AM By: Allen Derry PA-C Entered By: Allen Derry on 10/18/2022 24:40:10

## 2022-10-23 ENCOUNTER — Other Ambulatory Visit: Payer: Self-pay | Admitting: Physician Assistant

## 2022-10-23 ENCOUNTER — Encounter: Payer: Self-pay | Admitting: Internal Medicine

## 2022-10-23 ENCOUNTER — Ambulatory Visit
Admission: RE | Admit: 2022-10-23 | Discharge: 2022-10-23 | Disposition: A | Discharge status: Home / Self Care | Payer: Worker's Compensation | Source: Ambulatory Visit | Attending: Adult Health Nurse Practitioner | Admitting: Adult Health Nurse Practitioner

## 2022-10-23 ENCOUNTER — Other Ambulatory Visit: Payer: Self-pay

## 2022-10-23 ENCOUNTER — Other Ambulatory Visit: Payer: Self-pay | Admitting: Adult Health Nurse Practitioner

## 2022-10-23 ENCOUNTER — Ambulatory Visit
Admission: RE | Admit: 2022-10-23 | Discharge: 2022-10-23 | Disposition: A | Discharge status: Home / Self Care | Payer: Worker's Compensation | Source: Ambulatory Visit | Attending: Physician Assistant | Admitting: Physician Assistant

## 2022-10-23 ENCOUNTER — Encounter (HOSPITAL_BASED_OUTPATIENT_CLINIC_OR_DEPARTMENT_OTHER): Payer: PRIVATE HEALTH INSURANCE | Admitting: Internal Medicine

## 2022-10-23 DIAGNOSIS — L97812 Non-pressure chronic ulcer of other part of right lower leg with fat layer exposed: Secondary | ICD-10-CM | POA: Diagnosis not present

## 2022-10-23 DIAGNOSIS — R030 Elevated blood-pressure reading, without diagnosis of hypertension: Secondary | ICD-10-CM

## 2022-10-23 DIAGNOSIS — T148XXA Other injury of unspecified body region, initial encounter: Secondary | ICD-10-CM

## 2022-10-23 DIAGNOSIS — S81811A Laceration without foreign body, right lower leg, initial encounter: Secondary | ICD-10-CM

## 2022-10-23 MED ORDER — CLINDAMYCIN HCL 300 MG PO CAPS
300.0000 mg | ORAL_CAPSULE | Freq: Four times a day (QID) | ORAL | 0 refills | Status: DC
  Filled 2022-10-23: qty 28, 7d supply, fill #0

## 2022-10-23 MED ORDER — MUPIROCIN 2 % EX OINT
1.0000 | TOPICAL_OINTMENT | Freq: Every day | CUTANEOUS | 0 refills | Status: DC
  Filled 2022-10-23: qty 22, 30d supply, fill #0

## 2022-10-25 ENCOUNTER — Encounter: Payer: PRIVATE HEALTH INSURANCE | Admitting: Physician Assistant

## 2022-10-25 DIAGNOSIS — S81811A Laceration without foreign body, right lower leg, initial encounter: Secondary | ICD-10-CM | POA: Diagnosis not present

## 2022-10-25 NOTE — Progress Notes (Addendum)
Meredith, Prince ([id number]) [id number] Page 1 of 6 Visit Report for 10/25/2022 Chief Complaint Document Details Patient Name: Date of Service: [name], [place] [name] 10/25/2022 9:30 A M Medical Record Number: [id number] Patient Account Number: [id number] Date of Birth/Sex: Treating RN: 1950-07-27 (72 y.o. [name] Primary Care Provider: Daniel Nones Other Clinician: Betha Loa Referring Provider: Treating Provider/Extender: Lois Huxley in Treatment: 1 Information Obtained from: Patient Chief Complaint Right LE ulcer Electronic Signature(s) Signed: 10/25/2022 9:30:59 AM By: Allen Derry PA-C Entered By: Allen Derry on 10/25/2022 09:30:59 -------------------------------------------------------------------------------- HPI Details Patient Name: Date of Service: [name], [name] 10/25/2022 9:30 A M Medical Record Number: [id number] Patient Account Number: [id number] Date of Birth/Sex: Treating RN: Mar 13, 1951 (72 y.o. [name] Primary Care Provider: Daniel Nones Other Clinician: Betha Loa Referring Provider: Treating Provider/Extender: Lois Huxley in Treatment: 1 History of Present Illness HPI Description: 10-18-2022 patient presents today for initial inspection here in our clinic concerning issues that she has been having with a wound on her right anterior lower extremity. Unfortunately the patient had an issue where she was setting up a table and the table was leg had gotten stuck. When she was trying to dislodge it came loose too quickly and she ended up actually falling and this caused a significant issue with a skin tear on the right shin among other things including hitting her head and hurting her neck as well. Fortunately I do not see any signs overall of infection right now but there is definitely some need for sharp debridement in regard to the necrotic tissue at this point on the right anterior  shin. 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 claim. Patient has an elevated blood pressure reading today but no diagnosis of hypertension she has whitecoat syndrome otherwise she is a fairly healthy individual as far as wound healing is concerned. 6/19; patient was admitted to our clinic last week on 6/14 for traumatic leg wound. Upon having her dressing changed today it was noted she was having more serous drainage from the Opening in the center Of the wound and patient was concerned for infection and came in to be evaluated. Currently she denies fever/chills, increased warmth or erythema to the surrounding skin or Purulent drainage. She has been using collagen and Xeroform to the wound beds. 10-25-2022 upon evaluation today patient's wound actually appears to be doing much better at this time. Fortunately I do not see any signs of active infection locally or systemically which is good news that she actually seems to be better than apparently she was on Wednesday. Nonetheless she still has the center area that keeps collecting fluid and sealing up and then subsequently draining I think if we keep her swelling under control this will alleviate a lot of this issue in general. I discussed that with her today. HAGAN, MALTZ ([id number]) [id number] Page 2 of 6 Electronic Signature(s) Signed: 10/25/2022 1:35:39 PM By: Allen Derry PA-C Entered By: Allen Derry on 10/25/2022 13:35:38 -------------------------------------------------------------------------------- Physical Exam Details Patient Name: Date of Service: [name], [place] [name] 10/25/2022 9:30 A M Medical Record Number: [id number] Patient Account Number: [id number] Date of Birth/Sex: Treating RN: 12-07-1950 (72 y.o. [name] Primary Care Provider: Daniel Nones Other Clinician: Betha Loa Referring Provider: Treating Provider/Extender: [name] in Treatment:  1 Constitutional Well-nourished and well-hydrated in no acute distress. Respiratory normal breathing without difficulty. Psychiatric this patient is able  to make decisions and demonstrates good insight into disease process. Alert and Oriented x 3. pleasant and cooperative. Notes Upon inspection patient's wound bed actually showed signs of good granulation epithelization at this point. Fortunately I do not see any evidence of again worsening infection she is on the antibiotic, clindamycin, as prescribed by Dr. Mikey Bussing and seems to be doing much better in that regard and very pleased as far as that is concerned. In general I think that we are making good progress. Electronic Signature(s) Signed: 10/25/2022 1:36:03 PM By: Allen Derry PA-C Entered By: Allen Derry on 10/25/2022 13:36:03 -------------------------------------------------------------------------------- Physician Orders Details Patient Name: Date of Service: [name], [name] 10/25/2022 9:30 A M Medical Record Number: [id number] Patient Account Number: [id number] Date of Birth/Sex: Treating RN: 1950-10-25 (72 y.o. [name] Primary Care Provider: Daniel Nones Other Clinician: Betha Loa Referring Provider: Treating Provider/Extender: Lois Huxley in Treatment: 1 Verbal / Phone Orders: Yes Clinician: Angelina Pih Read Back and Verified: Yes Diagnosis Coding ICD-10 Coding Code Description Prince, Meredith ([id number]) [phone].pdf Page 3 of 6 [phone] Laceration without foreign body, right lower leg, initial encounter L97.812 Non-pressure chronic ulcer of other part of right lower leg with fat layer exposed R03.0 Elevated blood-pressure reading, without diagnosis of hypertension Follow-up Appointments Return Appointment in 1 week. Nurse Visit as needed Bathing/ Shower/ Hygiene May shower with wound dressing protected with water repellent cover or cast  protector. Anesthetic (Use 'Patient Medications' Section for Anesthetic Order Entry) Lidocaine applied to wound bed Additional Orders / Instructions Follow Nutritious Diet and Increase Protein Intake Medications-Please add to medication list. P.O. Antibiotics Topical A ntibiotic Wound Treatment Wound #1 - Lower Leg Wound Laterality: Right, Midline Cleanser: Vashe 5.8 (oz) 1 x Per Day/30 Days Discharge Instructions: Use vashe 5.8 (oz) as directed Topical: Mupirocin Ointment 1 x Per Day/30 Days Discharge Instructions: Apply as directed by provider. Prim Dressing: Hydrofera Blue Ready Transfer Foam, 4x5 (in/in) 1 x Per Day/30 Days ary Discharge Instructions: Apply Hydrofera Blue Ready to wound bed as directed Prim Dressing: (Border) Zetuvit Plus Silicone Border Dressing 5x5 (in/in) (Generic) 1 x Per Day/30 Days ary Secured With: Tubigrip Size C, 2.75x10 (in/yd) 1 x Per Day/30 Days Discharge Instructions: Apply 3 Tubigrip C 3-finger-widths below knee to base of toes to secure dressing and/or for swelling. Electronic Signature(s) Signed: 10/25/2022 1:13:15 PM By: Betha Loa Signed: 10/25/2022 1:44:40 PM By: Allen Derry PA-C Entered By: Betha Loa on 10/25/2022 10:22:11 -------------------------------------------------------------------------------- Problem List Details Patient Name: Date of Service: [name], [name] 10/25/2022 9:30 A M Medical Record Number: [id number] Patient Account Number: [id number] Date of Birth/Sex: Treating RN: 07/18/1950 (72 y.o. [name] Primary Care Provider: Daniel Nones Other Clinician: Betha Loa Referring Provider: Treating Provider/Extender: Lois Huxley in Treatment: 1 Active Problems ICD-10 Encounter Code Description Active Date MDM Diagnosis S81.811A Laceration without foreign body, right lower leg, initial encounter 10/18/2022 No Yes DEZIRE, TURK ([id number]) [phone].pdf Page 4  of 6 [phone] Non-pressure chronic ulcer of other part of right lower leg with fat layer 10/18/2022 No Yes exposed R03.0 Elevated blood-pressure reading, without diagnosis of hypertension 10/18/2022 No Yes Inactive Problems Resolved Problems Electronic Signature(s) Signed: 10/25/2022 9:30:53 AM By: Allen Derry PA-C Entered By: Allen Derry on 10/25/2022 09:30:53 -------------------------------------------------------------------------------- Progress Note Details Patient Name: Date of Service: [name], [name] 10/25/2022 9:30 A M Medical Record Number: [id number] Patient Account Number: [id number] Date of Birth/Sex: Treating RN: 10-10-1950 (71 y.o.  [name] Primary Care Provider: Daniel Nones Other Clinician: Betha Loa Referring Provider: Treating Provider/Extender: Lois Huxley in Treatment: 1 Subjective Chief Complaint Information obtained from Patient Right LE ulcer History of Present Illness (HPI) 10-18-2022 patient presents today for initial inspection here in our clinic concerning issues that she has been having with a wound on her right anterior lower extremity. Unfortunately the patient had an issue where she was setting up a table and the table was leg had gotten stuck. When she was trying to dislodge it came loose too quickly and she ended up actually falling and this caused a significant issue with a skin tear on the right shin among other things including hitting her head and hurting her neck as well. Fortunately I do not see any signs overall of infection right now but there is definitely some need for sharp debridement in regard to the necrotic tissue at this point on the right anterior shin. 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 claim. Patient has an elevated blood pressure reading today but no diagnosis of hypertension she has whitecoat syndrome otherwise she is a fairly healthy individual as far as wound healing is  concerned. 6/19; patient was admitted to our clinic last week on 6/14 for traumatic leg wound. Upon having her dressing changed today it was noted she was having more serous drainage from the Opening in the center Of the wound and patient was concerned for infection and came in to be evaluated. Currently she denies fever/chills, increased warmth or erythema to the surrounding skin or Purulent drainage. She has been using collagen and Xeroform to the wound beds. 10-25-2022 upon evaluation today patient's wound actually appears to be doing much better at this time. Fortunately I do not see any signs of active infection locally or systemically which is good news that she actually seems to be better than apparently she was on Wednesday. Nonetheless she still has the center area that keeps collecting fluid and sealing up and then subsequently draining I think if we keep her swelling under control this will alleviate a lot of this issue in general. I discussed that with her today. Objective Constitutional Well-nourished and well-hydrated in no acute distress. SHALLON, YAKLIN ([id number]) [id number] Page 5 of 6 Vitals Time Taken: 9:54 AM, Height: 63 in, Weight: 128 lbs, BMI: 22.7, Temperature: 98.1 F, Pulse: 97 bpm, Respiratory Rate: 16 breaths/min, Blood Pressure: 169/117 mmHg. Respiratory normal breathing without difficulty. Psychiatric this patient is able to make decisions and demonstrates good insight into disease process. Alert and Oriented x 3. pleasant and cooperative. General Notes: Upon inspection patient's wound bed actually showed signs of good granulation epithelization at this point. Fortunately I do not see any evidence of again worsening infection she is on the antibiotic, clindamycin, as prescribed by Dr. Mikey Bussing and seems to be doing much better in that regard and very pleased as far as that is concerned. In general I think that we are making good  progress. Integumentary (Hair, Skin) Wound #1 status is Open. Original cause of wound was Trauma. The date acquired was: 10/08/2022. The wound has been in treatment 1 weeks. The wound is located on the Right,Midline Lower Leg. The wound measures 7.3cm length x 3.4cm width x 0.2cm depth; 19.494cm^2 area and 3.899cm^3 volume. There is muscle and Fat Layer (Subcutaneous Tissue) exposed. There is a large amount of serous drainage noted. The wound margin is flat and intact. There is large (67-100%) red  granulation within the wound bed. There is a small (1-33%) amount of necrotic tissue within the wound bed including Adherent Slough. Assessment Active Problems ICD-10 Laceration without foreign body, right lower leg, initial encounter Non-pressure chronic ulcer of other part of right lower leg with fat layer exposed Elevated blood-pressure reading, without diagnosis of hypertension Plan Follow-up Appointments: Return Appointment in 1 week. Nurse Visit as needed Bathing/ Shower/ Hygiene: May shower with wound dressing protected with water repellent cover or cast protector. Anesthetic (Use 'Patient Medications' Section for Anesthetic Order Entry): Lidocaine applied to wound bed Additional Orders / Instructions: Follow Nutritious Diet and Increase Protein Intake Medications-Please add to medication list.: P.O. Antibiotics Topical Antibiotic WOUND #1: - Lower Leg Wound Laterality: Right, Midline Cleanser: Vashe 5.8 (oz) 1 x Per Day/30 Days Discharge Instructions: Use vashe 5.8 (oz) as directed Topical: Mupirocin Ointment 1 x Per Day/30 Days Discharge Instructions: Apply as directed by provider. Prim Dressing: Hydrofera Blue Ready Transfer Foam, 4x5 (in/in) 1 x Per Day/30 Days ary Discharge Instructions: Apply Hydrofera Blue Ready to wound bed as directed Prim Dressing: (Border) Zetuvit Plus Silicone Border Dressing 5x5 (in/in) (Generic) 1 x Per Day/30 Days ary Secured With: Tubigrip Size C,  2.75x10 (in/yd) 1 x Per Day/30 Days Discharge Instructions: Apply 3 Tubigrip C 3-finger-widths below knee to base of toes to secure dressing and/or for swelling. 1. I am going to suggest that the patient should continue to monitor for any evidence of infection or worsening in general and I suggest that we use the mupirocin ointment followed by the [facility] which I think would be a good option. 2 also can recommend that we use the Zetuvit to cover and then subsequently we will secure this with the roll gauze and then Tubigrip size C to help with edema hopefully get some of the swelling down this will be much better. We will see patient back for reevaluation in 1 week here in the clinic. If anything worsens or changes patient will contact our office for additional recommendations. Electronic Signature(s) Signed: 10/25/2022 1:36:38 PM By: Allen Derry PA-C Entered By: Allen Derry on 10/25/2022 13:36:37 Vevay Nation ([id number]) [id number] Page 6 of 6 -------------------------------------------------------------------------------- SuperBill Details Patient Name: Date of Service: [name], [place] [name] 10/25/2022 Medical Record Number: [id number] Patient Account Number: [id number] Date of Birth/Sex: Treating RN: 1951-02-16 (72 y.o. [name] Riser, Luther Parody Primary Care Provider: Daniel Nones Other Clinician: Betha Loa Referring Provider: Treating Provider/Extender: Lois Huxley in Treatment: 1 Diagnosis Coding ICD-10 Codes Code Description [phone] Laceration without foreign body, right lower leg, initial encounter L97.812 Non-pressure chronic ulcer of other part of right lower leg with fat layer exposed R03.0 Elevated blood-pressure reading, without diagnosis of hypertension Facility Procedures : CPT4 Code: [id number] Description: 99213 - WOUND CARE VISIT-LEV 3 EST PT Modifier: Quantity: 1 Physician Procedures : CPT4 Code Description Modifier  [id number] 99213 - WC PHYS LEVEL 3 - EST PT ICD-10 Diagnosis Description S81.811A Laceration without foreign body, right lower leg, initial encounter L97.812 Non-pressure chronic ulcer of other part of right lower leg with  fat layer exposed R03.0 Elevated blood-pressure reading, without diagnosis of hypertension Quantity: 1 Electronic Signature(s) Signed: 10/25/2022 1:36:59 PM By: Allen Derry PA-C Previous Signature: 10/25/2022 1:13:15 PM Version By: Betha Loa Entered By: Allen Derry on 10/25/2022 13:36:58

## 2022-10-25 NOTE — Progress Notes (Addendum)
Meredith, Prince ([id number]) [id number] Page 1 of 10 Visit Report for 10/25/2022 Arrival Information Details Patient Name: Date of Service: [name], [place] [name] 10/25/2022 9:30 A M Medical Record Number: [id number] Patient Account Number: [id number] Date of Birth/Sex: Treating RN: [birth year] (72 y.o. [name] Primary Care [name]: [name] Other Clinician: Betha Prince Referring [name]: Treating [name] in Treatment: 1 Visit Information History Since Last Visit All ordered tests and consults were completed: No Patient Arrived: Ambulatory Added or deleted any medications: No Arrival Time: 09:44 Any new allergies or adverse reactions: No Transfer Assistance: None Had a fall or experienced change in No Patient Identification Verified: Yes activities of daily living that may affect Secondary Verification Process Completed: Yes risk of falls: Patient Requires Transmission-Based No Signs or symptoms of abuse/neglect since last visito No Precautions: Hospitalized since last visit: No Patient Has Alerts: Yes Implantable device outside of the clinic excluding No Patient Alerts: 6/14Non-compressible cellular tissue based products placed in the center >220 since last visit: Has Dressing in Place as Prescribed: Yes Pain Present Now: Yes Electronic Signature(s) Signed: 10/25/2022 1:13:15 PM By: [name] Entered By: [name] on 10/25/2022 09:53:53 -------------------------------------------------------------------------------- Clinic Level of Care Assessment Details Patient Name: Date of ServiceNancie Prince [place] J. 10/25/2022 9:30 A M Medical Record Number: [id number] Patient Account Number: [id number] Date of Birth/Sex: Treating RN: Sep 14, 1950 (72 y.o. [name] Primary Care [name]: [name] Other Clinician: Betha Prince Referring [name]: Treating [name]/Extender: [name] in Treatment: 1 Clinic Level of Care Assessment Items TOOL 4 Quantity Score []  - 0 Use when only an EandM is performed on FOLLOW-UP visit ASSESSMENTS - Nursing Assessment / Reassessment X- 1 10 Reassessment of Co-morbidities (includes updates in patient status) X- 1 5 Reassessment of Adherence to Treatment Plan ZEPHYR, SAUSEDO ([id number]) [phone].pdf Page 2 of 10 ASSESSMENTS - Wound and Skin A ssessment / Reassessment X - Simple Wound Assessment / Reassessment - one wound 1 5 []  - 0 Complex Wound Assessment / Reassessment - multiple wounds []  - 0 Dermatologic / Skin Assessment (not related to wound area) ASSESSMENTS - Focused Assessment []  - 0 Circumferential Edema Measurements - multi extremities []  - 0 Nutritional Assessment / Counseling / Intervention []  - 0 Lower Extremity Assessment (monofilament, tuning fork, pulses) []  - 0 Peripheral Arterial Disease Assessment (using hand held doppler) ASSESSMENTS - Ostomy and/or Continence Assessment and Care []  - 0 Incontinence Assessment and Management []  - 0 Ostomy Care Assessment and Management (repouching, etc.) PROCESS - Coordination of Care X - Simple Patient / Family Education for ongoing care 1 15 []  - 0 Complex (extensive) Patient / Family Education for ongoing care []  - 0 Staff obtains Chiropractor, Records, T Results / Process Orders est []  - 0 Staff telephones HHA, Nursing Homes / Clarify orders / etc []  - 0 Routine Transfer to another Facility (non-emergent condition) []  - 0 Routine Hospital Admission (non-emergent condition) []  - 0 New Admissions / Manufacturing engineer / Ordering NPWT Apligraf, etc. , []  - 0 Emergency Hospital Admission (emergent condition) X- 1 10 Simple Discharge Coordination []  - 0 Complex (extensive) Discharge Coordination PROCESS - Special Needs []  - 0 Pediatric / Minor Patient Management []  - 0 Isolation Patient Management []  -  0 Hearing / Language / Visual special needs []  - 0 Assessment of Community assistance (transportation, D/C planning, etc.) []  - 0 Additional assistance / Altered mentation []  - 0 Support Surface(s) Assessment (  bed, cushion, seat, etc.) INTERVENTIONS - Wound Cleansing / Measurement X - Simple Wound Cleansing - one wound 1 5 []  - 0 Complex Wound Cleansing - multiple wounds X- 1 5 Wound Imaging (photographs - any number of wounds) []  - 0 Wound Tracing (instead of photographs) X- 1 5 Simple Wound Measurement - one wound []  - 0 Complex Wound Measurement - multiple wounds INTERVENTIONS - Wound Dressings X - Small Wound Dressing one or multiple wounds 1 10 []  - 0 Medium Wound Dressing one or multiple wounds []  - 0 Large Wound Dressing one or multiple wounds X- 1 5 Application of Medications - topical []  - 0 Application of Medications - injection INTERVENTIONS - Miscellaneous []  - 0 External ear exam Meredith, Prince ([id number]) [id number] Page 3 of 10 []  - 0 Specimen Collection (cultures, biopsies, blood, body fluids, etc.) []  - 0 Specimen(s) / Culture(s) sent or taken to Lab for analysis []  - 0 Patient Transfer (multiple staff / Michiel Sites Lift / Similar devices) []  - 0 Simple Staple / Suture removal (25 or less) []  - 0 Complex Staple / Suture removal (26 or more) []  - 0 Hypo / Hyperglycemic Management (close monitor of Blood Glucose) []  - 0 Ankle / Brachial Index (ABI) - do not check if billed separately X- 1 5 Vital Signs Has the patient been seen at the hospital within the last three years: Yes Total Score: 80 Level Of Care: New/Established - Level 3 Electronic Signature(s) Signed: 10/25/2022 1:13:15 PM By: [name] Entered By: [name] on 10/25/2022 10:21:03 -------------------------------------------------------------------------------- Encounter Discharge Information Details Patient Name: Date of Service: [name], Meredith [name]  10/25/2022 9:30 A M Medical Record Number: [id number] Patient Account Number: [id number] Date of Birth/Sex: Treating RN: 03-02-1951 (72 y.o. [name] Primary Care [name]: [name] Other Clinician: Betha Prince Referring [name]: Treating [name]/Extender: [name] in Treatment: 1 Encounter Discharge Information Items Discharge Condition: Stable Ambulatory Status: Ambulatory Discharge Destination: Home Transportation: Private Auto Accompanied By: self Schedule Follow-up Appointment: Yes Clinical Summary of Care: Electronic Signature(s) Signed: 10/25/2022 1:13:15 PM By: [name] Entered By: [name] on 10/25/2022 10:47:29 Lower Extremity Assessment Details -------------------------------------------------------------------------------- [name] ([id number]) [id number] Page 4 of 10 Patient Name: Date of Service: [name], [place] [name] 10/25/2022 9:30 A M Medical Record Number: [id number] Patient Account Number: [id number] Date of Birth/Sex: Treating RN: 11-03-1950 (72 y.o. [name] Primary Care Nghia Mcentee: [name] Other Clinician: Betha Prince Referring Tarrell Debes: Treating Prospero Mahnke/Extender: [name] in Treatment: 1 Edema Assessment Left: Right: Assessed: No Yes Edema: Calf Left: Right: Point of Measurement: 31 cm From Medial Instep 32.3 cm Ankle Left: Right: Point of Measurement: 8 cm From Medial Instep 20 cm Knee To Floor Left: Right: From Medial Instep 39 cm Vascular Assessment Left: Right: Pulses: Dorsalis Pedis Palpable: Yes Electronic Signature(s) Signed: 10/25/2022 12:55:20 PM By: Angelina Pih Signed: 10/25/2022 1:13:15 PM By: [name] Entered By: [name] on 10/25/2022 10:07:40 -------------------------------------------------------------------------------- Multi Wound Chart Details Patient Name: Date of Service: [name], Meredith [name]  10/25/2022 9:30 A M Medical Record Number: [id number] Patient Account Number: [id number] Date of Birth/Sex: Treating RN: [birth year] (72 y.o. [name] Primary Care Ambri Miltner: [name] Other Clinician: Betha Prince Referring Ariyana Faw: Treating Shatera Rennert/Extender: [name] in Treatment: 1 Vital Signs Height(in): 63 Pulse(bpm): 97 Weight(lbs): 128 Blood Pressure(mmHg): 169/117 Body Mass Index(BMI): 22.7 Temperature(F): 98.1 Respiratory Rate(breaths/min): 16 [1:Photos:] [N/A:N/A] Right, Midline Lower Leg N/A  N/A Wound Location: Trauma N/A N/A Wounding Event: Trauma, Other N/A N/A Primary Etiology: Chronic sinus problems/congestion N/A N/A Comorbid History: 10/08/2022 N/A N/A Date Acquired: 1 N/A N/A Weeks of Treatment: Open N/A N/A Wound Status: No N/A N/A Wound Recurrence: Yes N/A N/A Clustered Wound: 3 N/A N/A Clustered Quantity: 7.3x3.4x0.2 N/A N/A Measurements L x W x D (cm) 19.494 N/A N/A A (cm) : rea 3.899 N/A N/A Volume (cm) : 21.20% N/A N/A % Reduction in A rea: 47.50% N/A N/A % Reduction in Volume: Full Thickness With Exposed Support N/A N/A Classification: Structures Large N/A N/A Exudate Amount: Serous N/A N/A Exudate Type: amber N/A N/A Exudate Color: Flat and Intact N/A N/A Wound Margin: Large (67-100%) N/A N/A Granulation Amount: Red N/A N/A Granulation Quality: Small (1-33%) N/A N/A Necrotic Amount: Fat Layer (Subcutaneous Tissue): Yes N/A N/A Exposed Structures: Muscle: Yes Fascia: No Tendon: No Joint: No Bone: No Small (1-33%) N/A N/A Epithelialization: Treatment Notes Electronic Signature(s) Signed: 10/25/2022 1:13:15 PM By: [name] Entered By: [name] on 10/25/2022 10:07:52 -------------------------------------------------------------------------------- Multi-Disciplinary Care Plan Details Patient Name: Date of Service: [name], Meredith [name] 10/25/2022 9:30 A M Medical Record  Number: [id number] Patient Account Number: [id number] Date of Birth/Sex: Treating RN: 12-07-1950 (72 y.o. [name] Primary Care Temekia Caskey: [name] Other Clinician: Betha Prince Referring Milanni Ayub: Treating Annelise Mccoy/Extender: [name] in Treatment: 1 Active Inactive Abuse / Safety / Falls / Self Care Management Nursing Diagnoses: Potential for falls Goals: Patient/caregiver will verbalize understanding of skin care regimen Date Initiated: 10/18/2022 Target Resolution Date: 10/18/2022 Goal Status: Active Meredith, Prince ([id number]) [phone].pdf Page 6 of 10 Interventions: Assess fall risk on admission and as needed Notes: Necrotic Tissue Nursing Diagnoses: Impaired tissue integrity related to necrotic/devitalized tissue Knowledge deficit related to management of necrotic/devitalized tissue Goals: Necrotic/devitalized tissue will be minimized in the wound bed Date Initiated: 10/18/2022 Target Resolution Date: 10/18/2022 Goal Status: Active Patient/caregiver will verbalize understanding of reason and process for debridement of necrotic tissue Date Initiated: 10/18/2022 Target Resolution Date: 10/18/2022 Goal Status: Active Interventions: Assess patient pain level pre-, during and post procedure and prior to discharge Provide education on necrotic tissue and debridement process Treatment Activities: Apply topical anesthetic as ordered : 10/18/2022 Excisional debridement : 10/18/2022 Notes: Orientation to the Wound Care Program Nursing Diagnoses: Knowledge deficit related to the wound healing center program Goals: Patient/caregiver will verbalize understanding of the Wound Healing Center Program Date Initiated: 10/18/2022 Target Resolution Date: 10/18/2022 Goal Status: Active Interventions: Provide education on orientation to the wound center Notes: Soft Tissue Infection Nursing Diagnoses: Impaired tissue  integrity Potential for infection: soft tissue Goals: Patient will remain free of wound infection Date Initiated: 10/18/2022 Target Resolution Date: 10/18/2022 Goal Status: Active Patient/caregiver will verbalize understanding of or measures to prevent infection and contamination in the home setting Date Initiated: 10/18/2022 Target Resolution Date: 10/18/2022 Goal Status: Active Signs and symptoms of infection will be recognized early to allow for prompt treatment Date Initiated: 10/18/2022 Target Resolution Date: 10/18/2022 Goal Status: Active Interventions: Assess signs and symptoms of infection every visit Notes: Wound/Skin Impairment Nursing Diagnoses: Impaired tissue integrity Goals: Patient/caregiver will verbalize understanding of skin care regimen Date Initiated: 10/18/2022 Target Resolution Date: 10/18/2022 Goal Status: Active Ulcer/skin breakdown will have a volume reduction of 30% by week 4 Date Initiated: 10/18/2022 Target Resolution Date: 11/08/2022 Goal Status: Active Meredith, Prince ([id number]) [phone].pdf Page 7 of 10 Ulcer/skin breakdown will have a volume reduction of 50% by week  8 Date Initiated: 10/18/2022 Target Resolution Date: 12/06/2022 Goal Status: Active Ulcer/skin breakdown will have a volume reduction of 80% by week 12 Date Initiated: 10/18/2022 Target Resolution Date: 01/03/2023 Goal Status: Active Ulcer/skin breakdown will heal within 14 weeks Date Initiated: 10/18/2022 Target Resolution Date: 01/16/2023 Goal Status: Active Interventions: Assess patient/caregiver ability to obtain necessary supplies Assess patient/caregiver ability to perform ulcer/skin care regimen upon admission and as needed Assess ulceration(s) every visit Provide education on ulcer and skin care Treatment Activities: Referred to DME Madolin Twaddle for dressing supplies : 10/18/2022 Skin care regimen initiated : 10/18/2022 Notes: Electronic Signature(s) Signed:  10/25/2022 12:55:20 PM By: Angelina Pih Signed: 10/25/2022 1:13:15 PM By: [name] Entered By: [name] on 10/25/2022 10:21:50 -------------------------------------------------------------------------------- Pain Assessment Details Patient Name: Date of Service: [name], Meredith [name] 10/25/2022 9:30 A M Medical Record Number: [id number] Patient Account Number: [id number] Date of Birth/Sex: Treating RN: 1950-07-26 (72 y.o. [name] Primary Care Oluwadarasimi Redmon: [name] Other Clinician: Betha Prince Referring Susann Lawhorne: Treating Zandrea Kenealy/Extender: [name] in Treatment: 1 Active Problems Location of Pain Severity and Description of Pain Patient Has Paino Yes Site Locations Pain Location: Pain in Ulcers Duration of the Pain. Constant / Intermittento Constant Rate the pain. Current Pain Level: 4 Character of Pain Describe the Pain: Burning, Other: stinging, heavy feeling Meredith, Prince ([id number]) [id number] Page 8 of 10 Pain Management and Medication Current Pain Management: Medication: Yes Cold Application: No Rest: No Massage: No Activity: No T.E.N.S.: No Heat Application: No Leg drop or elevation: No Is the Current Pain Management Adequate: Inadequate How does your wound impact your activities of daily livingo Sleep: No Bathing: No Appetite: No Relationship With Others: No Bladder Continence: No Emotions: No Bowel Continence: No Work: No Toileting: No Drive: No Dressing: No Hobbies: No Electronic Signature(s) Signed: 10/25/2022 12:55:20 PM By: Angelina Pih Signed: 10/25/2022 1:13:15 PM By: [name] Entered By: [name] on 10/25/2022 09:58:47 -------------------------------------------------------------------------------- Patient/Caregiver Education Details Patient Name: Date of Service: [name], Meredith NNA [place] 6/21/2024andnbsp9:30 A M Medical Record Number: [id number] Patient Account  Number: [id number] Date of Birth/Gender: Treating RN: 08/19/1950 (72 y.o. [name] Primary Care Physician: [name] Other Clinician: Betha Prince Referring Physician: Treating Physician/Extender: [name] in Treatment: 1 Education Assessment Education Provided To: Patient Education Topics Provided Wound/Skin Impairment: Handouts: Other: continue wound care as directed Methods: Explain/Verbal Responses: State content correctly Electronic Signature(s) Signed: 10/25/2022 1:13:15 PM By: [name] Entered By: [name] on 10/25/2022 10:22:31 [place] Prince ([id number]) [id number] Page 9 of 10 -------------------------------------------------------------------------------- Wound Assessment Details Patient Name: Date of Service: [name], [place] [name] 10/25/2022 9:30 A M Medical Record Number: [id number] Patient Account Number: [id number] Date of Birth/Sex: Treating RN: 1950/12/16 (72 y.o. [name] Primary Care Lakendra Helling: [name] Other Clinician: Betha Prince Referring Gem Conkle: Treating Devinn Voshell/Extender: [name] in Treatment: 1 Wound Status Wound Number: 1 Primary Etiology: Trauma, Other Wound Location: Right, Midline Lower Leg Wound Status: Open Wounding Event: Trauma Comorbid History: Chronic sinus problems/congestion Date Acquired: 10/08/2022 Weeks Of Treatment: 1 Clustered Wound: Yes Photos Wound Measurements Length: (cm) 7.3 Width: (cm) 3.4 Depth: (cm) 0.2 Clustered Quantity: 3 Area: (cm) 19.4 Volume: (cm) 3.89 % Reduction in Area: 21.2% % Reduction in Volume: 47.5% Epithelialization: Small (1-33%) 94 9 Wound Description Classification: Full Thickness With Exposed Suppo Wound Margin: Flat and Intact Exudate Amount: Large Exudate Type: Serous Exudate Color: amber rt Structures Foul Odor After Cleansing: No  Slough/Fibrino Yes Wound Bed Granulation Amount:  Large (67-100%) Exposed Structure Granulation Quality: Red Fascia Exposed: No Necrotic Amount: Small (1-33%) Fat Layer (Subcutaneous Tissue) Exposed: Yes Necrotic Quality: Adherent Slough Tendon Exposed: No Muscle Exposed: Yes Necrosis of Muscle: No Joint Exposed: No Bone Exposed: No Treatment Notes Wound #1 (Lower Leg) Wound Laterality: Right, Midline Cleanser Vashe 5.8 (oz) Meredith, Prince ([id number]) [id number] Page 10 of 10 Discharge Instruction: Use vashe 5.8 (oz) as directed Peri-Wound Care Topical Mupirocin Ointment Discharge Instruction: Apply as directed by Elbia Paro. Primary Dressing Hydrofera Blue Ready Transfer Foam, 4x5 (in/in) Discharge Instruction: Apply Hydrofera Blue Ready to wound bed as directed (Border) Zetuvit Plus Silicone Border Dressing 5x5 (in/in) Secondary Dressing Secured With Tubigrip Size C, 2.75x10 (in/yd) Discharge Instruction: Apply 3 Tubigrip C 3-finger-widths below knee to base of toes to secure dressing and/or for swelling. Compression Wrap Compression Stockings Add-Ons Electronic Signature(s) Signed: 10/25/2022 12:55:20 PM By: Angelina Pih Signed: 10/25/2022 1:13:15 PM By: [name] Entered By: [name] on 10/25/2022 10:05:42 -------------------------------------------------------------------------------- Vitals Details Patient Name: Date of Service: [name], Meredith [name] 10/25/2022 9:30 A M Medical Record Number: [id number] Patient Account Number: [id number] Date of Birth/Sex: Treating RN: March 13, 1951 (72 y.o. [name], [name] Primary Care Lamin Chandley: [name] Other Clinician: Betha Prince Referring Meklit Cotta: Treating Gordana Kewley/Extender: [name] in Treatment: 1 Vital Signs Time Taken: 09:54 Temperature (F): 98.1 Height (in): 63 Pulse (bpm): 97 Weight (lbs): 128 Respiratory Rate (breaths/min): 16 Body Mass Index (BMI): 22.7 Blood Pressure (mmHg): 169/117 Reference  Range: 80 - 120 mg / dl Electronic Signature(s) Signed: 10/25/2022 1:13:15 PM By: [name] Entered By: [name] on 10/25/2022 09:58:43

## 2022-10-28 ENCOUNTER — Other Ambulatory Visit (INDEPENDENT_AMBULATORY_CARE_PROVIDER_SITE_OTHER): Payer: Self-pay | Admitting: Internal Medicine

## 2022-10-28 DIAGNOSIS — S81801A Unspecified open wound, right lower leg, initial encounter: Secondary | ICD-10-CM

## 2022-10-28 NOTE — Progress Notes (Signed)
KINZIE, WICKES ([id number]) [id number] Page 1 of 7 Visit Report for 10/23/2022 Chief Complaint Document Details Patient Name: Date of Service: [name], Meredith Dakota [name] 10/23/2022 9:00 A M Medical Record Number: [id number] Patient Account Number: [id number] Date of Birth/Sex: Treating RN: May 11, 1950 (72 y.o. [name] Primary Care Provider: Daniel Nones Other Clinician: Referring Provider: Treating Provider/Extender: Jac Canavan in Treatment: 0 Information Obtained from: Patient Chief Complaint Right LE ulcer Electronic Signature(s) Signed: 10/24/2022 12:49:28 PM By: Geralyn Corwin DO Entered By: Geralyn Corwin on 10/23/2022 09:31:07 -------------------------------------------------------------------------------- HPI Details Patient Name: Date of Service: [name], [name] 10/23/2022 9:00 A M Medical Record Number: [id number] Patient Account Number: [id number] Date of Birth/Sex: Treating RN: 09-30-1950 (72 y.o. [name] Primary Care Provider: Daniel Nones Other Clinician: Referring Provider: Treating Provider/Extender: Jac Canavan in Treatment: 0 History of Present Illness HPI Description: 10-18-2022 patient presents today for initial inspection here in our clinic concerning issues that she has been having with a wound on her right anterior lower extremity. Unfortunately the patient had an issue where she was setting up a table and the table was leg had gotten stuck. When she was trying to dislodge it came loose too quickly and she ended up actually falling and this caused a significant issue with a skin tear on the right shin among other things including hitting her head and hurting her neck as well. Fortunately I do not see any signs overall of infection right now but there is definitely some need for sharp debridement in regard to the necrotic tissue at this point on the right anterior shin. This injury  occurred on 10-08-2022 and this is a Teacher, adult education. claim. Patient has an elevated blood pressure reading today but no diagnosis of hypertension she has whitecoat syndrome otherwise she is a fairly healthy individual as far as wound healing is concerned. 6/19; patient was admitted to our clinic last week on 6/14 for traumatic leg wound. Upon having her dressing changed today it was noted she was having more serous drainage from the Opening in the center Of the wound and patient was concerned for infection and came in to be evaluated. Currently she denies fever/chills, increased warmth or erythema to the surrounding skin or Purulent drainage. She has been using collagen and Xeroform to the wound beds. Electronic Signature(s) Signed: 10/24/2022 12:49:28 PM By: Ammie Dalton ([id number]) [id number] Page 2 of 7 Signed: 10/24/2022 12:49:28 PM By: Geralyn Corwin DO Entered By: Geralyn Corwin on 10/23/2022 09:33:06 -------------------------------------------------------------------------------- Physical Exam Details Patient Name: Date of Service: [name], [name] 10/23/2022 9:00 A M Medical Record Number: [id number] Patient Account Number: [id number] Date of Birth/Sex: Treating RN: [birth year] (72 y.o. [name] Primary Care Provider: Daniel Nones Other Clinician: Referring Provider: Treating Provider/Extender: [name] in Treatment: 0 Constitutional . Cardiovascular . Psychiatric . Notes Right lower extremity: T the anterior aspect she has several scattered open wounds limited to skin breakdown with granulation tissue present. However in the o center of this there is an opening that undermines from 6-12 o'clock with deepest tunnel of 4 cm at the 8 o'clock position. There is moderate serous drainage when palpated. Patient expresses pain on exam. Electronic Signature(s) Signed: 10/24/2022 12:49:28 PM By: Geralyn Corwin DO Entered By: Geralyn Corwin on 10/23/2022 09:34:10 -------------------------------------------------------------------------------- Physician Orders Details Patient Name: Date of Service: [name], [name] 10/23/2022 9:00 A M Medical Record Number:  [id number] Patient Account Number: [id number] Date of Birth/Sex: Treating RN: [birth year] (72 y.o. [name] Primary Care Provider: Daniel Nones Other Clinician: Referring Provider: Treating Provider/Extender: Jac Canavan in Treatment: 0 Verbal / Phone Orders: No Diagnosis Coding Follow-up Appointments Return Appointment in 1 week. Nurse Visit as needed Bathing/ Shower/ Hygiene May shower with wound dressing protected with water repellent cover or cast protector. [name], [name] ([id number]) [id number] Page 3 of 7 Anesthetic (Use 'Patient Medications' Section for Anesthetic Order Entry) Lidocaine applied to wound bed Additional Orders / Instructions Follow Nutritious Diet and Increase Protein Intake Medications-Please add to medication list. P.O. Antibiotics Topical A ntibiotic Wound Treatment Wound #1 - Lower Leg Wound Laterality: Right, Midline Cleanser: Vashe 5.8 (oz) 1 x Per Day/30 Days Discharge Instructions: Use vashe 5.8 (oz) as directed Topical: Mupirocin Ointment 1 x Per Day/30 Days Discharge Instructions: Apply as directed by provider. Prim Dressing: (Border) Zetuvit Plus Silicone Border Dressing 5x5 (in/in) (Generic) 1 x Per Day/30 Days ary Consults Vascular - ABI and TBIs right Laboratory Bacteria identified in Wound by Culture (MICRO) - right leg LOINC Code: 6462-6 Convenience Name: Wound culture routine Radiology X-ray, lower leg - Right non-healing wound Patient Medications llergies: iodine, Levaquin, bacitracin, Neosporin (neo-bac-polym) A Notifications Medication Indication Start End 10/23/2022 mupirocin DOSE 1 - topical 2 % ointment - Apply  once daily to the wound beds 10/23/2022 clindamycin HCl DOSE 1 - oral 300 mg capsule - 1 capsule oral four times daily x 7 days Electronic Signature(s) Signed: 10/24/2022 12:49:28 PM By: Geralyn Corwin DO Signed: 10/28/2022 9:09:50 AM By: Elliot Gurney, BSN, RN, CWS, Kim RN, BSN Previous Signature: 10/23/2022 9:43:46 AM Version By: Geralyn Corwin DO Entered By: Elliot Gurney BSN, RN, CWS, Kim on 10/23/2022 09:48:12 Prescription 10/23/2022 -------------------------------------------------------------------------------- [place] Prince. Geralyn Corwin MD Patient Name: Provider: 10/22/1950 [id number] Date of Birth: NPI#: F Sex: DEA #: [phone] [id number] Phone #: License #: UPN: Patient Address: [address] [facility] [place], [place] ([id number]) [id number] Page 4 of 7 [place], [place] [address] [facility] [address], [address] [place], [place] [address] [phone] Allergies iodine; Levaquin; bacitracin; Neosporin (neo-bac-polym) Provider's Orders Vascular - ABI and TBIs right Hand Signature: Date(s): Prescription 10/23/2022 Francisca December J. Geralyn Corwin MD Patient Name: Provider: June 19, 1950 [id number] Date of Birth: NPI#: F Sex: DEA #: [phone] Phone #: License #: UPN: Patient Address: [address] [facility] [place], [place] [address] [facility] [address], [address] [place], [place] [address] [phone] Allergies iodine; Levaquin; bacitracin; Neosporin (neo-bac-polym) Provider's Orders X-ray, lower leg - Right non-healing wound Hand Signature: Date(s): Electronic Signature(s) Signed: 10/24/2022 12:49:28 PM By: Geralyn Corwin DO Signed: 10/28/2022 9:09:50 AM By: Elliot Gurney, BSN, RN, CWS, Kim RN, BSN Entered By: Elliot Gurney, BSN, RN, CWS, Kim on 10/23/2022  09:48:12 -------------------------------------------------------------------------------- Problem List Details Patient Name: Date of Service: [name], [name] 10/23/2022 9:00 A M Medical Record Number: [id number] Patient Account Number: [id number] Date of Birth/Sex: Treating RN: 05-12-1950 (72 y.o. [name] Primary Care Provider: Daniel Nones Other Clinician: Referring Provider: Treating Provider/Extender: Jac Canavan in Treatment: 0 Active Problems ICD-10 Encounter Code Description Active Date MDM Diagnosis S81.811A Laceration without foreign body, right lower leg, initial encounter 10/18/2022 No Yes [name], [name] ([id number]) [phone].pdf Page 5 of 7 [phone] Non-pressure chronic ulcer of other part of right lower leg with fat layer 10/18/2022 No Yes exposed R03.0 Elevated blood-pressure  reading, without diagnosis of hypertension 10/18/2022 No Yes Inactive Problems Resolved Problems Electronic Signature(s) Signed: 10/24/2022 12:49:28 PM By: Geralyn Corwin DO Entered By: Geralyn Corwin on 10/23/2022 09:31:03 -------------------------------------------------------------------------------- Progress Note Details Patient Name: Date of Service: [name], [name] 10/23/2022 9:00 A M Medical Record Number: [id number] Patient Account Number: [id number] Date of Birth/Sex: Treating RN: January 18, 1951 (72 y.o. [name] Primary Care Provider: Daniel Nones Other Clinician: Referring Provider: Treating Provider/Extender: Jac Canavan in Treatment: 0 Subjective Chief Complaint Information obtained from Patient Right LE ulcer History of Present Illness (HPI) 10-18-2022 patient presents today for initial inspection here in our clinic concerning issues that she has been having with a wound on her right anterior lower extremity. Unfortunately the patient had an issue where she was setting up a table and the table was  leg had gotten stuck. When she was trying to dislodge it came loose too quickly and she ended up actually falling and this caused a significant issue with a skin tear on the right shin among other things including hitting her head and hurting her neck as well. Fortunately I do not see any signs overall of infection right now but there is definitely some need for sharp debridement in regard to the necrotic tissue at this point on the right anterior shin. 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 claim. Patient has an elevated blood pressure reading today but no diagnosis of hypertension she has whitecoat syndrome otherwise she is a fairly healthy individual as far as wound healing is concerned. 6/19; patient was admitted to our clinic last week on 6/14 for traumatic leg wound. Upon having her dressing changed today it was noted she was having more serous drainage from the Opening in the center Of the wound and patient was concerned for infection and came in to be evaluated. Currently she denies fever/chills, increased warmth or erythema to the surrounding skin or Purulent drainage. She has been using collagen and Xeroform to the wound beds. Objective Constitutional Vitals Time Taken: 9:00 AM, Height: 63 in, Weight: 128 lbs, BMI: 22.7, Temperature: 97.9 F, Pulse: 77 bpm, Respiratory Rate: 16 breaths/min, Blood Pressure: 169/116 mmHg. General Notes: Patient state she has white coat syndrome. [name], [name] ([id number]) [id number] Page 6 of 7 General Notes: Right lower extremity: T the anterior aspect she has several scattered open wounds limited to skin breakdown with granulation tissue present. o However in the center of this there is an opening that undermines from 6-12 o'clock with deepest tunnel of 4 cm at the 8 o'clock position. There is moderate serous drainage when palpated. Patient expresses pain on exam. Integumentary (Hair, Skin) Wound #1  status is Open. Original cause of wound was Trauma. The date acquired was: 10/08/2022. The wound is located on the Right,Midline Lower Leg. The wound measures 7cm length x 4cm width x 0.2cm depth; 21.991cm^2 area and 4.398cm^3 volume. There is muscle and Fat Layer (Subcutaneous Tissue) exposed. There is no tunneling or undermining noted. There is a medium amount of serous drainage noted. The wound margin is flat and intact. There is medium (34-66%) red granulation within the wound bed. There is a medium (34-66%) amount of necrotic tissue within the wound bed including Adherent Slough. Assessment Active Problems ICD-10 Laceration without foreign body, right lower leg, initial encounter Non-pressure chronic ulcer of other part of right lower leg with fat layer exposed Elevated blood-pressure reading, without diagnosis of hypertension Patient has a history of traumatic  leg wound from a table she was trying to set up at work. She has fairly superficial open areas except for 1 area that has opened up in the center that has significant undermining. She now has serous drainage with pain on exam I obtained a wound culture. She reports completing a course of Keflex after the event for 1 week but has completed this course. I will go ahead and give her oral antibiotics. For now she can use mupirocin ointment to all the wound beds and keep the area covered and change daily. We will also obtain an x-ray to the leg due to her pain. Her ABIs were noncompressible and I recommended following through and getting ABIs with TBI's to assure adequate blood flow. She will follow-up later this week for evaluation. She knows to go to the ED if she were to develop symptoms of increased pain, erythema, warmth or purulent drainage to her leg. Plan Follow-up Appointments: Return Appointment in 1 week. Nurse Visit as needed Bathing/ Shower/ Hygiene: May shower with wound dressing protected with water repellent cover or cast  protector. Anesthetic (Use 'Patient Medications' Section for Anesthetic Order Entry): Lidocaine applied to wound bed Additional Orders / Instructions: Follow Nutritious Diet and Increase Protein Intake Medications-Please add to medication list.: P.O. Antibiotics Topical Antibiotic Laboratory ordered were: Wound culture routine - right leg Consults ordered were: Vascular - ABI and TBIs right Radiology ordered were: X-ray, lower leg - Right non-healing wound WOUND #1: - Lower Leg Wound Laterality: Right, Midline Cleanser: Vashe 5.8 (oz) 1 x Per Day/30 Days Discharge Instructions: Use vashe 5.8 (oz) as directed Topical: Mupirocin Ointment 1 x Per Day/30 Days Discharge Instructions: Apply as directed by provider. Prim Dressing: (Border) Zetuvit Plus Silicone Border Dressing 5x5 (in/in) (Generic) 1 x Per Day/30 Days ary 1. Right leg x-ray 2. Mupirocin 3. Clindamycin 4. Wound culture 5. ABIs with TBI's Electronic Signature(s) Signed: 10/24/2022 12:49:28 PM By: Geralyn Corwin DO Entered By: Geralyn Corwin on 10/23/2022 09:38:41 [name] ([id number]) [id number] Page 7 of 7 -------------------------------------------------------------------------------- SuperBill Details Patient Name: Date of Service: [name], Meredith Dakota [name] 10/23/2022 Medical Record Number: [id number] Patient Account Number: [id number] Date of Birth/Sex: Treating RN: 1950-12-12 (73 y.o. Cathlean Cower, Kim Primary Care Provider: Daniel Nones Other Clinician: Referring Provider: Treating Provider/Extender: Jac Canavan in Treatment: 0 Diagnosis Coding ICD-10 Codes Code Description [phone] Laceration without foreign body, right lower leg, initial encounter L97.812 Non-pressure chronic ulcer of other part of right lower leg with fat layer exposed R03.0 Elevated blood-pressure reading, without diagnosis of hypertension Facility Procedures : CPT4 Code:  [id number] Description: 99213 - WOUND CARE VISIT-LEV 3 EST PT Modifier: Quantity: 1 Physician Procedures : CPT4 Code Description Modifier [id number] - WC PHYS LEVEL 4 - EST PT ICD-10 Diagnosis Description S81.811A Laceration without foreign body, right lower leg, initial encounter L97.812 Non-pressure chronic ulcer of other part of right lower leg with  fat layer exposed R03.0 Elevated blood-pressure reading, without diagnosis of hypertension Quantity: 1 Electronic Signature(s) Signed: 10/24/2022 12:49:28 PM By: Geralyn Corwin DO Signed: 10/28/2022 9:09:50 AM By: Elliot Gurney, BSN, RN, CWS, Kim RN, BSN Entered By: Elliot Gurney, BSN, RN, CWS, Kim on 10/23/2022 09:49:12

## 2022-10-28 NOTE — Progress Notes (Signed)
[name], [name] ([id number]) [id number] Page 1 of 10 Visit Report for 10/23/2022 Arrival Information Details Patient Name: Date of Service: [name], [place] [name] 10/23/2022 9:00 A M Medical Record Number: [id number] Patient Account Number: [id number] Date of Birth/Sex: Treating RN: February 14, 1951 (72 y.Meredith. Skip Mayer Primary Care Moo Gravley: Daniel Nones Other Clinician: Referring Garron Eline: Treating Siyon Linck/Extender: Jac Canavan in Treatment: 0 Visit Information History Since Last Visit Added or deleted any medications: No Patient Arrived: Ambulatory Has Dressing in Place as Prescribed: Yes Arrival Time: 08:59 Pain Present Now: Yes Accompanied By: self Transfer Assistance: None Patient Identification Verified: Yes Secondary Verification Process Completed: Yes Patient Requires Transmission-Based No Precautions: Patient Has Alerts: Yes Patient Alerts: 6/14Non-compressible >220 Electronic Signature(s) Signed: 10/23/2022 11:48:00 AM By: Elliot Gurney, BSN, RN, CWS, Kim RN, BSN Entered By: Elliot Gurney, BSN, RN, CWS, Kim on 10/23/2022 11:48:00 -------------------------------------------------------------------------------- Clinic Level of Care Assessment Details Patient Name: Date of Service: [name], [name] 10/23/2022 9:00 A M Medical Record Number: [id number] Patient Account Number: [id number] Date of Birth/Sex: Treating RN: August 31, 1950 (72 y.Meredith. Skip Mayer Primary Care Olander Friedl: Daniel Nones Other Clinician: Referring Ashling Roane: Treating Allister Lessley/Extender: Jac Canavan in Treatment: 0 Clinic Level of Care Assessment Items TOOL 4 Quantity Score []  - 0 Use when only an EandM is performed on FOLLOW-UP visit ASSESSMENTS - Nursing Assessment / Reassessment X- 1 10 Reassessment of Co-morbidities (includes updates in patient status) X- 1 5 Reassessment of Adherence to Treatment Plan ASSESSMENTS - Wound and Skin A ssessment /  Reassessment X - Simple Wound Assessment / Reassessment - one wound 1 5 OLIA, [name] ([id number]) [id number] Page 2 of 10 []  - 0 Complex Wound Assessment / Reassessment - multiple wounds []  - 0 Dermatologic / Skin Assessment (not related to wound area) ASSESSMENTS - Focused Assessment []  - 0 Circumferential Edema Measurements - multi extremities []  - 0 Nutritional Assessment / Counseling / Intervention []  - 0 Lower Extremity Assessment (monofilament, tuning fork, pulses) []  - 0 Peripheral Arterial Disease Assessment (using hand held doppler) ASSESSMENTS - Ostomy and/or Continence Assessment and Care []  - 0 Incontinence Assessment and Management []  - 0 Ostomy Care Assessment and Management (repouching, etc.) PROCESS - Coordination of Care X - Simple Patient / Family Education for ongoing care 1 15 []  - 0 Complex (extensive) Patient / Family Education for ongoing care X- 1 10 Staff obtains Chiropractor, Records, T Results / Process Orders est []  - 0 Staff telephones HHA, Nursing Homes / Clarify orders / etc []  - 0 Routine Transfer to another Facility (non-emergent condition) []  - 0 Routine Hospital Admission (non-emergent condition) []  - 0 New Admissions / Manufacturing engineer / Ordering NPWT Apligraf, etc. , []  - 0 Emergency Hospital Admission (emergent condition) X- 1 10 Simple Discharge Coordination []  - 0 Complex (extensive) Discharge Coordination PROCESS - Special Needs []  - 0 Pediatric / Minor Patient Management []  - 0 Isolation Patient Management []  - 0 Hearing / Language / Visual special needs []  - 0 Assessment of Community assistance (transportation, D/C planning, etc.) []  - 0 Additional assistance / Altered mentation []  - 0 Support Surface(s) Assessment (bed, cushion, seat, etc.) INTERVENTIONS - Wound Cleansing / Measurement X - Simple Wound Cleansing - one wound 1 5 []  - 0 Complex Wound Cleansing - multiple wounds X- 1  5 Wound Imaging (photographs - any number of wounds) []  - 0 Wound Tracing (instead of photographs) X- 1 5 Simple Wound Measurement - one wound []  -  0 Complex Wound Measurement - multiple wounds INTERVENTIONS - Wound Dressings []  - 0 Small Wound Dressing one or multiple wounds X- 1 15 Medium Wound Dressing one or multiple wounds []  - 0 Large Wound Dressing one or multiple wounds X- 1 5 Application of Medications - topical []  - 0 Application of Medications - injection INTERVENTIONS - Miscellaneous []  - 0 External ear exam []  - 0 Specimen Collection (cultures, biopsies, blood, body fluids, etc.) [name], [name] ([id number]) [id number] Page 3 of 10 []  - 0 Specimen(s) / Culture(s) sent or taken to Lab for analysis []  - 0 Patient Transfer (multiple staff / Michiel Sites Lift / Similar devices) []  - 0 Simple Staple / Suture removal (25 or less) []  - 0 Complex Staple / Suture removal (26 or more) []  - 0 Hypo / Hyperglycemic Management (close monitor of Blood Glucose) []  - 0 Ankle / Brachial Index (ABI) - do not check if billed separately X- 1 5 Vital Signs Has the patient been seen at the hospital within the last three years: Yes Total Score: 95 Level Of Care: New/Established - Level 3 Electronic Signature(s) Signed: 10/28/2022 9:09:50 AM By: Elliot Gurney, BSN, RN, CWS, Kim RN, BSN Entered By: Elliot Gurney, BSN, RN, CWS, Kim on 10/23/2022 09:49:01 -------------------------------------------------------------------------------- Encounter Discharge Information Details Patient Name: Date of Service: [name], [name] 10/23/2022 9:00 A M Medical Record Number: [id number] Patient Account Number: [id number] Date of Birth/Sex: Treating RN: 03/06/1951 (72 y.Meredith. Skip Mayer Primary Care Eldred Sooy: Daniel Nones Other Clinician: Referring Roshawnda Pecora: Treating [place] Surges/Extender: Jac Canavan in Treatment: 0 Encounter Discharge Information Items Discharge  Condition: Stable Ambulatory Status: Ambulatory Discharge Destination: Home Transportation: Private Auto Schedule Follow-up Appointment: Yes Clinical Summary of Care: Electronic Signature(s) Signed: 10/28/2022 9:09:50 AM By: Elliot Gurney, BSN, RN, CWS, Kim RN, BSN Entered By: Elliot Gurney, BSN, RN, CWS, Kim on 10/23/2022 09:49:43 -------------------------------------------------------------------------------- Lower Extremity Assessment Details Patient Name: Date of Service: [name], [name] 10/23/2022 9:00 A M Medical Record Number: [id number] Patient Account Number: [id number] Date of Birth/Sex: Treating RN: [birth year] (23 y.Meredith. Skip Mayer Primary Care Linn Goetze: Daniel Nones Other Clinician: [place] [name] ([id number]) [id number] Page 4 of 10 Referring Daelin Haste: Treating Tambria Pfannenstiel/Extender: Jac Canavan in Treatment: 0 Edema Assessment Assessed: [Left: No] [Right: No] [Left: Edema] [Right: :] Calf Left: Right: Point of Measurement: 31 cm From Medial Instep 34 cm Ankle Left: Right: Point of Measurement: 8 cm From Medial Instep 21 cm Knee To Floor Left: Right: From Medial Instep 39 cm Vascular Assessment Pulses: Dorsalis Pedis Palpable: [Right:Yes] Electronic Signature(s) Signed: 10/28/2022 9:09:50 AM By: Elliot Gurney, BSN, RN, CWS, Kim RN, BSN Entered By: Elliot Gurney, BSN, RN, CWS, Kim on 10/23/2022 09:17:26 -------------------------------------------------------------------------------- Multi Wound Chart Details Patient Name: Date of Service: [name], [name] 10/23/2022 9:00 A M Medical Record Number: [id number] Patient Account Number: [id number] Date of Birth/Sex: Treating RN: Feb 27, 1951 (33 y.Meredith. Skip Mayer Primary Care Chanita Boden: Daniel Nones Other Clinician: Referring Kajal Scalici: Treating Kelven Flater/Extender: Jac Canavan in Treatment: 0 Vital Signs Height(in): 63 Pulse(bpm): 77 Weight(lbs): 128 Blood Pressure(mmHg):  169/116 Body Mass Index(BMI): 22.7 Temperature(F): 97.9 Respiratory Rate(breaths/min): 16 [1:Photos:] [N/A:N/A] Right, Midline Lower Leg N/A N/A Wound Location: [name], [name] ([id number]) [id number] Page 5 of 10 Trauma N/A N/A Wounding Event: Trauma, Other N/A N/A Primary Etiology: Chronic sinus problems/congestion N/A N/A Comorbid History: 10/08/2022 N/A N/A Date Acquired: 0 N/A N/A Weeks of Treatment: Open N/A N/A Wound Status: No N/A N/A Wound Recurrence:  Yes N/A N/A Clustered Wound: 3 N/A N/A Clustered Quantity: 7x4x0.2 N/A N/A Measurements L x W x D (cm) 21.991 N/A N/A A (cm) : rea 4.398 N/A N/A Volume (cm) : 11.10% N/A N/A % Reduction in A rea: 40.70% N/A N/A % Reduction in Volume: Full Thickness With Exposed Support N/A N/A Classification: Structures Medium N/A N/A Exudate Amount: Serous N/A N/A Exudate Type: amber N/A N/A Exudate Color: Flat and Intact N/A N/A Wound Margin: Medium (34-66%) N/A N/A Granulation Amount: Red N/A N/A Granulation Quality: Medium (34-66%) N/A N/A Necrotic Amount: Fat Layer (Subcutaneous Tissue): Yes N/A N/A Exposed Structures: Muscle: Yes Fascia: No Tendon: No Joint: No Bone: No Small (1-33%) N/A N/A Epithelialization: Treatment Notes Electronic Signature(s) Signed: 10/28/2022 9:09:50 AM By: Elliot Gurney, BSN, RN, CWS, Kim RN, BSN Entered By: Elliot Gurney, BSN, RN, CWS, Kim on 10/23/2022 09:47:25 -------------------------------------------------------------------------------- Multi-Disciplinary Care Plan Details Patient Name: Date of Service: [name], [name] 10/23/2022 9:00 A M Medical Record Number: [id number] Patient Account Number: [id number] Date of Birth/Sex: Treating RN: Sep 23, 1950 (64 y.Meredith. Skip Mayer Primary Care Dev Dhondt: Daniel Nones Other Clinician: Referring Arda Keadle: Treating Grabiela Wohlford/Extender: Jac Canavan in Treatment: 0 Active Inactive Abuse / Safety /  Falls / Self Care Management Nursing Diagnoses: Potential for falls Goals: Patient/caregiver will verbalize understanding of skin care regimen Date Initiated: 10/18/2022 Target Resolution Date: 10/18/2022 Goal Status: Active Interventions: Assess fall risk on admission and as needed Notes: [name], [name] ([id number]) [phone].pdf Page 6 of 10 Necrotic Tissue Nursing Diagnoses: Impaired tissue integrity related to necrotic/devitalized tissue Knowledge deficit related to management of necrotic/devitalized tissue Goals: Necrotic/devitalized tissue will be minimized in the wound bed Date Initiated: 10/18/2022 Target Resolution Date: 10/18/2022 Goal Status: Active Patient/caregiver will verbalize understanding of reason and process for debridement of necrotic tissue Date Initiated: 10/18/2022 Target Resolution Date: 10/18/2022 Goal Status: Active Interventions: Assess patient pain level pre-, during and post procedure and prior to discharge Provide education on necrotic tissue and debridement process Treatment Activities: Apply topical anesthetic as ordered : 10/18/2022 Excisional debridement : 10/18/2022 Notes: Orientation to the Wound Care Program Nursing Diagnoses: Knowledge deficit related to the wound healing center program Goals: Patient/caregiver will verbalize understanding of the Wound Healing Center Program Date Initiated: 10/18/2022 Target Resolution Date: 10/18/2022 Goal Status: Active Interventions: Provide education on orientation to the wound center Notes: Soft Tissue Infection Nursing Diagnoses: Impaired tissue integrity Potential for infection: soft tissue Goals: Patient will remain free of wound infection Date Initiated: 10/18/2022 Target Resolution Date: 10/18/2022 Goal Status: Active Patient/caregiver will verbalize understanding of or measures to prevent infection and contamination in the home setting Date Initiated: 10/18/2022 Target  Resolution Date: 10/18/2022 Goal Status: Active Signs and symptoms of infection will be recognized early to allow for prompt treatment Date Initiated: 10/18/2022 Target Resolution Date: 10/18/2022 Goal Status: Active Interventions: Assess signs and symptoms of infection every visit Notes: Wound/Skin Impairment Nursing Diagnoses: Impaired tissue integrity Goals: Patient/caregiver will verbalize understanding of skin care regimen Date Initiated: 10/18/2022 Target Resolution Date: 10/18/2022 Goal Status: Active Ulcer/skin breakdown will have a volume reduction of 30% by week 4 Date Initiated: 10/18/2022 Target Resolution Date: 11/08/2022 Goal Status: Active Ulcer/skin breakdown will have a volume reduction of 50% by week 8 Date Initiated: 10/18/2022 Target Resolution Date: 12/06/2022 Goal Status: Active Ulcer/skin breakdown will have a volume reduction of 80% by week 12 Date Initiated: 10/18/2022 Target Resolution Date: 01/03/2023 [name], [name] ([id number]) [phone].pdf Page 7 of 10 Goal Status: Active Ulcer/skin breakdown will  heal within 14 weeks Date Initiated: 10/18/2022 Target Resolution Date: 01/16/2023 Goal Status: Active Interventions: Assess patient/caregiver ability to obtain necessary supplies Assess patient/caregiver ability to perform ulcer/skin care regimen upon admission and as needed Assess ulceration(s) every visit Provide education on ulcer and skin care Treatment Activities: Referred to DME Kei Langhorst for dressing supplies : 10/18/2022 Skin care regimen initiated : 10/18/2022 Notes: Electronic Signature(s) Signed: 10/23/2022 11:48:44 AM By: Elliot Gurney, BSN, RN, CWS, Kim RN, BSN Entered By: Elliot Gurney, BSN, RN, CWS, Kim on 10/23/2022 11:48:44 -------------------------------------------------------------------------------- Pain Assessment Details Patient Name: Date of Service: [name], [name] 10/23/2022 9:00 A M Medical Record Number: [id number] Patient  Account Number: [id number] Date of Birth/Sex: Treating RN: 16-Oct-1950 (19 y.Meredith. Skip Mayer Primary Care Imara Standiford: Daniel Nones Other Clinician: Referring Kiam Bransfield: Treating Brixton Schnapp/Extender: Jac Canavan in Treatment: 0 Active Problems Location of Pain Severity and Description of Pain Patient Has Paino Yes Site Locations Pain Location: Generalized Pain, Pain in Ulcers Rate the pain. Current Pain Level: 4 Pain Management and Medication Current Pain Management: Notes Back and neck pain. [name], [name] ([id number]) [id number] Page 8 of 10 Electronic Signature(s) Signed: 10/28/2022 9:09:50 AM By: Elliot Gurney, BSN, RN, CWS, Kim RN, BSN Entered By: Elliot Gurney, BSN, RN, CWS, Kim on 10/23/2022 09:07:11 -------------------------------------------------------------------------------- Patient/Caregiver Education Details Patient Name: Date of Service: [name], [name] 6/19/2024andnbsp9:00 A M Medical Record Number: [id number] Patient Account Number: [id number] Date of Birth/Gender: Treating RN: [birth year] (1 y.Meredith. Skip Mayer Primary Care Physician: Daniel Nones Other Clinician: Referring Physician: Treating Physician/Extender: Jac Canavan in Treatment: 0 Education Assessment Education Provided To: Patient Education Topics Provided Infection: Handouts: Hygiene and Infection Prevention Methods: Explain/Verbal Responses: State content correctly Safety: Handouts: Safety Methods: Explain/Verbal Responses: State content correctly Wound/Skin Impairment: Handouts: Caring for Your Ulcer, Other: continue wound care as prescribed. Methods: Explain/Verbal Electronic Signature(s) Signed: 10/28/2022 9:09:50 AM By: Elliot Gurney, BSN, RN, CWS, Kim RN, BSN Entered By: Elliot Gurney, BSN, RN, CWS, Kim on 10/23/2022 11:49:49 -------------------------------------------------------------------------------- Wound Assessment Details Patient Name:  Date of Service: [name], [name] 10/23/2022 9:00 A M Medical Record Number: [id number] Patient Account Number: [id number] Date of Birth/Sex: Treating RN: [birth year] (27 y.Meredith. Skip Mayer Primary Care Lyann Hagstrom: Daniel Nones Other Clinician: Referring Daryan Cagley: Treating Deazia Lampi/Extender: Jac Canavan in Treatment: 0 [name], [name] ([id number]) [id number] Page 9 of 10 Wound Status Wound Number: 1 Primary Etiology: Trauma, Other Wound Location: Right, Midline Lower Leg Wound Status: Open Wounding Event: Trauma Comorbid History: Chronic sinus problems/congestion Date Acquired: 10/08/2022 Weeks Of Treatment: 0 Clustered Wound: Yes Photos Wound Measurements Length: (cm) 7 Width: (cm) 4 Depth: (cm) 0.2 Clustered Quantity: 3 Area: (cm) 21.991 Volume: (cm) 4.398 % Reduction in Area: 11.1% % Reduction in Volume: 40.7% Epithelialization: Small (1-33%) Tunneling: No Undermining: Yes Starting Position (Meredith'clock): 6 Ending Position (Meredith'clock): 12 Maximum Distance: (cm) 4 Wound Description Classification: Full Thickness With Exposed Support Structures Wound Margin: Flat and Intact Exudate Amount: Large Exudate Type: Serous Exudate Color: amber Foul Odor After Cleansing: No Slough/Fibrino Yes Wound Bed Granulation Amount: Large (67-100%) Exposed Structure Granulation Quality: Red Fascia Exposed: No Necrotic Amount: Small (1-33%) Fat Layer (Subcutaneous Tissue) Exposed: Yes Necrotic Quality: Adherent Slough Tendon Exposed: No Muscle Exposed: Yes Necrosis of Muscle: No Joint Exposed: No Bone Exposed: No Assessment Notes There is a hole with seroma in the middle of the wound UM 6-12 4cm deep. Large amount of yellow drainage when probed with cotton-tipped applicator. Culture done  of fluid. Electronic Signature(s) Signed: 10/23/2022 11:46:29 AM By: Elliot Gurney, BSN, RN, CWS, Kim RN, BSN Entered By: Elliot Gurney, BSN, RN, CWS, Kim on 10/23/2022  11:46:29 Vitals Details --------------------------------------------------------------------------------  [name] ([id number]) [id number] Page 10 of 10 Patient Name: Date of Service: [name], [place] [name] 10/23/2022 9:00 A M Medical Record Number: [id number] Patient Account Number: [id number] Date of Birth/Sex: Treating RN: 05/01/1951 (64 y.Meredith. Cathlean Cower, Kim Primary Care Adren Dollins: Daniel Nones Other Clinician: Referring Maylani Embree: Treating Joson Sapp/Extender: Jac Canavan in Treatment: 0 Vital Signs Time Taken: 09:00 Temperature (F): 97.9 Height (in): 63 Pulse (bpm): 77 Weight (lbs): 128 Respiratory Rate (breaths/min): 16 Body Mass Index (BMI): 22.7 Blood Pressure (mmHg): 169/116 Reference Range: 80 - 120 mg / dl Notes Patient state she has white coat syndrome. Electronic Signature(s) Signed: 10/28/2022 9:09:50 AM By: Elliot Gurney, BSN, RN, CWS, Kim RN, BSN Entered By: Elliot Gurney, BSN, RN, CWS, Kim on 10/23/2022 64:40:34

## 2022-10-29 ENCOUNTER — Other Ambulatory Visit: Payer: Self-pay

## 2022-10-29 MED ORDER — SULFAMETHOXAZOLE-TRIMETHOPRIM 800-160 MG PO TABS
1.0000 | ORAL_TABLET | Freq: Two times a day (BID) | ORAL | 0 refills | Status: DC
  Filled 2022-10-29: qty 28, 14d supply, fill #0

## 2022-11-01 ENCOUNTER — Encounter: Payer: PRIVATE HEALTH INSURANCE | Admitting: Physician Assistant

## 2022-11-01 ENCOUNTER — Ambulatory Visit (HOSPITAL_COMMUNITY)
Admission: RE | Admit: 2022-11-01 | Discharge: 2022-11-01 | Disposition: A | Discharge status: Home / Self Care | Payer: Worker's Compensation | Source: Ambulatory Visit | Attending: Internal Medicine | Admitting: Internal Medicine

## 2022-11-01 DIAGNOSIS — S81801A Unspecified open wound, right lower leg, initial encounter: Secondary | ICD-10-CM | POA: Diagnosis not present

## 2022-11-01 DIAGNOSIS — S81811A Laceration without foreign body, right lower leg, initial encounter: Secondary | ICD-10-CM | POA: Diagnosis not present

## 2022-11-01 LAB — VAS US ABI WITH/WO TBI
Left ABI: 1.25
Right ABI: 1.13

## 2022-11-01 NOTE — Progress Notes (Signed)
[name] ([id number]) [phone].pdf Page 1 of 9 Visit Report for 11/01/2022 Arrival Information Details Patient Name: Date of Service: [name], [place] [name] 11/01/2022 11:15 A M Medical Record Number: [id number] Patient Account Number: [id number] Date of Birth/Sex: Treating RN: 12-06-1950 (72 y.o. [name] Primary Care Kanan Sobek: Daniel Nones Other Clinician: Referring Adyson Vanburen: Treating Kynlei Piontek/Extender: Lois Huxley in Treatment: 2 Visit Information History Since Last Visit Added or deleted any medications: No Patient Arrived: Ambulatory Any new allergies or adverse reactions: No Arrival Time: 11:16 Had a fall or experienced change in No Accompanied By: self activities of daily living that may affect Transfer Assistance: Manual risk of falls: Patient Identification Verified: Yes Hospitalized since last visit: No Secondary Verification Process Completed: Yes Has Dressing in Place as Prescribed: Yes Patient Requires Transmission-Based No Has Compression in Place as Prescribed: Yes Precautions: Pain Present Now: Yes Patient Has Alerts: Yes Patient Alerts: 6/14Non-compressible >220 Electronic Signature(s) Signed: 11/01/2022 1:04:08 PM By: Angelina Pih Entered By: Angelina Pih on 11/01/2022 11:16:19 -------------------------------------------------------------------------------- Clinic Level of Care Assessment Details Patient Name: Date of Service: Meredith [place] J. 11/01/2022 11:15 A M Medical Record Number: [id number] Patient Account Number: [id number] Date of Birth/Sex: Treating RN: Mar 11, 1951 (72 y.o. [name] Primary Care Blaike Newburn: Daniel Nones Other Clinician: Referring Demetrio Leighty: Treating Obaloluwa Delatte/Extender: Lois Huxley in Treatment: 2 Clinic Level of Care Assessment Items TOOL 4 Quantity Score []  - 0 Use when only an EandM is performed on FOLLOW-UP visit ASSESSMENTS - Nursing  Assessment / Reassessment X- 1 10 Reassessment of Co-morbidities (includes updates in patient status) X- 1 5 Reassessment of Adherence to Treatment Plan ASSESSMENTS - Wound and Skin A ssessment / Reassessment X - Simple Wound Assessment / Reassessment - one wound 1 5 JAKENYA, THRONEBERRY J ([id number]) [id number] Page 2 of 9 []  - 0 Complex Wound Assessment / Reassessment - multiple wounds []  - 0 Dermatologic / Skin Assessment (not related to wound area) ASSESSMENTS - Focused Assessment []  - 0 Circumferential Edema Measurements - multi extremities []  - 0 Nutritional Assessment / Counseling / Intervention []  - 0 Lower Extremity Assessment (monofilament, tuning fork, pulses) []  - 0 Peripheral Arterial Disease Assessment (using hand held doppler) ASSESSMENTS - Ostomy and/or Continence Assessment and Care []  - 0 Incontinence Assessment and Management []  - 0 Ostomy Care Assessment and Management (repouching, etc.) PROCESS - Coordination of Care X - Simple Patient / Family Education for ongoing care 1 15 []  - 0 Complex (extensive) Patient / Family Education for ongoing care X- 1 10 Staff obtains Chiropractor, Records, T Results / Process Orders est []  - 0 Staff telephones HHA, Nursing Homes / Clarify orders / etc []  - 0 Routine Transfer to another Facility (non-emergent condition) []  - 0 Routine Hospital Admission (non-emergent condition) []  - 0 New Admissions / Manufacturing engineer / Ordering NPWT Apligraf, etc. , []  - 0 Emergency Hospital Admission (emergent condition) X- 1 10 Simple Discharge Coordination []  - 0 Complex (extensive) Discharge Coordination PROCESS - Special Needs []  - 0 Pediatric / Minor Patient Management []  - 0 Isolation Patient Management []  - 0 Hearing / Language / Visual special needs []  - 0 Assessment of Community assistance (transportation, D/C planning, etc.) []  - 0 Additional assistance / Altered mentation []  -  0 Support Surface(s) Assessment (bed, cushion, seat, etc.) INTERVENTIONS - Wound Cleansing / Measurement X - Simple Wound Cleansing - one wound 1 5 []  - 0 Complex Wound Cleansing - multiple wounds X-  1 5 Wound Imaging (photographs - any number of wounds) []  - 0 Wound Tracing (instead of photographs) X- 1 5 Simple Wound Measurement - one wound []  - 0 Complex Wound Measurement - multiple wounds INTERVENTIONS - Wound Dressings X - Small Wound Dressing one or multiple wounds 1 10 []  - 0 Medium Wound Dressing one or multiple wounds []  - 0 Large Wound Dressing one or multiple wounds X- 1 5 Application of Medications - topical []  - 0 Application of Medications - injection INTERVENTIONS - Miscellaneous []  - 0 External ear exam []  - 0 Specimen Collection (cultures, biopsies, blood, body fluids, etc.) [name] ([id number]) [id number] Page 3 of 9 []  - 0 Specimen(s) / Culture(s) sent or taken to Lab for analysis []  - 0 Patient Transfer (multiple staff / Nurse, adult / Similar devices) []  - 0 Simple Staple / Suture removal (25 or less) []  - 0 Complex Staple / Suture removal (26 or more) []  - 0 Hypo / Hyperglycemic Management (close monitor of Blood Glucose) []  - 0 Ankle / Brachial Index (ABI) - do not check if billed separately X- 1 5 Vital Signs Has the patient been seen at the hospital within the last three years: Yes Total Score: 90 Level Of Care: New/Established - Level 3 Electronic Signature(s) Signed: 11/01/2022 1:04:08 PM By: Angelina Pih Entered By: Angelina Pih on 11/01/2022 12:47:12 -------------------------------------------------------------------------------- Encounter Discharge Information Details Patient Name: Date of Service: [name], [name] 11/01/2022 11:15 A M Medical Record Number: [id number] Patient Account Number: [id number] Date of Birth/Sex: Treating RN: 1950-07-04 (72 y.o. [name] Primary Care Waylin Dorko:  Daniel Nones Other Clinician: Referring Bayan Kushnir: Treating Radiance Deady/Extender: Lois Huxley in Treatment: 2 Encounter Discharge Information Items Discharge Condition: Stable Ambulatory Status: Ambulatory Discharge Destination: Home Transportation: Private Auto Accompanied By: self Schedule Follow-up Appointment: Yes Clinical Summary of Care: Electronic Signature(s) Signed: 11/01/2022 12:49:03 PM By: Angelina Pih Entered By: Angelina Pih on 11/01/2022 12:49:03 -------------------------------------------------------------------------------- Lower Extremity Assessment Details Patient Name: Date of Service: Meredith Ne[place] [name] 11/01/2022 11:15 A M Medical Record Number: [id number] Patient Account Number: [id number] Date of Birth/Sex: Treating RN: August 28, 1950 (72 y.o. [name] [place], [place] J ([id number]) [id number] Page 4 of 9 Primary Care Jilda Kress: Daniel Nones Other Clinician: Referring [place] Grosser: Treating Aoi Kouns/Extender: [name] in Treatment: 2 Edema Assessment Assessed: [Left: No] [Right: No] Edema: [Left: Ye] [Right: s] Calf Left: Right: Point of Measurement: 31 cm From Medial Instep 32.2 cm Ankle Left: Right: Point of Measurement: 8 cm From Medial Instep 20 cm Vascular Assessment Pulses: Dorsalis Pedis Palpable: [Right:Yes] Electronic Signature(s) Signed: 11/01/2022 1:04:08 PM By: Angelina Pih Entered By: Angelina Pih on 11/01/2022 11:25:26 -------------------------------------------------------------------------------- Multi Wound Chart Details Patient Name: Date of Service: [name], [name] 11/01/2022 11:15 A M Medical Record Number: [id number] Patient Account Number: [id number] Date of Birth/Sex: Treating RN: [birth year] (72 y.o. [name] Primary Care Dwayne Bulkley: Daniel Nones Other Clinician: Referring Tameria Patti: Treating Teofilo Lupinacci/Extender: Lois Huxley in  Treatment: 2 Vital Signs Height(in): 63 Pulse(bpm): 71 Weight(lbs): 128 Blood Pressure(mmHg): 147/80 Body Mass Index(BMI): 22.7 Temperature(F): 98 Respiratory Rate(breaths/min): 18 [1:Photos:] [N/A:N/A] Right, Midline Lower Leg N/A N/A Wound Location: Trauma N/A N/A Wounding Event: Trauma, Other N/A N/A Primary Etiology: Chronic sinus problems/congestion N/A N/A Comorbid History: 10/08/2022 N/A N/A Date AcquiredESMAE, HURTS ([id number]) [id number] Page 5 of 9 2 N/A N/A Weeks of Treatment: Open N/A N/A Wound Status: No N/A N/A Wound Recurrence:  Yes N/A N/A Clustered Wound: 3 N/A N/A Clustered Quantity: 5x3x0.2 N/A N/A Measurements L x W x D (cm) 11.781 N/A N/A A (cm) : rea 2.356 N/A N/A Volume (cm) : 52.40% N/A N/A % Reduction in Area: 68.30% N/A N/A % Reduction in Volume: Full Thickness With Exposed Support N/A N/A Classification: Structures Medium N/A N/A Exudate Amount: Serosanguineous N/A N/A Exudate Type: red, brown N/A N/A Exudate Color: Flat and Intact N/A N/A Wound Margin: Medium (34-66%) N/A N/A Granulation Amount: Red N/A N/A Granulation Quality: Medium (34-66%) N/A N/A Necrotic Amount: Fat Layer (Subcutaneous Tissue): Yes N/A N/A Exposed Structures: Muscle: Yes Fascia: No Tendon: No Joint: No Bone: No Small (1-33%) N/A N/A Epithelialization: Treatment Notes Electronic Signature(s) Signed: 11/01/2022 12:46:16 PM By: Angelina Pih Entered By: Angelina Pih on 11/01/2022 12:46:16 -------------------------------------------------------------------------------- Multi-Disciplinary Care Plan Details Patient Name: Date of Service: [name], [name] 11/01/2022 11:15 A M Medical Record Number: [id number] Patient Account Number: [id number] Date of Birth/Sex: Treating RN: 1950-07-30 (72 y.o. [name] Primary Care Aijalon Kirtz: Daniel Nones Other Clinician: Referring Valencia Kassa: Treating Laurissa Cowper/Extender:  Lois Huxley in Treatment: 2 Active Inactive Soft Tissue Infection Nursing Diagnoses: Impaired tissue integrity Potential for infection: soft tissue Goals: Patient will remain free of wound infection Date Initiated: 10/18/2022 Target Resolution Date: 10/18/2022 Goal Status: Active Patient/caregiver will verbalize understanding of or measures to prevent infection and contamination in the home setting Date Initiated: 10/18/2022 Date Inactivated: 11/01/2022 Target Resolution Date: 10/18/2022 Goal Status: Met Signs and symptoms of infection will be recognized early to allow for prompt treatment Date Initiated: 10/18/2022 Date Inactivated: 11/01/2022 Target Resolution Date: 10/18/2022 Goal Status: Met Interventions: KELCIE, BANASZAK ([id number]) [id number] Page 6 of 9 Assess signs and symptoms of infection every visit Notes: Wound/Skin Impairment Nursing Diagnoses: Impaired tissue integrity Goals: Patient/caregiver will verbalize understanding of skin care regimen Date Initiated: 10/18/2022 Date Inactivated: 11/01/2022 Target Resolution Date: 10/18/2022 Goal Status: Met Ulcer/skin breakdown will have a volume reduction of 30% by week 4 Date Initiated: 10/18/2022 Target Resolution Date: 11/08/2022 Goal Status: Active Ulcer/skin breakdown will have a volume reduction of 50% by week 8 Date Initiated: 10/18/2022 Target Resolution Date: 12/06/2022 Goal Status: Active Ulcer/skin breakdown will have a volume reduction of 80% by week 12 Date Initiated: 10/18/2022 Target Resolution Date: 01/03/2023 Goal Status: Active Ulcer/skin breakdown will heal within 14 weeks Date Initiated: 10/18/2022 Target Resolution Date: 01/16/2023 Goal Status: Active Interventions: Assess patient/caregiver ability to obtain necessary supplies Assess patient/caregiver ability to perform ulcer/skin care regimen upon admission and as needed Assess ulceration(s) every  visit Provide education on ulcer and skin care Treatment Activities: Referred to DME Octavis Sheeler for dressing supplies : 10/18/2022 Skin care regimen initiated : 10/18/2022 Notes: Electronic Signature(s) Signed: 11/01/2022 12:47:51 PM By: Angelina Pih Entered By: Angelina Pih on 11/01/2022 12:47:51 -------------------------------------------------------------------------------- Pain Assessment Details Patient Name: Date of Service: [name], [name] 11/01/2022 11:15 A M Medical Record Number: [id number] Patient Account Number: [id number] Date of Birth/Sex: Treating RN: [birth year] (72 y.o. [name] Primary Care Latiesha Harada: Daniel Nones Other Clinician: Referring Adelynne Joerger: Treating Karolynn Infantino/Extender: Lois Huxley in Treatment: 2 Active Problems Location of Pain Severity and Description of Pain Patient Has Paino Yes Site Locations Rate the pain. TYQUESHA, MCCONATHY ([id number]) [phone].pdf Page 7 of 9 Rate the pain. Current Pain Level: 4 Pain Management and Medication Current Pain Management: Electronic Signature(s) Signed: 11/01/2022 1:04:08 PM By: Angelina Pih Entered By: Angelina Pih on 11/01/2022 11:16:42 -------------------------------------------------------------------------------- Patient/Caregiver Education  Details Patient Name: Date of Service: [name], [place] 6/28/2024andnbsp11:15 A M Medical Record Number: [id number] Patient Account Number: [id number] Date of Birth/Gender: Treating RN: [birth year] (72 y.o. [name] Primary Care Physician: Daniel Nones Other Clinician: Referring Physician: Treating Physician/Extender: Lois Huxley in Treatment: 2 Education Assessment Education Provided To: Patient Education Topics Provided Wound/Skin Impairment: Handouts: Caring for Your Ulcer Methods: Explain/Verbal Responses: State content correctly Electronic Signature(s) Signed: 11/01/2022  1:04:08 PM By: Angelina Pih Entered By: Angelina Pih on 11/01/2022 12:48:01 [place] ([id number]) [id number] Page 8 of 9 -------------------------------------------------------------------------------- Wound Assessment Details Patient Name: Date of Service: [name], [place] [name] 11/01/2022 11:15 A M Medical Record Number: [id number] Patient Account Number: [id number] Date of Birth/Sex: Treating RN: [birth year] (72 y.o. [name] Primary Care Olin Gurski: Daniel Nones Other Clinician: Referring Balen Woolum: Treating Cheikh Bramble/Extender: [name] in Treatment: 2 Wound Status Wound Number: 1 Primary Etiology: Trauma, Other Wound Location: Right, Midline Lower Leg Wound Status: Open Wounding Event: Trauma Comorbid History: Chronic sinus problems/congestion Date Acquired: 10/08/2022 Weeks Of Treatment: 2 Clustered Wound: Yes Photos Wound Measurements Length: (cm) Width: (cm) Depth: (cm) Clustered Quantity: Area: (cm) Volume: (cm) 5 % Reduction in Area: 52.4% 3 % Reduction in Volume: 68.3% 0.2 Epithelialization: Small (1-33%) 3 Tunneling: No 11.781 Undermining: No 2.356 Wound Description Classification: Full Thickness With Exposed Suppo Wound Margin: Flat and Intact Exudate Amount: Medium Exudate Type: Serosanguineous Exudate Color: red, brown rt Structures Foul Odor After Cleansing: No Slough/Fibrino Yes Wound Bed Granulation Amount: Medium (34-66%) Exposed Structure Granulation Quality: Red Fascia Exposed: No Necrotic Amount: Medium (34-66%) Fat Layer (Subcutaneous Tissue) Exposed: Yes Necrotic Quality: Adherent Slough Tendon Exposed: No Muscle Exposed: Yes Necrosis of Muscle: No Joint Exposed: No Bone Exposed: No Treatment Notes Wound #1 (Lower Leg) Wound Laterality: Right, Midline Cleanser Vashe 5.8 (oz) AYSIAH, CANAVAN ([id number]) [id number] Page 9 of 9 Discharge Instruction:  Use vashe 5.8 (oz) as directed Peri-Wound Care Topical Mupirocin Ointment Discharge Instruction: Apply as directed by Larsen Dungan. Primary Dressing Hydrofera Blue Ready Transfer Foam, 4x5 (in/in) Discharge Instruction: Apply Hydrofera Blue Ready to wound bed as directed (Border) Zetuvit Plus Silicone Border Dressing 5x5 (in/in) Secondary Dressing Secured With Tubigrip Size C, 2.75x10 (in/yd) Discharge Instruction: Apply 3 Tubigrip C 3-finger-widths below knee to base of toes to secure dressing and/or for swelling. Compression Wrap Compression Stockings Add-Ons Electronic Signature(s) Signed: 11/01/2022 1:04:08 PM By: Angelina Pih Entered By: Angelina Pih on 11/01/2022 11:25:05 -------------------------------------------------------------------------------- Vitals Details Patient Name: Date of Service: [name], [name] 11/01/2022 11:15 A M Medical Record Number: [id number] Patient Account Number: [id number] Date of Birth/Sex: Treating RN: 05/07/1950 (72 y.o. [name] Primary Care Shalayah Beagley: Daniel Nones Other Clinician: Referring Niyam Bisping: Treating Lexus Shampine/Extender: Lois Huxley in Treatment: 2 Vital Signs Time Taken: 11:16 Temperature (F): 98 Height (in): 63 Pulse (bpm): 71 Weight (lbs): 128 Respiratory Rate (breaths/min): 18 Body Mass Index (BMI): 22.7 Blood Pressure (mmHg): 147/80 Reference Range: 80 - 120 mg / dl Electronic Signature(s) Signed: 11/01/2022 1:04:08 PM By: Angelina Pih Entered By: Angelina Pih on 11/01/2022 11:16:33

## 2022-11-01 NOTE — Progress Notes (Addendum)
GRIER, WINSTEAD ([id number]) [id number] Page 1 of 6 Visit Report for 11/01/2022 Chief Complaint Document Details Patient Name: Date of Service: [name], Meredith Dakota [name] 11/01/2022 11:15 A M Medical Record Number: [id number] Patient Account Number: [id number] Date of Birth/Sex: Treating RN: Aug 05, 1950 (72 y.o. [name] Primary Care Provider: Daniel Nones Other Clinician: Referring Provider: Treating Provider/Extender: Lois Huxley in Treatment: 2 Information Obtained from: Patient Chief Complaint Right LE ulcer Electronic Signature(s) Signed: 11/01/2022 11:30:06 AM By: Allen Derry PA-C Entered By: Allen Derry on 11/01/2022 11:30:06 -------------------------------------------------------------------------------- HPI Details Patient Name: Date of Service: [name], Meredith [name] 11/01/2022 11:15 A M Medical Record Number: [id number] Patient Account Number: [id number] Date of Birth/Sex: Treating RN: Dec 19, 1950 (72 y.o. [name] Primary Care Provider: Daniel Nones Other Clinician: Referring Provider: Treating Provider/Extender: Lois Huxley in Treatment: 2 History of Present Illness HPI Description: 10-18-2022 patient presents today for initial inspection here in our clinic concerning issues that she has been having with a wound on her right anterior lower extremity. Unfortunately the patient had an issue where she was setting up a table and the table was leg had gotten stuck. When she was trying to dislodge it came loose too quickly and she ended up actually falling and this caused a significant issue with a skin tear on the right shin among other things including hitting her head and hurting her neck as well. Fortunately I do not see any signs overall of infection right now but there is definitely some need for sharp debridement in regard to the necrotic tissue at this point on the right anterior shin. This injury occurred  on 10-08-2022 and this is a Teacher, adult education. claim. Patient has an elevated blood pressure reading today but no diagnosis of hypertension she has whitecoat syndrome otherwise she is a fairly healthy individual as far as wound healing is concerned. 6/19; patient was admitted to our clinic last week on 6/14 for traumatic leg wound. Upon having her dressing changed today it was noted she was having more serous drainage from the Opening in the center Of the wound and patient was concerned for infection and came in to be evaluated. Currently she denies fever/chills, increased warmth or erythema to the surrounding skin or Purulent drainage. She has been using collagen and Xeroform to the wound beds. 10-25-2022 upon evaluation today patient's wound actually appears to be doing much better at this time. Fortunately I do not see any signs of active infection locally or systemically which is good news that she actually seems to be better than apparently she was on Wednesday. Nonetheless she still has the center area that keeps collecting fluid and sealing up and then subsequently draining I think if we keep her swelling under control this will alleviate a lot of this issue in general. I discussed that with her today. [name] ([id number]) [id number] Page 2 of 6 11-01-2022 upon evaluation today patient appears to be doing well currently in regard to her wound. She has been tolerating the dressing changes without complication. Fortunately there does not appear to be any signs of active infection locally nor systemically which is great news. I am actually extremely pleased with where things stand at this point. Electronic Signature(s) Signed: 11/01/2022 11:57:53 AM By: Allen Derry PA-C Entered By: Allen Derry on 11/01/2022 11:57:53 -------------------------------------------------------------------------------- Physical Exam Details Patient Name: Date of Service: [name], Meredith Dakota [name]  11/01/2022 11:15 A M Medical Record Number: [id number] Patient Account  Number: [id number] Date of Birth/Sex: Treating RN: Mar 01, 1951 (72 y.o. [name] Primary Care Provider: Daniel Nones Other Clinician: Referring Provider: Treating Provider/Extender: [name] in Treatment: 2 Constitutional Well-nourished and well-hydrated in no acute distress. Respiratory normal breathing without difficulty. Psychiatric this patient is able to make decisions and demonstrates good insight into disease process. Alert and Oriented x 3. pleasant and cooperative. Notes Upon inspection patient's wound bed showed signs of good granulation and epithelization she does not even need any debridement today I just cleaned this with saline and gauze and post saline gauze debridement this looks much better. Electronic Signature(s) Signed: 11/01/2022 11:58:06 AM By: Allen Derry PA-C Entered By: Allen Derry on 11/01/2022 11:58:06 -------------------------------------------------------------------------------- Physician Orders Details Patient Name: Date of Service: [name], Meredith [name] 11/01/2022 11:15 A M Medical Record Number: [id number] Patient Account Number: [id number] Date of Birth/Sex: Treating RN: October 29, 1950 (72 y.o. [name] Primary Care Provider: Daniel Nones Other Clinician: Referring Provider: Treating Provider/Extender: Lois Huxley in Treatment: 2 Verbal / Phone Orders: No Diagnosis Coding [name] ([id number]) [id number] Page 3 of 6 ICD-10 Coding Code Description [phone] Laceration without foreign body, right lower leg, initial encounter L97.812 Non-pressure chronic ulcer of other part of right lower leg with fat layer exposed R03.0 Elevated blood-pressure reading, without diagnosis of hypertension Follow-up Appointments Return Appointment in 1 week. Nurse Visit as needed Bathing/ Shower/ Hygiene May shower  with wound dressing protected with water repellent cover or cast protector. Anesthetic (Use 'Patient Medications' Section for Anesthetic Order Entry) Lidocaine applied to wound bed Additional Orders / Instructions Follow Nutritious Diet and Increase Protein Intake Medications-Please add to medication list. P.O. Antibiotics Topical A ntibiotic Wound Treatment Wound #1 - Lower Leg Wound Laterality: Right, Midline Cleanser: Vashe 5.8 (oz) 1 x Per Day/30 Days Discharge Instructions: Use vashe 5.8 (oz) as directed Topical: Mupirocin Ointment 1 x Per Day/30 Days Discharge Instructions: Apply as directed by provider. Prim Dressing: Hydrofera Blue Ready Transfer Foam, 4x5 (in/in) 1 x Per Day/30 Days ary Discharge Instructions: Apply Hydrofera Blue Ready to wound bed as directed Prim Dressing: (Border) Zetuvit Plus Silicone Border Dressing 5x5 (in/in) (Generic) 1 x Per Day/30 Days ary Secured With: Tubigrip Size C, 2.75x10 (in/yd) 1 x Per Day/30 Days Discharge Instructions: Apply 3 Tubigrip C 3-finger-widths below knee to base of toes to secure dressing and/or for swelling. Electronic Signature(s) Signed: 11/01/2022 12:46:45 PM By: Angelina Pih Signed: 11/01/2022 1:40:48 PM By: Allen Derry PA-C Entered By: Angelina Pih on 11/01/2022 12:46:45 -------------------------------------------------------------------------------- Problem List Details Patient Name: Date of Service: [name], Meredith [name] 11/01/2022 11:15 A M Medical Record Number: [id number] Patient Account Number: [id number] Date of Birth/Sex: Treating RN: [birth year] (72 y.o. [name] Primary Care Provider: Daniel Nones Other Clinician: Referring Provider: Treating Provider/Extender: Lois Huxley in Treatment: 2 Active Problems ICD-10 Encounter Code Description Active Date MDM Diagnosis S81.811A Laceration without foreign body, right lower leg, initial encounter 10/18/2022 No Yes [name]  ([id number]) [id number] Page 4 of 6 [phone] Non-pressure chronic ulcer of other part of right lower leg with fat layer 10/18/2022 No Yes exposed R03.0 Elevated blood-pressure reading, without diagnosis of hypertension 10/18/2022 No Yes Inactive Problems Resolved Problems Electronic Signature(s) Signed: 11/01/2022 11:29:46 AM By: Allen Derry PA-C Entered By: Allen Derry on 11/01/2022 11:29:45 -------------------------------------------------------------------------------- Progress Note Details Patient Name: Date of Service: [name], Meredith [name] 11/01/2022 11:15 A M Medical Record Number: [id number] Patient Account Number:  [id number] Date of Birth/Sex: Treating RN: 1951/02/03 (72 y.o. [name] Primary Care Provider: Daniel Nones Other Clinician: Referring Provider: Treating Provider/Extender: Lois Huxley in Treatment: 2 Subjective Chief Complaint Information obtained from Patient Right LE ulcer History of Present Illness (HPI) 10-18-2022 patient presents today for initial inspection here in our clinic concerning issues that she has been having with a wound on her right anterior lower extremity. Unfortunately the patient had an issue where she was setting up a table and the table was leg had gotten stuck. When she was trying to dislodge it came loose too quickly and she ended up actually falling and this caused a significant issue with a skin tear on the right shin among other things including hitting her head and hurting her neck as well. Fortunately I do not see any signs overall of infection right now but there is definitely some need for sharp debridement in regard to the necrotic tissue at this point on the right anterior shin. 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 claim. Patient has an elevated blood pressure reading today but no diagnosis of hypertension she has whitecoat syndrome otherwise she is a fairly  healthy individual as far as wound healing is concerned. 6/19; patient was admitted to our clinic last week on 6/14 for traumatic leg wound. Upon having her dressing changed today it was noted she was having more serous drainage from the Opening in the center Of the wound and patient was concerned for infection and came in to be evaluated. Currently she denies fever/chills, increased warmth or erythema to the surrounding skin or Purulent drainage. She has been using collagen and Xeroform to the wound beds. 10-25-2022 upon evaluation today patient's wound actually appears to be doing much better at this time. Fortunately I do not see any signs of active infection locally or systemically which is good news that she actually seems to be better than apparently she was on Wednesday. Nonetheless she still has the center area that keeps collecting fluid and sealing up and then subsequently draining I think if we keep her swelling under control this will alleviate a lot of this issue in general. I discussed that with her today. 11-01-2022 upon evaluation today patient appears to be doing well currently in regard to her wound. She has been tolerating the dressing changes without complication. Fortunately there does not appear to be any signs of active infection locally nor systemically which is great news. I am actually extremely pleased with where things stand at this point. [name] ([id number]) [id number] Page 5 of 6 Objective Constitutional Well-nourished and well-hydrated in no acute distress. Vitals Time Taken: 11:16 AM, Height: 63 in, Weight: 128 lbs, BMI: 22.7, Temperature: 98 F, Pulse: 71 bpm, Respiratory Rate: 18 breaths/min, Blood Pressure: 147/80 mmHg. Respiratory normal breathing without difficulty. Psychiatric this patient is able to make decisions and demonstrates good insight into disease process. Alert and Oriented x 3. pleasant and cooperative. General  Notes: Upon inspection patient's wound bed showed signs of good granulation and epithelization she does not even need any debridement today I just cleaned this with saline and gauze and post saline gauze debridement this looks much better. Integumentary (Hair, Skin) Wound #1 status is Open. Original cause of wound was Trauma. The date acquired was: 10/08/2022. The wound has been in treatment 2 weeks. The wound is located on the Right,Midline Lower Leg. The wound measures 5cm length x 3cm width x 0.2cm depth; 11.781cm^2  area and 2.356cm^3 volume. There is muscle and Fat Layer (Subcutaneous Tissue) exposed. There is no tunneling or undermining noted. There is a medium amount of serosanguineous drainage noted. The wound margin is flat and intact. There is medium (34-66%) red granulation within the wound bed. There is a medium (34-66%) amount of necrotic tissue within the wound bed including Adherent Slough. Assessment Active Problems ICD-10 Laceration without foreign body, right lower leg, initial encounter Non-pressure chronic ulcer of other part of right lower leg with fat layer exposed Elevated blood-pressure reading, without diagnosis of hypertension Plan 1. I would recommend that we have the patient continue to monitor for any evidence of infection or worsening. In general I do believe that we are making good headway towards closure and I think she is doing very well here. I do not see any signs of active infection locally nor systemically which is great news. 2. I am also can recommend that she should continue to monitor for any signs of infection obviously if anything changes she knows contact the office and let me know. Based on what I am seeing however I think she is doing quite well. 3 I would recommend she continue to elevate her legs much as possible more that she can do the better. We will see patient back for reevaluation in 1 week here in the clinic. If anything worsens or changes  patient will contact our office for additional recommendations. Electronic Signature(s) Signed: 11/01/2022 11:59:58 AM By: Allen Derry PA-C Entered By: Allen Derry on 11/01/2022 11:59:58 -------------------------------------------------------------------------------- SuperBill Details Patient Name: Date of Service: [name], Meredith [name] 11/01/2022 Medical Record Number: [id number] Patient Account Number: [id number] Date of Birth/Sex: Treating RN: 04-Apr-1951 (72 y.o. [name] Primary Care Provider: Daniel Nones Other Clinician: Referring Provider: Treating Provider/Extender: Lois Huxley in Treatment: 2 [name] ([id number]) [id number] Page 6 of 6 Diagnosis Coding ICD-10 Codes Code Description [phone] Laceration without foreign body, right lower leg, initial encounter L97.812 Non-pressure chronic ulcer of other part of right lower leg with fat layer exposed R03.0 Elevated blood-pressure reading, without diagnosis of hypertension Facility Procedures : CPT4 Code: [id number] Description: 99213 - WOUND CARE VISIT-LEV 3 EST PT Modifier: Quantity: 1 Physician Procedures : CPT4 Code Description Modifier [id number] - WC PHYS LEVEL 3 - EST PT ICD-10 Diagnosis Description S81.811A Laceration without foreign body, right lower leg, initial encounter L97.812 Non-pressure chronic ulcer of other part of right lower leg with  fat layer exposed R03.0 Elevated blood-pressure reading, without diagnosis of hypertension Quantity: 1 Electronic Signature(s) Signed: 11/01/2022 12:47:21 PM By: Angelina Pih Signed: 11/01/2022 1:40:48 PM By: Allen Derry PA-C Previous Signature: 11/01/2022 12:00:12 PM Version By: Allen Derry PA-C Entered By: Angelina Pih on 11/01/2022 12:47:21

## 2022-11-08 ENCOUNTER — Encounter: Payer: PRIVATE HEALTH INSURANCE | Attending: Physician Assistant | Admitting: Physician Assistant

## 2022-11-08 ENCOUNTER — Other Ambulatory Visit: Payer: Self-pay

## 2022-11-08 DIAGNOSIS — S81811A Laceration without foreign body, right lower leg, initial encounter: Secondary | ICD-10-CM | POA: Insufficient documentation

## 2022-11-08 DIAGNOSIS — L97812 Non-pressure chronic ulcer of other part of right lower leg with fat layer exposed: Secondary | ICD-10-CM | POA: Diagnosis not present

## 2022-11-08 DIAGNOSIS — W19XXXA Unspecified fall, initial encounter: Secondary | ICD-10-CM | POA: Diagnosis not present

## 2022-11-08 DIAGNOSIS — R030 Elevated blood-pressure reading, without diagnosis of hypertension: Secondary | ICD-10-CM | POA: Diagnosis not present

## 2022-11-08 NOTE — Progress Notes (Addendum)
[name], [name] ([id number]) [id number] Page 1 of 7 Visit Report for 11/08/2022 Chief Complaint Document Details Patient Name: Date of Service: [name], [place] Meredith J. 11/08/2022 9:30 A M Medical Record Number: [id number] Patient Account Number: [id number] Date of Birth/Sex: Treating RN: 07-Feb-1951 (72 y.o. Esmeralda Links Primary Care Provider: Daniel Nones Other Clinician: Betha Loa Referring Provider: Treating Provider/Extender: Lois Huxley in Treatment: 3 Information Obtained from: Patient Chief Complaint Right LE ulcer Electronic Signature(s) Signed: 11/08/2022 9:32:41 AM By: Allen Derry PA-C Entered By: Allen Derry on 11/08/2022 09:32:41 -------------------------------------------------------------------------------- Debridement Details Patient Name: Date of Service: [name], Meredith Meredith J. 11/08/2022 9:30 A M Medical Record Number: [id number] Patient Account Number: [id number] Date of Birth/Sex: Treating RN: 22-Nov-1950 (72 y.o. Esmeralda Links Primary Care Provider: Daniel Nones Other Clinician: Betha Loa Referring Provider: Treating Provider/Extender: Lois Huxley in Treatment: 3 Debridement Performed for Assessment: Wound #1 Right,Midline Lower Leg Performed By: Physician Allen Derry, PA-C Debridement Type: Debridement Level of Consciousness (Pre-procedure): Awake and Alert Pre-procedure Verification/Time Out Yes - 10:10 Taken: Start Time: 10:10 Percent of Wound Bed Debrided: 100% T Area Debrided (cm): otal 0.16 Tissue and other material debrided: Viable, Non-Viable, Slough, Subcutaneous, Slough Level: Skin/Subcutaneous Tissue Debridement Description: Excisional Instrument: Curette Bleeding: Minimum Hemostasis Achieved: Pressure Response to Treatment: Procedure was tolerated well Level of Consciousness (Post- Awake and Alert procedure): [name], [name] ([id number])  [id number] Page 2 of 7 Post Debridement Measurements of Total Wound Length: (cm) 0.7 Width: (cm) 0.3 Depth: (cm) 0.2 Volume: (cm) 0.033 Character of Wound/Ulcer Post Debridement: Improved Post Procedure Diagnosis Same as Pre-procedure Electronic Signature(s) Signed: 11/08/2022 12:39:59 PM By: Betha Loa Signed: 11/08/2022 12:56:26 PM By: Allen Derry PA-C Signed: 11/08/2022 1:30:41 PM By: Angelina Pih Entered By: Betha Loa on 11/08/2022 10:12:49 -------------------------------------------------------------------------------- HPI Details Patient Name: Date of Service: [name], Meredith Meredith J. 11/08/2022 9:30 A M Medical Record Number: [id number] Patient Account Number: [id number] Date of Birth/Sex: Treating RN: 09-20-1950 (72 y.o. Esmeralda Links Primary Care Provider: Daniel Nones Other Clinician: Betha Loa Referring Provider: Treating Provider/Extender: Lois Huxley in Treatment: 3 History of Present Illness HPI Description: 10-18-2022 patient presents today for initial inspection here in our clinic concerning issues that she has been having with a wound on her right anterior lower extremity. Unfortunately the patient had an issue where she was setting up a table and the table was leg had gotten stuck. When she was trying to dislodge it came loose too quickly and she ended up actually falling and this caused a significant issue with a skin tear on the right shin among other things including hitting her head and hurting her neck as well. Fortunately I do not see any signs overall of infection right now but there is definitely some need for sharp debridement in regard to the necrotic tissue at this point on the right anterior shin. 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 claim. Patient has an elevated blood pressure reading today but no diagnosis of hypertension she has whitecoat syndrome otherwise she is a fairly  healthy individual as far as wound healing is concerned. 6/19; patient was admitted to our clinic last week on 6/14 for traumatic leg wound. Upon having her dressing changed today it was noted she was having more serous drainage from the Opening in the center Of the wound and patient was concerned for infection and came in to be evaluated. Currently she denies fever/chills, increased  warmth or erythema to the surrounding skin or Purulent drainage. She has been using collagen and Xeroform to the wound beds. 10-25-2022 upon evaluation today patient's wound actually appears to be doing much better at this time. Fortunately I do not see any signs of active infection locally or systemically which is good news that she actually seems to be better than apparently she was on Wednesday. Nonetheless she still has the center area that keeps collecting fluid and sealing up and then subsequently draining I think if we keep her swelling under control this will alleviate a lot of this issue in general. I discussed that with her today. 11-01-2022 upon evaluation today patient appears to be doing well currently in regard to her wound. She has been tolerating the dressing changes without complication. Fortunately there does not appear to be any signs of active infection locally nor systemically which is great news. I am actually extremely pleased with where things stand at this point. 11-08-2022 upon evaluation today patient appears to be doing well currently in regard to her wound. She has been tolerating the dressing changes without complication. Fortunately there does not appear to be any signs of active infection locally nor systemically at this time which is great news. No fevers, chills, nausea, vomiting, or diarrhea. Electronic Signature(s) Signed: 11/08/2022 10:17:05 AM By: Allen Derry PA-C Entered By: Allen Derry on 11/08/2022 10:17:04 [place] ([id number]) [id number] Page 3 of  7 -------------------------------------------------------------------------------- Physical Exam Details Patient Name: Date of Service: [name], [place] Meredith J. 11/08/2022 9:30 A M Medical Record Number: [id number] Patient Account Number: [id number] Date of Birth/Sex: Treating RN: 1950-05-09 (72 y.o. Esmeralda Links Primary Care Provider: Daniel Nones Other Clinician: Betha Loa Referring Provider: Treating Provider/Extender: [name] in Treatment: 3 Constitutional Well-nourished and well-hydrated in no acute distress. Respiratory normal breathing without difficulty. Psychiatric this patient is able to make decisions and demonstrates good insight into disease process. Alert and Oriented x 3. pleasant and cooperative. Notes Upon inspection patient's wound bed actually showed signs of good granulation epithelization at this point. Fortunately I think that she is making excellent progress towards closure there is some need for sharp debridement today I did perform debridement clearway necrotic debris she tolerated that without complication and postdebridement the wound bed appears to be doing significantly better. Electronic Signature(s) Signed: 11/08/2022 10:17:16 AM By: Allen Derry PA-C Entered By: Allen Derry on 11/08/2022 10:17:16 -------------------------------------------------------------------------------- Physician Orders Details Patient Name: Date of Service: [name], Meredith Meredith J. 11/08/2022 9:30 A M Medical Record Number: [id number] Patient Account Number: [id number] Date of Birth/Sex: Treating RN: June 24, 1950 (72 y.o. Esmeralda Links Primary Care Provider: Daniel Nones Other Clinician: Betha Loa Referring Provider: Treating Provider/Extender: Lois Huxley in Treatment: 3 Verbal / Phone Orders: Yes Clinician: Angelina Pih Read Back and Verified: Yes Diagnosis Coding ICD-10 Coding Code Description [phone] Laceration without  foreign body, right lower leg, initial encounter L97.812 Non-pressure chronic ulcer of other part of right lower leg with fat layer exposed R03.0 Elevated blood-pressure reading, without diagnosis of hypertension Follow-up Appointments Return Appointment in 1 week. Nurse Visit as needed ARLEATHA, WINCH ([id number]) [id number] Page 4 of 7 Bathing/ Shower/ Hygiene May shower with wound dressing protected with water repellent cover or cast protector. Anesthetic (Use 'Patient Medications' Section for Anesthetic Order Entry) Lidocaine applied to wound bed Additional Orders / Instructions Follow Nutritious Diet and Increase Protein Intake Medications-Please add to medication list. ntibiotics - finish antibiotic P.O. A Topical  A ntibiotic Wound Treatment Wound #1 - Lower Leg Wound Laterality: Right, Midline Cleanser: Vashe 5.8 (oz) 1 x Per Day/30 Days Discharge Instructions: Use vashe 5.8 (oz) as directed Topical: Mupirocin Ointment 1 x Per Day/30 Days Discharge Instructions: Apply as directed by provider. Prim Dressing: Hydrofera Blue Ready Transfer Foam, 4x5 (in/in) 1 x Per Day/30 Days ary Discharge Instructions: Apply Hydrofera Blue Ready to wound bed as directed Prim Dressing: (Border) Zetuvit Plus Silicone Border Dressing 5x5 (in/in) (Generic) 1 x Per Day/30 Days ary Secured With: Tubigrip Size C, 2.75x10 (in/yd) 1 x Per Day/30 Days Discharge Instructions: Apply 3 Tubigrip C 3-finger-widths below knee to base of toes to secure dressing and/or for swelling. Electronic Signature(s) Signed: 11/08/2022 12:39:59 PM By: Betha Loa Signed: 11/08/2022 12:56:26 PM By: Allen Derry PA-C Entered By: Betha Loa on 11/08/2022 10:13:21 -------------------------------------------------------------------------------- Problem List Details Patient Name: Date of Service: [name], Meredith Meredith J. 11/08/2022 9:30 A M Medical Record Number: [id number] Patient Account Number:  [id number] Date of Birth/Sex: Treating RN: 1951-01-12 (72 y.o. Esmeralda Links Primary Care Provider: Daniel Nones Other Clinician: Betha Loa Referring Provider: Treating Provider/Extender: Lois Huxley in Treatment: 3 Active Problems ICD-10 Encounter Code Description Active Date MDM Diagnosis S81.811A Laceration without foreign body, right lower leg, initial encounter 10/18/2022 No Yes L97.812 Non-pressure chronic ulcer of other part of right lower leg with fat layer 10/18/2022 No Yes exposed R03.0 Elevated blood-pressure reading, without diagnosis of hypertension 10/18/2022 No Yes [name], [name] ([id number]) [id number] Page 5 of 7 Inactive Problems Resolved Problems Electronic Signature(s) Signed: 11/08/2022 9:32:38 AM By: Allen Derry PA-C Entered By: Allen Derry on 11/08/2022 09:32:38 -------------------------------------------------------------------------------- Progress Note Details Patient Name: Date of Service: [name], Meredith Meredith J. 11/08/2022 9:30 A M Medical Record Number: [id number] Patient Account Number: [id number] Date of Birth/Sex: Treating RN: November 13, 1950 (72 y.o. Esmeralda Links Primary Care Provider: Daniel Nones Other Clinician: Betha Loa Referring Provider: Treating Provider/Extender: Lois Huxley in Treatment: 3 Subjective Chief Complaint Information obtained from Patient Right LE ulcer History of Present Illness (HPI) 10-18-2022 patient presents today for initial inspection here in our clinic concerning issues that she has been having with a wound on her right anterior lower extremity. Unfortunately the patient had an issue where she was setting up a table and the table was leg had gotten stuck. When she was trying to dislodge it came loose too quickly and she ended up actually falling and this caused a significant issue with a skin tear on the right shin among other things  including hitting her head and hurting her neck as well. Fortunately I do not see any signs overall of infection right now but there is definitely some need for sharp debridement in regard to the necrotic tissue at this point on the right anterior shin. 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 claim. Patient has an elevated blood pressure reading today but no diagnosis of hypertension she has whitecoat syndrome otherwise she is a fairly healthy individual as far as wound healing is concerned. 6/19; patient was admitted to our clinic last week on 6/14 for traumatic leg wound. Upon having her dressing changed today it was noted she was having more serous drainage from the Opening in the center Of the wound and patient was concerned for infection and came in to be evaluated. Currently she denies fever/chills, increased warmth or erythema to the surrounding skin or Purulent drainage. She has been using collagen and  Xeroform to the wound beds. 10-25-2022 upon evaluation today patient's wound actually appears to be doing much better at this time. Fortunately I do not see any signs of active infection locally or systemically which is good news that she actually seems to be better than apparently she was on Wednesday. Nonetheless she still has the center area that keeps collecting fluid and sealing up and then subsequently draining I think if we keep her swelling under control this will alleviate a lot of this issue in general. I discussed that with her today. 11-01-2022 upon evaluation today patient appears to be doing well currently in regard to her wound. She has been tolerating the dressing changes without complication. Fortunately there does not appear to be any signs of active infection locally nor systemically which is great news. I am actually extremely pleased with where things stand at this point. 11-08-2022 upon evaluation today patient appears to be doing well currently in regard  to her wound. She has been tolerating the dressing changes without complication. Fortunately there does not appear to be any signs of active infection locally nor systemically at this time which is great news. No fevers, chills, nausea, vomiting, or diarrhea. Objective Constitutional Well-nourished and well-hydrated in no acute distress. Vitals Time Taken: 9:30 AM, Height: 63 in, Weight: 128 lbs, BMI: 22.7, Temperature: 97.9 F, Pulse: 79 bpm, Respiratory Rate: 16 breaths/min, Blood Pressure: AVRIANA, RINKUS ([id number]) [id number] Page 6 of 7 147/90 mmHg. Respiratory normal breathing without difficulty. Psychiatric this patient is able to make decisions and demonstrates good insight into disease process. Alert and Oriented x 3. pleasant and cooperative. General Notes: Upon inspection patient's wound bed actually showed signs of good granulation epithelization at this point. Fortunately I think that she is making excellent progress towards closure there is some need for sharp debridement today I did perform debridement clearway necrotic debris she tolerated that without complication and postdebridement the wound bed appears to be doing significantly better. Integumentary (Hair, Skin) Wound #1 status is Open. Original cause of wound was Trauma. The date acquired was: 10/08/2022. The wound has been in treatment 3 weeks. The wound is located on the Right,Midline Lower Leg. The wound measures 0.7cm length x 0.3cm width x 0.2cm depth; 0.165cm^2 area and 0.033cm^3 volume. There is muscle and Fat Layer (Subcutaneous Tissue) exposed. There is a medium amount of serosanguineous drainage noted. The wound margin is flat and intact. There is medium (34-66%) red granulation within the wound bed. There is a medium (34-66%) amount of necrotic tissue within the wound bed including Adherent Slough. Assessment Active Problems ICD-10 Laceration without foreign body, right lower leg,  initial encounter Non-pressure chronic ulcer of other part of right lower leg with fat layer exposed Elevated blood-pressure reading, without diagnosis of hypertension Procedures Wound #1 Pre-procedure diagnosis of Wound #1 is a Trauma, Other located on the Right,Midline Lower Leg . There was a Excisional Skin/Subcutaneous Tissue Debridement with a total area of 0.16 sq cm performed by Allen Derry, PA-C. With the following instrument(s): Curette to remove Viable and Non-Viable tissue/material. Material removed includes Subcutaneous Tissue and Slough and. A time out was conducted at 10:10, prior to the start of the procedure. A Minimum amount of bleeding was controlled with Pressure. The procedure was tolerated well. Post Debridement Measurements: 0.7cm length x 0.3cm width x 0.2cm depth; 0.033cm^3 volume. Character of Wound/Ulcer Post Debridement is improved. Post procedure Diagnosis Wound #1: Same as Pre-Procedure Plan Follow-up Appointments: Return Appointment in 1 week. Nurse Visit as  needed Bathing/ Shower/ Hygiene: May shower with wound dressing protected with water repellent cover or cast protector. Anesthetic (Use 'Patient Medications' Section for Anesthetic Order Entry): Lidocaine applied to wound bed Additional Orders / Instructions: Follow Nutritious Diet and Increase Protein Intake Medications-Please add to medication list.: P.O. Antibiotics - finish antibiotic Topical Antibiotic WOUND #1: - Lower Leg Wound Laterality: Right, Midline Cleanser: Vashe 5.8 (oz) 1 x Per Day/30 Days Discharge Instructions: Use vashe 5.8 (oz) as directed Topical: Mupirocin Ointment 1 x Per Day/30 Days Discharge Instructions: Apply as directed by provider. Prim Dressing: Hydrofera Blue Ready Transfer Foam, 4x5 (in/in) 1 x Per Day/30 Days ary Discharge Instructions: Apply Hydrofera Blue Ready to wound bed as directed Prim Dressing: (Border) Zetuvit Plus Silicone Border Dressing 5x5 (in/in)  (Generic) 1 x Per Day/30 Days ary Secured With: Tubigrip Size C, 2.75x10 (in/yd) 1 x Per Day/30 Days Discharge Instructions: Apply 3 Tubigrip C 3-finger-widths below knee to base of toes to secure dressing and/or for swelling. 1. I would recommend that we have the patient continue to monitor for any signs of infection or worsening. Based on what I am seeing I do think that patient is making good progress here. 2. I am also can recommend we continue with the [facility] which I think is doing excellent. 3. And also can recommend that we continue with the Tubigrip which I think is doing a really good job. We will see patient back for reevaluation in 1 week here in the clinic. If anything worsens or changes patient will contact our office for additional recommendations. [name], [name] ([id number]) [id number] Page 7 of 7 Electronic Signature(s) Signed: 11/08/2022 10:17:39 AM By: Allen Derry PA-C Entered By: Allen Derry on 11/08/2022 10:17:39 -------------------------------------------------------------------------------- SuperBill Details Patient Name: Date of Service: [name], Meredith Meredith J. 11/08/2022 Medical Record Number: [id number] Patient Account Number: [id number] Date of Birth/Sex: Treating RN: 04/22/1951 (72 y.o. Esmeralda Links Primary Care Provider: Daniel Nones Other Clinician: Betha Loa Referring Provider: Treating Provider/Extender: Lois Huxley in Treatment: 3 Diagnosis Coding ICD-10 Codes Code Description [phone] Laceration without foreign body, right lower leg, initial encounter L97.812 Non-pressure chronic ulcer of other part of right lower leg with fat layer exposed R03.0 Elevated blood-pressure reading, without diagnosis of hypertension Facility Procedures : CPT4 Code: [id number] Description: 11042 - DEB SUBQ TISSUE 20 SQ CM/< ICD-10 Diagnosis Description L97.812 Non-pressure chronic ulcer of other part of right lower leg  with fat layer exp Modifier: osed Quantity: 1 Physician Procedures : CPT4 Code Description Modifier [id number] 11042 - WC PHYS SUBQ TISS 20 SQ CM ICD-10 Diagnosis Description L97.812 Non-pressure chronic ulcer of other part of right lower leg with fat layer exposed Quantity: 1 Electronic Signature(s) Signed: 11/08/2022 10:19:57 AM By: Allen Derry PA-C Entered By: Allen Derry on 11/08/2022 10:19:57

## 2022-11-08 NOTE — Progress Notes (Signed)
ANGE, LABLANC ([id number]) [phone].pdf Page 1 of 7 Visit Report for 11/08/2022 Arrival Information Details Patient Name: Date of Service: [name], [place] [name] 11/08/2022 9:30 A M Medical Record Number: [id number] Patient Account Number: [id number] Date of Birth/Sex: Treating RN: Apr 13, 1951 (72 y.o. Esmeralda Links Primary Care Ilynn Stauffer: Daniel Nones Other Clinician: Betha Loa Referring Xxavier Noon: Treating Biance Moncrief/Extender: Lois Huxley in Treatment: 3 Visit Information History Since Last Visit All ordered tests and consults were completed: No Patient Arrived: Ambulatory Added or deleted any medications: No Arrival Time: 09:28 Any new allergies or adverse reactions: No Transfer Assistance: None Had a fall or experienced change in No Patient Identification Verified: Yes activities of daily living that may affect Secondary Verification Process Completed: Yes risk of falls: Patient Requires Transmission-Based No Signs or symptoms of abuse/neglect since last visito No Precautions: Hospitalized since last visit: No Patient Has Alerts: Yes Implantable device outside of the clinic excluding No Patient Alerts: 6/14Non-compressible cellular tissue based products placed in the center >220 since last visit: Has Dressing in Place as Prescribed: Yes Has Compression in Place as Prescribed: Yes Pain Present Now: Yes Electronic Signature(s) Signed: 11/08/2022 12:39:59 PM By: Betha Loa Entered By: Betha Loa on 11/08/2022 09:29:06 -------------------------------------------------------------------------------- Clinic Level of Care Assessment Details Patient Name: Date of ServiceNancie Neas [place] J. 11/08/2022 9:30 A M Medical Record Number: [id number] Patient Account Number: [id number] Date of Birth/Sex: Treating RN: 1950/06/23 (72 y.o. Esmeralda Links Primary Care Reynaldo Rossman: Daniel Nones Other Clinician: Betha Loa Referring  Eban Weick: Treating Tesha Archambeau/Extender: Lois Huxley in Treatment: 3 Clinic Level of Care Assessment Items TOOL 1 Quantity Score []  - 0 Use when EandM and Procedure is performed on INITIAL visit ASSESSMENTS - Nursing Assessment / Reassessment []  - 0 General Physical Exam (combine w/ comprehensive assessment (listed just below) when performed on new pt. [address]ZIANN, SKUBIC ([id number]) [id number] Page 2 of 7 []  - 0 Comprehensive Assessment (HX, ROS, Risk Assessments, Wounds Hx, etc.) ASSESSMENTS - Wound and Skin Assessment / Reassessment []  - 0 Dermatologic / Skin Assessment (not related to wound area) ASSESSMENTS - Ostomy and/or Continence Assessment and Care []  - 0 Incontinence Assessment and Management []  - 0 Ostomy Care Assessment and Management (repouching, etc.) PROCESS - Coordination of Care []  - 0 Simple Patient / Family Education for ongoing care []  - 0 Complex (extensive) Patient / Family Education for ongoing care []  - 0 Staff obtains Chiropractor, Records, T Results / Process Orders est []  - 0 Staff telephones HHA, Nursing Homes / Clarify orders / etc []  - 0 Routine Transfer to another Facility (non-emergent condition) []  - 0 Routine Hospital Admission (non-emergent condition) []  - 0 New Admissions / Manufacturing engineer / Ordering NPWT Apligraf, etc. , []  - 0 Emergency Hospital Admission (emergent condition) PROCESS - Special Needs []  - 0 Pediatric / Minor Patient Management []  - 0 Isolation Patient Management []  - 0 Hearing / Language / Visual special needs []  - 0 Assessment of Community assistance (transportation, D/C planning, etc.) []  - 0 Additional assistance / Altered mentation []  - 0 Support Surface(s) Assessment (bed, cushion, seat, etc.) INTERVENTIONS - Miscellaneous []  - 0 External ear exam []  - 0 Patient Transfer (multiple staff / Nurse, adult / Similar devices) []  - 0 Simple Staple / Suture  removal (25 or less) []  - 0 Complex Staple / Suture removal (26 or more) []  - 0 Hypo/Hyperglycemic Management (do not check if billed separately) []  - 0 Ankle /  Brachial Index (ABI) - do not check if billed separately Has the patient been seen at the hospital within the last three years: Yes Total Score: 0 Level Of Care: ____ Electronic Signature(s) Signed: 11/08/2022 12:39:59 PM By: Betha Loa Entered By: Betha Loa on 11/08/2022 10:13:26 -------------------------------------------------------------------------------- Encounter Discharge Information Details Patient Name: Date of Service: [name], [name] 11/08/2022 9:30 A M Medical Record Number: [id number] Patient Account Number: [id number] Date of Birth/Sex: Treating RN: 1951/03/09 (72 y.o. Esmeralda Links Primary Care [name]: Daniel Nones Other Clinician: Taydem, Gemmer ([id number]) [id number] Page 3 of 7 Referring Haim Hansson: Treating [name]/Extender: Lois Huxley in Treatment: 3 Encounter Discharge Information Items Post Procedure Vitals Discharge Condition: Stable Temperature (F): 97.9 Ambulatory Status: Ambulatory Pulse (bpm): 79 Discharge Destination: Home Respiratory Rate (breaths/min): 16 Transportation: Private Auto Blood Pressure (mmHg): 147/90 Accompanied By: self Schedule Follow-up Appointment: Yes Clinical Summary of Care: Electronic Signature(s) Signed: 11/08/2022 12:39:59 PM By: Betha Loa Entered By: Betha Loa on 11/08/2022 10:23:09 -------------------------------------------------------------------------------- Lower Extremity Assessment Details Patient Name: Date of Service: [name], [name] [name] 11/08/2022 9:30 A M Medical Record Number: [id number] Patient Account Number: [id number] Date of Birth/Sex: Treating RN: 04-30-1951 (72 y.o. Esmeralda Links Primary Care Arminda Foglio: Daniel Nones Other Clinician: Betha Loa Referring  Pasquale Matters: Treating Detric Scalisi/Extender: [name] in Treatment: 3 Edema Assessment Assessed: [Left: No] [Right: Yes] Edema: [Left: Ye] [Right: s] Calf Left: Right: Point of Measurement: 31 cm From Medial Instep 31.5 cm Ankle Left: Right: Point of Measurement: 8 cm From Medial Instep 19.3 cm Vascular Assessment Pulses: Dorsalis Pedis Palpable: [Right:Yes] Electronic Signature(s) Signed: 11/08/2022 12:39:59 PM By: Betha Loa Signed: 11/08/2022 1:30:41 PM By: Angelina Pih Entered By: Betha Loa on 11/08/2022 09:38:29 [place] ([id number]) [id number] Page 4 of 7 -------------------------------------------------------------------------------- Multi Wound Chart Details Patient Name: Date of Service: [name], [place] [name] 11/08/2022 9:30 A M Medical Record Number: [id number] Patient Account Number: [id number] Date of Birth/Sex: Treating RN: [birth year] (72 y.o. Esmeralda Links Primary Care Brynlei Klausner: Daniel Nones Other Clinician: Betha Loa Referring Karlei Waldo: Treating Nicoletta Hush/Extender: Lois Huxley in Treatment: 3 Vital Signs Height(in): 63 Pulse(bpm): 79 Weight(lbs): 128 Blood Pressure(mmHg): 147/90 Body Mass Index(BMI): 22.7 Temperature(F): 97.9 Respiratory Rate(breaths/min): 16 [1:Photos:] [N/A:N/A] Right, Midline Lower Leg N/A N/A Wound Location: Trauma N/A N/A Wounding Event: Trauma, Other N/A N/A Primary Etiology: Chronic sinus problems/congestion N/A N/A Comorbid History: 10/08/2022 N/A N/A Date Acquired: 3 N/A N/A Weeks of Treatment: Open N/A N/A Wound Status: No N/A N/A Wound Recurrence: Yes N/A N/A Clustered Wound: 3 N/A N/A Clustered Quantity: 0.7x0.3x0.2 N/A N/A Measurements L x W x D (cm) 0.165 N/A N/A A (cm) : rea 0.033 N/A N/A Volume (cm) : 99.30% N/A N/A % Reduction in A rea: 99.60% N/A N/A % Reduction in Volume: Full Thickness With Exposed Support N/A  N/A Classification: Structures Medium N/A N/A Exudate Amount: Serosanguineous N/A N/A Exudate Type: red, brown N/A N/A Exudate Color: Flat and Intact N/A N/A Wound Margin: Medium (34-66%) N/A N/A Granulation Amount: Red N/A N/A Granulation Quality: Medium (34-66%) N/A N/A Necrotic Amount: Fat Layer (Subcutaneous Tissue): Yes N/A N/A Exposed Structures: Muscle: Yes Fascia: No Tendon: No Joint: No Bone: No Small (1-33%) N/A N/A Epithelialization: Treatment Notes Electronic Signature(s) Signed: 11/08/2022 12:39:59 PM By: Betha Loa Entered By: Betha Loa on 11/08/2022 09:38:56  [place] ([id number]) [id number] Page 5 of 7 -------------------------------------------------------------------------------- Pain Assessment Details Patient Name: Date of Service: LA YNE,  [name] 11/08/2022 9:30 A M Medical Record Number: [id number] Patient Account Number: [id number] Date of Birth/Sex: Treating RN: Aug 05, 1950 (72 y.o. Esmeralda Links Primary Care Jennavie Martinek: Daniel Nones Other Clinician: Betha Loa Referring Tyriq Moragne: Treating Elhadj Girton/Extender: Lois Huxley in Treatment: 3 Active Problems Location of Pain Severity and Description of Pain Patient Has Paino Yes Site Locations Pain Location: Generalized Pain, Pain in Ulcers Duration of the Pain. Constant / Intermittento Intermittent Rate the pain. Current Pain Level: 3 Character of Pain Describe the Pain: Aching, Other: 'feels heavy' Pain Management and Medication Current Pain Management: Medication: Yes Cold Application: No Rest: No Massage: No Activity: No T.E.N.S.: No Heat Application: No Leg drop or elevation: No Is the Current Pain Management Adequate: Inadequate How does your wound impact your activities of daily livingo Sleep: No Bathing: No Appetite: No Relationship With Others: No Bladder Continence: No Emotions: No Bowel Continence:  No Work: No Toileting: No Drive: No Dressing: No Hobbies: No Electronic Signature(s) Signed: 11/08/2022 12:39:59 PM By: Betha Loa Signed: 11/08/2022 1:30:41 PM By: Angelina Pih Entered By: Betha Loa on 11/08/2022 09:32:24 [place] ([id number]) [id number] Page 6 of 7 -------------------------------------------------------------------------------- Wound Assessment Details Patient Name: Date of Service: [name], [place] [name] 11/08/2022 9:30 A M Medical Record Number: [id number] Patient Account Number: [id number] Date of Birth/Sex: Treating RN: [birth year] (72 y.o. [name] Riser, Luther Parody Primary Care Adelfa Lozito: Daniel Nones Other Clinician: Betha Loa Referring Zamani Crocker: Treating Anylah Scheib/Extender: [name] in Treatment: 3 Wound Status Wound Number: 1 Primary Etiology: Trauma, Other Wound Location: Right, Midline Lower Leg Wound Status: Open Wounding Event: Trauma Comorbid History: Chronic sinus problems/congestion Date Acquired: 10/08/2022 Weeks Of Treatment: 3 Clustered Wound: Yes Photos Wound Measurements Length: (cm) Width: (cm) Depth: (cm) Clustered Quantity: Area: (cm) Volume: (cm) 0.7 % Reduction in Area: 99.3% 0.3 % Reduction in Volume: 99.6% 0.2 Epithelialization: Small (1-33%) 3 0.165 0.033 Wound Description Classification: Full Thickness With Exposed Suppo Wound Margin: Flat and Intact Exudate Amount: Medium Exudate Type: Serosanguineous Exudate Color: red, brown rt Structures Foul Odor After Cleansing: No Slough/Fibrino Yes Wound Bed Granulation Amount: Medium (34-66%) Exposed Structure Granulation Quality: Red Fascia Exposed: No Necrotic Amount: Medium (34-66%) Fat Layer (Subcutaneous Tissue) Exposed: Yes Necrotic Quality: Adherent Slough Tendon Exposed: No Muscle Exposed: Yes Necrosis of Muscle: No Joint Exposed: No Bone Exposed: No Treatment Notes Wound #1 (Lower Leg) Wound Laterality:  Right, Midline Cleanser Vashe 5.8 (oz) ROMIE, CRISSEY ([id number]) [id number] Page 7 of 7 Discharge Instruction: Use vashe 5.8 (oz) as directed Peri-Wound Care Topical Mupirocin Ointment Discharge Instruction: Apply as directed by Dalin Caldera. Primary Dressing Hydrofera Blue Ready Transfer Foam, 4x5 (in/in) Discharge Instruction: Apply Hydrofera Blue Ready to wound bed as directed (Border) Zetuvit Plus Silicone Border Dressing 5x5 (in/in) Secondary Dressing Secured With Tubigrip Size C, 2.75x10 (in/yd) Discharge Instruction: Apply 3 Tubigrip C 3-finger-widths below knee to base of toes to secure dressing and/or for swelling. Compression Wrap Compression Stockings Add-Ons Electronic Signature(s) Signed: 11/08/2022 12:39:59 PM By: Betha Loa Signed: 11/08/2022 1:30:41 PM By: Angelina Pih Entered By: Betha Loa on 11/08/2022 09:37:34 -------------------------------------------------------------------------------- Vitals Details Patient Name: Date of Service: [name], [name] 11/08/2022 9:30 A M Medical Record Number: [id number] Patient Account Number: [id number] Date of Birth/Sex: Treating RN: 10-Oct-1950 (72 y.o. Esmeralda Links Primary Care Kismet Facemire: Daniel Nones Other Clinician: Betha Loa Referring Seana Underwood: Treating Terrilynn Postell/Extender: [name] in Treatment: 3 Vital Signs Time Taken: 09:30 Temperature (F): 97.9 Height (in):  63 Pulse (bpm): 79 Weight (lbs): 128 Respiratory Rate (breaths/min): 16 Body Mass Index (BMI): 22.7 Blood Pressure (mmHg): 147/90 Reference Range: 80 - 120 mg / dl Electronic Signature(s) Signed: 11/08/2022 12:39:59 PM By: Betha Loa Entered By: Betha Loa on 11/08/2022 09:32:20

## 2022-11-11 ENCOUNTER — Other Ambulatory Visit: Payer: Self-pay

## 2022-11-11 MED ORDER — CYCLOBENZAPRINE HCL 5 MG PO TABS
5.0000 mg | ORAL_TABLET | Freq: Every day | ORAL | 1 refills | Status: DC
  Filled 2022-11-11: qty 60, 60d supply, fill #0
  Filled 2022-12-09: qty 60, 60d supply, fill #1
  Filled 2023-01-07: qty 30, 30d supply, fill #1

## 2022-11-11 MED ORDER — METHOCARBAMOL 500 MG PO TABS
500.0000 mg | ORAL_TABLET | Freq: Three times a day (TID) | ORAL | 1 refills | Status: DC
  Filled 2022-11-11: qty 60, 20d supply, fill #0
  Filled 2022-12-09: qty 60, 20d supply, fill #1

## 2022-11-11 MED ORDER — MELOXICAM 15 MG PO TABS
15.0000 mg | ORAL_TABLET | Freq: Every day | ORAL | 1 refills | Status: DC
  Filled 2022-11-11: qty 30, 30d supply, fill #0
  Filled 2022-12-09: qty 30, 30d supply, fill #1

## 2022-11-12 ENCOUNTER — Ambulatory Visit: Payer: PRIVATE HEALTH INSURANCE | Attending: Orthopedic Surgery | Admitting: Physical Therapy

## 2022-11-12 ENCOUNTER — Encounter: Payer: Self-pay | Admitting: Physical Therapy

## 2022-11-12 ENCOUNTER — Other Ambulatory Visit: Payer: Self-pay

## 2022-11-12 DIAGNOSIS — M5459 Other low back pain: Secondary | ICD-10-CM | POA: Diagnosis present

## 2022-11-12 DIAGNOSIS — R2689 Other abnormalities of gait and mobility: Secondary | ICD-10-CM | POA: Insufficient documentation

## 2022-11-12 DIAGNOSIS — M6281 Muscle weakness (generalized): Secondary | ICD-10-CM | POA: Insufficient documentation

## 2022-11-12 DIAGNOSIS — R262 Difficulty in walking, not elsewhere classified: Secondary | ICD-10-CM | POA: Insufficient documentation

## 2022-11-12 NOTE — Therapy (Unsigned)
OUTPATIENT PHYSICAL THERAPY THORACOLUMBAR EVALUATION   Patient Name: [name] MRN: [id number] DOB:23-Dec-1950, 72 y.o., female Today's Date: 11/12/2022  END OF SESSION:  PT End of Session - 11/12/22 1628     Visit Number 1    Number of Visits 8    Date for PT Re-Evaluation 12/17/22    Authorization - Visit Number 1    Authorization - Number of Visits 8    Progress Note Due on Visit 8    PT Start Time 1525    PT Stop Time 1614    PT Time Calculation (min) 49 min    Activity Tolerance Patient tolerated treatment well    Behavior During Therapy [facility] for tasks assessed/performed             Past Medical History:  Diagnosis Date   Arthritis    Basal cell carcinoma 12/08/2008   right sup medial breast   Basal cell carcinoma 06/10/2017   left lat nasal bridge inf to medial canthus   Basal cell carcinoma 03/02/2019   right distal med calf   History of dysplastic nevus 05/02/2010   right sup pubic/mild   Motion sickness    boats   Vertigo 2020   No issues since Rehab   Past Surgical History:  Procedure Laterality Date   ABDOMINAL HYSTERECTOMY     BROW LIFT Bilateral 05/24/2022   Procedure: BLEPHAROPLASTY UPPER EYELID; W/EXCESS SKIN BLEPHAROPTOSIS REPAIR; RESECT EX BILATERAL;  Surgeon: Imagene Riches, MD;  Location: [facility];  Service: Ophthalmology;  Laterality: Bilateral;  needs to be first   COLONOSCOPY     several times   COLONOSCOPY WITH PROPOFOL N/A 04/13/2021   Procedure: COLONOSCOPY WITH PROPOFOL;  Surgeon: Pasty Spillers, MD;  Location: [facility];  Service: Gastroenterology;  Laterality: N/A;   KNEE SURGERY     TOTAL VAGINAL HYSTERECTOMY     Patient Active Problem List   Diagnosis Date Noted   Screen for colon cancer    Polyp of descending colon    Osteopenia of multiple sites 08/11/2018   Near syncope 04/22/2018   Personal history of other malignant neoplasm of skin 09/24/2017   Osteoarthritis of carpometacarpal (CMC) joint of thumb  09/05/2017   Costochondritis 12/26/2015   Allergic state 02/09/2015   Adult hypothyroidism 02/09/2015   OP (osteoporosis) 02/09/2015   AV (anaerobic vaginosis) 11/18/2014   Non-traumatic rotator cuff tear 09/29/2013    PCP: Lynnea Ferrier, MD   REFERRING PROVIDER: Estill Bamberg, MD    REFERRING DIAG: Cervical, thoracic, and lumbar pain  Rationale for Evaluation and Treatment: Rehabilitation  THERAPY DIAG:  Other low back pain  Other abnormalities of gait and mobility  ONSET DATE: 5 weeks ago  SUBJECTIVE:  SUBJECTIVE STATEMENT: Set up for meeting at work, pulled a stuck table twice.   PERTINENT HISTORY:  Pt reporting to physical therapy following a fall incident 5 weeks ago in June. Her current resting pain level is 4/10 with pain limiting her lumbar ROM and functional tasks such as lifting objects and job related tasks.  PAIN:  Are you having pain? Yes: NPRS scale: 4/10 Pain location: all along spine, shoulder,  Pain description: tight feeling Aggravating factors: seated in car Relieving factors: side lying, only been taking OTC since the first week  PRECAUTIONS: None  WEIGHT BEARING RESTRICTIONS: Yes 10 lifting restriction.  FALLS:  Has patient fallen in last 6 months? Yes. Number of falls 1, incident  LIVING ENVIRONMENT: Lives with: lives with their spouse Lives in: House/apartment Stairs: Yes: External: 5 steps; on right going up Has following equipment at home: None  OCCUPATION: Admin assistant  PLOF: Independent  PATIENT GOALS: leg better, decrease pain, go on vacation  NEXT MD VISIT: Aug 9th, 2024  OBJECTIVE:    PATIENT SURVEYS:  Modified Oswestry 28; 56%  FOTO 51, potential for 61  SCREENING FOR RED FLAGS: Bowel or bladder incontinence: No Spinal  tumors: No Cauda equina syndrome: No Compression fracture: No Abdominal aneurysm: No  COGNITION: Overall cognitive status: Within functional limits for tasks assessed       POSTURE: rounded shoulders and forward head  PALPATION: Palpation along spine; Multiple trigger points in low back, also in mid-upper trapezius.  LUMBAR ROM:   AROM eval  Flexion WFL  Extension Limited, pt apprehensive  Right lateral flexion Limited  Left lateral flexion Limited, notably painful  Right rotation   Left rotation    (Blank rows = not tested)    LOWER EXTREMITY MMT:    MMT Right eval Left eval  Hip flexion 4+ 4+  Hip extension    Hip abduction 4+ 4+  Hip adduction 4 4  Hip internal rotation    Hip external rotation    Knee flexion 4- 4-  Knee extension 4+ 4+  Ankle dorsiflexion 4+ 4+  Ankle plantarflexion    Ankle inversion    Ankle eversion     (Blank rows = not tested)    FUNCTIONAL TESTS:  5 times sit to stand: Plan to assess 2nd session Timed up and go (TUG): Plan to assess 2nd session 10 meter walk test: Plan to assess 2nd session    TODAY'S TREATMENT:                                                                                                                              DATE: 11/12/22  Initial evaluation preformed today utilizing the FOTO and ODI outcome surveys. Additional treatment and interventions can be found below:  Treatment:   Manual therapy: Palpation revealed multiple trigger points around lower back, limiting lumbar and spine ROM per above.  -TrP release x 2 in R lower back (R lumbar paraspinals, R QL)  with pt reporting a decrease in pain to 3/10 and showing greater ease with L side bending in seated and standing.  -Grade 2-3 Lumbar mobilizations L3-L5 x 30 bouts with intermittent STM to paraspinals in area.   TherEx: HEP preformed consisting of the following: -Scapular retraction x 10 with verbal and tactile cues. Pt notably limited in  ability and range to perform exercise.  -Seated thoracic extension with arms crossed x 10 -Seated piriformis stretch 1 x 45 sec hold ea side.  PATIENT EDUCATION:  Education details: Educated on outcome surveys taken, interventions done today, as well as HEP instructions Person educated: Patient Education method: Explanation Education comprehension: verbalized understanding  HOME EXERCISE PROGRAM: Access Code: [email or web address] URL: [email or web address] Date: 11/12/2022 Prepared by: Thresa Ross  Exercises - Seated Scapular Retraction  - 2 x daily - 7 x weekly - 2 sets - 10 reps - 15 sec  hold -  Seated Piriformis Stretch  - 2 x daily - 7 x weekly - 3 sets - 45 sec  hold -  Seated Thoracic Lumbar Extension  - 2 x daily - 7 x weekly - 2 sets - 10 reps - 15 sec  hold    ASSESSMENT:  CLINICAL IMPRESSION: Patient is a 72 y.o. female who was seen today for physical therapy evaluation and treatment for back pain. Pt has global pain along back at a resting level of 4/10 NPS, significant pain in lower back compared to rest of spine. Secondary pain and tightness in middle and upper trapezius. Abnormal gait is present with notable anterior lean due to flexion bias.  Pt will continue to benefit from skilled physical therapy intervention to address impairments, improve QOL, and attain therapy goals.    OBJECTIVE IMPAIRMENTS: Abnormal gait, decreased mobility, impaired flexibility, improper body mechanics, postural dysfunction, and pain.   ACTIVITY LIMITATIONS: carrying, lifting, bending, sitting, and squatting  PARTICIPATION LIMITATIONS: community activity and occupation  PERSONAL FACTORS: Past/current experiences and Time since onset of injury/illness/exacerbation are also affecting patient's functional outcome.   REHAB POTENTIAL: Good  CLINICAL DECISION MAKING: Stable/uncomplicated  EVALUATION COMPLEXITY: Low   GOALS: Goals reviewed with patient? No  SHORT TERM GOALS:  Target date: 11/26/2022    Patient will be independent in home exercise program to improve strength/mobility for better functional independence with ADLs. Baseline: No HEP currently  Goal status: INITIAL   LONG TERM GOALS: Target date: 12/17/2022  1.  Patient will increase FOTO score to equal to or greater than 61 to demonstrate statistically significant improvement in mobility and quality of life.  Baseline: 51 Goal status: INITIAL  2.  Patient will decrease ODI score to equal to or less than a score of 20 to demonstrate statistically significant improvement in mobility and quality of life.  Baseline: score of 28, 56% Goal status: INITIAL  3.  Patient will complete five times sit to stand in < 15 seconds without use of UE and no increase in pain, indicating an increased LE strength, lower back  Baseline:  Goal status: INITIAL   5.  Patient will show greater Lumber ROM by being able to lift objects, and complete chores while maintaining a proper functional position without pain. Baseline: per ODI, lifting heavier objects and standing tasks are limited by pain. Goal status: INITIAL   PLAN:  PT FREQUENCY: 1-2x/week  PT DURATION: other: 5 weeks  PLANNED INTERVENTIONS: Therapeutic exercises, Therapeutic activity, Neuromuscular re-education, Balance training, Gait training, Patient/Family education, Self Care, Joint mobilization, Dry Needling, Spinal mobilization, Cryotherapy, Moist heat,  Manual therapy, and Re-evaluation.  PLAN FOR NEXT SESSION: Perform functional outcome measures, bring up the possibility of dry needling if applicable, check HEP and adjust as needed.   Nani Gasser, Student-PT 11/12/2022, 5:16 PM

## 2022-11-13 ENCOUNTER — Ambulatory Visit: Payer: PRIVATE HEALTH INSURANCE

## 2022-11-13 ENCOUNTER — Encounter: Payer: Self-pay | Admitting: Physical Therapy

## 2022-11-15 ENCOUNTER — Encounter: Payer: PRIVATE HEALTH INSURANCE | Admitting: Physician Assistant

## 2022-11-15 DIAGNOSIS — L97812 Non-pressure chronic ulcer of other part of right lower leg with fat layer exposed: Secondary | ICD-10-CM | POA: Diagnosis not present

## 2022-11-15 NOTE — Progress Notes (Addendum)
[name] ([id number]) [id number] Page 1 of 7 Visit Report for 11/15/2022 Chief Complaint Document Details Patient Name: Date of Service: [name], [place] [name] 11/15/2022 8:30 A M Medical Record Number: [id number] Patient Account Number: [id number] Date of Birth/Sex: Treating RN: March 27, 1951 (72 y.o. Skip Mayer Primary Care Provider: Daniel Nones Other Clinician: Referring Provider: Treating Provider/Extender: Lois Huxley in Treatment: 4 Information Obtained from: Patient Chief Complaint Right LE ulcer Electronic Signature(s) Signed: 11/15/2022 8:55:28 AM By: Allen Derry PA-C Entered By: Allen Derry on 11/15/2022 08:55:28 -------------------------------------------------------------------------------- Debridement Details Patient Name: Date of Service: [name], [name] 11/15/2022 8:30 A M Medical Record Number: [id number] Patient Account Number: [id number] Date of Birth/Sex: Treating RN: 10-Mar-1951 (72 y.o. Skip Mayer Primary Care Provider: Daniel Nones Other Clinician: Referring Provider: Treating Provider/Extender: Lois Huxley in Treatment: 4 Debridement Performed for Assessment: Wound #1 Right,Midline Lower Leg Performed By: Physician Allen Derry, PA-C Debridement Type: Debridement Level of Consciousness (Pre-procedure): Awake and Alert Pre-procedure Verification/Time Out Yes - 09:00 Taken: Percent of Wound Bed Debrided: 100% T Area Debrided (cm): otal 0.06 Tissue and other material debrided: Viable, Non-Viable, Slough, Subcutaneous, Slough Level: Skin/Subcutaneous Tissue Debridement Description: Excisional Instrument: Forceps Bleeding: Minimum Hemostasis Achieved: Pressure Response to Treatment: Procedure was tolerated well Level of Consciousness (Post- Awake and Alert procedure): Post Debridement Measurements of Total Wound CORTNEY, MCCURLEY ([id number]) [id number] Page 2 of  7 Length: (cm) 0.4 Width: (cm) 0.2 Depth: (cm) 0.2 Volume: (cm) 0.013 Character of Wound/Ulcer Post Debridement: Stable Post Procedure Diagnosis Same as Pre-procedure Electronic Signature(s) Signed: 11/15/2022 2:27:58 PM By: Allen Derry PA-C Signed: 11/26/2022 8:06:11 AM By: Elliot Gurney, BSN, RN, CWS, Kim RN, BSN Entered By: Elliot Gurney, BSN, RN, CWS, Kim on 11/15/2022 09:00:58 -------------------------------------------------------------------------------- HPI Details Patient Name: Date of Service: [name], [name] 11/15/2022 8:30 A M Medical Record Number: [id number] Patient Account Number: [id number] Date of Birth/Sex: Treating RN: 1951-01-25 (72 y.o. Skip Mayer Primary Care Provider: Daniel Nones Other Clinician: Referring Provider: Treating Provider/Extender: Lois Huxley in Treatment: 4 History of Present Illness HPI Description: 10-18-2022 patient presents today for initial inspection here in our clinic concerning issues that she has been having with a wound on her right anterior lower extremity. Unfortunately the patient had an issue where she was setting up a table and the table was leg had gotten stuck. When she was trying to dislodge it came loose too quickly and she ended up actually falling and this caused a significant issue with a skin tear on the right shin among other things including hitting her head and hurting her neck as well. Fortunately I do not see any signs overall of infection right now but there is definitely some need for sharp debridement in regard to the necrotic tissue at this point on the right anterior shin. 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 claim. Patient has an elevated blood pressure reading today but no diagnosis of hypertension she has whitecoat syndrome otherwise she is a fairly healthy individual as far as wound healing is concerned. 6/19; patient was admitted to our clinic last week on 6/14 for traumatic leg  wound. Upon having her dressing changed today it was noted she was having more serous drainage from the Opening in the center Of the wound and patient was concerned for infection and came in to be evaluated. Currently she denies fever/chills, increased warmth or erythema to the surrounding skin or  Purulent drainage. She has been using collagen and Xeroform to the wound beds. 10-25-2022 upon evaluation today patient's wound actually appears to be doing much better at this time. Fortunately I do not see any signs of active infection locally or systemically which is good news that she actually seems to be better than apparently she was on Wednesday. Nonetheless she still has the center area that keeps collecting fluid and sealing up and then subsequently draining I think if we keep her swelling under control this will alleviate a lot of this issue in general. I discussed that with her today. 11-01-2022 upon evaluation today patient appears to be doing well currently in regard to her wound. She has been tolerating the dressing changes without complication. Fortunately there does not appear to be any signs of active infection locally nor systemically which is great news. I am actually extremely pleased with where things stand at this point. 11-08-2022 upon evaluation today patient appears to be doing well currently in regard to her wound. She has been tolerating the dressing changes without complication. Fortunately there does not appear to be any signs of active infection locally nor systemically at this time which is great news. No fevers, chills, nausea, vomiting, or diarrhea. 11-15-2022 upon evaluation today patient's wound is actually showing signs of excellent improvement. There is just a very small area that is remaining open at this time and in general I think that we are making good headway towards closure. I do not see any signs of active infection locally or systemically which is great  news. Electronic Signature(s) Signed: 11/15/2022 2:05:14 PM By: Allen Derry PA-C Entered By: Allen Derry on 11/15/2022 14:05:14 [place] ([id number]) [id number] Page 3 of 7 -------------------------------------------------------------------------------- Physical Exam Details Patient Name: Date of Service: [name], [place] [name] 11/15/2022 8:30 A M Medical Record Number: [id number] Patient Account Number: [id number] Date of Birth/Sex: Treating RN: 14-Jun-1950 (72 y.o. Skip Mayer Primary Care Provider: Daniel Nones Other Clinician: Referring Provider: Treating Provider/Extender: [name] in Treatment: 4 Constitutional Well-nourished and well-hydrated in no acute distress. Respiratory normal breathing without difficulty. Psychiatric this patient is able to make decisions and demonstrates good insight into disease process. Alert and Oriented x 3. pleasant and cooperative. Notes Upon inspection patient's wound bed did require little bit of sharp debridement to clear away some of the dried drainage in the midst of the wound. This still has a little bit of depth to it and to be honest I think collagen would probably be the best way to go we just need to make sure that we do not have the collagen dry up in there. She voiced understanding. Over that collagen will be to put some hydrogel and see if we can keep it from drying out. Electronic Signature(s) Signed: 11/15/2022 2:05:35 PM By: Allen Derry PA-C Entered By: Allen Derry on 11/15/2022 14:05:35 -------------------------------------------------------------------------------- Physician Orders Details Patient Name: Date of Service: [name], [name] 11/15/2022 8:30 A M Medical Record Number: [id number] Patient Account Number: [id number] Date of Birth/Sex: Treating RN: 1951/03/16 (72 y.o. Skip Mayer Primary Care Provider: Daniel Nones Other Clinician: Referring Provider: Treating  Provider/Extender: Lois Huxley in Treatment: 4 Verbal / Phone Orders: No Diagnosis Coding ICD-10 Coding Code Description [phone] Laceration without foreign body, right lower leg, initial encounter L97.812 Non-pressure chronic ulcer of other part of right lower leg with fat layer exposed R03.0 Elevated blood-pressure reading, without diagnosis of hypertension Follow-up Appointments Return Appointment  in 1 week. Nurse Visit as needed PAISLEYANN, DEWITT ([id number]) [id number] Page 4 of 7 Bathing/ Shower/ Hygiene May shower with wound dressing protected with water repellent cover or cast protector. Anesthetic (Use 'Patient Medications' Section for Anesthetic Order Entry) Lidocaine applied to wound bed Additional Orders / Instructions Follow Nutritious Diet and Increase Protein Intake Medications-Please add to medication list. ntibiotics - finish antibiotic P.O. A Topical A ntibiotic Wound Treatment Wound #1 - Lower Leg Wound Laterality: Right, Midline Cleanser: Vashe 5.8 (oz) 1 x Per Day/30 Days Discharge Instructions: Use vashe 5.8 (oz) as directed Topical: Mupirocin Ointment 1 x Per Day/30 Days Discharge Instructions: Apply as directed by provider. Prim Dressing: Prisma 4.34 (in) 1 x Per Day/30 Days ary Discharge Instructions: Moisten w/normal saline or sterile water; Cover wound as directed. put hydrogel over top of the collagen Prim Dressing: (Border) Zetuvit Plus Silicone Border Dressing 5x5 (in/in) (Generic) 1 x Per Day/30 Days ary Secured With: Tubigrip Size C, 2.75x10 (in/yd) 1 x Per Day/30 Days Discharge Instructions: Apply 3 Tubigrip C 3-finger-widths below knee to base of toes to secure dressing and/or for swelling. Electronic Signature(s) Signed: 11/15/2022 2:07:17 PM By: Allen Derry PA-C Entered By: Allen Derry on 11/15/2022 14:07:17 -------------------------------------------------------------------------------- Problem  List Details Patient Name: Date of Service: [name], [name] 11/15/2022 8:30 A M Medical Record Number: [id number] Patient Account Number: [id number] Date of Birth/Sex: Treating RN: 1950/12/21 (72 y.o. Skip Mayer Primary Care Provider: Daniel Nones Other Clinician: Referring Provider: Treating Provider/Extender: Lois Huxley in Treatment: 4 Active Problems ICD-10 Encounter Code Description Active Date MDM Diagnosis S81.811A Laceration without foreign body, right lower leg, initial encounter 10/18/2022 No Yes L97.812 Non-pressure chronic ulcer of other part of right lower leg with fat layer 10/18/2022 No Yes exposed R03.0 Elevated blood-pressure reading, without diagnosis of hypertension 10/18/2022 No Yes KECHIA, GROULX ([id number]) [id number] Page 5 of 7 Inactive Problems Resolved Problems Electronic Signature(s) Signed: 11/15/2022 8:55:26 AM By: Allen Derry PA-C Entered By: Allen Derry on 11/15/2022 08:55:25 -------------------------------------------------------------------------------- Progress Note Details Patient Name: Date of Service: [name], [name] 11/15/2022 8:30 A M Medical Record Number: [id number] Patient Account Number: [id number] Date of Birth/Sex: Treating RN: 03/17/1951 (72 y.o. Skip Mayer Primary Care Provider: Daniel Nones Other Clinician: Referring Provider: Treating Provider/Extender: Lois Huxley in Treatment: 4 Subjective Chief Complaint Information obtained from Patient Right LE ulcer History of Present Illness (HPI) 10-18-2022 patient presents today for initial inspection here in our clinic concerning issues that she has been having with a wound on her right anterior lower extremity. Unfortunately the patient had an issue where she was setting up a table and the table was leg had gotten stuck. When she was trying to dislodge it came loose too quickly and she ended up actually falling and  this caused a significant issue with a skin tear on the right shin among other things including hitting her head and hurting her neck as well. Fortunately I do not see any signs overall of infection right now but there is definitely some need for sharp debridement in regard to the necrotic tissue at this point on the right anterior shin. 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 claim. Patient has an elevated blood pressure reading today but no diagnosis of hypertension she has whitecoat syndrome otherwise she is a fairly healthy individual as far as wound healing is concerned. 6/19; patient was admitted to our clinic last week on  6/14 for traumatic leg wound. Upon having her dressing changed today it was noted she was having more serous drainage from the Opening in the center Of the wound and patient was concerned for infection and came in to be evaluated. Currently she denies fever/chills, increased warmth or erythema to the surrounding skin or Purulent drainage. She has been using collagen and Xeroform to the wound beds. 10-25-2022 upon evaluation today patient's wound actually appears to be doing much better at this time. Fortunately I do not see any signs of active infection locally or systemically which is good news that she actually seems to be better than apparently she was on Wednesday. Nonetheless she still has the center area that keeps collecting fluid and sealing up and then subsequently draining I think if we keep her swelling under control this will alleviate a lot of this issue in general. I discussed that with her today. 11-01-2022 upon evaluation today patient appears to be doing well currently in regard to her wound. She has been tolerating the dressing changes without complication. Fortunately there does not appear to be any signs of active infection locally nor systemically which is great news. I am actually extremely pleased with where things stand at this  point. 11-08-2022 upon evaluation today patient appears to be doing well currently in regard to her wound. She has been tolerating the dressing changes without complication. Fortunately there does not appear to be any signs of active infection locally nor systemically at this time which is great news. No fevers, chills, nausea, vomiting, or diarrhea. 11-15-2022 upon evaluation today patient's wound is actually showing signs of excellent improvement. There is just a very small area that is remaining open at this time and in general I think that we are making good headway towards closure. I do not see any signs of active infection locally or systemically which is great news. Objective Constitutional SOPHIEANN, VANORNUM ([id number]) [id number] Page 6 of 7 Well-nourished and well-hydrated in no acute distress. Vitals Time Taken: 8:46 AM, Height: 63 in, Weight: 128 lbs, BMI: 22.7, Temperature: 97.7 F, Pulse: 64 bpm, Respiratory Rate: 16 breaths/min, Blood Pressure: 115/74 mmHg. Respiratory normal breathing without difficulty. Psychiatric this patient is able to make decisions and demonstrates good insight into disease process. Alert and Oriented x 3. pleasant and cooperative. General Notes: Upon inspection patient's wound bed did require little bit of sharp debridement to clear away some of the dried drainage in the midst of the wound. This still has a little bit of depth to it and to be honest I think collagen would probably be the best way to go we just need to make sure that we do not have the collagen dry up in there. She voiced understanding. Over that collagen will be to put some hydrogel and see if we can keep it from drying out. Integumentary (Hair, Skin) Wound #1 status is Open. Original cause of wound was Trauma. The date acquired was: 10/08/2022. The wound has been in treatment 4 weeks. The wound is located on the Right,Midline Lower Leg. The wound measures 4cm length x  0.2cm width x 0.1cm depth; 0.628cm^2 area and 0.063cm^3 volume. There is Fat Layer (Subcutaneous Tissue) exposed. There is no tunneling or undermining noted. There is a medium amount of serosanguineous drainage noted. The wound margin is flat and intact. There is large (67-100%) red granulation within the wound bed. There is a small (1-33%) amount of necrotic tissue within the wound bed including Adherent Slough. Assessment Active Problems  ICD-10 Laceration without foreign body, right lower leg, initial encounter Non-pressure chronic ulcer of other part of right lower leg with fat layer exposed Elevated blood-pressure reading, without diagnosis of hypertension Procedures Wound #1 Pre-procedure diagnosis of Wound #1 is a Trauma, Other located on the Right,Midline Lower Leg . There was a Excisional Skin/Subcutaneous Tissue Debridement with a total area of 0.06 sq cm performed by Allen Derry, PA-C. With the following instrument(s): Forceps to remove Viable and Non-Viable tissue/material. Material removed includes Subcutaneous Tissue and Slough and. A time out was conducted at 09:00, prior to the start of the procedure. A Minimum amount of bleeding was controlled with Pressure. The procedure was tolerated well. Post Debridement Measurements: 0.4cm length x 0.2cm width x 0.2cm depth; 0.013cm^3 volume. Character of Wound/Ulcer Post Debridement is stable. Post procedure Diagnosis Wound #1: Same as Pre-Procedure Plan Follow-up Appointments: Return Appointment in 1 week. Nurse Visit as needed Bathing/ Shower/ Hygiene: May shower with wound dressing protected with water repellent cover or cast protector. Anesthetic (Use 'Patient Medications' Section for Anesthetic Order Entry): Lidocaine applied to wound bed Additional Orders / Instructions: Follow Nutritious Diet and Increase Protein Intake Medications-Please add to medication list.: P.O. Antibiotics - finish antibiotic Topical  Antibiotic WOUND #1: - Lower Leg Wound Laterality: Right, Midline Cleanser: Vashe 5.8 (oz) 1 x Per Day/30 Days Discharge Instructions: Use vashe 5.8 (oz) as directed Topical: Mupirocin Ointment 1 x Per Day/30 Days Discharge Instructions: Apply as directed by provider. Prim Dressing: Prisma 4.34 (in) 1 x Per Day/30 Days ary Discharge Instructions: Moisten w/normal saline or sterile water; Cover wound as directed. put hydrogel over top of the collagen Prim Dressing: (Border) Zetuvit Plus Silicone Border Dressing 5x5 (in/in) (Generic) 1 x Per Day/30 Days ary Secured With: Tubigrip Size C, 2.75x10 (in/yd) 1 x Per Day/30 Days Discharge Instructions: Apply 3 Tubigrip C 3-finger-widths below knee to base of toes to secure dressing and/or for swelling. 1. I am good recommend that we have the patient continue to monitor for any signs of infection or worsening. Based on what I am seeing I do believe that the patient is doing very well and I am very pleased with where we stand today. 2. I am good recommend that the patient should continue with the wound care measures as before primarily although we Argun to switch to collagen and away ALEYCIA, AUGUSTIN ([id number]) [id number] Page 7 of 7 from the [facility] at this point as I do not feel like the [facility] is able to get to the base of the wound and the collagen will do much better in that regard. We will see patient back for reevaluation in 1 week here in the clinic. If anything worsens or changes patient will contact our office for additional recommendations. Electronic Signature(s) Signed: 11/15/2022 2:07:44 PM By: Allen Derry PA-C Entered By: Allen Derry on 11/15/2022 14:07:44 -------------------------------------------------------------------------------- SuperBill Details Patient Name: Date of Service: [name], [name] 11/15/2022 Medical Record Number: [id number] Patient Account Number: [id number] Date of  Birth/Sex: Treating RN: 05-03-1951 (72 y.o. Skip Mayer Primary Care Provider: Daniel Nones Other Clinician: Referring Provider: Treating Provider/Extender: Lois Huxley in Treatment: 4 Diagnosis Coding ICD-10 Codes Code Description [phone] Laceration without foreign body, right lower leg, initial encounter L97.812 Non-pressure chronic ulcer of other part of right lower leg with fat layer exposed R03.0 Elevated blood-pressure reading, without diagnosis of hypertension Facility Procedures : CPT4 Code: [id number] Description: 11042 - DEB SUBQ TISSUE 20 SQ CM/< ICD-10  Diagnosis Description L97.812 Non-pressure chronic ulcer of other part of right lower leg with fat layer expo Modifier: sed Quantity: 1 Physician Procedures : CPT4 Code Description Modifier [id number] 11042 - WC PHYS SUBQ TISS 20 SQ CM ICD-10 Diagnosis Description L97.812 Non-pressure chronic ulcer of other part of right lower leg with fat layer exposed Quantity: 1 Electronic Signature(s) Signed: 11/15/2022 2:16:28 PM By: Allen Derry PA-C Entered By: Allen Derry on 11/15/2022 14:16:28

## 2022-11-20 ENCOUNTER — Ambulatory Visit: Payer: PRIVATE HEALTH INSURANCE

## 2022-11-20 DIAGNOSIS — M5459 Other low back pain: Secondary | ICD-10-CM | POA: Diagnosis not present

## 2022-11-20 DIAGNOSIS — R2689 Other abnormalities of gait and mobility: Secondary | ICD-10-CM
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
## 2022-11-22 ENCOUNTER — Ambulatory Visit: Payer: PRIVATE HEALTH INSURANCE

## 2022-11-22 ENCOUNTER — Encounter: Payer: PRIVATE HEALTH INSURANCE | Admitting: Physician Assistant

## 2022-11-22 DIAGNOSIS — M5459 Other low back pain: Secondary | ICD-10-CM | POA: Diagnosis not present

## 2022-11-22 DIAGNOSIS — R2689 Other abnormalities of gait and mobility: Secondary | ICD-10-CM

## 2022-11-22 DIAGNOSIS — L97812 Non-pressure chronic ulcer of other part of right lower leg with fat layer exposed: Secondary | ICD-10-CM | POA: Diagnosis not present
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
## 2022-11-25 NOTE — Progress Notes (Addendum)
IYAUNA, SING ([id number]) [id number] Page 1 of 7 Visit Report for 11/22/2022 Arrival Information Details Patient Name: Date of Service: [name], [place] [name] 11/22/2022 9:30 A M Medical Record Number: [id number] Patient Account Number: [id number] Date of Birth/Sex: Treating RN: Jun 03, 1950 (72 y.o. Cathlean Cower, Kim Primary Care Lauren Modisette: Daniel Nones Other Clinician: Betha Loa Referring Maude Gloor: Treating Tell Rozelle/Extender: Lois Huxley in Treatment: 5 Visit Information History Since Last Visit Pain Present Now: No Patient Arrived: Ambulatory Arrival Time: 09:30 Transfer Assistance: None Patient Identification Verified: Yes Secondary Verification Process Completed: Yes Patient Requires Transmission-Based Precautions: No Patient Has Alerts: Yes Patient Alerts: ABI 11/01/22 AVVS R: 1.13 L:1.25 TBI R 0.64 L 0.92 Electronic Signature(s) Signed: 11/25/2022 10:38:51 AM By: Elliot Gurney, BSN, RN, CWS, Kim RN, BSN Entered By: Elliot Gurney, BSN, RN, CWS, Kim on 11/25/2022 10:38:51 -------------------------------------------------------------------------------- Encounter Discharge Information Details Patient Name: Date of Service: [name], [name] 11/22/2022 9:30 A M Medical Record Number: [id number] Patient Account Number: [id number] Date of Birth/Sex: Treating RN: [birth year] (72 y.o. Skip Mayer Primary Care Milanie Rosenfield: Daniel Nones Other Clinician: Betha Loa Referring Corina Stacy: Treating Jolie Strohecker/Extender: Lois Huxley in Treatment: 5 Encounter Discharge Information Items Post Procedure Vitals Discharge Condition: Stable Unable to obtain vitals Reason: limited time Ambulatory Status: Ambulatory Discharge Destination: Home Transportation: Private Auto Accompanied By: self Schedule Follow-up Appointment: No Clinical Summary of Care: Electronic Signature(s) KEYONIA, GLUTH ([id number]) [id number] Page 2 of  7 Signed: 11/25/2022 10:47:31 AM By: Elliot Gurney, BSN, RN, CWS, Kim RN, BSN Entered By: Elliot Gurney, BSN, RN, CWS, Kim on 11/25/2022 10:47:31 -------------------------------------------------------------------------------- General Visit Notes Details Patient Name: Date of Service: [name], [place] [name] 11/22/2022 9:30 A M Medical Record Number: [id number] Patient Account Number: [id number] Date of Birth/Sex: Treating RN: April 25, 1951 (72 y.o. Skip Mayer Primary Care Alara Daniel: Daniel Nones Other Clinician: Betha Loa Referring Thelonious Kauffmann: Treating Trysta Showman/Extender: Lois Huxley in Treatment: 5 Notes Late data entry due to national computer outage on Friday 11/22/2022. Electronic Signature(s) Signed: 11/25/2022 10:56:14 AM By: Elliot Gurney, BSN, RN, CWS, Kim RN, BSN Entered By: Elliot Gurney, BSN, RN, CWS, Kim on 11/25/2022 10:56:14 -------------------------------------------------------------------------------- Lower Extremity Assessment Details Patient Name: Date of Service: [name], [place] [name] 11/22/2022 9:30 A M Medical Record Number: [id number] Patient Account Number: [id number] Date of Birth/Sex: Treating RN: 1950/09/25 (72 y.o. Skip Mayer Primary Care Geniva Lohnes: Daniel Nones Other Clinician: Betha Loa Referring Ellean Firman: Treating Devean Skoczylas/Extender: [name] in Treatment: 5 Edema Assessment Assessed: Kyra Searles: No] Franne Forts: Yes] [Left: Edema] [Right: :] Vascular Assessment Pulses: Dorsalis Pedis Palpable: [Right:Yes] Electronic Signature(s) Signed: 11/25/2022 10:40:37 AM By: Elliot Gurney, BSN, RN, CWS, Kim RN, BSN Entered By: Elliot Gurney, BSN, RN, CWS, Kim on 11/25/2022 10:40:37 [place] [facility] ([id number]) [phone].pdf Page 3 of 7 -------------------------------------------------------------------------------- Multi Wound Chart Details Patient Name: Date of Service: [name], [place] [name] 11/22/2022 9:30 A M Medical Record Number: [id number] Patient  Account Number: [id number] Date of Birth/Sex: Treating RN: February 01, 1951 (72 y.o. Skip Mayer Primary Care Jhase Creppel: Daniel Nones Other Clinician: Betha Loa Referring [name]: Treating Daine Croker/Extender: Lois Huxley in Treatment: 5 Vital Signs Height(in): 63 Pulse(bpm): 77 Weight(lbs): 128 Blood Pressure(mmHg): 116/82 Body Mass Index(BMI): 22.7 Temperature(F): 97.5 Respiratory Rate(breaths/min): 16 [1:Photos: No Photos Right, Midline Lower Leg Wound Location: Trauma Wounding Event: Trauma, Other Primary Etiology: 10/08/2022 Date Acquired: 5 Weeks of Treatment: Open Wound Status: No Wound Recurrence: Yes Clustered Wound: 0.6x0.2x0.5 Measurements L x W x D (cm)  0.094 A (cm) : rea 0.047 Volume (cm) : 99.60% % Reduction in A rea: 99.40% % Reduction in Volume: Full Thickness With Exposed Support Classification: Structures Medium Exudate Amount: Serosanguineous Exudate Type: red, brown Exudate Color:] [N/A:N/A N/A  N/A N/A N/A N/A N/A N/A N/A N/A N/A N/A N/A N/A N/A N/A N/A N/A] Treatment Notes Electronic Signature(s) Signed: 11/25/2022 10:42:56 AM By: Elliot Gurney, BSN, RN, CWS, Kim RN, BSN Entered By: Elliot Gurney, BSN, RN, CWS, Kim on 11/25/2022 10:42:56 -------------------------------------------------------------------------------- Multi-Disciplinary Care Plan Details Patient Name: Date of Service: [name], [name] 11/22/2022 9:30 A M Medical Record Number: [id number] Patient Account Number: [id number] Date of Birth/Sex: Treating RN: 1951-02-06 (72 y.o. Skip Mayer Primary Care Rondell Pardon: Daniel Nones Other Clinician: Glynis, Hunsucker ([id number]) [id number] Page 4 of 7 Referring Marcelline Temkin: Treating Fatisha Rabalais/Extender: Lois Huxley in Treatment: 5 Active Inactive Soft Tissue Infection Nursing Diagnoses: Impaired tissue integrity Potential for infection: soft tissue Goals: Patient will remain free of wound  infection Date Initiated: 10/18/2022 Target Resolution Date: 10/18/2022 Goal Status: Active Patient/caregiver will verbalize understanding of or measures to prevent infection and contamination in the home setting Date Initiated: 10/18/2022 Date Inactivated: 11/01/2022 Target Resolution Date: 10/18/2022 Goal Status: Met Signs and symptoms of infection will be recognized early to allow for prompt treatment Date Initiated: 10/18/2022 Date Inactivated: 11/01/2022 Target Resolution Date: 10/18/2022 Goal Status: Met Interventions: Assess signs and symptoms of infection every visit Notes: Wound/Skin Impairment Nursing Diagnoses: Impaired tissue integrity Goals: Patient/caregiver will verbalize understanding of skin care regimen Date Initiated: 10/18/2022 Date Inactivated: 11/01/2022 Target Resolution Date: 10/18/2022 Goal Status: Met Ulcer/skin breakdown will have a volume reduction of 30% by week 4 Date Initiated: 10/18/2022 Date Inactivated: 11/25/2022 Target Resolution Date: 11/08/2022 Goal Status: Met Ulcer/skin breakdown will have a volume reduction of 50% by week 8 Date Initiated: 10/18/2022 Target Resolution Date: 12/06/2022 Goal Status: Active Ulcer/skin breakdown will have a volume reduction of 80% by week 12 Date Initiated: 10/18/2022 Target Resolution Date: 01/03/2023 Goal Status: Active Ulcer/skin breakdown will heal within 14 weeks Date Initiated: 10/18/2022 Target Resolution Date: 01/16/2023 Goal Status: Active Interventions: Assess patient/caregiver ability to obtain necessary supplies Assess patient/caregiver ability to perform ulcer/skin care regimen upon admission and as needed Assess ulceration(s) every visit Provide education on ulcer and skin care Treatment Activities: Referred to DME Tyeshia Cornforth for dressing supplies : 10/18/2022 Skin care regimen initiated : 10/18/2022 Notes: Electronic Signature(s) Signed: 11/25/2022 10:46:17 AM By: Elliot Gurney, BSN, RN, CWS, Kim RN, BSN Entered  By: Elliot Gurney, BSN, RN, CWS, Kim on 11/25/2022 10:46:17 [place] [facility] ([id number]) [id number].pdf Page 5 of 7 -------------------------------------------------------------------------------- Pain Assessment Details Patient Name: Date of Service: [name], [place] [name] 11/22/2022 9:30 A M Medical Record Number: [id number] Patient Account Number: [id number] Date of Birth/Sex: Treating RN: 04/16/1951 (72 y.o. Skip Mayer Primary Care Dereka Lueras: Daniel Nones Other Clinician: Betha Loa Referring Thane Age: Treating Chrisean Kloth/Extender: Lois Huxley in Treatment: 5 Active Problems Location of Pain Severity and Description of Pain Patient Has Paino No Site Locations Pain Management and Medication Current Pain Management: Electronic Signature(s) Signed: 11/25/2022 10:39:48 AM By: Elliot Gurney, BSN, RN, CWS, Kim RN, BSN Entered By: Elliot Gurney, BSN, RN, CWS, Kim on 11/25/2022 10:39:47 -------------------------------------------------------------------------------- Patient/Caregiver Education Details Patient Name: Date of Service: [name], D[place] NNA [facility] 7/19/2024andnbsp9:30 A M Medical Record Number: [id number] Patient Account Number: [id number] Date of Birth/Gender: Treating RN: 1951-01-27 (72 y.o. Skip Mayer Primary Care Physician: Daniel Nones Other Clinician: Betha Loa Referring  Physician: Treating Physician/Extender: Lois Huxley in Treatment: 5 RUPINDER, LIVINGSTON J ([id number]) [id number] Page 6 of 7 Education Assessment Education Provided To: Patient Education Topics Provided Wound Debridement: Handouts: Wound Debridement Methods: Demonstration, Explain/Verbal Responses: State content correctly Electronic Signature(s) Signed: 11/26/2022 8:06:11 AM By: Elliot Gurney, BSN, RN, CWS, Kim RN, BSN Entered By: Elliot Gurney, BSN, RN, CWS, Kim on 11/25/2022  10:46:38 -------------------------------------------------------------------------------- Wound Assessment Details Patient Name: Date of Service: [name], [name] 11/22/2022 9:30 A M Medical Record Number: [id number] Patient Account Number: [id number] Date of Birth/Sex: Treating RN: [birth year] (72 y.o. Cathlean Cower, Kim Primary Care Delane Wessinger: Daniel Nones Other Clinician: Betha Loa Referring Armanii Pressnell: Treating Rollin Kotowski/Extender: [name] in Treatment: 5 Wound Status Wound Number: 1 Primary Etiology: Trauma, Other Wound Location: Right, Midline Lower Leg Wound Status: Open Wounding Event: Trauma Date Acquired: 10/08/2022 Weeks Of Treatment: 5 Clustered Wound: Yes Wound Measurements Length: (cm) 0.6 Width: (cm) 0.2 Depth: (cm) 0.5 Area: (cm) 0.094 Volume: (cm) 0.047 % Reduction in Area: 99.6% % Reduction in Volume: 99.4% Wound Description Classification: Full Thickness With Exposed Support Exudate Amount: Medium Exudate Type: Serosanguineous Exudate Color: red, brown Structures Electronic Signature(s) Signed: 11/26/2022 8:06:11 AM By: Elliot Gurney, BSN, RN, CWS, Kim RN, BSN Entered By: Elliot Gurney, BSN, RN, CWS, Kim on 11/25/2022 10:40:03 [place] [facility] ([id number]) [phone].pdf Page 7 of 7 -------------------------------------------------------------------------------- Vitals Details Patient Name: Date of Service: [name], [place] [name] 11/22/2022 9:30 A M Medical Record Number: [id number] Patient Account Number: [id number] Date of Birth/Sex: Treating RN: 03/13/1951 (72 y.o. Cathlean Cower, Kim Primary Care Harlie Ragle: Daniel Nones Other Clinician: Betha Loa Referring Kenzleigh Sedam: Treating Syaire Saber/Extender: Lois Huxley in Treatment: 5 Vital Signs Time Taken: 09:30 Temperature (F): 97.5 Height (in): 63 Pulse (bpm): 77 Weight (lbs): 128 Respiratory Rate (breaths/min): 16 Body Mass Index (BMI): 22.7 Blood Pressure (mmHg):  116/82 Reference Range: 80 - 120 mg / dl Electronic Signature(s) Signed: 11/25/2022 10:39:39 AM By: Elliot Gurney, BSN, RN, CWS, Kim RN, BSN Entered By: Elliot Gurney, BSN, RN, CWS, Kim on 11/25/2022 10:39:39

## 2022-11-25 NOTE — Progress Notes (Signed)
YEIRA, GULDEN ([id number]) [id number] Page 1 of 1 Visit Report for 11/22/2022 Fall Risk Assessment Details Patient Name: Date of Service: [name], [name]. 11/22/2022 9:30 A M Medical Record Number: [id number] Patient Account Number: [id number] Date of Birth/Sex: Treating RN: Aug 11, 1950 (72 y.o. 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 History of falling - immediate or within 3 months 25 Yes Secondary diagnosis (Do you have 2 or more medical diagnoseso) 0 No Ambulatory aid None/bed rest/wheelchair/nurse 0 Yes Crutches/cane/walker 0 No Furniture 0 No Intravenous therapy Access/Saline/Heparin Lock 0 No Gait/Transferring Normal/ bed rest/ wheelchair 0 Yes Weak (short steps with or without shuffle, stooped but able to lift head while walking, may seek 0 No support from furniture) Impaired (short steps with shuffle, may have difficulty arising from chair, head down, impaired 0 No balance) Mental Status Oriented to own ability 0 Yes Electronic Signature(s) Signed: 11/25/2022 10:41:21 AM By: Elliot Gurney, BSN, RN, CWS, Kim RN, BSN Entered By: Elliot Gurney, BSN, RN, CWS, Kim on 11/25/2022 10:41:21

## 2022-11-26 NOTE — Progress Notes (Signed)
[name] ([id number]) [phone].pdf Page 1 of 8 Visit Report for 11/15/2022 Arrival Information Details Patient Name: Date of Service: [name], [place] [name] 11/15/2022 8:30 A M Medical Record Number: [id number] Patient Account Number: [id number] Date of Birth/Sex: Treating RN: 1950/10/24 (72 y.o. Skip Mayer Primary Care Matika Bartell: Daniel Nones Other Clinician: Referring Araceli Coufal: Treating Yoshiko Keleher/Extender: Lois Huxley in Treatment: 4 Visit Information History Since Last Visit Added or deleted any medications: No Patient Arrived: Ambulatory Has Dressing in Place as Prescribed: Yes Arrival Time: 08:45 Pain Present Now: Yes Accompanied By: self Transfer Assistance: None Patient Identification Verified: Yes Secondary Verification Process Completed: Yes Patient Requires Transmission-Based No Precautions: Patient Has Alerts: Yes Patient Alerts: 6/14Non-compressible >220 Electronic Signature(s) Signed: 11/26/2022 8:06:11 AM By: Elliot Gurney, BSN, RN, CWS, Kim RN, BSN Entered By: Elliot Gurney, BSN, RN, CWS, Kim on 11/15/2022 08:46:23 -------------------------------------------------------------------------------- Encounter Discharge Information Details Patient Name: Date of Service: [name], [name] 11/15/2022 8:30 A M Medical Record Number: [id number] Patient Account Number: [id number] Date of Birth/Sex: Treating RN: 26-Aug-1950 (72 y.o. Skip Mayer Primary Care Kaylan Yates: Daniel Nones Other Clinician: Referring Nehemie Casserly: Treating Audrinna Sherman/Extender: Lois Huxley in Treatment: 4 Encounter Discharge Information Items Post Procedure Vitals Discharge Condition: Stable Unable to obtain vitals Reason: limited time Ambulatory Status: Ambulatory Discharge Destination: Home Transportation: Private Auto Accompanied By: self Schedule Follow-up Appointment: Yes Clinical Summary of Care: [name], [name] ([id number])  [id number] Page 2 of 8 Electronic Signature(s) Signed: 11/15/2022 2:16:46 PM By: Elliot Gurney, BSN, RN, CWS, Kim RN, BSN Entered By: Elliot Gurney, BSN, RN, CWS, Kim on 11/15/2022 14:16:46 -------------------------------------------------------------------------------- Lower Extremity Assessment Details Patient Name: Date of Service: [name], [place] [name] 11/15/2022 8:30 A M Medical Record Number: [id number] Patient Account Number: [id number] Date of Birth/Sex: Treating RN: 11-26-1950 (72 y.o. Skip Mayer Primary Care Joann Jorge: Daniel Nones Other Clinician: Referring Harnoor Kohles: Treating Zael Shuman/Extender: [name] in Treatment: 4 Edema Assessment Assessed: [Left: No] [Right: No] [Left: Edema] [Right: :] Calf Left: Right: Point of Measurement: 31 cm From Medial Instep 31 cm Ankle Left: Right: Point of Measurement: 8 cm From Medial Instep 19 cm Vascular Assessment Pulses: Dorsalis Pedis Palpable: [Right:Yes] Electronic Signature(s) Signed: 11/26/2022 8:06:11 AM By: Elliot Gurney, BSN, RN, CWS, Kim RN, BSN Entered By: Elliot Gurney, BSN, RN, CWS, Kim on 11/15/2022 08:54:30 -------------------------------------------------------------------------------- Multi Wound Chart Details Patient Name: Date of Service: [name], [name] 11/15/2022 8:30 A M Medical Record Number: [id number] Patient Account Number: [id number] Date of Birth/Sex: Treating RN: 1951/01/25 (72 y.o. Skip Mayer Primary Care Devonta Blanford: Daniel Nones Other Clinician: Referring Dixie Jafri: Treating Kathey Simer/Extender: Lois Huxley in Treatment: 4 Vital Signs [name], [name] ([id number]) [id number] Page 3 of 8 Height(in): 63 Pulse(bpm): 64 Weight(lbs): 128 Blood Pressure(mmHg): 115/74 Body Mass Index(BMI): 22.7 Temperature(F): 97.7 Respiratory Rate(breaths/min): 16 [1:Photos:] [N/A:N/A] Right, Midline Lower Leg N/A N/A Wound Location: Trauma N/A  N/A Wounding Event: Trauma, Other N/A N/A Primary Etiology: Chronic sinus problems/congestion N/A N/A Comorbid History: 10/08/2022 N/A N/A Date Acquired: 4 N/A N/A Weeks of Treatment: Open N/A N/A Wound Status: No N/A N/A Wound Recurrence: Yes N/A N/A Clustered Wound: 3 N/A N/A Clustered Quantity: 4x0.2x0.1 N/A N/A Measurements L x W x D (cm) 0.628 N/A N/A A (cm) : rea 0.063 N/A N/A Volume (cm) : 97.50% N/A N/A % Reduction in A rea: 99.20% N/A N/A % Reduction in Volume: Full Thickness With Exposed Support N/A N/A Classification: Structures Medium N/A N/A Exudate Amount:  Serosanguineous N/A N/A Exudate Type: red, brown N/A N/A Exudate Color: Flat and Intact N/A N/A Wound Margin: Large (67-100%) N/A N/A Granulation Amount: Red N/A N/A Granulation Quality: Small (1-33%) N/A N/A Necrotic Amount: Fat Layer (Subcutaneous Tissue): Yes N/A N/A Exposed Structures: Fascia: No Tendon: No Muscle: No Joint: No Bone: No Small (1-33%) N/A N/A Epithelialization: Treatment Notes Electronic Signature(s) Signed: 11/26/2022 8:06:11 AM By: Elliot Gurney, BSN, RN, CWS, Kim RN, BSN Entered By: Elliot Gurney, BSN, RN, CWS, Kim on 11/15/2022 08:59:32 -------------------------------------------------------------------------------- Multi-Disciplinary Care Plan Details Patient Name: Date of Service: [name], [name] 11/15/2022 8:30 A M Medical Record Number: [id number] Patient Account Number: [id number] Date of Birth/Sex: Treating RN: July 02, 1950 (72 y.o. Skip Mayer Primary Care Blayne Frankie: Daniel Nones Other Clinician: Referring Goldye Tourangeau: Treating Amilcar Reever/Extender: Lois Huxley in Treatment: 4 [name], [name] ([id number]) [id number] Page 4 of 8 Active Inactive Soft Tissue Infection Nursing Diagnoses: Impaired tissue integrity Potential for infection: soft tissue Goals: Patient will remain free of wound infection Date Initiated:  10/18/2022 Target Resolution Date: 10/18/2022 Goal Status: Active Patient/caregiver will verbalize understanding of or measures to prevent infection and contamination in the home setting Date Initiated: 10/18/2022 Date Inactivated: 11/01/2022 Target Resolution Date: 10/18/2022 Goal Status: Met Signs and symptoms of infection will be recognized early to allow for prompt treatment Date Initiated: 10/18/2022 Date Inactivated: 11/01/2022 Target Resolution Date: 10/18/2022 Goal Status: Met Interventions: Assess signs and symptoms of infection every visit Notes: Wound/Skin Impairment Nursing Diagnoses: Impaired tissue integrity Goals: Patient/caregiver will verbalize understanding of skin care regimen Date Initiated: 10/18/2022 Date Inactivated: 11/01/2022 Target Resolution Date: 10/18/2022 Goal Status: Met Ulcer/skin breakdown will have a volume reduction of 30% by week 4 Date Initiated: 10/18/2022 Target Resolution Date: 11/08/2022 Goal Status: Active Ulcer/skin breakdown will have a volume reduction of 50% by week 8 Date Initiated: 10/18/2022 Target Resolution Date: 12/06/2022 Goal Status: Active Ulcer/skin breakdown will have a volume reduction of 80% by week 12 Date Initiated: 10/18/2022 Target Resolution Date: 01/03/2023 Goal Status: Active Ulcer/skin breakdown will heal within 14 weeks Date Initiated: 10/18/2022 Target Resolution Date: 01/16/2023 Goal Status: Active Interventions: Assess patient/caregiver ability to obtain necessary supplies Assess patient/caregiver ability to perform ulcer/skin care regimen upon admission and as needed Assess ulceration(s) every visit Provide education on ulcer and skin care Treatment Activities: Referred to DME Inza Mikrut for dressing supplies : 10/18/2022 Skin care regimen initiated : 10/18/2022 Notes: Electronic Signature(s) Signed: 11/15/2022 2:17:15 PM By: Elliot Gurney, BSN, RN, CWS, Kim RN, BSN Entered By: Elliot Gurney, BSN, RN, CWS, Kim on 11/15/2022  14:17:14 [name] ([id number]) [id number] Page 5 of 8 -------------------------------------------------------------------------------- Pain Assessment Details Patient Name: Date of Service: [name], [place] [name] 11/15/2022 8:30 A M Medical Record Number: [id number] Patient Account Number: [id number] Date of Birth/Sex: Treating RN: 07-12-1950 (72 y.o. Skip Mayer Primary Care Javarius Tsosie: Daniel Nones Other Clinician: Referring Izreal Kock: Treating Paxten Appelt/Extender: Lois Huxley in Treatment: 4 Active Problems Location of Pain Severity and Description of Pain Patient Has Paino Yes Site Locations Pain Location: Pain in Ulcers With Dressing Change: Yes Rate the pain. Current Pain Level: 3 Character of Pain Describe the Pain: Tender Pain Management and Medication Current Pain Management: Electronic Signature(s) Signed: 11/26/2022 8:06:11 AM By: Elliot Gurney, BSN, RN, CWS, Kim RN, BSN Entered By: Elliot Gurney, BSN, RN, CWS, Kim on 11/15/2022 08:49:17 -------------------------------------------------------------------------------- Patient/Caregiver Education Details Patient Name: Date of Service: [name], DIA NNA Shela Commons 7/12/2024andnbsp8:30 A M Medical Record Number: [id number] Patient Account Number: [id number] Date of  Birth/Gender: Treating RN: 1950/08/12 (72 y.o. Skip Mayer Primary Care Physician: Daniel Nones Other Clinician: [place] Prince ([id number]) [id number] Page 6 of 8 Referring Physician: Treating Physician/Extender: Lois Huxley in Treatment: 4 Education Assessment Education Provided To: Patient Education Topics Provided Wound Debridement: Handouts: Wound Debridement Methods: Explain/Verbal Responses: State content correctly Wound/Skin Impairment: Handouts: Caring for Your Ulcer Methods: Explain/Verbal Responses: State content correctly Electronic Signature(s) Signed: 11/26/2022 8:06:11 AM  By: Elliot Gurney, BSN, RN, CWS, Kim RN, BSN Entered By: Elliot Gurney, BSN, RN, CWS, Kim on 11/15/2022 14:17:35 -------------------------------------------------------------------------------- Wound Assessment Details Patient Name: Date of Service: [name], [name] 11/15/2022 8:30 A M Medical Record Number: [id number] Patient Account Number: [id number] Date of Birth/Sex: Treating RN: Oct 29, 1950 (72 y.o. Cathlean Cower, Kim Primary Care Epimenio Schetter: Daniel Nones Other Clinician: Referring Duard Spiewak: Treating Taysom Glymph/Extender: [name] in Treatment: 4 Wound Status Wound Number: 1 Primary Etiology: Trauma, Other Wound Location: Right, Midline Lower Leg Wound Status: Open Wounding Event: Trauma Comorbid History: Chronic sinus problems/congestion Date Acquired: 10/08/2022 Weeks Of Treatment: 4 Clustered Wound: Yes Photos Wound Measurements Length: (cm) Width: (cm) [name], [name] ([id number]) Depth: (cm) Clustered Quantity: Area: (cm) Volume: (cm) 4 % Reduction in Area: 97.5% 0.2 % Reduction in Volume: 99.2% [id number] Page 7 of 8 0.1 Epithelialization: Small (1-33%) 3 Tunneling: No 0.628 Undermining: No 0.063 Wound Description Classification: Full Thickness With Exposed Suppo Wound Margin: Flat and Intact Exudate Amount: Medium Exudate Type: Serosanguineous Exudate Color: red, brown rt Structures Foul Odor After Cleansing: No Slough/Fibrino Yes Wound Bed Granulation Amount: Large (67-100%) Exposed Structure Granulation Quality: Red Fascia Exposed: No Necrotic Amount: Small (1-33%) Fat Layer (Subcutaneous Tissue) Exposed: Yes Necrotic Quality: Adherent Slough Tendon Exposed: No Muscle Exposed: No Joint Exposed: No Bone Exposed: No Treatment Notes Wound #1 (Lower Leg) Wound Laterality: Right, Midline Cleanser Vashe 5.8 (oz) Discharge Instruction: Use vashe 5.8 (oz) as directed Peri-Wound Care Topical Mupirocin Ointment Discharge  Instruction: Apply as directed by Jasmeet Manton. Primary Dressing Prisma 4.34 (in) Discharge Instruction: Moisten w/normal saline or sterile water; Cover wound as directed. put hydrogel over top of the collagen (Border) Zetuvit Plus Silicone Border Dressing 5x5 (in/in) Secondary Dressing Secured With Tubigrip Size C, 2.75x10 (in/yd) Discharge Instruction: Apply 3 Tubigrip C 3-finger-widths below knee to base of toes to secure dressing and/or for swelling. Compression Wrap Compression Stockings Facilities manager) Signed: 11/26/2022 8:06:11 AM By: Elliot Gurney, BSN, RN, CWS, Kim RN, BSN Entered By: Elliot Gurney, BSN, RN, CWS, Kim on 11/15/2022 08:53:41 -------------------------------------------------------------------------------- Vitals Details Patient Name: Date of Service: [name], [name] 11/15/2022 8:30 A M Medical Record Number: [id number] Patient Account Number: [id number] Date of Birth/Sex: Treating RN: [birth year] (72 y.o. Skip Mayer Primary Care Brocha Gilliam: Daniel Nones Other Clinician:  [place] ([id number]) [id number] Page 8 of 8 Referring Rakeya Glab: Treating Juquan Reznick/Extender: Lois Huxley in Treatment: 4 Vital Signs Time Taken: 08:46 Temperature (F): 97.7 Height (in): 63 Pulse (bpm): 64 Weight (lbs): 128 Respiratory Rate (breaths/min): 16 Body Mass Index (BMI): 22.7 Blood Pressure (mmHg): 115/74 Reference Range: 80 - 120 mg / dl Electronic Signature(s) Signed: 11/26/2022 8:06:11 AM By: Elliot Gurney, BSN, RN, CWS, Kim RN, BSN Entered By: Elliot Gurney, BSN, RN, CWS, Kim on 11/15/2022 [phone]

## 2022-11-27 ENCOUNTER — Ambulatory Visit: Payer: PRIVATE HEALTH INSURANCE | Admitting: Physical Therapy

## 2022-11-27 DIAGNOSIS — M5459 Other low back pain: Secondary | ICD-10-CM | POA: Diagnosis not present

## 2022-11-27 DIAGNOSIS — R2689 Other abnormalities of gait and mobility: Secondary | ICD-10-CM
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
## 2022-11-28 NOTE — Progress Notes (Addendum)
[name], GREENBERGER ([id number]) [id number] Page 1 of 7 Visit Report for 11/22/2022 Chief Complaint Document Details Patient Name: Date of Service: [name], [place] [name] 11/22/2022 9:30 A M Medical Record Number: [id number] Patient Account Number: [id number] Date of Birth/Sex: Treating RN: May 14, 1950 (72 y.o. Skip Mayer Primary Care Provider: Daniel Nones Other Clinician: Referring Provider: Treating Provider/Extender: Lois Huxley in Treatment: 5 Information Obtained from: Patient Chief Complaint Right LE ulcer Electronic Signature(s) Signed: 12/02/2022 8:10:01 AM By: Allen Derry PA-C Entered By: Allen Derry on 12/02/2022 08:10:01 -------------------------------------------------------------------------------- Debridement Details Patient Name: Date of Service: [name], [name] 11/22/2022 9:30 A M Medical Record Number: [id number] Patient Account Number: [id number] Date of Birth/Sex: Treating RN: 1950/11/14 (72 y.o. Cathlean Cower, Kim Primary Care Provider: Daniel Nones Other Clinician: Betha Loa Referring Provider: Treating Provider/Extender: Lois Huxley in Treatment: 5 Debridement Performed for Assessment: Wound #1 Right,Midline Lower Leg Performed By: Physician Allen Derry, PA-C Debridement Type: Debridement Level of Consciousness (Pre-procedure): Awake and Alert Pre-procedure Verification/Time Out Yes - 09:45 Taken: Pain Control: Lidocaine Percent of Wound Bed Debrided: 100% T Area Debrided (cm): otal 0.14 Tissue and other material debrided: Viable, Non-Viable, Slough, Subcutaneous, Slough Level: Skin/Subcutaneous Tissue Debridement Description: Excisional Instrument: Curette Bleeding: Minimum Hemostasis Achieved: Pressure Response to Treatment: Procedure was tolerated well Level of Consciousness (Post- Awake and Alert procedure): MELVENIA, FAVELA ([id number]) [phone].pdf Page 2 of  7 Post Debridement Measurements of Total Wound Length: (cm) 0.6 Width: (cm) 0.3 Depth: (cm) 0.6 Volume: (cm) 0.085 Character of Wound/Ulcer Post Debridement: Stable Post Procedure Diagnosis Same as Pre-procedure Electronic Signature(s) Signed: 11/25/2022 10:44:11 AM By: Elliot Gurney, BSN, RN, CWS, Kim RN, BSN Signed: 11/28/2022 4:06:59 PM By: Allen Derry PA-C Entered By: Elliot Gurney, BSN, RN, CWS, Kim on 11/25/2022 10:44:11 -------------------------------------------------------------------------------- HPI Details Patient Name: Date of Service: [name], [name] 11/22/2022 9:30 A M Medical Record Number: [id number] Patient Account Number: [id number] Date of Birth/Sex: Treating RN: 07-26-1950 (72 y.o. Skip Mayer Primary Care Provider: Daniel Nones Other Clinician: Referring Provider: Treating Provider/Extender: Lois Huxley in Treatment: 5 History of Present Illness HPI Description: 10-18-2022 patient presents today for initial inspection here in our clinic concerning issues that she has been having with a wound on her right anterior lower extremity. Unfortunately the patient had an issue where she was setting up a table and the table was leg had gotten stuck. When she was trying to dislodge it came loose too quickly and she ended up actually falling and this caused a significant issue with a skin tear on the right shin among other things including hitting her head and hurting her neck as well. Fortunately I do not see any signs overall of infection right now but there is definitely some need for sharp debridement in regard to the necrotic tissue at this point on the right anterior shin. 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 claim. Patient has an elevated blood pressure reading today but no diagnosis of hypertension she has whitecoat syndrome otherwise she is a fairly healthy individual as far as wound healing is concerned. 6/19; patient was admitted to our  clinic last week on 6/14 for traumatic leg wound. Upon having her dressing changed today it was noted she was having more serous drainage from the Opening in the center Of the wound and patient was concerned for infection and came in to be evaluated. Currently she denies fever/chills, increased warmth or erythema  to the surrounding skin or Purulent drainage. She has been using collagen and Xeroform to the wound beds. 10-25-2022 upon evaluation today patient's wound actually appears to be doing much better at this time. Fortunately I do not see any signs of active infection locally or systemically which is good news that she actually seems to be better than apparently she was on Wednesday. Nonetheless she still has the center area that keeps collecting fluid and sealing up and then subsequently draining I think if we keep her swelling under control this will alleviate a lot of this issue in general. I discussed that with her today. 11-01-2022 upon evaluation today patient appears to be doing well currently in regard to her wound. She has been tolerating the dressing changes without complication. Fortunately there does not appear to be any signs of active infection locally nor systemically which is great news. I am actually extremely pleased with where things stand at this point. 11-08-2022 upon evaluation today patient appears to be doing well currently in regard to her wound. She has been tolerating the dressing changes without complication. Fortunately there does not appear to be any signs of active infection locally nor systemically at this time which is great news. No fevers, chills, nausea, vomiting, or diarrhea. 11-15-2022 upon evaluation today patient's wound is actually showing signs of excellent improvement. There is just a very small area that is remaining open at this time and in general I think that we are making good headway towards closure. I do not see any signs of active infection locally or  systemically which is great news. 11-22-2022 this is a note that is being put in late secondary to the fact that computers were down across the world due to a [employer] outage. Therefore there may be some lack of detail compared to what would normally be present in the notes. With that being said the patient's wound actually showed signs of excellent improvement at this point. Fortunately I do not see any evidence of worsening overall and I do believe that she is making good headway towards complete closure which is great news. No fevers, chills, nausea, vomiting, or diarrhea. Electronic Signature(s) Signed: 12/02/2022 8:10:14 AM By: Allen Derry PA-C Entered By: Allen Derry on 12/02/2022 08:10:14 [place] ([id number]) [id number] Page 3 of 7 -------------------------------------------------------------------------------- Physical Exam Details Patient Name: Date of Service: [name], [place] [name] 11/22/2022 9:30 A M Medical Record Number: [id number] Patient Account Number: [id number] Date of Birth/Sex: Treating RN: [birth year] (72 y.o. Skip Mayer Primary Care Provider: Daniel Nones Other Clinician: Referring Provider: Treating Provider/Extender: [name] in Treatment: 5 Constitutional Well-nourished and well-hydrated in no acute distress. Respiratory normal breathing without difficulty. Psychiatric this patient is able to make decisions and demonstrates good insight into disease process. Alert and Oriented x 3. pleasant and cooperative. Notes Patient's wound did require some sharp debridement clearway some necrotic debris she tolerated this debridement today without complication postdebridement wound bed appears to be doing significantly better which is great news. Electronic Signature(s) Signed: 12/02/2022 8:10:47 AM By: Allen Derry PA-C Entered By: Allen Derry on 12/02/2022  08:10:47 -------------------------------------------------------------------------------- Physician Orders Details Patient Name: Date of Service: [name], [name] 11/22/2022 9:30 A M Medical Record Number: [id number] Patient Account Number: [id number] Date of Birth/Sex: Treating RN: 09-24-1950 (72 y.o. Skip Mayer Primary Care Provider: Daniel Nones Other Clinician: Betha Loa Referring Provider: Treating Provider/Extender: Lois Huxley in Treatment: 5 Verbal / Phone Orders: No  Diagnosis Coding Follow-up Appointments Return Appointment in 1 week. Nurse Visit as needed Bathing/ Shower/ Hygiene May shower; gently cleanse wound with antibacterial soap, rinse and pat dry prior to dressing wounds Anesthetic (Use 'Patient Medications' Section for Anesthetic Order Entry) MAKYNLEIGH, BRESLIN ([id number]) [id number] Page 4 of 7 Lidocaine applied to wound bed Additional Orders / Instructions Follow Nutritious Diet and Increase Protein Intake Medications-Please add to medication list. ntibiotics - finish antibiotic P.O. A Topical A ntibiotic Wound Treatment Wound #1 - Lower Leg Wound Laterality: Right, Midline Cleanser: Vashe 5.8 (oz) 1 x Per Day/30 Days Discharge Instructions: Use vashe 5.8 (oz) as directed Topical: Mupirocin Ointment 1 x Per Day/30 Days Discharge Instructions: Apply as directed by provider. Prim Dressing: Endoform Natural, Non-fenestrated, 2x2 (in/in) ary 1 x Per Day/30 Days Prim Dressing: (Border) Zetuvit Plus Silicone Border Dressing 5x5 (in/in) (Generic) 1 x Per Day/30 Days ary Secondary Dressing: (BORDER) Zetuvit Plus SILICONE BORDER Dressing 4x4 (in/in) 1 x Per Day/30 Days Discharge Instructions: Please do not put silicone bordered dressings under wraps. Use non-bordered dressing only. Secured With: Tubigrip Size C, 2.75x10 (in/yd) 1 x Per Day/30 Days Discharge Instructions: Apply 3 Tubigrip C 3-finger-widths below  knee to base of toes to secure dressing and/or for swelling. Electronic Signature(s) Signed: 11/25/2022 10:45:21 AM By: Elliot Gurney, BSN, RN, CWS, Kim RN, BSN Signed: 11/28/2022 4:06:59 PM By: Allen Derry PA-C Entered By: Elliot Gurney BSN, RN, CWS, Kim on 11/25/2022 10:45:20 -------------------------------------------------------------------------------- Problem List Details Patient Name: Date of Service: [name], [name] 11/22/2022 9:30 A M Medical Record Number: [id number] Patient Account Number: [id number] Date of Birth/Sex: Treating RN: 04-21-1951 (72 y.o. Skip Mayer Primary Care Provider: Daniel Nones Other Clinician: Referring Provider: Treating Provider/Extender: Lois Huxley in Treatment: 5 Active Problems ICD-10 Encounter Code Description Active Date MDM Diagnosis S81.811A Laceration without foreign body, right lower leg, initial encounter 10/18/2022 No Yes L97.812 Non-pressure chronic ulcer of other part of right lower leg with fat layer 10/18/2022 No Yes exposed R03.0 Elevated blood-pressure reading, without diagnosis of hypertension 10/18/2022 No Yes ADALYN, PENNOCK ([id number]) [phone].pdf Page 5 of 7 Inactive Problems Resolved Problems Electronic Signature(s) Signed: 12/02/2022 8:09:58 AM By: Allen Derry PA-C Entered By: Allen Derry on 12/02/2022 08:09:58 -------------------------------------------------------------------------------- Progress Note Details Patient Name: Date of Service: [name], [name] 11/22/2022 9:30 A M Medical Record Number: [id number] Patient Account Number: [id number] Date of Birth/Sex: Treating RN: [birth year] (72 y.o. Skip Mayer Primary Care Provider: Daniel Nones Other Clinician: Referring Provider: Treating Provider/Extender: Lois Huxley in Treatment: 5 Subjective Chief Complaint Information obtained from Patient Right LE ulcer History of Present Illness (HPI) 10-18-2022 patient  presents today for initial inspection here in our clinic concerning issues that she has been having with a wound on her right anterior lower extremity. Unfortunately the patient had an issue where she was setting up a table and the table was leg had gotten stuck. When she was trying to dislodge it came loose too quickly and she ended up actually falling and this caused a significant issue with a skin tear on the right shin among other things including hitting her head and hurting her neck as well. Fortunately I do not see any signs overall of infection right now but there is definitely some need for sharp debridement in regard to the necrotic tissue at this point on the right anterior shin. 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 claim. Patient has an elevated  blood pressure reading today but no diagnosis of hypertension she has whitecoat syndrome otherwise she is a fairly healthy individual as far as wound healing is concerned. 6/19; patient was admitted to our clinic last week on 6/14 for traumatic leg wound. Upon having her dressing changed today it was noted she was having more serous drainage from the Opening in the center Of the wound and patient was concerned for infection and came in to be evaluated. Currently she denies fever/chills, increased warmth or erythema to the surrounding skin or Purulent drainage. She has been using collagen and Xeroform to the wound beds. 10-25-2022 upon evaluation today patient's wound actually appears to be doing much better at this time. Fortunately I do not see any signs of active infection locally or systemically which is good news that she actually seems to be better than apparently she was on Wednesday. Nonetheless she still has the center area that keeps collecting fluid and sealing up and then subsequently draining I think if we keep her swelling under control this will alleviate a lot of this issue in general. I discussed that with her  today. 11-01-2022 upon evaluation today patient appears to be doing well currently in regard to her wound. She has been tolerating the dressing changes without complication. Fortunately there does not appear to be any signs of active infection locally nor systemically which is great news. I am actually extremely pleased with where things stand at this point. 11-08-2022 upon evaluation today patient appears to be doing well currently in regard to her wound. She has been tolerating the dressing changes without complication. Fortunately there does not appear to be any signs of active infection locally nor systemically at this time which is great news. No fevers, chills, nausea, vomiting, or diarrhea. 11-15-2022 upon evaluation today patient's wound is actually showing signs of excellent improvement. There is just a very small area that is remaining open at this time and in general I think that we are making good headway towards closure. I do not see any signs of active infection locally or systemically which is great news. 11-22-2022 this is a note that is being put in late secondary to the fact that computers were down across the world due to a [employer] outage. Therefore there may be some lack of detail compared to what would normally be present in the notes. With that being said the patient's wound actually showed signs of excellent improvement at this point. Fortunately I do not see any evidence of worsening overall and I do believe that she is making good headway towards complete closure which is great news. No fevers, chills, nausea, vomiting, or diarrhea. [address] NATALLY, RIBERA ([id number]) [phone].pdf Page 6 of 7 Constitutional Well-nourished and well-hydrated in no acute distress. Vitals Time Taken: 9:30 AM, Height: 63 in, Weight: 128 lbs, BMI: 22.7, Temperature: 97.5 F, Pulse: 77 bpm, Respiratory Rate: 16 breaths/min, Blood Pressure: 116/82  mmHg. Respiratory normal breathing without difficulty. Psychiatric this patient is able to make decisions and demonstrates good insight into disease process. Alert and Oriented x 3. pleasant and cooperative. General Notes: Patient's wound did require some sharp debridement clearway some necrotic debris she tolerated this debridement today without complication postdebridement wound bed appears to be doing significantly better which is great news. Integumentary (Hair, Skin) Wound #1 status is Open. Original cause of wound was Trauma. The date acquired was: 10/08/2022. The wound has been in treatment 5 weeks. The wound is located on the Right,Midline Lower Leg. The wound  measures 0.6cm length x 0.2cm width x 0.5cm depth; 0.094cm^2 area and 0.047cm^3 volume. There is a medium amount of serosanguineous drainage noted. Assessment Active Problems ICD-10 Laceration without foreign body, right lower leg, initial encounter Non-pressure chronic ulcer of other part of right lower leg with fat layer exposed Elevated blood-pressure reading, without diagnosis of hypertension Procedures Wound #1 Pre-procedure diagnosis of Wound #1 is a Trauma, Other located on the Right,Midline Lower Leg . There was a Excisional Skin/Subcutaneous Tissue Debridement with a total area of 0.14 sq cm performed by Allen Derry, PA-C. With the following instrument(s): Curette to remove Viable and Non-Viable tissue/material. Material removed includes Subcutaneous Tissue and Slough and after achieving pain control using Lidocaine. No specimens were taken. A time out was conducted at 09:45, prior to the start of the procedure. A Minimum amount of bleeding was controlled with Pressure. The procedure was tolerated well. Post Debridement Measurements: 0.6cm length x 0.3cm width x 0.6cm depth; 0.085cm^3 volume. Character of Wound/Ulcer Post Debridement is stable. Post procedure Diagnosis Wound #1: Same as Pre-Procedure Plan Follow-up  Appointments: Return Appointment in 1 week. Nurse Visit as needed Bathing/ Shower/ Hygiene: May shower; gently cleanse wound with antibacterial soap, rinse and pat dry prior to dressing wounds Anesthetic (Use 'Patient Medications' Section for Anesthetic Order Entry): Lidocaine applied to wound bed Additional Orders / Instructions: Follow Nutritious Diet and Increase Protein Intake Medications-Please add to medication list.: P.O. Antibiotics - finish antibiotic Topical Antibiotic WOUND #1: - Lower Leg Wound Laterality: Right, Midline Cleanser: Vashe 5.8 (oz) 1 x Per Day/30 Days Discharge Instructions: Use vashe 5.8 (oz) as directed Topical: Mupirocin Ointment 1 x Per Day/30 Days Discharge Instructions: Apply as directed by provider. Prim Dressing: Endoform Natural, Non-fenestrated, 2x2 (in/in) 1 x Per Day/30 Days ary Prim Dressing: (Border) Zetuvit Plus Silicone Border Dressing 5x5 (in/in) (Generic) 1 x Per Day/30 Days ary Secondary Dressing: (BORDER) Zetuvit Plus SILICONE BORDER Dressing 4x4 (in/in) 1 x Per Day/30 Days Discharge Instructions: Please do not put silicone bordered dressings under wraps. Use non-bordered dressing only. Secured With: Tubigrip Size C, 2.75x10 (in/yd) 1 x Per Day/30 Days Discharge Instructions: Apply 3 Tubigrip C 3-finger-widths below knee to base of toes to secure dressing and/or for swelling. 1. Based on what I am seeing I do believe that we should continue with dressing changes regularly at this point I do believe however we will get a switch over to using endoform to give this a shot and see how that we will do. 2. Also can use the Zetuvit over top. KAHLEA, COBERT ([id number]) [id number] Page 7 of 7 3. Also to continue with the Tubigrip for the time being. We will see patient back for reevaluation in 1 week here in the clinic. If anything worsens or changes patient will contact our office for  additional recommendations. Electronic Signature(s) Signed: 12/02/2022 8:11:11 AM By: Allen Derry PA-C Entered By: Allen Derry on 12/02/2022 08:11:11 -------------------------------------------------------------------------------- SuperBill Details Patient Name: Date of Service: [name], [name] 11/22/2022 Medical Record Number: [id number] Patient Account Number: [id number] Date of Birth/Sex: Treating RN: 1950/09/28 (72 y.o. Skip Mayer Primary Care Provider: Daniel Nones Other Clinician: Betha Loa Referring Provider: Treating Provider/Extender: Lois Huxley in Treatment: 5 Diagnosis Coding ICD-10 Codes Code Description [phone] Laceration without foreign body, right lower leg, initial encounter L97.812 Non-pressure chronic ulcer of other part of right lower leg with fat layer exposed R03.0 Elevated blood-pressure reading, without diagnosis of hypertension Facility Procedures : CPT4 Code: [id number]  Description: 11042 - DEB SUBQ TISSUE 20 SQ CM/< ICD-10 Diagnosis Description L97.812 Non-pressure chronic ulcer of other part of right lower leg with fat layer expo Modifier: sed Quantity: 1 Physician Procedures : CPT4 Code Description Modifier [id number] 11042 - WC PHYS SUBQ TISS 20 SQ CM ICD-10 Diagnosis Description L97.812 Non-pressure chronic ulcer of other part of right lower leg with fat layer exposed Quantity: 1 Electronic Signature(s) Signed: 12/02/2022 8:11:25 AM By: Allen Derry PA-C Previous Signature: 11/25/2022 10:56:38 AM Version By: Elliot Gurney, BSN, RN, CWS, Kim RN, BSN Previous Signature: 11/28/2022 4:06:59 PM Version By: Allen Derry PA-C Entered By: Allen Derry on 12/02/2022 08:11:25

## 2022-11-29 ENCOUNTER — Ambulatory Visit: Payer: PRIVATE HEALTH INSURANCE

## 2022-11-29 ENCOUNTER — Encounter: Payer: PRIVATE HEALTH INSURANCE | Admitting: Internal Medicine

## 2022-11-29 DIAGNOSIS — R2689 Other abnormalities of gait and mobility: Secondary | ICD-10-CM

## 2022-11-29 DIAGNOSIS — M5459 Other low back pain: Secondary | ICD-10-CM | POA: Diagnosis not present

## 2022-11-29 DIAGNOSIS — L97812 Non-pressure chronic ulcer of other part of right lower leg with fat layer exposed: Secondary | ICD-10-CM | POA: Diagnosis not present
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
## 2022-12-02 ENCOUNTER — Ambulatory Visit: Payer: PRIVATE HEALTH INSURANCE

## 2022-12-02 DIAGNOSIS — R262 Difficulty in walking, not elsewhere classified: Secondary | ICD-10-CM

## 2022-12-02 DIAGNOSIS — M5459 Other low back pain: Secondary | ICD-10-CM

## 2022-12-02 DIAGNOSIS — M6281 Muscle weakness (generalized): Secondary | ICD-10-CM

## 2022-12-02 NOTE — Progress Notes (Signed)
[name], [name] ([id number]) [id number] Page 1 of 9 Visit Report for 11/29/2022 Arrival Information Details Patient Name: Date of Service: [name], [place] [name]. 11/29/2022 9:30 A M Medical Record Number: [id number] Patient Account Number: [id number] Date of Birth/Sex: Treating RN: 04-11-1951 (72 y.o. Skip Mayer Primary Care Ashey Tramontana: Daniel Nones Other Clinician: Referring Gibbs Naugle: Treating Danner Paulding/Extender: RO BSO Dorris Carnes, MICHA EL Moshe Salisbury Weeks in Treatment: 6 Visit Information History Since Last Visit Added or deleted any medications: No Patient Arrived: Ambulatory Has Dressing in Place as Prescribed: Yes Arrival Time: 09:36 Pain Present Now: Yes Accompanied By: self Transfer Assistance: None Patient Identification Verified: Yes Secondary Verification Process Completed: Yes Patient Requires Transmission-Based Precautions: No Patient Has Alerts: Yes Patient Alerts: ABI 11/01/22 AVVS R: 1.13 L:1.25 TBI R 0.64 L 0.92 Electronic Signature(s) Signed: 12/02/2022 3:52:01 PM By: Elliot Gurney, BSN, RN, CWS, Kim RN, BSN Entered By: Elliot Gurney, BSN, RN, CWS, Kim on 11/29/2022 09:42:07 -------------------------------------------------------------------------------- Clinic Level of Care Assessment Details Patient Name: Date of Service: [name], DIA [name]. 11/29/2022 9:30 A M Medical Record Number: [id number] Patient Account Number: [id number] Date of Birth/Sex: Treating RN: [birth year] (72 y.o. Skip Mayer Primary Care Exie Chrismer: Daniel Nones Other Clinician: Referring Tyaire Odem: Treating Angelle Isais/Extender: Chauncey Mann, MICHA EL Moshe Salisbury Weeks in Treatment: 6 Clinic Level of Care Assessment Items TOOL 4 Quantity Score []  - 0 Use when only an EandM is performed on FOLLOW-UP visit ASSESSMENTS - Nursing Assessment / Reassessment X- 1 10 Reassessment of Co-morbidities (includes updates in patient status) X- 1 5 Reassessment of Adherence to Treatment Plan ASSESSMENTS - Wound  and Skin A ssessment / Reassessment X - Simple Wound Assessment / Reassessment - one wound 1 5 [name], [name] ([id number]) [id number] Page 2 of 9 []  - 0 Complex Wound Assessment / Reassessment - multiple wounds []  - 0 Dermatologic / Skin Assessment (not related to wound area) ASSESSMENTS - Focused Assessment []  - 0 Circumferential Edema Measurements - multi extremities []  - 0 Nutritional Assessment / Counseling / Intervention []  - 0 Lower Extremity Assessment (monofilament, tuning fork, pulses) []  - 0 Peripheral Arterial Disease Assessment (using hand held doppler) ASSESSMENTS - Ostomy and/or Continence Assessment and Care []  - 0 Incontinence Assessment and Management []  - 0 Ostomy Care Assessment and Management (repouching, etc.) PROCESS - Coordination of Care X - Simple Patient / Family Education for ongoing care 1 15 []  - 0 Complex (extensive) Patient / Family Education for ongoing care X- 1 10 Staff obtains Chiropractor, Records, T Results / Process Orders est []  - 0 Staff telephones HHA, Nursing Homes / Clarify orders / etc []  - 0 Routine Transfer to another Facility (non-emergent condition) []  - 0 Routine Hospital Admission (non-emergent condition) []  - 0 New Admissions / Manufacturing engineer / Ordering NPWT Apligraf, etc. , []  - 0 Emergency Hospital Admission (emergent condition) X- 1 10 Simple Discharge Coordination []  - 0 Complex (extensive) Discharge Coordination PROCESS - Special Needs []  - 0 Pediatric / Minor Patient Management []  - 0 Isolation Patient Management []  - 0 Hearing / Language / Visual special needs []  - 0 Assessment of Community assistance (transportation, D/C planning, etc.) []  - 0 Additional assistance / Altered mentation []  - 0 Support Surface(s) Assessment (bed, cushion, seat, etc.) INTERVENTIONS - Wound Cleansing / Measurement X - Simple Wound Cleansing - one wound 1 5 []  - 0 Complex Wound Cleansing -  multiple wounds X- 1 5 Wound Imaging (photographs - any number of wounds) []  -  0 Wound Tracing (instead of photographs) X- 1 5 Simple Wound Measurement - one wound []  - 0 Complex Wound Measurement - multiple wounds INTERVENTIONS - Wound Dressings []  - 0 Small Wound Dressing one or multiple wounds X- 1 15 Medium Wound Dressing one or multiple wounds []  - 0 Large Wound Dressing one or multiple wounds []  - 0 Application of Medications - topical []  - 0 Application of Medications - injection INTERVENTIONS - Miscellaneous []  - 0 External ear exam []  - 0 Specimen Collection (cultures, biopsies, blood, body fluids, etc.) [name], [name] ([id number]) [id number] Page 3 of 9 []  - 0 Specimen(s) / Culture(s) sent or taken to Lab for analysis []  - 0 Patient Transfer (multiple staff / Michiel Sites Lift / Similar devices) []  - 0 Simple Staple / Suture removal (25 or less) []  - 0 Complex Staple / Suture removal (26 or more) []  - 0 Hypo / Hyperglycemic Management (close monitor of Blood Glucose) []  - 0 Ankle / Brachial Index (ABI) - do not check if billed separately X- 1 5 Vital Signs Has the patient been seen at the hospital within the last three years: Yes Total Score: 90 Level Of Care: New/Established - Level 3 Electronic Signature(s) Signed: 12/02/2022 3:52:01 PM By: Elliot Gurney, BSN, RN, CWS, Kim RN, BSN Entered By: Elliot Gurney, BSN, RN, CWS, Kim on 11/29/2022 10:13:58 -------------------------------------------------------------------------------- Encounter Discharge Information Details Patient Name: Date of Service: [name], DIA [name]. 11/29/2022 9:30 A M Medical Record Number: [id number] Patient Account Number: [id number] Date of Birth/Sex: Treating RN: 27-Feb-1951 (72 y.o. Skip Mayer Primary Care Johnathan Tortorelli: Daniel Nones Other Clinician: Referring Riley Papin: Treating Manual Navarra/Extender: RO BSO Dorris Carnes, MICHA EL [place] Bells in Treatment: 6 Encounter Discharge  Information Items Discharge Condition: Stable Ambulatory Status: Ambulatory Discharge Destination: Home Transportation: Private Auto Schedule Follow-up Appointment: Yes Clinical Summary of Care: Electronic Signature(s) Signed: 12/02/2022 3:52:01 PM By: Elliot Gurney, BSN, RN, CWS, Kim RN, BSN Entered By: Elliot Gurney, BSN, RN, CWS, Kim on 11/29/2022 10:16:15 -------------------------------------------------------------------------------- Lower Extremity Assessment Details Patient Name: Date of Service: [name], [place] [name]. 11/29/2022 9:30 A M Medical Record Number: [id number] Patient Account Number: [id number] Date of Birth/Sex: Treating RN: [birth year] (72 y.o. Skip Mayer Primary Care Myrla Malanowski: Daniel Nones Other Clinician: [place] Prince ([id number]) [id number] Page 4 of 9 Referring Holland Nickson: Treating Docia Klar/Extender: RO BSO N, MICHA EL Moshe Salisbury Weeks in Treatment: 6 Vascular Assessment Pulses: Dorsalis Pedis Palpable: [Right:Yes] Extremity colors, hair growth, and conditions: Extremity Color: [Right:Mottled] Hair Growth on Extremity: [Right:Yes] Temperature of Extremity: [Right:Warm < 3 seconds] Toe Nail Assessment Left: Right: Thick: No Discolored: No Deformed: No Improper Length and Hygiene: No Electronic Signature(s) Signed: 12/02/2022 3:52:01 PM By: Elliot Gurney, BSN, RN, CWS, Kim RN, BSN Entered By: Elliot Gurney, BSN, RN, CWS, Kim on 11/29/2022 09:51:09 -------------------------------------------------------------------------------- Multi Wound Chart Details Patient Name: Date of Service: [name], DIA [name]. 11/29/2022 9:30 A M Medical Record Number: [id number] Patient Account Number: [id number] Date of Birth/Sex: Treating RN: [birth year] (72 y.o. Skip Mayer Primary Care Chiyo Fay: Daniel Nones Other Clinician: Referring Athea Haley: Treating Brenlyn Beshara/Extender: RO BSO N, MICHA EL Moshe Salisbury Weeks in Treatment: 6 Vital Signs Height(in): 63 Pulse(bpm):  79 Weight(lbs): 128 Blood Pressure(mmHg): 151/99 Body Mass Index(BMI): 22.7 Temperature(F): 98.4 Respiratory Rate(breaths/min): 16 [1:Photos:] [N/A:N/A] Right, Midline Lower Leg N/A N/A Wound Location: Trauma N/A N/A Wounding Event: Trauma, Other N/A N/A Primary Etiology: Chronic sinus problems/congestion N/A N/A Comorbid History: 10/08/2022 N/A N/A Date Acquired: 6 N/A N/A Weeks of Treatment:  Open N/A N/A Wound Status: No N/A N/A Wound Recurrence: [name], [name] ([id number]) [phone].pdf Page 5 of 9 Yes N/A N/A Clustered Wound: 0.5x0.2x0.5 N/A N/A Measurements L x W x D (cm) 0.079 N/A N/A A (cm) : rea 0.039 N/A N/A Volume (cm) : 99.70% N/A N/A % Reduction in A rea: 99.50% N/A N/A % Reduction in Volume: 7 Starting Position 1 (o'clock): 1 Ending Position 1 (o'clock): 0.6 Maximum Distance 1 (cm): Yes N/A N/A Undermining: Full Thickness With Exposed Support N/A N/A Classification: Structures Medium N/A N/A Exudate Amount: Serosanguineous N/A N/A Exudate Type: red, brown N/A N/A Exudate Color: Medium (34-66%) N/A N/A Granulation Amount: Red N/A N/A Granulation Quality: Medium (34-66%) N/A N/A Necrotic Amount: Fat Layer (Subcutaneous Tissue): Yes N/A N/A Exposed Structures: Fascia: No Tendon: No Muscle: No Joint: No Bone: No Treatment Notes Electronic Signature(s) Signed: 12/02/2022 3:52:01 PM By: Elliot Gurney, BSN, RN, CWS, Kim RN, BSN Entered By: Elliot Gurney, BSN, RN, CWS, Kim on 11/29/2022 10:11:20 -------------------------------------------------------------------------------- Multi-Disciplinary Care Plan Details Patient Name: Date of Service: [name], DIA [name]. 11/29/2022 9:30 A M Medical Record Number: [id number] Patient Account Number: [id number] Date of Birth/Sex: Treating RN: 11-28-1950 (72 y.o. Skip Mayer Primary Care Abiola Behring: Daniel Nones Other Clinician: Referring Macauley Mossberg: Treating Nariyah Osias/Extender: RO BSO N, MICHA EL  Moshe Salisbury Weeks in Treatment: 6 Active Inactive Wound/Skin Impairment Nursing Diagnoses: Impaired tissue integrity Goals: Patient/caregiver will verbalize understanding of skin care regimen Date Initiated: 10/18/2022 Date Inactivated: 11/01/2022 Target Resolution Date: 10/18/2022 Goal Status: Met Ulcer/skin breakdown will have a volume reduction of 30% by week 4 Date Initiated: 10/18/2022 Date Inactivated: 11/25/2022 Target Resolution Date: 11/08/2022 Goal Status: Met Ulcer/skin breakdown will have a volume reduction of 50% by week 8 Date Initiated: 10/18/2022 Date Inactivated: 11/29/2022 Target Resolution Date: 12/06/2022 Goal Status: Met Ulcer/skin breakdown will have a volume reduction of 80% by week 12 Date Initiated: 10/18/2022 Target Resolution Date: 01/03/2023 Goal Status: Active Ulcer/skin breakdown will heal within 14 weeks [name], [name] ([id number]) [id number].pdf Page 6 of 9 Date Initiated: 10/18/2022 Target Resolution Date: 01/16/2023 Goal Status: Active Interventions: Assess patient/caregiver ability to obtain necessary supplies Assess patient/caregiver ability to perform ulcer/skin care regimen upon admission and as needed Assess ulceration(s) every visit Provide education on ulcer and skin care Treatment Activities: Referred to DME Raechel Marcos for dressing supplies : 10/18/2022 Skin care regimen initiated : 10/18/2022 Notes: Electronic Signature(s) Signed: 12/02/2022 3:52:01 PM By: Elliot Gurney, BSN, RN, CWS, Kim RN, BSN Entered By: Elliot Gurney, BSN, RN, CWS, Kim on 11/29/2022 10:14:38 -------------------------------------------------------------------------------- Pain Assessment Details Patient Name: Date of Service: [name], DIA [name]. 11/29/2022 9:30 A M Medical Record Number: [id number] Patient Account Number: [id number] Date of Birth/Sex: Treating RN: Apr 17, 1951 (72 y.o. Skip Mayer Primary Care Magdala Brahmbhatt: Daniel Nones Other Clinician: Referring  Jakorian Marengo: Treating Marshella Tello/Extender: RO BSO N, MICHA EL Moshe Salisbury Weeks in Treatment: 6 Active Problems Location of Pain Severity and Description of Pain Patient Has Paino Yes Site Locations Pain Location: Generalized Pain Rate the pain. Current Pain Level: 3 Worst Pain Level: 5 Character of Pain Describe the Pain: Heavy, Shooting Pain Management and Medication Current Pain Management: Electronic Signature(s) Signed: 12/02/2022 3:52:01 PM By: Elliot Gurney, BSN, RN, CWS, Kim RN, BSN [name] ([id number]) [id number] Page 7 of 9 Entered By: Elliot Gurney, BSN, RN, CWS, Kim on 11/29/2022 09:43:19 -------------------------------------------------------------------------------- Patient/Caregiver Education Details Patient Name: Date of Service: [name], DIA [place] 7/26/2024andnbsp9:30 A M Medical Record Number: [id number] Patient Account Number: [id number] Date of  Birth/Gender: Treating RN: [birth year] (72 y.o. Skip Mayer Primary Care Physician: Daniel Nones Other Clinician: Referring Physician: Treating Physician/Extender: RO BSO Dorris Carnes, MICHA EL [place]ells in Treatment: 6 Education Assessment Education Provided To: Patient Education Topics Provided Wound/Skin Impairment: Handouts: Caring for Your Ulcer, Other: Continue wound care as prescribed Methods: Explain/Verbal Responses: State content correctly Electronic Signature(s) Signed: 12/02/2022 3:52:01 PM By: Elliot Gurney, BSN, RN, CWS, Kim RN, BSN Entered By: Elliot Gurney, BSN, RN, CWS, Kim on 11/29/2022 10:15:03 -------------------------------------------------------------------------------- Wound Assessment Details Patient Name: Date of Service: [name], DIA [name]. 11/29/2022 9:30 A M Medical Record Number: [id number] Patient Account Number: [id number] Date of Birth/Sex: Treating RN: Nov 13, 1950 (72 y.o. Skip Mayer Primary Care Etola Mull: Daniel Nones Other Clinician: Referring Ahmarion Saraceno: Treating  Jozie Wulf/Extender: RO BSO N, MICHA EL Moshe Salisbury Weeks in Treatment: 6 Wound Status Wound Number: 1 Primary Etiology: Trauma, Other Wound Location: Right, Midline Lower Leg Wound Status: Open Wounding Event: Trauma Comorbid History: Chronic sinus problems/congestion Date Acquired: 10/08/2022 Weeks Of Treatment: 6 Clustered Wound: Yes Photos [name], [name] ([id number]) [phone].pdf Page 8 of 9 Wound Measurements Length: (cm) 0.5 Width: (cm) 0.2 Depth: (cm) 0.5 Area: (cm) 0.079 Volume: (cm) 0.039 % Reduction in Area: 99.7% % Reduction in Volume: 99.5% Tunneling: No Undermining: Yes Starting Position (o'clock): 7 Ending Position (o'clock): 1 Maximum Distance: (cm) 0.6 Wound Description Classification: Full Thickness With Exposed Suppo Exudate Amount: Medium Exudate Type: Serosanguineous Exudate Color: red, brown rt Structures Wound Bed Granulation Amount: Medium (34-66%) Exposed Structure Granulation Quality: Red Fascia Exposed: No Necrotic Amount: Medium (34-66%) Fat Layer (Subcutaneous Tissue) Exposed: Yes Necrotic Quality: Adherent Slough Tendon Exposed: No Muscle Exposed: No Joint Exposed: No Bone Exposed: No Treatment Notes Wound #1 (Lower Leg) Wound Laterality: Right, Midline Cleanser Vashe 5.8 (oz) Discharge Instruction: Use vashe 5.8 (oz) as directed Peri-Wound Care Topical Primary Dressing Endoform Natural, Non-fenestrated, 2x2 (in/in) Secondary Dressing (BORDER) Zetuvit Plus SILICONE BORDER Dressing 4x4 (in/in) Discharge Instruction: Please do not put silicone bordered dressings under wraps. Use non-bordered dressing only. Secured With Tubigrip Size C, 2.75x10 (in/yd) Discharge Instruction: Apply 3 Tubigrip C 3-finger-widths below knee to base of toes to secure dressing and/or for swelling. Compression Wrap Compression Stockings Add-Ons Electronic Signature(s) Signed: 12/02/2022 3:52:01 PM By: Elliot Gurney, BSN, RN, CWS, Kim RN,  BSN Entered By: Elliot Gurney, BSN, RN, CWS, Kim on 11/29/2022 09:50:00 [name] ([id number]) [id number] Page 9 of 9 -------------------------------------------------------------------------------- Vitals Details Patient Name: Date of Service: [name], [place] [name]. 11/29/2022 9:30 A M Medical Record Number: [id number] Patient Account Number: [id number] Date of Birth/Sex: Treating RN: 1950/11/29 (72 y.o. Cathlean Cower, Kim Primary Care Sybol Morre: Daniel Nones Other Clinician: Referring Rabecka Brendel: Treating Broedy Osbourne/Extender: RO BSO N, MICHA EL Moshe Salisbury Weeks in Treatment: 6 Vital Signs Time Taken: 09:42 Temperature (F): 98.4 Height (in): 63 Pulse (bpm): 79 Weight (lbs): 128 Respiratory Rate (breaths/min): 16 Body Mass Index (BMI): 22.7 Blood Pressure (mmHg): 151/99 Reference Range: 80 - 120 mg / dl Electronic Signature(s) Signed: 12/02/2022 3:52:01 PM By: Elliot Gurney, BSN, RN, CWS, Kim RN, BSN Entered By: Elliot Gurney, BSN, RN, CWS, Kim on 11/29/2022 09:42:52
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
## 2022-12-02 NOTE — Progress Notes (Signed)
SHERIAL, DYAS ([id number]) [id number] Page 1 of 5 Visit Report for 11/29/2022 HPI Details Patient Name: Date of Service: [name], [place] [name] 11/29/2022 9:30 A M Medical Record Number: [id number] Patient Account Number: [id number] Date of Birth/Sex: Treating RN: Nov 13, 1950 (72 y.o. Skip Mayer Primary Care Provider: Daniel Nones Other Clinician: Referring Provider: Treating Provider/Extender: Chauncey Mann, MICHA EL Moshe [place] Weeks in Treatment: 6 History of Present Illness HPI Description: 10-18-2022 patient presents today for initial inspection here in our clinic concerning issues that she has been having with a wound on her right anterior lower extremity. Unfortunately the patient had an issue where she was setting up a table and the table was leg had gotten stuck. When she was trying to dislodge it came loose too quickly and she ended up actually falling and this caused a significant issue with a skin tear on the right shin among other things including hitting her head and hurting her neck as well. Fortunately I do not see any signs overall of infection right now but there is definitely some need for sharp debridement in regard to the necrotic tissue at this point on the right anterior shin. 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 claim. Patient has an elevated blood pressure reading today but no diagnosis of hypertension she has whitecoat syndrome otherwise she is a fairly healthy individual as far as wound healing is concerned. 6/19; patient was admitted to our clinic last week on 6/14 for traumatic leg wound. Upon having her dressing changed today it was noted she was having more serous drainage from the Opening in the center Of the wound and patient was concerned for infection and came in to be evaluated. Currently she denies fever/chills, increased warmth or erythema to the surrounding skin or Purulent drainage. She has been using collagen  and Xeroform to the wound beds. 10-25-2022 upon evaluation today patient's wound actually appears to be doing much better at this time. Fortunately I do not see any signs of active infection locally or systemically which is good news that she actually seems to be better than apparently she was on Wednesday. Nonetheless she still has the center area that keeps collecting fluid and sealing up and then subsequently draining I think if we keep her swelling under control this will alleviate a lot of this issue in general. I discussed that with her today. 11-01-2022 upon evaluation today patient appears to be doing well currently in regard to her wound. She has been tolerating the dressing changes without complication. Fortunately there does not appear to be any signs of active infection locally nor systemically which is great news. I am actually extremely pleased with where things stand at this point. 11-08-2022 upon evaluation today patient appears to be doing well currently in regard to her wound. She has been tolerating the dressing changes without complication. Fortunately there does not appear to be any signs of active infection locally nor systemically at this time which is great news. No fevers, chills, nausea, vomiting, or diarrhea. 11-15-2022 upon evaluation today patient's wound is actually showing signs of excellent improvement. There is just a very small area that is remaining open at this time and in general I think that we are making good headway towards closure. I do not see any signs of active infection locally or systemically which is great news. 7/26; traumatic wound on the right anterior mid tibia with. We changed to endoform last week patient is changing the  dressing herself. 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 11/29/2022 9:30 A M Medical Record Number: [id number] Patient Account Number: [id number] Date of Birth/Sex: Treating RN: 1950/08/23 (72 y.o. Skip Mayer Primary Care Provider: Daniel Nones Other Clinician: Referring Provider: Treating Provider/Extender: RO BSO Dorris Carnes, MICHA EL Moshe Salisbury Weeks in Treatment: 6 Constitutional Patient is hypertensive.. Pulse regular and within target range for patient.Marland Kitchen Respirations regular, non-labored and within target range.. Temperature is normal and within the target range for the patient.Marland Kitchen appears in no distress. Notes Wound exam; under illumination very tiny open wound with a clean granulated base. No evidence of surrounding infection. No uncontrolled edema Electronic Signature(s) Signed: 11/29/2022 2:26:28 PM By: Baltazar Najjar MD Entered By: Baltazar Najjar on 11/29/2022 11:04:22 -------------------------------------------------------------------------------- Physician Orders Details Patient Name: Date of Service: [name], Meredith [name] 11/29/2022 9:30 A M Medical Record Number: [id number] Patient Account Number: [id number] Date of Birth/Sex: Treating RN: 04/17/1951 (72 y.o. Skip Mayer Primary Care Provider: Daniel Nones Other Clinician: Referring Provider: Treating Provider/Extender: RO BSO Dorris Carnes, MICHA EL [place] in Treatment: 6 Verbal / Phone Orders: No Diagnosis Coding Follow-up Appointments Return Appointment in 1 week. Nurse Visit as needed Bathing/ Shower/ Hygiene May shower; gently cleanse wound with antibacterial soap, rinse and pat dry prior to dressing wounds Anesthetic (Use 'Patient Medications' Section for Anesthetic Order Entry) Lidocaine applied to wound bed Additional Orders / Instructions Follow  Nutritious Diet and Increase Protein Intake Wound Treatment Wound #1 - Lower Leg Wound Laterality: Right, Midline Cleanser: Vashe 5.8 (oz) 1 x Per Day/30 Days Discharge Instructions: Use vashe 5.8 (oz) as directed Prim Dressing: Endoform Natural, Non-fenestrated, 2x2 (in/in) ary 1 x Per Day/30 Days Secondary Dressing: (BORDER) Zetuvit Plus SILICONE BORDER Dressing 4x4 (in/in) 1 x Per Day/30 Days Discharge Instructions: Please do not put silicone bordered dressings under wraps. Use non-bordered dressing only. Secured With: Tubigrip Size C, 2.75x10 (in/yd) 1 x Per Day/30 Days Discharge Instructions: Apply 3 Tubigrip C 3-finger-widths below knee to base of toes to secure dressing and/or for swelling. Electronic Signature(s) Signed: 11/29/2022 2:26:28 PM By: Baltazar Najjar MD Signed: 12/02/2022 3:52:01 PM By: Elliot Gurney BSN, RN, CWS, Kim RN, BSN [place] Prince ([id number]) [id number] Page 3 of 5 Signed: 12/02/2022 3:52:01 PM By: Elliot Gurney, BSN, RN, CWS, Kim RN, BSN Entered By: Elliot Gurney, BSN, RN, CWS, Kim on 11/29/2022 10:12:14 -------------------------------------------------------------------------------- Problem List Details Patient Name: Date of Service: [name], Meredith [name] 11/29/2022 9:30 A M Medical Record Number: [id number] Patient Account Number: [id number] Date of Birth/Sex: Treating RN: 02/04/1951 (72 y.o. Skip Mayer Primary Care Provider: Daniel Nones Other Clinician: Referring Provider: Treating Provider/Extender: Chauncey Mann, MICHA EL Moshe [place]is[place] Weeks in Treatment: 6 Active Problems ICD-10 Encounter Code Description Active Date MDM Diagnosis S81.811A Laceration without foreign body, right lower leg, initial encounter 10/18/2022 No Yes L97.812 Non-pressure chronic ulcer of other part of right lower leg with fat layer 10/18/2022 No Yes exposed R03.0 Elevated blood-pressure reading, without diagnosis of hypertension 10/18/2022 No Yes Inactive Problems Resolved  Problems Electronic Signature(s) Signed: 11/29/2022 2:26:28 PM By: Baltazar Najjar MD Entered By: Baltazar Najjar on 11/29/2022 11:02:29 -------------------------------------------------------------------------------- Progress Note Details Patient Name: Date of Service: [name], Meredith [name] 11/29/2022 9:30 A  M Medical Record Number: [id number] Patient Account Number: [id number] Date of Birth/Sex: Treating RN: [birth year] (72 y.o. Skip Mayer Primary Care Provider: Daniel Nones Other Clinician: Referring Provider: Treating Provider/Extender: Chauncey Mann, MICHA EL [place] in Treatment: 6 [name], [name] ([id number]) [id number] Page 4 of 5 Subjective History of Present Illness (HPI) 10-18-2022 patient presents today for initial inspection here in our clinic concerning issues that she has been having with a wound on her right anterior lower extremity. Unfortunately the patient had an issue where she was setting up a table and the table was leg had gotten stuck. When she was trying to dislodge it came loose too quickly and she ended up actually falling and this caused a significant issue with a skin tear on the right shin among other things including hitting her head and hurting her neck as well. Fortunately I do not see any signs overall of infection right now but there is definitely some need for sharp debridement in regard to the necrotic tissue at this point on the right anterior shin. 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 claim. Patient has an elevated blood pressure reading today but no diagnosis of hypertension she has whitecoat syndrome otherwise she is a fairly healthy individual as far as wound healing is concerned. 6/19; patient was admitted to our clinic last week on 6/14 for traumatic leg wound. Upon having her dressing changed today it was noted she was having more serous drainage from the Opening in the center Of the wound and  patient was concerned for infection and came in to be evaluated. Currently she denies fever/chills, increased warmth or erythema to the surrounding skin or Purulent drainage. She has been using collagen and Xeroform to the wound beds. 10-25-2022 upon evaluation today patient's wound actually appears to be doing much better at this time. Fortunately I do not see any signs of active infection locally or systemically which is good news that she actually seems to be better than apparently she was on Wednesday. Nonetheless she still has the center area that keeps collecting fluid and sealing up and then subsequently draining I think if we keep her swelling under control this will alleviate a lot of this issue in general. I discussed that with her today. 11-01-2022 upon evaluation today patient appears to be doing well currently in regard to her wound. She has been tolerating the dressing changes without complication. Fortunately there does not appear to be any signs of active infection locally nor systemically which is great news. I am actually extremely pleased with where things stand at this point. 11-08-2022 upon evaluation today patient appears to be doing well currently in regard to her wound. She has been tolerating the dressing changes without complication. Fortunately there does not appear to be any signs of active infection locally nor systemically at this time which is great news. No fevers, chills, nausea, vomiting, or diarrhea. 11-15-2022 upon evaluation today patient's wound is actually showing signs of excellent improvement. There is just a very small area that is remaining open at this time and in general I think that we are making good headway towards closure. I do not see any signs of active infection locally or systemically which is great news. 7/26; traumatic wound on the right anterior mid tibia with. We changed to endoform last week patient is changing the dressing herself. This was  initially traumatic with an accident at work Objective Constitutional Patient is hypertensive.. Pulse regular  and within target range for patient.Marland Kitchen Respirations regular, non-labored and within target range.. Temperature is normal and within the target range for the patient.Marland Kitchen appears in no distress. Vitals Time Taken: 9:42 AM, Height: 63 in, Weight: 128 lbs, BMI: 22.7, Temperature: 98.4 F, Pulse: 79 bpm, Respiratory Rate: 16 breaths/min, Blood Pressure: 151/99 mmHg. General Notes: Wound exam; under illumination very tiny open wound with a clean granulated base. No evidence of surrounding infection. No uncontrolled edema Integumentary (Hair, Skin) Wound #1 status is Open. Original cause of wound was Trauma. The date acquired was: 10/08/2022. The wound has been in treatment 6 weeks. The wound is located on the Right,Midline Lower Leg. The wound measures 0.5cm length x 0.2cm width x 0.5cm depth; 0.079cm^2 area and 0.039cm^3 volume. There is Fat Layer (Subcutaneous Tissue) exposed. There is no tunneling noted, however, there is undermining starting at 7:00 and ending at 1:00 with a maximum distance of 0.6cm. There is a medium amount of serosanguineous drainage noted. There is medium (34-66%) red granulation within the wound bed. There is a medium (34-66%) amount of necrotic tissue within the wound bed including Adherent Slough. Assessment Active Problems ICD-10 Laceration without foreign body, right lower leg, initial encounter Non-pressure chronic ulcer of other part of right lower leg with fat layer exposed Elevated blood-pressure reading, without diagnosis of hypertension Plan Follow-up Appointments: Return Appointment in 1 week. Nurse Visit as needed [name], [name] ([id number]) [id number] Page 5 of 5 Bathing/ Shower/ Hygiene: May shower; gently cleanse wound with antibacterial soap, rinse and pat dry prior to dressing wounds Anesthetic (Use 'Patient Medications'  Section for Anesthetic Order Entry): Lidocaine applied to wound bed Additional Orders / Instructions: Follow Nutritious Diet and Increase Protein Intake WOUND #1: - Lower Leg Wound Laterality: Right, Midline Cleanser: Vashe 5.8 (oz) 1 x Per Day/30 Days Discharge Instructions: Use vashe 5.8 (oz) as directed Prim Dressing: Endoform Natural, Non-fenestrated, 2x2 (in/in) 1 x Per Day/30 Days ary Secondary Dressing: (BORDER) Zetuvit Plus SILICONE BORDER Dressing 4x4 (in/in) 1 x Per Day/30 Days Discharge Instructions: Please do not put silicone bordered dressings under wraps. Use non-bordered dressing only. Secured With: Tubigrip Size C, 2.75x10 (in/yd) 1 x Per Day/30 Days Discharge Instructions: Apply 3 Tubigrip C 3-finger-widths below knee to base of toes to secure dressing and/or for swelling. 1. Continue endoform and border foam cover. She is changing this every second day 2. Wound is measuring smaller and appears to be making good progress towards healing. 3. She is wearing a size 3 Tubigrip as well Electronic Signature(s) Signed: 11/29/2022 2:26:28 PM By: Baltazar Najjar MD Entered By: Baltazar Najjar on 11/29/2022 11:04:59 -------------------------------------------------------------------------------- SuperBill Details Patient Name: Date of Service: [name], Meredith [name] 11/29/2022 Medical Record Number: [id number] Patient Account Number: [id number] Date of Birth/Sex: Treating RN: [birth year] (72 y.o. Skip Mayer Primary Care Provider: Daniel Nones Other Clinician: Referring Provider: Treating Provider/Extender: Chauncey Mann, MICHA EL Moshe [place]is[place] Weeks in Treatment: 6 Diagnosis Coding ICD-10 Codes Code Description [phone] Laceration without foreign body, right lower leg, initial encounter L97.812 Non-pressure chronic ulcer of other part of right lower leg with fat layer exposed R03.0 Elevated blood-pressure reading, without diagnosis of hypertension Facility Procedures : CPT4 Code:  [id number] Description: 99213 - WOUND CARE VISIT-LEV 3 EST PT Modifier: Quantity: 1 Physician Procedures : CPT4 Code Description Modifier [id number] 99213 - WC PHYS LEVEL 3 - EST PT ICD-10 Diagnosis Description S81.811A Laceration without foreign body, right lower leg, initial encounter L97.812 Non-pressure chronic ulcer of other part  of right lower leg with  fat layer exposed Quantity: 1 Electronic Signature(s) Signed: 11/29/2022 2:26:28 PM By: Baltazar Najjar MD Entered By: Baltazar Najjar on 11/29/2022 11:05:15

## 2022-12-05 ENCOUNTER — Ambulatory Visit: Payer: PRIVATE HEALTH INSURANCE | Admitting: Physician Assistant

## 2022-12-06 ENCOUNTER — Ambulatory Visit: Payer: PRIVATE HEALTH INSURANCE | Attending: Orthopedic Surgery | Admitting: Physical Therapy

## 2022-12-06 ENCOUNTER — Encounter: Payer: Self-pay | Admitting: Physical Therapy

## 2022-12-06 ENCOUNTER — Encounter: Payer: PRIVATE HEALTH INSURANCE | Attending: Physician Assistant | Admitting: Physician Assistant

## 2022-12-06 DIAGNOSIS — M5459 Other low back pain: Secondary | ICD-10-CM | POA: Insufficient documentation

## 2022-12-06 DIAGNOSIS — R262 Difficulty in walking, not elsewhere classified: Secondary | ICD-10-CM | POA: Insufficient documentation

## 2022-12-06 DIAGNOSIS — Y990 Civilian activity done for income or pay: Secondary | ICD-10-CM | POA: Diagnosis not present

## 2022-12-06 DIAGNOSIS — W19XXXA Unspecified fall, initial encounter: Secondary | ICD-10-CM | POA: Diagnosis not present

## 2022-12-06 DIAGNOSIS — L97812 Non-pressure chronic ulcer of other part of right lower leg with fat layer exposed: Secondary | ICD-10-CM | POA: Diagnosis not present

## 2022-12-06 DIAGNOSIS — M6281 Muscle weakness (generalized): Secondary | ICD-10-CM | POA: Diagnosis present

## 2022-12-06 DIAGNOSIS — R030 Elevated blood-pressure reading, without diagnosis of hypertension: Secondary | ICD-10-CM | POA: Diagnosis not present

## 2022-12-06 DIAGNOSIS — S81811A Laceration without foreign body, right lower leg, initial encounter: Secondary | ICD-10-CM | POA: Diagnosis present

## 2022-12-06 DIAGNOSIS — R2689 Other abnormalities of gait and mobility: Secondary | ICD-10-CM | POA: Insufficient documentation
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
## 2022-12-06 NOTE — Progress Notes (Signed)
LAKEVIA, PERRIS ([id number]) [id number] Page 1 of 6 Visit Report for 12/06/2022 Chief Complaint Document Details Patient Name: Date of Service: [name], [place] [name] 12/06/2022 9:30 A M Medical Record Number: [id number] Patient Account Number: [id number] Date of Birth/Sex: Treating RN: 1951/01/21 (72 y.o. [name] Primary Care Provider: Daniel Nones Other Clinician: Betha Loa Referring Provider: Treating Provider/Extender: Lois Huxley in Treatment: 7 Information Obtained from: Patient Chief Complaint Right LE ulcer Electronic Signature(s) Signed: 12/06/2022 9:49:02 AM By: Allen Derry PA-C Entered By: Allen Derry on 12/06/2022 09:49:02 -------------------------------------------------------------------------------- HPI Details Patient Name: Date of Service: [name], [name] 12/06/2022 9:30 A M Medical Record Number: [id number] Patient Account Number: [id number] Date of Birth/Sex: Treating RN: [birth year] (72 y.o. [name] Primary Care Provider: Daniel Nones Other Clinician: Betha Loa Referring Provider: Treating Provider/Extender: Lois Huxley in Treatment: 7 History of Present Illness HPI Description: 10-18-2022 patient presents today for initial inspection here in our clinic concerning issues that she has been having with a wound on her right anterior lower extremity. Unfortunately the patient had an issue where she was setting up a table and the table was leg had gotten stuck. When she was trying to dislodge it came loose too quickly and she ended up actually falling and this caused a significant issue with a skin tear on the right shin among other things including hitting her head and hurting her neck as well. Fortunately I do not see any signs overall of infection right now but there is definitely some need for sharp debridement in regard to the necrotic tissue at this point on the right anterior  shin. 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 claim. Patient has an elevated blood pressure reading today but no diagnosis of hypertension she has whitecoat syndrome otherwise she is a fairly healthy individual as far as wound healing is concerned. 6/19; patient was admitted to our clinic last week on 6/14 for traumatic leg wound. Upon having her dressing changed today it was noted she was having more serous drainage from the Opening in the center Of the wound and patient was concerned for infection and came in to be evaluated. Currently she denies fever/chills, increased warmth or erythema to the surrounding skin or Purulent drainage. She has been using collagen and Xeroform to the wound beds. 10-25-2022 upon evaluation today patient's wound actually appears to be doing much better at this time. Fortunately I do not see any signs of active infection locally or systemically which is good news that she actually seems to be better than apparently she was on Wednesday. Nonetheless she still has the center area that keeps collecting fluid and sealing up and then subsequently draining I think if we keep her swelling under control this will alleviate a lot of this issue in general. I discussed that with her today. TAKESHIA, WENK ([id number]) [id number] Page 2 of 6 11-01-2022 upon evaluation today patient appears to be doing well currently in regard to her wound. She has been tolerating the dressing changes without complication. Fortunately there does not appear to be any signs of active infection locally nor systemically which is great news. I am actually extremely pleased with where things stand at this point. 11-08-2022 upon evaluation today patient appears to be doing well currently in regard to her wound. She has been tolerating the dressing changes without complication. Fortunately there does not appear to be any signs of active infection locally  nor  systemically at this time which is great news. No fevers, chills, nausea, vomiting, or diarrhea. 11-15-2022 upon evaluation today patient's wound is actually showing signs of excellent improvement. There is just a very small area that is remaining open at this time and in general I think that we are making good headway towards closure. I do not see any signs of active infection locally or systemically which is great news. 7/26; traumatic wound on the right anterior mid tibia with. We changed to endoform last week patient is changing the dressing herself. This was initially traumatic with an accident at work 12-06-2022 upon evaluation today patient appears to be doing well currently in regard to her wound. She is actually showing signs of excellent improvement which is great news and in general I do believe that she is making great progress towards complete closure. Electronic Signature(s) Signed: 12/06/2022 10:09:57 AM By: Allen Derry PA-C Entered By: Allen Derry on 12/06/2022 10:09:57 -------------------------------------------------------------------------------- Physical Exam Details Patient Name: Date of Service: [name], [name] 12/06/2022 9:30 A M Medical Record Number: [id number] Patient Account Number: [id number] Date of Birth/Sex: Treating RN: 1950/05/12 (72 y.o. [name] Primary Care Provider: Daniel Nones Other Clinician: Betha Loa Referring Provider: Treating Provider/Extender: [name] in Treatment: 7 Constitutional Well-nourished and well-hydrated in no acute distress. Respiratory normal breathing without difficulty. Psychiatric this patient is able to make decisions and demonstrates good insight into disease process. Alert and Oriented x 3. pleasant and cooperative. Notes Upon inspection patient's wound bed actually showed signs of good granulation epithelization at this point. Fortunately I do not see any signs of worsening overall and I do  believe that the patient is making good headway towards closure. Will use endoform followed by the surgical wound which seems to be doing well. Electronic Signature(s) Signed: 12/06/2022 10:10:13 AM By: Allen Derry PA-C Entered By: Allen Derry on 12/06/2022 10:10:13 [place] ([id number]) [id number] Page 3 of 6 -------------------------------------------------------------------------------- Physician Orders Details Patient Name: Date of Service: [name], [place] [name] 12/06/2022 9:30 A M Medical Record Number: [id number] Patient Account Number: [id number] Date of Birth/Sex: Treating RN: [birth year] (72 y.o. [name] Primary Care Provider: Daniel Nones Other Clinician: Betha Loa Referring Provider: Treating Provider/Extender: Lois Huxley in Treatment: 7 Verbal / Phone Orders: Yes Clinician: Angelina Pih Read Back and Verified: Yes Diagnosis Coding ICD-10 Coding Code Description [phone] Laceration without foreign body, right lower leg, initial encounter L97.812 Non-pressure chronic ulcer of other part of right lower leg with fat layer exposed R03.0 Elevated blood-pressure reading, without diagnosis of hypertension Follow-up Appointments Return Appointment in 1 week. Nurse Visit as needed Bathing/ Shower/ Hygiene May shower; gently cleanse wound with antibacterial soap, rinse and pat dry prior to dressing wounds Anesthetic (Use 'Patient Medications' Section for Anesthetic Order Entry) Lidocaine applied to wound bed Additional Orders / Instructions Follow Nutritious Diet and Increase Protein Intake Wound Treatment Wound #1 - Lower Leg Wound Laterality: Right, Midline Cleanser: Vashe 5.8 (oz) 1 x Per Day/30 Days Discharge Instructions: Use vashe 5.8 (oz) as directed Prim Dressing: Endoform Natural, Non-fenestrated, 2x2 (in/in) ary 1 x Per Day/30 Days Secondary Dressing: (BORDER) Zetuvit Plus SILICONE BORDER Dressing 4x4  (in/in) 1 x Per Day/30 Days Discharge Instructions: Please do not put silicone bordered dressings under wraps. Use non-bordered dressing only. Secured With: Tubigrip Size C, 2.75x10 (in/yd) 1 x Per Day/30 Days Discharge Instructions: Apply 3 Tubigrip C 3-finger-widths below knee to base of toes  to secure dressing and/or for swelling. Electronic Signature(s) Signed: 12/09/2022 11:56:54 AM By: Betha Loa Signed: 12/09/2022 2:59:06 PM By: Allen Derry PA-C Entered By: Betha Loa on 12/06/2022 10:02:22 -------------------------------------------------------------------------------- Problem List Details Patient Name: Date of Service: [name], [name] 12/06/2022 9:30 A M Medical Record Number: [id number] Patient Account Number: [id number] Date of Birth/Sex: Treating RN: 1950-07-05 (72 y.o. [name] Primary Care Provider: Daniel Nones Other Clinician: Betha Loa Referring Provider: Treating Provider/Extender: Lois Huxley in Treatment: 7 Meredith, Prince ([id number]) [id number] Page 4 of 6 Active Problems ICD-10 Encounter Code Description Active Date MDM Diagnosis S81.811A Laceration without foreign body, right lower leg, initial encounter 10/18/2022 No Yes L97.812 Non-pressure chronic ulcer of other part of right lower leg with fat layer 10/18/2022 No Yes exposed R03.0 Elevated blood-pressure reading, without diagnosis of hypertension 10/18/2022 No Yes Inactive Problems Resolved Problems Electronic Signature(s) Signed: 12/06/2022 9:48:56 AM By: Allen Derry PA-C Entered By: Allen Derry on 12/06/2022 09:48:56 -------------------------------------------------------------------------------- Progress Note Details Patient Name: Date of Service: [name], [name] 12/06/2022 9:30 A M Medical Record Number: [id number] Patient Account Number: [id number] Date of Birth/Sex: Treating RN: 1950-11-04 (72 y.o. [name] Primary Care  Provider: Daniel Nones Other Clinician: Betha Loa Referring Provider: Treating Provider/Extender: Lois Huxley in Treatment: 7 Subjective Chief Complaint Information obtained from Patient Right LE ulcer History of Present Illness (HPI) 10-18-2022 patient presents today for initial inspection here in our clinic concerning issues that she has been having with a wound on her right anterior lower extremity. Unfortunately the patient had an issue where she was setting up a table and the table was leg had gotten stuck. When she was trying to dislodge it came loose too quickly and she ended up actually falling and this caused a significant issue with a skin tear on the right shin among other things including hitting her head and hurting her neck as well. Fortunately I do not see any signs overall of infection right now but there is definitely some need for sharp debridement in regard to the necrotic tissue at this point on the right anterior shin. 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 claim. Patient has an elevated blood pressure reading today but no diagnosis of hypertension she has whitecoat syndrome otherwise she is a fairly healthy individual as far as wound healing is concerned. 6/19; patient was admitted to our clinic last week on 6/14 for traumatic leg wound. Upon having her dressing changed today it was noted she was having more serous drainage from the Opening in the center Of the wound and patient was concerned for infection and came in to be evaluated. Currently she denies fever/chills, increased warmth or erythema to the surrounding skin or Purulent drainage. She has been using collagen and Xeroform to the wound beds. 10-25-2022 upon evaluation today patient's wound actually appears to be doing much better at this time. Fortunately I do not see any signs of active infection locally or systemically which is good news that she actually seems to be  better than apparently she was on Wednesday. Nonetheless she still has the center area that keeps collecting fluid and sealing up and then subsequently draining I think if we keep her swelling under control this will alleviate a lot of this issue in general. I discussed that with her today. 11-01-2022 upon evaluation today patient appears to be doing well currently in regard to her wound. She has been  tolerating the dressing changes without complication. Fortunately there does not appear to be any signs of active infection locally nor systemically which is great news. I am actually extremely pleased with where things stand at this point. [name], [name] ([id number]) [id number] Page 5 of 6 11-08-2022 upon evaluation today patient appears to be doing well currently in regard to her wound. She has been tolerating the dressing changes without complication. Fortunately there does not appear to be any signs of active infection locally nor systemically at this time which is great news. No fevers, chills, nausea, vomiting, or diarrhea. 11-15-2022 upon evaluation today patient's wound is actually showing signs of excellent improvement. There is just a very small area that is remaining open at this time and in general I think that we are making good headway towards closure. I do not see any signs of active infection locally or systemically which is great news. 7/26; traumatic wound on the right anterior mid tibia with. We changed to endoform last week patient is changing the dressing herself. This was initially traumatic with an accident at work 12-06-2022 upon evaluation today patient appears to be doing well currently in regard to her wound. She is actually showing signs of excellent improvement which is great news and in general I do believe that she is making great progress towards complete closure. Objective Constitutional Well-nourished and well-hydrated in no acute  distress. Vitals Time Taken: 9:44 AM, Height: 63 in, Weight: 128 lbs, BMI: 22.7, Temperature: 98.5 F, Pulse: 75 bpm, Respiratory Rate: 16 breaths/min, Blood Pressure: 164/105 mmHg. Respiratory normal breathing without difficulty. Psychiatric this patient is able to make decisions and demonstrates good insight into disease process. Alert and Oriented x 3. pleasant and cooperative. General Notes: Upon inspection patient's wound bed actually showed signs of good granulation epithelization at this point. Fortunately I do not see any signs of worsening overall and I do believe that the patient is making good headway towards closure. Will use endoform followed by the surgical wound which seems to be doing well. Integumentary (Hair, Skin) Wound #1 status is Open. Original cause of wound was Trauma. The date acquired was: 10/08/2022. The wound has been in treatment 7 weeks. The wound is located on the Right,Midline Lower Leg. The wound measures 0.3cm length x 0.1cm width x 0.2cm depth; 0.024cm^2 area and 0.005cm^3 volume. There is Fat Layer (Subcutaneous Tissue) exposed. There is a medium amount of serosanguineous drainage noted. There is medium (34-66%) red granulation within the wound bed. There is a medium (34-66%) amount of necrotic tissue within the wound bed including Adherent Slough. Assessment Active Problems ICD-10 Laceration without foreign body, right lower leg, initial encounter Non-pressure chronic ulcer of other part of right lower leg with fat layer exposed Elevated blood-pressure reading, without diagnosis of hypertension Plan Follow-up Appointments: Return Appointment in 1 week. Nurse Visit as needed Bathing/ Shower/ Hygiene: May shower; gently cleanse wound with antibacterial soap, rinse and pat dry prior to dressing wounds Anesthetic (Use 'Patient Medications' Section for Anesthetic Order Entry): Lidocaine applied to wound bed Additional Orders / Instructions: Follow Nutritious  Diet and Increase Protein Intake WOUND #1: - Lower Leg Wound Laterality: Right, Midline Cleanser: Vashe 5.8 (oz) 1 x Per Day/30 Days Discharge Instructions: Use vashe 5.8 (oz) as directed Prim Dressing: Endoform Natural, Non-fenestrated, 2x2 (in/in) 1 x Per Day/30 Days ary Secondary Dressing: (BORDER) Zetuvit Plus SILICONE BORDER Dressing 4x4 (in/in) 1 x Per Day/30 Days Discharge Instructions: Please do not put silicone bordered dressings under wraps. Use  non-bordered dressing only. Secured With: Tubigrip Size C, 2.75x10 (in/yd) 1 x Per Day/30 Days Discharge Instructions: Apply 3 Tubigrip C 3-finger-widths below knee to base of toes to secure dressing and/or for swelling. Meredith, Prince ([id number]) [id number] Page 6 of 6 1. I would recommend that we have the patient continue to monitor for any signs of infection or worsening. Based on what I am seeing I do believe that we are headed in the right direction. 2. I would recommend as well the patient should continue to utilize the bordered foam over top of the endoform and surgical wound. 3. We will continue with the Tubigrip size C. We will see patient back for reevaluation in 1 week here in the clinic. If anything worsens or changes patient will contact our office for additional recommendations. Electronic Signature(s) Signed: 12/06/2022 10:10:34 AM By: Allen Derry PA-C Entered By: Allen Derry on 12/06/2022 10:10:34 -------------------------------------------------------------------------------- SuperBill Details Patient Name: Date of Service: [name], [name] 12/06/2022 Medical Record Number: [id number] Patient Account Number: [id number] Date of Birth/Sex: Treating RN: Jul 21, 1950 (72 y.o. [name] Riser, Luther Parody Primary Care Provider: Daniel Nones Other Clinician: Betha Loa Referring Provider: Treating Provider/Extender: Lois Huxley in Treatment: 7 Diagnosis Coding ICD-10 Codes Code  Description [phone] Laceration without foreign body, right lower leg, initial encounter L97.812 Non-pressure chronic ulcer of other part of right lower leg with fat layer exposed R03.0 Elevated blood-pressure reading, without diagnosis of hypertension Facility Procedures : CPT4 Code: [id number] Description: 99213 - WOUND CARE VISIT-LEV 3 EST PT Modifier: Quantity: 1 Physician Procedures : CPT4 Code Description Modifier [id number] - WC PHYS LEVEL 3 - EST PT ICD-10 Diagnosis Description S81.811A Laceration without foreign body, right lower leg, initial encounter L97.812 Non-pressure chronic ulcer of other part of right lower leg with  fat layer exposed R03.0 Elevated blood-pressure reading, without diagnosis of hypertension Quantity: 1 Electronic Signature(s) Signed: 12/06/2022 10:10:46 AM By: Allen Derry PA-C Entered By: Allen Derry on 12/06/2022 10:10:46

## 2022-12-09 ENCOUNTER — Other Ambulatory Visit: Payer: Self-pay

## 2022-12-09 NOTE — Progress Notes (Signed)
JAKAYA, SCHEETZ ([id number]) [id number] Page 1 of 9 Visit Report for 12/06/2022 Arrival Information Details Patient Name: Date of Service: [name], [place] [name] 12/06/2022 9:30 A M Medical Record Number: [id number] Patient Account Number: [id number] Date of Birth/Sex: Treating RN: 11/18/1950 (72 y.o. [name] Primary Care Narissa Beaufort: Daniel Nones Other Clinician: Betha Loa Referring Doyce Stonehouse: Treating Victoriah Wilds/Extender: Lois Huxley in Treatment: 7 Visit Information History Since Last Visit All ordered tests and consults were completed: No Patient Arrived: Ambulatory Added or deleted any medications: No Arrival Time: 09:38 Any new allergies or adverse reactions: No Transfer Assistance: None Had a fall or experienced change in No Patient Identification Verified: Yes activities of daily living that may affect Secondary Verification Process Completed: Yes risk of falls: Patient Requires Transmission-Based Precautions: No Signs or symptoms of abuse/neglect since last visito No Patient Has Alerts: Yes Hospitalized since last visit: No Patient Alerts: ABI 11/01/22 AVVS Implantable device outside of the clinic excluding No R: 1.13 L:1.25 cellular tissue based products placed in the center TBI R 0.64 L 0.92 since last visit: Has Dressing in Place as Prescribed: Yes Pain Present Now: No Electronic Signature(s) Signed: 12/09/2022 11:56:54 AM By: Betha Loa Entered By: Betha Loa on 12/06/2022 09:44:32 -------------------------------------------------------------------------------- Clinic Level of Care Assessment Details Patient Name: Date of ServiceNancie Prince [place] J. 12/06/2022 9:30 A M Medical Record Number: [id number] Patient Account Number: [id number] Date of Birth/Sex: Treating RN: 1950/05/11 (72 y.o. [name] Primary Care Curtez Brallier: Daniel Nones Other Clinician: Betha Loa Referring Donna Snooks: Treating  Orest Dygert/Extender: Lois Huxley in Treatment: 7 Clinic Level of Care Assessment Items TOOL 4 Quantity Score []  - 0 Use when only an EandM is performed on FOLLOW-UP visit ASSESSMENTS - Nursing Assessment / Reassessment X- 1 10 Reassessment of Co-morbidities (includes updates in patient status) X- 1 5 Reassessment of Adherence to Treatment Plan [name], [name] ([id number]) [phone].pdf Page 2 of 9 ASSESSMENTS - Wound and Skin A ssessment / Reassessment X - Simple Wound Assessment / Reassessment - one wound 1 5 []  - 0 Complex Wound Assessment / Reassessment - multiple wounds []  - 0 Dermatologic / Skin Assessment (not related to wound area) ASSESSMENTS - Focused Assessment []  - 0 Circumferential Edema Measurements - multi extremities []  - 0 Nutritional Assessment / Counseling / Intervention []  - 0 Lower Extremity Assessment (monofilament, tuning fork, pulses) []  - 0 Peripheral Arterial Disease Assessment (using hand held doppler) ASSESSMENTS - Ostomy and/or Continence Assessment and Care []  - 0 Incontinence Assessment and Management []  - 0 Ostomy Care Assessment and Management (repouching, etc.) PROCESS - Coordination of Care X - Simple Patient / Family Education for ongoing care 1 15 []  - 0 Complex (extensive) Patient / Family Education for ongoing care []  - 0 Staff obtains Chiropractor, Records, T Results / Process Orders est []  - 0 Staff telephones HHA, Nursing Homes / Clarify orders / etc []  - 0 Routine Transfer to another Facility (non-emergent condition) []  - 0 Routine Hospital Admission (non-emergent condition) []  - 0 New Admissions / Manufacturing engineer / Ordering NPWT Apligraf, etc. , []  - 0 Emergency Hospital Admission (emergent condition) X- 1 10 Simple Discharge Coordination []  - 0 Complex (extensive) Discharge Coordination PROCESS - Special Needs []  - 0 Pediatric / Minor Patient Management []  - 0 Isolation  Patient Management []  - 0 Hearing / Language / Visual special needs []  - 0 Assessment of Community assistance (transportation, D/C planning, etc.) []  - 0 Additional assistance /  Altered mentation []  - 0 Support Surface(s) Assessment (bed, cushion, seat, etc.) INTERVENTIONS - Wound Cleansing / Measurement X - Simple Wound Cleansing - one wound 1 5 []  - 0 Complex Wound Cleansing - multiple wounds X- 1 5 Wound Imaging (photographs - any number of wounds) []  - 0 Wound Tracing (instead of photographs) X- 1 5 Simple Wound Measurement - one wound []  - 0 Complex Wound Measurement - multiple wounds INTERVENTIONS - Wound Dressings []  - 0 Small Wound Dressing one or multiple wounds X- 1 15 Medium Wound Dressing one or multiple wounds []  - 0 Large Wound Dressing one or multiple wounds []  - 0 Application of Medications - topical []  - 0 Application of Medications - injection INTERVENTIONS - Miscellaneous []  - 0 External ear exam [name], [name] ([id number]) [id number] Page 3 of 9 []  - 0 Specimen Collection (cultures, biopsies, blood, body fluids, etc.) []  - 0 Specimen(s) / Culture(s) sent or taken to Lab for analysis []  - 0 Patient Transfer (multiple staff / Michiel Sites Lift / Similar devices) []  - 0 Simple Staple / Suture removal (25 or less) []  - 0 Complex Staple / Suture removal (26 or more) []  - 0 Hypo / Hyperglycemic Management (close monitor of Blood Glucose) []  - 0 Ankle / Brachial Index (ABI) - do not check if billed separately X- 1 5 Vital Signs Has the patient been seen at the hospital within the last three years: Yes Total Score: 80 Level Of Care: New/Established - Level 3 Electronic Signature(s) Signed: 12/09/2022 11:56:54 AM By: Betha Loa Entered By: Betha Loa on 12/06/2022 10:02:57 -------------------------------------------------------------------------------- Encounter Discharge Information Details Patient Name: Date of  Service: [name], Meredith [name] 12/06/2022 9:30 A M Medical Record Number: [id number] Patient Account Number: [id number] Date of Birth/Sex: Treating RN: [birth year] (72 y.o. [name] Primary Care Jamyah Folk: Daniel Nones Other Clinician: Betha Loa Referring Harli Engelken: Treating Barlow Harrison/Extender: Lois Huxley in Treatment: 7 Encounter Discharge Information Items Discharge Condition: Stable Ambulatory Status: Ambulatory Discharge Destination: Home Transportation: Private Auto Accompanied By: self Schedule Follow-up Appointment: Yes Clinical Summary of Care: Electronic Signature(s) Signed: 12/09/2022 11:56:54 AM By: Betha Loa Entered By: Betha Loa on 12/06/2022 10:04:17 Lower Extremity Assessment Details -------------------------------------------------------------------------------- [facility] ([id number]) [id number] Page 4 of 9 Patient Name: Date of Service: [name], [place] [name] 12/06/2022 9:30 A M Medical Record Number: [id number] Patient Account Number: [id number] Date of Birth/Sex: Treating RN: 06-26-1950 (72 y.o. [name] Primary Care Anita Laguna: Daniel Nones Other Clinician: Betha Loa Referring Anvita Hirata: Treating Letishia Elliott/Extender: [name] in Treatment: 7 Electronic Signature(s) Signed: 12/06/2022 1:30:42 PM By: Angelina Pih Signed: 12/09/2022 11:56:54 AM By: Betha Loa Entered By: Betha Loa on 12/06/2022 09:55:01 -------------------------------------------------------------------------------- Multi Wound Chart Details Patient Name: Date of Service: [name], Meredith [name] 12/06/2022 9:30 A M Medical Record Number: [id number] Patient Account Number: [id number] Date of Birth/Sex: Treating RN: August 29, 1950 (72 y.o. [name] Primary Care Malaina Mortellaro: Daniel Nones Other Clinician: Betha Loa Referring Ivianna Notch: Treating Keny Donald/Extender: Lois Huxley in  Treatment: 7 Vital Signs Height(in): 63 Pulse(bpm): 75 Weight(lbs): 128 Blood Pressure(mmHg): 164/105 Body Mass Index(BMI): 22.7 Temperature(F): 98.5 Respiratory Rate(breaths/min): 16 [1:Photos:] [N/A:N/A] Right, Midline Lower Leg N/A N/A Wound Location: Trauma N/A N/A Wounding Event: Trauma, Other N/A N/A Primary Etiology: Chronic sinus problems/congestion N/A N/A Comorbid History: 10/08/2022 N/A N/A Date Acquired: 7 N/A N/A Weeks of Treatment: Open N/A N/A Wound Status: No N/A N/A Wound Recurrence: Yes N/A N/A Clustered  Wound: 0.3x0.1x0.2 N/A N/A Measurements L x W x D (cm) 0.024 N/A N/A A (cm) : rea 0.005 N/A N/A Volume (cm) : 99.90% N/A N/A % Reduction in A rea: 99.90% N/A N/A % Reduction in Volume: Full Thickness With Exposed Support N/A N/A Classification: Structures Medium N/A N/A Exudate Amount: Serosanguineous N/A N/A Exudate Type: red, brown N/A N/A Exudate Color: Medium (34-66%) N/A N/A Granulation Amount: Red N/A N/A Granulation Quality: Medium (34-66%) N/A N/A Necrotic Amount: Fat Layer (Subcutaneous Tissue): Yes N/A N/A Exposed Structures: Fascia: No [name], [name] ([id number]) [id number] Page 5 of 9 Tendon: No Muscle: No Joint: No Bone: No Treatment Notes Electronic Signature(s) Signed: 12/09/2022 11:56:54 AM By: Betha Loa Entered By: Betha Loa on 12/06/2022 09:55:11 -------------------------------------------------------------------------------- Multi-Disciplinary Care Plan Details Patient Name: Date of Service: [name], M[place] [name] 12/06/2022 9:30 A M Medical Record Number: [id number] Patient Account Number: [id number] Date of Birth/Sex: Treating RN: [birth year] (72 y.o. [name] Primary Care Jewel Venditto: Daniel Nones Other Clinician: Betha Loa Referring Genesis Paget: Treating Tayli Buch/Extender: Lois Huxley in Treatment: 7 Active Inactive Wound/Skin Impairment Nursing  Diagnoses: Impaired tissue integrity Goals: Patient/caregiver will verbalize understanding of skin care regimen Date Initiated: 10/18/2022 Date Inactivated: 11/01/2022 Target Resolution Date: 10/18/2022 Goal Status: Met Ulcer/skin breakdown will have a volume reduction of 30% by week 4 Date Initiated: 10/18/2022 Date Inactivated: 11/25/2022 Target Resolution Date: 11/08/2022 Goal Status: Met Ulcer/skin breakdown will have a volume reduction of 50% by week 8 Date Initiated: 10/18/2022 Date Inactivated: 11/29/2022 Target Resolution Date: 12/06/2022 Goal Status: Met Ulcer/skin breakdown will have a volume reduction of 80% by week 12 Date Initiated: 10/18/2022 Target Resolution Date: 01/03/2023 Goal Status: Active Ulcer/skin breakdown will heal within 14 weeks Date Initiated: 10/18/2022 Target Resolution Date: 01/16/2023 Goal Status: Active Interventions: Assess patient/caregiver ability to obtain necessary supplies Assess patient/caregiver ability to perform ulcer/skin care regimen upon admission and as needed Assess ulceration(s) every visit Provide education on ulcer and skin care Treatment Activities: Referred to DME Makela Niehoff for dressing supplies : 10/18/2022 Skin care regimen initiated : 10/18/2022 Notes: Electronic Signature(s) Signed: 12/06/2022 1:30:42 PM By: Angelina Pih Signed: 12/09/2022 11:56:54 AM By: Vangie Bicker ([id number]) AM By: Hoyt Koch.pdf Page 6 of 9 Signed: 12/09/2022 11:56:54 Entered By: Betha Loa on 12/06/2022 10:03:08 -------------------------------------------------------------------------------- Pain Assessment Details Patient Name: Date of Service: [name] [name] 12/06/2022 9:30 A M Medical Record Number: [id number] Patient Account Number: [id number] Date of Birth/Sex: Treating RN: 1951-04-17 (73 y.o. [name] Primary Care Blair Lundeen: Daniel Nones Other Clinician: Betha Loa Referring  Rosela Supak: Treating Mavin Dyke/Extender: [name] in Treatment: 7 Active Problems Location of Pain Severity and Description of Pain Patient Has Paino No Site Locations Pain Management and Medication Current Pain Management: Electronic Signature(s) Signed: 12/06/2022 1:30:42 PM By: Angelina Pih Signed: 12/09/2022 11:56:54 AM By: Betha Loa Entered By: Betha Loa on 12/06/2022 09:48:47 -------------------------------------------------------------------------------- Patient/Caregiver Education Details Patient Name: Date of Service: [name], Meredith NNA Shela Commons 8/2/2024andnbsp9:30 A M Medical Record Number: [id number] Patient Account Number: [id number] [name], [name] ([id number]) [id number] Page 7 of 9 Date of Birth/Gender: Treating RN: Nov 10, 1950 (72 y.o. [name] Primary Care Physician: Daniel Nones Other Clinician: Betha Loa Referring Physician: Treating Physician/Extender: Lois Huxley in Treatment: 7 Education Assessment Education Provided To: Patient Education Topics Provided Wound/Skin Impairment: Handouts: Other: continue wound care as directed Methods: Explain/Verbal Responses: State content correctly Electronic Signature(s) Signed: 12/09/2022 11:56:54 AM By: Betha Loa  Entered By: Betha Loa on 12/06/2022 10:03:25 -------------------------------------------------------------------------------- Wound Assessment Details Patient Name: Date of Service: [name] [name] 12/06/2022 9:30 A M Medical Record Number: [id number] Patient Account Number: [id number] Date of Birth/Sex: Treating RN: 28-Feb-1951 (72 y.o. [name] Primary Care Nayara Taplin: Daniel Nones Other Clinician: Betha Loa Referring Vallery Mcdade: Treating Romano Stigger/Extender: [name] in Treatment: 7 Wound Status Wound Number: 1 Primary Etiology: Trauma, Other Wound Location: Right, Midline Lower  Leg Wound Status: Open Wounding Event: Trauma Comorbid History: Chronic sinus problems/congestion Date Acquired: 10/08/2022 Weeks Of Treatment: 7 Clustered Wound: Yes Photos Wound Measurements Length: (cm) 0 Width: (cm) 0 Depth: (cm) 0 Area: (cm) Volume: (cm) [name], [name] ([id number]) Wound Description Classification: Full Thickness With Exposed Suppo Exudate Amount: Medium Exudate Type: Serosanguineous Exudate Color: red, brown rt Structures .3 % Reduction in Area: 99.9% .1 % Reduction in Volume: 99.9% .2 0.024 0.005 [id number] Page 8 of 9 Wound Bed Granulation Amount: Medium (34-66%) Exposed Structure Granulation Quality: Red Fascia Exposed: No Necrotic Amount: Medium (34-66%) Fat Layer (Subcutaneous Tissue) Exposed: Yes Necrotic Quality: Adherent Slough Tendon Exposed: No Muscle Exposed: No Joint Exposed: No Bone Exposed: No Treatment Notes Wound #1 (Lower Leg) Wound Laterality: Right, Midline Cleanser Vashe 5.8 (oz) Discharge Instruction: Use vashe 5.8 (oz) as directed Peri-Wound Care Topical Primary Dressing Endoform Natural, Non-fenestrated, 2x2 (in/in) Secondary Dressing (BORDER) Zetuvit Plus SILICONE BORDER Dressing 4x4 (in/in) Discharge Instruction: Please do not put silicone bordered dressings under wraps. Use non-bordered dressing only. Secured With Tubigrip Size C, 2.75x10 (in/yd) Discharge Instruction: Apply 3 Tubigrip C 3-finger-widths below knee to base of toes to secure dressing and/or for swelling. Compression Wrap Compression Stockings Add-Ons Electronic Signature(s) Signed: 12/06/2022 1:30:42 PM By: Angelina Pih Signed: 12/09/2022 11:56:54 AM By: Betha Loa Entered By: Betha Loa on 12/06/2022 09:54:48 -------------------------------------------------------------------------------- Vitals Details Patient Name: Date of Service: [name], Meredith [name] 12/06/2022 9:30 A M Medical Record Number:  [id number] Patient Account Number: [id number] Date of Birth/Sex: Treating RN: 1950-10-12 (72 y.o. [name] Primary Care Anupama Piehl: Daniel Nones Other Clinician: Betha Loa Referring Hoda Hon: Treating Tenesia Escudero/Extender: Lois Huxley in Treatment: 7 Vital Signs Time Taken: 09:44 Temperature (F): 98.5 Height (in): 63 Pulse (bpm): 75 [name], [name] ([id number]) [id number] Page 9 of 9 Weight (lbs): 128 Respiratory Rate (breaths/min): 16 Body Mass Index (BMI): 22.7 Blood Pressure (mmHg): 164/105 Reference Range: 80 - 120 mg / dl Electronic Signature(s) Signed: 12/09/2022 11:56:54 AM By: Betha Loa Entered By: Betha Loa on 12/06/2022 09:48:43

## 2022-12-11 ENCOUNTER — Ambulatory Visit: Payer: PRIVATE HEALTH INSURANCE

## 2022-12-11 DIAGNOSIS — M5459 Other low back pain: Secondary | ICD-10-CM | POA: Diagnosis not present

## 2022-12-11 DIAGNOSIS — M6281 Muscle weakness (generalized): Secondary | ICD-10-CM

## 2022-12-11 DIAGNOSIS — R262 Difficulty in walking, not elsewhere classified: Secondary | ICD-10-CM

## 2022-12-11 DIAGNOSIS — R2689 Other abnormalities of gait and mobility: Secondary | ICD-10-CM
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
## 2022-12-12 ENCOUNTER — Encounter: Payer: PRIVATE HEALTH INSURANCE | Admitting: Physical Therapy

## 2022-12-13 ENCOUNTER — Other Ambulatory Visit (HOSPITAL_BASED_OUTPATIENT_CLINIC_OR_DEPARTMENT_OTHER): Payer: Self-pay

## 2022-12-13 ENCOUNTER — Encounter: Payer: PRIVATE HEALTH INSURANCE | Admitting: Physical Therapy

## 2022-12-13 ENCOUNTER — Encounter: Payer: PRIVATE HEALTH INSURANCE | Admitting: Physician Assistant

## 2022-12-13 ENCOUNTER — Other Ambulatory Visit: Payer: Self-pay

## 2022-12-13 DIAGNOSIS — S81811A Laceration without foreign body, right lower leg, initial encounter: Secondary | ICD-10-CM | POA: Diagnosis not present

## 2022-12-13 MED ORDER — CYCLOBENZAPRINE HCL 5 MG PO TABS
ORAL_TABLET | ORAL | 1 refills | Status: DC
  Filled 2022-12-13: qty 60, 60d supply, fill #0

## 2022-12-13 MED ORDER — METHOCARBAMOL 500 MG PO TABS
500.0000 mg | ORAL_TABLET | ORAL | 1 refills | Status: DC
  Filled 2022-12-13: qty 60, 15d supply, fill #0
  Filled 2022-12-20: qty 60, 20d supply, fill #0

## 2022-12-13 MED ORDER — MELOXICAM 15 MG PO TABS
15.0000 mg | ORAL_TABLET | Freq: Every day | ORAL | 1 refills | Status: DC
  Filled 2022-12-13 – 2023-01-07 (×4): qty 30, 30d supply, fill #0

## 2022-12-13 NOTE — Progress Notes (Addendum)
BRIGET, RODRIGUE ([id number]) [id number] Page 1 of 7 Visit Report for 12/13/2022 Chief Complaint Document Details Patient Name: Date of Service: [name], [place] [name] 12/13/2022 11:15 A M Medical Record Number: [id number] Patient Account Number: [id number] Date of Birth/Sex: Treating RN: 12-20-1950 (72 y.o. Esmeralda Links Primary Care Provider: Daniel Nones Other Clinician: Betha Loa Referring Provider: Treating Provider/Extender: Lois Huxley in Treatment: 8 Information Obtained from: Patient Chief Complaint Right LE ulcer Electronic Signature(s) Signed: 12/13/2022 11:16:30 AM By: Allen Derry PA-C Entered By: Allen Derry on 12/13/2022 11:16:30 -------------------------------------------------------------------------------- Debridement Details Patient Name: Date of Service: [name], Meredith [name] 12/13/2022 11:15 A M Medical Record Number: [id number] Patient Account Number: [id number] Date of Birth/Sex: Treating RN: December 10, 1950 (72 y.o. Esmeralda Links Primary Care Provider: Daniel Nones Other Clinician: Referring Provider: Treating Provider/Extender: Lois Huxley in Treatment: 8 Debridement Performed for Assessment: Wound #1 Right,Midline Lower Leg Performed By: Physician Allen Derry, PA-C Debridement Type: Debridement Level of Consciousness (Pre-procedure): Awake and Alert Pre-procedure Verification/Time Out Yes - 11:28 Taken: Pain Control: Lidocaine 4% T opical Solution Percent of Wound Bed Debrided: 100% T Area Debrided (cm): otal 0.08 Tissue and other material debrided: Viable, Non-Viable, Skin: Epidermis Level: Skin/Epidermis Debridement Description: Selective/Open Wound Instrument: Curette Bleeding: Minimum Hemostasis Achieved: Pressure Response to Treatment: Procedure was tolerated well Level of Consciousness (Post- Awake and Alert procedure): [name], [name] ([id number])  [id number] Page 2 of 7 Post Debridement Measurements of Total Wound Length: (cm) 0.5 Width: (cm) 0.2 Depth: (cm) 0.1 Volume: (cm) 0.008 Character of Wound/Ulcer Post Debridement: Stable Post Procedure Diagnosis Same as Pre-procedure Electronic Signature(s) Signed: 12/13/2022 12:04:52 PM By: Allen Derry PA-C Signed: 12/13/2022 12:12:04 PM By: Angelina Pih Entered By: Angelina Pih on 12/13/2022 11:28:49 -------------------------------------------------------------------------------- HPI Details Patient Name: Date of Service: [name], Meredith [name] 12/13/2022 11:15 A M Medical Record Number: [id number] Patient Account Number: [id number] Date of Birth/Sex: Treating RN: October 16, 1950 (72 y.o. Esmeralda Links Primary Care Provider: Daniel Nones Other Clinician: Betha Loa Referring Provider: Treating Provider/Extender: Lois Huxley in Treatment: 8 History of Present Illness HPI Description: 10-18-2022 patient presents today for initial inspection here in our clinic concerning issues that she has been having with a wound on her right anterior lower extremity. Unfortunately the patient had an issue where she was setting up a table and the table was leg had gotten stuck. When she was trying to dislodge it came loose too quickly and she ended up actually falling and this caused a significant issue with a skin tear on the right shin among other things including hitting her head and hurting her neck as well. Fortunately I do not see any signs overall of infection right now but there is definitely some need for sharp debridement in regard to the necrotic tissue at this point on the right anterior shin. 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 claim. Patient has an elevated blood pressure reading today but no diagnosis of hypertension she has whitecoat syndrome otherwise she is a fairly healthy individual as far as wound healing is  concerned. 6/19; patient was admitted to our clinic last week on 6/14 for traumatic leg wound. Upon having her dressing changed today it was noted she was having more serous drainage from the Opening in the center Of the wound and patient was concerned for infection and came in to be evaluated. Currently she denies fever/chills, increased warmth or erythema to the surrounding  skin or Purulent drainage. She has been using collagen and Xeroform to the wound beds. 10-25-2022 upon evaluation today patient's wound actually appears to be doing much better at this time. Fortunately I do not see any signs of active infection locally or systemically which is good news that she actually seems to be better than apparently she was on Wednesday. Nonetheless she still has the center area that keeps collecting fluid and sealing up and then subsequently draining I think if we keep her swelling under control this will alleviate a lot of this issue in general. I discussed that with her today. 11-01-2022 upon evaluation today patient appears to be doing well currently in regard to her wound. She has been tolerating the dressing changes without complication. Fortunately there does not appear to be any signs of active infection locally nor systemically which is great news. I am actually extremely pleased with where things stand at this point. 11-08-2022 upon evaluation today patient appears to be doing well currently in regard to her wound. She has been tolerating the dressing changes without complication. Fortunately there does not appear to be any signs of active infection locally nor systemically at this time which is great news. No fevers, chills, nausea, vomiting, or diarrhea. 11-15-2022 upon evaluation today patient's wound is actually showing signs of excellent improvement. There is just a very small area that is remaining open at this time and in general I think that we are making good headway towards closure. I do not  see any signs of active infection locally or systemically which is great news. 7/26; traumatic wound on the right anterior mid tibia with. We changed to endoform last week patient is changing the dressing herself. This was initially traumatic with an accident at work 12-06-2022 upon evaluation today patient appears to be doing well currently in regard to her wound. She is actually showing signs of excellent improvement which is great news and in general I do believe that she is making great progress towards complete closure. 12-13-2022 upon evaluation today patient appears to be doing well currently in regard to her wounds this actually looks much better with the surgical wound compared to last week when it was so dry. This is healing quite nicely. [name], [name] ([id number]) [id number] Page 3 of 7 Electronic Signature(s) Signed: 12/13/2022 11:31:59 AM By: Allen Derry PA-C Entered By: Allen Derry on 12/13/2022 11:31:59 -------------------------------------------------------------------------------- Physical Exam Details Patient Name: Date of Service: [name], [place] [name] 12/13/2022 11:15 A M Medical Record Number: [id number] Patient Account Number: [id number] Date of Birth/Sex: Treating RN: [birth year] (72 y.o. Esmeralda Links Primary Care Provider: Daniel Nones Other Clinician: Referring Provider: Treating Provider/Extender: [name] in Treatment: 8 Constitutional Well-nourished and well-hydrated in no acute distress. Respiratory normal breathing without difficulty. Psychiatric this patient is able to make decisions and demonstrates good insight into disease process. Alert and Oriented x 3. pleasant and cooperative. Notes Upon evaluation today patient appears to be doing excellent in regard to her wound and I did I perform just a little light debridement just removing some of the epidermis that was kind of overlapping skin underneath and  postdebridement there was no bleeding she seemed to be doing extremely well. Electronic Signature(s) Signed: 12/13/2022 11:32:18 AM By: Allen Derry PA-C Entered By: Allen Derry on 12/13/2022 11:32:18 -------------------------------------------------------------------------------- Physician Orders Details Patient Name: Date of Service: [name], Meredith [name] 12/13/2022 11:15 A M Medical Record Number: [id number] Patient Account Number: [id number] Date of Birth/Sex:  Treating RN: 22-Jun-1950 (72 y.o. Esmeralda Links Primary Care Provider: Daniel Nones Other Clinician: Referring Provider: Treating Provider/Extender: Lois Huxley in Treatment: 8 Verbal / Phone Orders: No Diagnosis Coding ICD-10 Coding Code Description [phone] Laceration without foreign body, right lower leg, initial encounter L97.812 Non-pressure chronic ulcer of other part of right lower leg with fat layer exposed [name], [name] ([id number]) [id number] Page 4 of 7 R03.0 Elevated blood-pressure reading, without diagnosis of hypertension Follow-up Appointments Return Appointment in 1 week. Nurse Visit as needed Bathing/ Shower/ Hygiene May shower; gently cleanse wound with antibacterial soap, rinse and pat dry prior to dressing wounds Anesthetic (Use 'Patient Medications' Section for Anesthetic Order Entry) Lidocaine applied to wound bed Additional Orders / Instructions Follow Nutritious Diet and Increase Protein Intake Wound Treatment Wound #1 - Lower Leg Wound Laterality: Right, Midline Cleanser: Vashe 5.8 (oz) 1 x Per Day/30 Days Discharge Instructions: Use vashe 5.8 (oz) as directed Prim Dressing: Endoform Natural, Non-fenestrated, 2x2 (in/in) ary 1 x Per Day/30 Days Prim Dressing: surgi lube ary 1 x Per Day/30 Days Secondary Dressing: (BORDER) Zetuvit Plus SILICONE BORDER Dressing 4x4 (in/in) 1 x Per Day/30 Days Discharge Instructions: Please do not put silicone bordered  dressings under wraps. Use non-bordered dressing only. Electronic Signature(s) Signed: 12/13/2022 12:04:52 PM By: Allen Derry PA-C Signed: 12/13/2022 12:12:04 PM By: Angelina Pih Entered By: Angelina Pih on 12/13/2022 11:42:51 -------------------------------------------------------------------------------- Problem List Details Patient Name: Date of Service: [name], Meredith [name] 12/13/2022 11:15 A M Medical Record Number: [id number] Patient Account Number: [id number] Date of Birth/Sex: Treating RN: [birth year] (72 y.o. Esmeralda Links Primary Care Provider: Daniel Nones Other Clinician: Betha Loa Referring Provider: Treating Provider/Extender: Lois Huxley in Treatment: 8 Active Problems ICD-10 Encounter Code Description Active Date MDM Diagnosis S81.811A Laceration without foreign body, right lower leg, initial encounter 10/18/2022 No Yes L97.812 Non-pressure chronic ulcer of other part of right lower leg with fat layer 10/18/2022 No Yes exposed R03.0 Elevated blood-pressure reading, without diagnosis of hypertension 10/18/2022 No Yes [name], [name] ([id number]) [id number] Page 5 of 7 Inactive Problems Resolved Problems Electronic Signature(s) Signed: 12/13/2022 11:16:24 AM By: Allen Derry PA-C Entered By: Allen Derry on 12/13/2022 11:16:24 -------------------------------------------------------------------------------- Progress Note Details Patient Name: Date of Service: [name], Meredith [name] 12/13/2022 11:15 A M Medical Record Number: [id number] Patient Account Number: [id number] Date of Birth/Sex: Treating RN: [birth year] (72 y.o. Esmeralda Links Primary Care Provider: Daniel Nones Other Clinician: Referring Provider: Treating Provider/Extender: Lois Huxley in Treatment: 8 Subjective Chief Complaint Information obtained from Patient Right LE ulcer History of Present Illness (HPI) 10-18-2022 patient presents  today for initial inspection here in our clinic concerning issues that she has been having with a wound on her right anterior lower extremity. Unfortunately the patient had an issue where she was setting up a table and the table was leg had gotten stuck. When she was trying to dislodge it came loose too quickly and she ended up actually falling and this caused a significant issue with a skin tear on the right shin among other things including hitting her head and hurting her neck as well. Fortunately I do not see any signs overall of infection right now but there is definitely some need for sharp debridement in regard to the necrotic tissue at this point on the right anterior shin. 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 claim. Patient has an elevated blood pressure reading today  but no diagnosis of hypertension she has whitecoat syndrome otherwise she is a fairly healthy individual as far as wound healing is concerned. 6/19; patient was admitted to our clinic last week on 6/14 for traumatic leg wound. Upon having her dressing changed today it was noted she was having more serous drainage from the Opening in the center Of the wound and patient was concerned for infection and came in to be evaluated. Currently she denies fever/chills, increased warmth or erythema to the surrounding skin or Purulent drainage. She has been using collagen and Xeroform to the wound beds. 10-25-2022 upon evaluation today patient's wound actually appears to be doing much better at this time. Fortunately I do not see any signs of active infection locally or systemically which is good news that she actually seems to be better than apparently she was on Wednesday. Nonetheless she still has the center area that keeps collecting fluid and sealing up and then subsequently draining I think if we keep her swelling under control this will alleviate a lot of this issue in general. I discussed that with her  today. 11-01-2022 upon evaluation today patient appears to be doing well currently in regard to her wound. She has been tolerating the dressing changes without complication. Fortunately there does not appear to be any signs of active infection locally nor systemically which is great news. I am actually extremely pleased with where things stand at this point. 11-08-2022 upon evaluation today patient appears to be doing well currently in regard to her wound. She has been tolerating the dressing changes without complication. Fortunately there does not appear to be any signs of active infection locally nor systemically at this time which is great news. No fevers, chills, nausea, vomiting, or diarrhea. 11-15-2022 upon evaluation today patient's wound is actually showing signs of excellent improvement. There is just a very small area that is remaining open at this time and in general I think that we are making good headway towards closure. I do not see any signs of active infection locally or systemically which is great news. 7/26; traumatic wound on the right anterior mid tibia with. We changed to endoform last week patient is changing the dressing herself. This was initially traumatic with an accident at work 12-06-2022 upon evaluation today patient appears to be doing well currently in regard to her wound. She is actually showing signs of excellent improvement which is great news and in general I do believe that she is making great progress towards complete closure. 12-13-2022 upon evaluation today patient appears to be doing well currently in regard to her wounds this actually looks much better with the surgical wound compared to last week when it was so dry. This is healing quite nicely. [name], [name] ([id number]) [id number] Page 6 of 7 Objective Constitutional Well-nourished and well-hydrated in no acute distress. Vitals Time Taken: 11:17 AM, Height: 63 in, Weight: 128 lbs,  BMI: 22.7, Temperature: 97.7 F, Pulse: 71 bpm, Respiratory Rate: 18 breaths/min, Blood Pressure: 169/98 mmHg. Respiratory normal breathing without difficulty. Psychiatric this patient is able to make decisions and demonstrates good insight into disease process. Alert and Oriented x 3. pleasant and cooperative. General Notes: Upon evaluation today patient appears to be doing excellent in regard to her wound and I did I perform just a little light debridement just removing some of the epidermis that was kind of overlapping skin underneath and postdebridement there was no bleeding she seemed to be doing extremely well. Integumentary (Hair, Skin) Wound #1  status is Open. Original cause of wound was Trauma. The date acquired was: 10/08/2022. The wound has been in treatment 8 weeks. The wound is located on the Right,Midline Lower Leg. The wound measures 0.5cm length x 0.2cm width x 0.1cm depth; 0.079cm^2 area and 0.008cm^3 volume. There is Fat Layer (Subcutaneous Tissue) exposed. There is no tunneling or undermining noted. There is a medium amount of serosanguineous drainage noted. There is large (67-100%) pink, pale granulation within the wound bed. There is no necrotic tissue within the wound bed. Assessment Active Problems ICD-10 Laceration without foreign body, right lower leg, initial encounter Non-pressure chronic ulcer of other part of right lower leg with fat layer exposed Elevated blood-pressure reading, without diagnosis of hypertension Procedures Wound #1 Pre-procedure diagnosis of Wound #1 is a Trauma, Other located on the Right,Midline Lower Leg . There was a Selective/Open Wound Skin/Epidermis Debridement with a total area of 0.08 sq cm performed by Allen Derry, PA-C. With the following instrument(s): Curette to remove Viable and Non-Viable tissue/material. Material removed includes Skin: Epidermis after achieving pain control using Lidocaine 4% Topical Solution. No specimens were  taken. A time out was conducted at 11:28, prior to the start of the procedure. A Minimum amount of bleeding was controlled with Pressure. The procedure was tolerated well. Post Debridement Measurements: 0.5cm length x 0.2cm width x 0.1cm depth; 0.008cm^3 volume. Character of Wound/Ulcer Post Debridement is stable. Post procedure Diagnosis Wound #1: Same as Pre-Procedure Plan 1. I am going to recommend that we have the patient continue to monitor for any signs of infection or worsening were to continue with the same plan she is in agreement with plan and this is to have her utilize the endoform and then following this for using a surgical wound. 2. I am going to recommend the patient should continue to cover this with the bordered foam dressing. 3. I am also going to recommend that the patient should continue to monitor for any signs of infection or worsening if anything changes she should contact the office let me know. We will see patient back for reevaluation in 1 week here in the clinic. If anything worsens or changes patient will contact our office for additional recommendations. Electronic Signature(s) Signed: 12/13/2022 11:33:10 AM By: Allen Derry PA-C Entered By: Allen Derry on 12/13/2022 11:33:10  [place] ([id number]) [id number] Page 7 of 7 -------------------------------------------------------------------------------- SuperBill Details Patient Name: Date of Service: Luellen Pucker 12/13/2022 Medical Record Number: [id number] Patient Account Number: [id number] Date of Birth/Sex: Treating RN: 1951/01/13 (72 y.o. Esmeralda Links Primary Care Provider: Daniel Nones Other Clinician: Referring Provider: Treating Provider/Extender: Lois Huxley in Treatment: 8 Diagnosis Coding ICD-10 Codes Code Description [phone] Laceration without foreign body, right lower leg, initial encounter L97.812 Non-pressure chronic ulcer of other part  of right lower leg with fat layer exposed R03.0 Elevated blood-pressure reading, without diagnosis of hypertension Facility Procedures : CPT4 Code: [id number] Description: [phone] - DEBRIDE WOUND 1ST 20 SQ CM OR < ICD-10 Diagnosis Description L97.812 Non-pressure chronic ulcer of other part of right lower leg with fat layer expos Modifier: ed Quantity: 1 Physician Procedures : CPT4 Code Description Modifier [id number] - WC PHYS DEBR WO ANESTH 20 SQ CM ICD-10 Diagnosis Description L97.812 Non-pressure chronic ulcer of other part of right lower leg with fat layer exposed Quantity: 1 Electronic Signature(s) Signed: 12/13/2022 11:43:09 AM By: Angelina Pih Signed: 12/13/2022 12:04:52 PM By: Allen Derry PA-C Previous Signature: 12/13/2022 11:33:16 AM Version By: Allen Derry  PA-C Entered By: Angelina Pih on 12/13/2022 11:43:08

## 2022-12-13 NOTE — Progress Notes (Addendum)
Meredith, Prince ([id number]) [id number] Page 1 of 9 Visit Report for 12/13/2022 Arrival Information Details Patient Name: Date of Service: [name], [place] [name] 12/13/2022 11:15 A M Medical Record Number: [id number] Patient Account Number: [id number] Date of Birth/Sex: Treating RN: 20-Sep-1950 (72 y.o. [name] Primary Care Ezequias Lard: Daniel Nones Other Clinician: Referring Jania Steinke: Treating Skylynne Schlechter/Extender: Lois Huxley in Treatment: 8 Visit Information History Since Last Visit Added or deleted any medications: No Patient Arrived: Ambulatory Any new allergies or adverse reactions: No Arrival Time: 11:12 Had a fall or experienced change in No Accompanied By: self activities of daily living that may affect Transfer Assistance: None risk of falls: Patient Identification Verified: Yes Hospitalized since last visit: No Secondary Verification Process Completed: Yes Has Dressing in Place as Prescribed: Yes Patient Requires Transmission-Based Precautions: No Pain Present Now: No Patient Has Alerts: Yes Patient Alerts: ABI 11/01/22 AVVS R: 1.13 L:1.25 TBI R 0.64 L 0.92 Electronic Signature(s) Signed: 12/13/2022 11:42:03 AM By: Angelina Pih Entered By: Angelina Pih on 12/13/2022 11:42:03 -------------------------------------------------------------------------------- Clinic Level of Care Assessment Details Patient Name: Date of Service: [name] [place] J. 12/13/2022 11:15 A M Medical Record Number: [id number] Patient Account Number: [id number] Date of Birth/Sex: Treating RN: [birth year] (72 y.o. [name] Primary Care Keeghan Bialy: Daniel Nones Other Clinician: Referring Samarion Ehle: Treating Robey Massmann/Extender: Lois Huxley in Treatment: 8 Clinic Level of Care Assessment Items TOOL 1 Quantity Score []  - 0 Use when EandM and Procedure is performed on INITIAL visit ASSESSMENTS - Nursing Assessment / Reassessment []   - 0 General Physical Exam (combine w/ comprehensive assessment (listed just below) when performed on new pt. evals) []  - 0 Comprehensive Assessment (HX, ROS, Risk Assessments, Wounds Hx, etc.) ASSESSMENTS - Wound and Skin Assessment / Reassessment []  - 0 Dermatologic / Skin Assessment (not related to wound area) JOLEENA, PEZZA ([id number]) [id number] Page 2 of 9 ASSESSMENTS - Ostomy and/or Continence Assessment and Care []  - 0 Incontinence Assessment and Management []  - 0 Ostomy Care Assessment and Management (repouching, etc.) PROCESS - Coordination of Care []  - 0 Simple Patient / Family Education for ongoing care []  - 0 Complex (extensive) Patient / Family Education for ongoing care []  - 0 Staff obtains Chiropractor, Records, T Results / Process Orders est []  - 0 Staff telephones HHA, Nursing Homes / Clarify orders / etc []  - 0 Routine Transfer to another Facility (non-emergent condition) []  - 0 Routine Hospital Admission (non-emergent condition) []  - 0 New Admissions / Manufacturing engineer / Ordering NPWT Apligraf, etc. , []  - 0 Emergency Hospital Admission (emergent condition) PROCESS - Special Needs []  - 0 Pediatric / Minor Patient Management []  - 0 Isolation Patient Management []  - 0 Hearing / Language / Visual special needs []  - 0 Assessment of Community assistance (transportation, D/C planning, etc.) []  - 0 Additional assistance / Altered mentation []  - 0 Support Surface(s) Assessment (bed, cushion, seat, etc.) INTERVENTIONS - Miscellaneous []  - 0 External ear exam []  - 0 Patient Transfer (multiple staff / Nurse, adult / Similar devices) []  - 0 Simple Staple / Suture removal (25 or less) []  - 0 Complex Staple / Suture removal (26 or more) []  - 0 Hypo/Hyperglycemic Management (do not check if billed separately) []  - 0 Ankle / Brachial Index (ABI) - do not check if billed separately Has the patient been seen at the hospital  within the last three years: Yes Total Score: 0 Level Of Care: ____ Electronic Signature(s) Signed:  12/13/2022 12:12:04 PM By: Angelina Pih Entered By: Angelina Pih on 12/13/2022 11:43:00 -------------------------------------------------------------------------------- Encounter Discharge Information Details Patient Name: Date of Service: [name], [name] 12/13/2022 11:15 A M Medical Record Number: [id number] Patient Account Number: [id number] Date of Birth/Sex: Treating RN: 08/29/1950 (72 y.o. [name] Primary Care Marylynne Keelin: Daniel Nones Other Clinician: Referring Delonna Ney: Treating Darrel Gloss/Extender: Lois Huxley in Treatment: 8 Encounter Discharge Information Items Post Procedure Meredith, Prince ([id number]) [phone] Page 3 of 9 Discharge Condition: Stable Temperature (F): 97.7 Ambulatory Status: Ambulatory Pulse (bpm): 71 Discharge Destination: Home Respiratory Rate (breaths/min): 18 Transportation: Private Auto Blood Pressure (mmHg): 169/98 Accompanied By: self Schedule Follow-up Appointment: Yes Clinical Summary of Care: Electronic Signature(s) Signed: 12/13/2022 11:44:18 AM By: Angelina Pih Entered By: Angelina Pih on 12/13/2022 11:44:17 -------------------------------------------------------------------------------- Lower Extremity Assessment Details Patient Name: Date of Service: [name] [name] 12/13/2022 11:15 A M Medical Record Number: [id number] Patient Account Number: [id number] Date of Birth/Sex: Treating RN: August 06, 1950 (72 y.o. [name] Primary Care Kiyana Vazguez: Daniel Nones Other Clinician: Referring Silas Sedam: Treating Slayden Mennenga/Extender: [name] in Treatment: 8 Edema Assessment Assessed: [Left: No] [Right: No] Edema: [Left: N] [Right: o] Calf Left: Right: Point of Measurement: 34 cm From Medial Instep 33.6 cm Ankle Left: Right: Point of Measurement: 12 cm  From Medial Instep 19.5 cm Vascular Assessment Pulses: Dorsalis Pedis Palpable: [Right:Yes] Posterior Tibial Palpable: [Right:Yes] Extremity colors, hair growth, and conditions: Extremity Color: [Right:Normal] Hair Growth on Extremity: [Right:Yes] Temperature of Extremity: [Right:Warm < 3 seconds] Toe Nail Assessment Left: Right: Thick: No Discolored: No Deformed: No Improper Length and Hygiene: No Electronic Signature(s) Signed: 12/13/2022 11:42:27 AM By: Angelina Pih Entered By: Angelina Pih on 12/13/2022 11:42:27 [place] ([id number]) [id number] Page 4 of 9 -------------------------------------------------------------------------------- Multi Wound Chart Details Patient Name: Date of Service: [name], [place] [name] 12/13/2022 11:15 A M Medical Record Number: [id number] Patient Account Number: [id number] Date of Birth/Sex: Treating RN: March 11, 1951 (72 y.o. [name] Primary Care Tane Biegler: Daniel Nones Other Clinician: Referring Yussuf Sawyers: Treating Mariene Dickerman/Extender: Lois Huxley in Treatment: 8 Vital Signs Height(in): 63 Pulse(bpm): 71 Weight(lbs): 128 Blood Pressure(mmHg): 169/98 Body Mass Index(BMI): 22.7 Temperature(F): 97.7 Respiratory Rate(breaths/min): 18 [1:Photos:] [N/A:N/A] Right, Midline Lower Leg N/A N/A Wound Location: Trauma N/A N/A Wounding Event: Trauma, Other N/A N/A Primary Etiology: Chronic sinus problems/congestion N/A N/A Comorbid History: 10/08/2022 N/A N/A Date Acquired: 8 N/A N/A Weeks of Treatment: Open N/A N/A Wound Status: No N/A N/A Wound Recurrence: Yes N/A N/A Clustered Wound: 0.5x0.2x0.1 N/A N/A Measurements L x W x D (cm) 0.079 N/A N/A A (cm) : rea 0.008 N/A N/A Volume (cm) : 99.70% N/A N/A % Reduction in A rea: 99.90% N/A N/A % Reduction in Volume: Full Thickness With Exposed Support N/A N/A Classification: Structures Medium N/A N/A Exudate A  mount: Serosanguineous N/A N/A Exudate Type: red, brown N/A N/A Exudate Color: Large (67-100%) N/A N/A Granulation A mount: Pink, Pale N/A N/A Granulation Quality: None Present (0%) N/A N/A Necrotic A mount: Fat Layer (Subcutaneous Tissue): Yes N/A N/A Exposed Structures: Fascia: No Tendon: No Muscle: No Joint: No Bone: No None N/A N/A Epithelialization: Debridement - Selective/Open Wound N/A N/A Debridement: Pre-procedure Verification/Time Out 11:28 N/A N/A Taken: Lidocaine 4% Topical Solution N/A N/A Pain Control: Skin/Epidermis N/A N/A Level: 0.08 N/A N/A Debridement A (sq cm): rea Curette N/A N/A Instrument: Minimum N/A N/A Bleeding: Pressure N/A N/A Hemostasis A chieved: Procedure was tolerated well  N/A N/A Debridement Treatment ResponseLEONI, LEZ ([id number]) [id number] Page 5 of 9 Post Debridement Measurements L x 0.5x0.2x0.1 N/A N/A W x D (cm) 0.008 N/A N/A Post Debridement Volume: (cm) Debridement N/A N/A Procedures Performed: Treatment Notes Electronic Signature(s) Signed: 12/13/2022 11:42:35 AM By: Angelina Pih Entered By: Angelina Pih on 12/13/2022 11:42:35 -------------------------------------------------------------------------------- Multi-Disciplinary Care Plan Details Patient Name: Date of Service: [name], [name] 12/13/2022 11:15 A M Medical Record Number: [id number] Patient Account Number: [id number] Date of Birth/Sex: Treating RN: 06-23-1950 (72 y.o. [name] Primary Care Louay Myrie: Daniel Nones Other Clinician: Referring Marvine Encalade: Treating Justen Fonda/Extender: Lois Huxley in Treatment: 8 Active Inactive Wound/Skin Impairment Nursing Diagnoses: Impaired tissue integrity Goals: Patient/caregiver will verbalize understanding of skin care regimen Date Initiated: 10/18/2022 Date Inactivated: 11/01/2022 Target Resolution Date: 10/18/2022 Goal Status: Met Ulcer/skin breakdown  will have a volume reduction of 30% by week 4 Date Initiated: 10/18/2022 Date Inactivated: 11/25/2022 Target Resolution Date: 11/08/2022 Goal Status: Met Ulcer/skin breakdown will have a volume reduction of 50% by week 8 Date Initiated: 10/18/2022 Date Inactivated: 11/29/2022 Target Resolution Date: 12/06/2022 Goal Status: Met Ulcer/skin breakdown will have a volume reduction of 80% by week 12 Date Initiated: 10/18/2022 Target Resolution Date: 01/03/2023 Goal Status: Active Ulcer/skin breakdown will heal within 14 weeks Date Initiated: 10/18/2022 Target Resolution Date: 01/16/2023 Goal Status: Active Interventions: Assess patient/caregiver ability to obtain necessary supplies Assess patient/caregiver ability to perform ulcer/skin care regimen upon admission and as needed Assess ulceration(s) every visit Provide education on ulcer and skin care Treatment Activities: Referred to DME Claud Gowan for dressing supplies : 10/18/2022 Skin care regimen initiated : 10/18/2022 Notes: Electronic Signature(s) Signed: 12/13/2022 11:43:20 AM By: Megan Mans ([id number]) [id number] Page 6 of 9 Entered By: Angelina Pih on 12/13/2022 11:43:20 -------------------------------------------------------------------------------- Pain Assessment Details Patient Name: Date of Service: [name], [place] [name] 12/13/2022 11:15 A M Medical Record Number: [id number] Patient Account Number: [id number] Date of Birth/Sex: Treating RN: January 29, 1951 (72 y.o. [name] Primary Care Keishawn Rajewski: Daniel Nones Other Clinician: Referring Ashly Goethe: Treating Lash Matulich/Extender: Lois Huxley in Treatment: 8 Active Problems Location of Pain Severity and Description of Pain Patient Has Paino No Site Locations Rate the pain. Current Pain Level: 0 Pain Management and Medication Current Pain Management: Notes pt stating back pain Electronic Signature(s) Signed: 12/13/2022  11:42:14 AM By: Angelina Pih Entered By: Angelina Pih on 12/13/2022 11:42:14 -------------------------------------------------------------------------------- Patient/Caregiver Education Details Patient Name: Date of Service: [name], [name] 8/9/2024andnbsp11:15 A Keane Scrape ([id number]) [id number] Page 7 of 9 Medical Record Number: [id number] Patient Account Number: [id number] Date of Birth/Gender: Treating RN: 02/14/1951 (73 y.o. [name] Primary Care Physician: Daniel Nones Other Clinician: Referring Physician: Treating Physician/Extender: Lois Huxley in Treatment: 8 Education Assessment Education Provided To: Patient Education Topics Provided Wound Debridement: Handouts: Wound Debridement Methods: Explain/Verbal Responses: State content correctly Wound/Skin Impairment: Handouts: Caring for Your Ulcer Methods: Explain/Verbal Responses: State content correctly Electronic Signature(s) Signed: 12/13/2022 12:12:04 PM By: Angelina Pih Entered By: Angelina Pih on 12/13/2022 11:43:32 -------------------------------------------------------------------------------- Wound Assessment Details Patient Name: Date of Service: [name], [name] 12/13/2022 11:15 A M Medical Record Number: [id number] Patient Account Number: [id number] Date of Birth/Sex: Treating RN: 04-Jun-1950 (72 y.o. [name] Primary Care Babbette Dalesandro: Daniel Nones Other Clinician: Betha Loa Referring Remona Boom: Treating Chaselynn Kepple/Extender: [name] in Treatment: 8 Wound Status Wound Number: 1 Primary Etiology: Trauma, Other Wound Location: Right,  Midline Lower Leg Wound Status: Open Wounding Event: Trauma Comorbid History: Chronic sinus problems/congestion Date Acquired: 10/08/2022 Weeks Of Treatment: 8 Clustered Wound: Yes Photos Wound Measurements RENI, BICKEL ([id number]) Length: (cm) 0.5 Width: (cm)  0.2 Depth: (cm) 0.1 Area: (cm) 0.079 Volume: (cm) 0.008 [id number] Page 8 of 9 % Reduction in Area: 99.7% % Reduction in Volume: 99.9% Epithelialization: None Tunneling: No Undermining: No Wound Description Classification: Full Thickness With Exposed Suppo Exudate Amount: Medium Exudate Type: Serosanguineous Exudate Color: red, brown rt Structures Foul Odor After Cleansing: No Slough/Fibrino No Wound Bed Granulation Amount: Large (67-100%) Exposed Structure Granulation Quality: Pink, Pale Fascia Exposed: No Necrotic Amount: None Present (0%) Fat Layer (Subcutaneous Tissue) Exposed: Yes Tendon Exposed: No Muscle Exposed: No Joint Exposed: No Bone Exposed: No Treatment Notes Wound #1 (Lower Leg) Wound Laterality: Right, Midline Cleanser Vashe 5.8 (oz) Discharge Instruction: Use vashe 5.8 (oz) as directed Peri-Wound Care Topical Primary Dressing Endoform Natural, Non-fenestrated, 2x2 (in/in) surgi lube Secondary Dressing (BORDER) Zetuvit Plus SILICONE BORDER Dressing 4x4 (in/in) Discharge Instruction: Please do not put silicone bordered dressings under wraps. Use non-bordered dressing only. Secured With Compression Wrap Compression Stockings Add-Ons Electronic Signature(s) Signed: 12/13/2022 12:12:04 PM By: Angelina Pih Entered By: Angelina Pih on 12/13/2022 11:26:05 -------------------------------------------------------------------------------- Vitals Details Patient Name: Date of Service: [name], [name] 12/13/2022 11:15 A M Medical Record Number: [id number] Patient Account Number: [id number] Date of Birth/Sex: Treating RN: 21-Jan-1951 (72 y.o. [name] Primary Care Adolf Ormiston: Daniel Nones Other Clinician: Referring Octavian Godek: Treating Adyn Serna/Extender: Lois Huxley in Treatment: 8 Vital Signs ROMI, FULLENWIDER ([id number]) [id number] Page 9 of 9 Time Taken: 11:17 Temperature  (F): 97.7 Height (in): 63 Pulse (bpm): 71 Weight (lbs): 128 Respiratory Rate (breaths/min): 18 Body Mass Index (BMI): 22.7 Blood Pressure (mmHg): 169/98 Reference Range: 80 - 120 mg / dl Electronic Signature(s) Signed: 12/13/2022 11:42:08 AM By: Angelina Pih Entered By: Angelina Pih on 12/13/2022 11:42:08

## 2022-12-15 ENCOUNTER — Other Ambulatory Visit: Payer: Self-pay

## 2022-12-16 ENCOUNTER — Other Ambulatory Visit: Payer: Self-pay

## 2022-12-17 ENCOUNTER — Other Ambulatory Visit: Payer: Self-pay

## 2022-12-17 ENCOUNTER — Ambulatory Visit: Payer: PRIVATE HEALTH INSURANCE

## 2022-12-17 DIAGNOSIS — M5459 Other low back pain: Secondary | ICD-10-CM

## 2022-12-17 DIAGNOSIS — R2689 Other abnormalities of gait and mobility: Secondary | ICD-10-CM

## 2022-12-17 DIAGNOSIS — M6281 Muscle weakness (generalized): Secondary | ICD-10-CM

## 2022-12-17 DIAGNOSIS — R262 Difficulty in walking, not elsewhere classified: Secondary | ICD-10-CM
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
## 2022-12-19 ENCOUNTER — Ambulatory Visit: Payer: PRIVATE HEALTH INSURANCE | Admitting: Physical Therapy

## 2022-12-19 DIAGNOSIS — M5459 Other low back pain: Secondary | ICD-10-CM | POA: Diagnosis not present

## 2022-12-19 DIAGNOSIS — R2689 Other abnormalities of gait and mobility: Secondary | ICD-10-CM

## 2022-12-19 DIAGNOSIS — R262 Difficulty in walking, not elsewhere classified: Secondary | ICD-10-CM

## 2022-12-19 DIAGNOSIS — M6281 Muscle weakness (generalized): Secondary | ICD-10-CM
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
## 2022-12-20 ENCOUNTER — Other Ambulatory Visit: Payer: Self-pay

## 2022-12-20 ENCOUNTER — Encounter: Payer: PRIVATE HEALTH INSURANCE | Admitting: Physician Assistant

## 2022-12-20 DIAGNOSIS — S81811A Laceration without foreign body, right lower leg, initial encounter: Secondary | ICD-10-CM | POA: Diagnosis not present

## 2022-12-20 NOTE — Progress Notes (Signed)
SHELSY, WAKLEY ([id number]) [id number] Page 1 of 7 Visit Report for 12/20/2022 Chief Complaint Document Details Patient Name: Date of Service: [name], [place] Meredith J. 12/20/2022 8:30 A M Medical Record Number: [id number] Patient Account Number: [id number] Date of Birth/Sex: Treating RN: [birth year] (72 y.o. Skip Mayer Primary Care Provider: Daniel Nones Other Clinician: Betha Loa Referring Provider: Treating Provider/Extender: Lois Huxley in Treatment: 9 Information Obtained from: Patient Chief Complaint Right LE ulcer Electronic Signature(s) Signed: 12/20/2022 8:47:50 AM By: Allen Derry PA-C Entered By: Allen Derry on 12/20/2022 05:47:49 -------------------------------------------------------------------------------- HPI Details Patient Name: Date of Service: [name], DIA Meredith J. 12/20/2022 8:30 A M Medical Record Number: [id number] Patient Account Number: [id number] Date of Birth/Sex: Treating RN: 07/04/1950 (72 y.o. Skip Mayer Primary Care Provider: Daniel Nones Other Clinician: Betha Loa Referring Provider: Treating Provider/Extender: Lois Huxley in Treatment: 9 History of Present Illness HPI Description: 10-18-2022 patient presents today for initial inspection here in our clinic concerning issues that she has been having with a wound on her right anterior lower extremity. Unfortunately the patient had an issue where she was setting up a table and the table was leg had gotten stuck. When she was trying to dislodge it came loose too quickly and she ended up actually falling and this caused a significant issue with a skin tear on the right shin among other things including hitting her head and hurting her neck as well. Fortunately I do not see any signs overall of infection right now but there is definitely some need for sharp debridement in regard to the necrotic tissue at this point on the right anterior shin.  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 claim. Patient has an elevated blood pressure reading today but no diagnosis of hypertension she has whitecoat syndrome otherwise she is a fairly healthy individual as far as wound healing is concerned. 6/19; patient was admitted to our clinic last week on 6/14 for traumatic leg wound. Upon having her dressing changed today it was noted she was having more serous drainage from the Opening in the center Of the wound and patient was concerned for infection and came in to be evaluated. Currently she denies fever/chills, increased warmth or erythema to the surrounding skin or Purulent drainage. She has been using collagen and Xeroform to the wound beds. 10-25-2022 upon evaluation today patient's wound actually appears to be doing much better at this time. Fortunately I do not see any signs of active infection locally or systemically which is good news that she actually seems to be better than apparently she was on Wednesday. Nonetheless she still has the center area that keeps collecting fluid and sealing up and then subsequently draining I think if we keep her swelling under control this will alleviate a lot of this issue in general. I discussed that with her today. [name], [name] ([id number]) [id number] Page 2 of 7 11-01-2022 upon evaluation today patient appears to be doing well currently in regard to her wound. She has been tolerating the dressing changes without complication. Fortunately there does not appear to be any signs of active infection locally nor systemically which is great news. I am actually extremely pleased with where things stand at this point. 11-08-2022 upon evaluation today patient appears to be doing well currently in regard to her wound. She has been tolerating the dressing changes without complication. Fortunately there does not appear to be any signs of active infection locally  nor  systemically at this time which is great news. No fevers, chills, nausea, vomiting, or diarrhea. 11-15-2022 upon evaluation today patient's wound is actually showing signs of excellent improvement. There is just a very small area that is remaining open at this time and in general I think that we are making good headway towards closure. I do not see any signs of active infection locally or systemically which is great news. 7/26; traumatic wound on the right anterior mid tibia with. We changed to endoform last week patient is changing the dressing herself. This was initially traumatic with an accident at work 12-06-2022 upon evaluation today patient appears to be doing well currently in regard to her wound. She is actually showing signs of excellent improvement which is great news and in general I do believe that she is making great progress towards complete closure. 12-13-2022 upon evaluation today patient appears to be doing well currently in regard to her wounds this actually looks much better with the surgical wound compared to last week when it was so dry. This is healing quite nicely. 12-20-2022 upon evaluation today patient appears to be doing well currently in regard to her wound I think we can probably switch to the collagen at this point that is Prisma due to the fact that she is not having any real depth to the wound at this time. She is in agreement with plan. Electronic Signature(s) Signed: 12/20/2022 9:43:09 AM By: Allen Derry PA-C Entered By: Allen Derry on 12/20/2022 06:43:08 -------------------------------------------------------------------------------- Physical Exam Details Patient Name: Date of Service: [name], DIA Meredith J. 12/20/2022 8:30 A M Medical Record Number: [id number] Patient Account Number: [id number] Date of Birth/Sex: Treating RN: June 10, 1950 (72 y.o. Skip Mayer Primary Care Provider: Daniel Nones Other Clinician: Betha Loa Referring Provider: Treating  Provider/Extender: [name] in Treatment: 9 Constitutional Well-nourished and well-hydrated in no acute distress. Respiratory normal breathing without difficulty. Psychiatric this patient is able to make decisions and demonstrates good insight into disease process. Alert and Oriented x 3. pleasant and cooperative. Notes Upon inspection there was no sharp debridement today and overall the patient seems to be doing extremely well. I do not see any evidence of worsening overall I think the patient is making excellent headway towards complete closure. Electronic Signature(s) Signed: 12/20/2022 9:43:19 AM By: Allen Derry PA-C Entered By: Allen Derry on 12/20/2022 06:43:19 [place] ([id number]) [id number] Page 3 of 7 -------------------------------------------------------------------------------- Physician Orders Details Patient Name: Date of Service: [name], [place] Meredith J. 12/20/2022 8:30 A M Medical Record Number: [id number] Patient Account Number: [id number] Date of Birth/Sex: Treating RN: 05-25-1950 (72 y.o. Cathlean Cower, Kim Primary Care Provider: Daniel Nones Other Clinician: Betha Loa Referring Provider: Treating Provider/Extender: Lois Huxley in Treatment: 9 Verbal / Phone Orders: Yes Clinician: Huel Coventry Read Back and Verified: Yes Diagnosis Coding ICD-10 Coding Code Description [phone] Laceration without foreign body, right lower leg, initial encounter L97.812 Non-pressure chronic ulcer of other part of right lower leg with fat layer exposed R03.0 Elevated blood-pressure reading, without diagnosis of hypertension Follow-up Appointments Return Appointment in 1 week. Nurse Visit as needed Bathing/ Shower/ Hygiene May shower; gently cleanse wound with antibacterial soap, rinse and pat dry prior to dressing wounds Anesthetic (Use 'Patient Medications' Section for Anesthetic Order Entry) Lidocaine applied  to wound bed Additional Orders / Instructions Follow Nutritious Diet and Increase Protein Intake Wound Treatment Wound #1 - Lower Leg Wound Laterality: Right, Midline Cleanser: Vashe 5.8 (oz) 1  x Per Day/30 Days Discharge Instructions: Use vashe 5.8 (oz) as directed Prim Dressing: Prisma 4.34 (in) 1 x Per Day/30 Days ary Discharge Instructions: Moisten w/normal saline or sterile water; Cover wound as directed. Do not remove from wound bed. Secondary Dressing: (BORDER) Zetuvit Plus SILICONE BORDER Dressing 4x4 (in/in) 1 x Per Day/30 Days Discharge Instructions: Please do not put silicone bordered dressings under wraps. Use non-bordered dressing only. Secondary Dressing: Hydrogel 1 x Per Day/30 Days Electronic Signature(s) Signed: 12/20/2022 3:00:55 PM By: Allen Derry PA-C Signed: 12/23/2022 4:40:06 PM By: Betha Loa Entered By: Betha Loa on 12/20/2022 05:51:51 [place] ([id number]) [id number] Page 4 of 7 -------------------------------------------------------------------------------- Problem List Details Patient Name: Date of Service: [name], [place] Meredith J. 12/20/2022 8:30 A M Medical Record Number: [id number] Patient Account Number: [id number] Date of Birth/Sex: Treating RN: August 13, 1950 (72 y.o. Cathlean Cower, Kim Primary Care Provider: Daniel Nones Other Clinician: Betha Loa Referring Provider: Treating Provider/Extender: Lois Huxley in Treatment: 9 Active Problems ICD-10 Encounter Code Description Active Date MDM Diagnosis S81.811A Laceration without foreign body, right lower leg, initial encounter 10/18/2022 No Yes L97.812 Non-pressure chronic ulcer of other part of right lower leg with fat layer 10/18/2022 No Yes exposed R03.0 Elevated blood-pressure reading, without diagnosis of hypertension 10/18/2022 No Yes Inactive Problems Resolved Problems Electronic Signature(s) Signed: 12/20/2022 8:47:35 AM By: Allen Derry  PA-C Entered By: Allen Derry on 12/20/2022 05:47:35 -------------------------------------------------------------------------------- Progress Note Details Patient Name: Date of Service: [name], DIA Meredith J. 12/20/2022 8:30 A M Medical Record Number: [id number] Patient Account Number: [id number] Date of Birth/Sex: Treating RN: 1951/01/20 (71 y.o. Cathlean Cower, Kim Primary Care Provider: Daniel Nones Other Clinician: Betha Loa Referring Provider: Treating Provider/Extender: Lois Huxley in Treatment: [address] Subjective Chief Complaint ITHZEL, ENGLEDOW ([id number]) [id number] Page 5 of 7 Information obtained from Patient Right LE ulcer History of Present Illness (HPI) 10-18-2022 patient presents today for initial inspection here in our clinic concerning issues that she has been having with a wound on her right anterior lower extremity. Unfortunately the patient had an issue where she was setting up a table and the table was leg had gotten stuck. When she was trying to dislodge it came loose too quickly and she ended up actually falling and this caused a significant issue with a skin tear on the right shin among other things including hitting her head and hurting her neck as well. Fortunately I do not see any signs overall of infection right now but there is definitely some need for sharp debridement in regard to the necrotic tissue at this point on the right anterior shin. 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 claim. Patient has an elevated blood pressure reading today but no diagnosis of hypertension she has whitecoat syndrome otherwise she is a fairly healthy individual as far as wound healing is concerned. 6/19; patient was admitted to our clinic last week on 6/14 for traumatic leg wound. Upon having her dressing changed today it was noted she was having more serous drainage from the Opening in the center Of the wound and patient  was concerned for infection and came in to be evaluated. Currently she denies fever/chills, increased warmth or erythema to the surrounding skin or Purulent drainage. She has been using collagen and Xeroform to the wound beds. 10-25-2022 upon evaluation today patient's wound actually appears to be doing much better at this time. Fortunately I do not see any signs of active infection  locally or systemically which is good news that she actually seems to be better than apparently she was on Wednesday. Nonetheless she still has the center area that keeps collecting fluid and sealing up and then subsequently draining I think if we keep her swelling under control this will alleviate a lot of this issue in general. I discussed that with her today. 11-01-2022 upon evaluation today patient appears to be doing well currently in regard to her wound. She has been tolerating the dressing changes without complication. Fortunately there does not appear to be any signs of active infection locally nor systemically which is great news. I am actually extremely pleased with where things stand at this point. 11-08-2022 upon evaluation today patient appears to be doing well currently in regard to her wound. She has been tolerating the dressing changes without complication. Fortunately there does not appear to be any signs of active infection locally nor systemically at this time which is great news. No fevers, chills, nausea, vomiting, or diarrhea. 11-15-2022 upon evaluation today patient's wound is actually showing signs of excellent improvement. There is just a very small area that is remaining open at this time and in general I think that we are making good headway towards closure. I do not see any signs of active infection locally or systemically which is great news. 7/26; traumatic wound on the right anterior mid tibia with. We changed to endoform last week patient is changing the dressing herself. This was  initially traumatic with an accident at work 12-06-2022 upon evaluation today patient appears to be doing well currently in regard to her wound. She is actually showing signs of excellent improvement which is great news and in general I do believe that she is making great progress towards complete closure. 12-13-2022 upon evaluation today patient appears to be doing well currently in regard to her wounds this actually looks much better with the surgical wound compared to last week when it was so dry. This is healing quite nicely. 12-20-2022 upon evaluation today patient appears to be doing well currently in regard to her wound I think we can probably switch to the collagen at this point that is Prisma due to the fact that she is not having any real depth to the wound at this time. She is in agreement with plan. Objective Constitutional Well-nourished and well-hydrated in no acute distress. Vitals Time Taken: 8:36 AM, Weight: 128 lbs, Temperature: 98.0 F, Pulse: 72 bpm, Respiratory Rate: 16 breaths/min, Blood Pressure: 167/97 mmHg. Respiratory normal breathing without difficulty. Psychiatric this patient is able to make decisions and demonstrates good insight into disease process. Alert and Oriented x 3. pleasant and cooperative. General Notes: Upon inspection there was no sharp debridement today and overall the patient seems to be doing extremely well. I do not see any evidence of worsening overall I think the patient is making excellent headway towards complete closure. Integumentary (Hair, Skin) Wound #1 status is Open. Original cause of wound was Trauma. The date acquired was: 10/08/2022. The wound has been in treatment 9 weeks. The wound is located on the Right,Midline Lower Leg. The wound measures 0.5cm length x 0.1cm width x 0.1cm depth; 0.039cm^2 area and 0.004cm^3 volume. There is Fat Layer (Subcutaneous Tissue) exposed. There is a medium amount of serosanguineous drainage noted. There is large  (67-100%) pink, pale granulation within the wound bed. There is no necrotic tissue within the wound bed. Assessment Active Problems ICD-10 Laceration without foreign body, right lower leg, initial encounter Non-pressure  chronic ulcer of other part of right lower leg with fat layer exposed Elevated blood-pressure reading, without diagnosis of hypertension [name], [name] ([id number]) [id number] Page 6 of 7 Plan Follow-up Appointments: Return Appointment in 1 week. Nurse Visit as needed Bathing/ Shower/ Hygiene: May shower; gently cleanse wound with antibacterial soap, rinse and pat dry prior to dressing wounds Anesthetic (Use 'Patient Medications' Section for Anesthetic Order Entry): Lidocaine applied to wound bed Additional Orders / Instructions: Follow Nutritious Diet and Increase Protein Intake WOUND #1: - Lower Leg Wound Laterality: Right, Midline Cleanser: Vashe 5.8 (oz) 1 x Per Day/30 Days Discharge Instructions: Use vashe 5.8 (oz) as directed Prim Dressing: Prisma 4.34 (in) 1 x Per Day/30 Days ary Discharge Instructions: Moisten w/normal saline or sterile water; Cover wound as directed. Do not remove from wound bed. Secondary Dressing: (BORDER) Zetuvit Plus SILICONE BORDER Dressing 4x4 (in/in) 1 x Per Day/30 Days Discharge Instructions: Please do not put silicone bordered dressings under wraps. Use non-bordered dressing only. Secondary Dressing: Hydrogel 1 x Per Day/30 Days 1. I would recommend that we have the patient continue to monitor for any signs of infection or worsening. Based on what I see I think switching to Prisma would be the way to go. 2. I will recommend continue with Zetuvit. 3. I am also going to to suggest patient should continue to monitor for any signs of worsening overall if anything changes she will contact the office let me know. We will see patient back for reevaluation in 1 week here in the clinic. If anything worsens or  changes patient will contact our office for additional recommendations. Electronic Signature(s) Signed: 12/20/2022 9:43:50 AM By: Allen Derry PA-C Entered By: Allen Derry on 12/20/2022 06:43:50 -------------------------------------------------------------------------------- SuperBill Details Patient Name: Date of Service: [name], DIA Meredith J. 12/20/2022 Medical Record Number: [id number] Patient Account Number: [id number] Date of Birth/Sex: Treating RN: [birth year] (72 y.o. Cathlean Cower, Kim Primary Care Provider: Daniel Nones Other Clinician: Betha Loa Referring Provider: Treating Provider/Extender: Lois Huxley in Treatment: 9 Diagnosis Coding ICD-10 Codes Code Description [phone] Laceration without foreign body, right lower leg, initial encounter L97.812 Non-pressure chronic ulcer of other part of right lower leg with fat layer exposed R03.0 Elevated blood-pressure reading, without diagnosis of hypertension Facility Procedures Physician Procedures : CPT4 Code Description Modifier [phone] [id number] - WC PHYS LEVEL 3 - EST PT ICD-10 Diagnosis Description S81.811A Laceration without foreign body, right lower leg, initial encounter L97.812 Non-pressure chronic ulcer of other part of right lower leg with  fat layer exposed R03.0 Elevated blood-pressure reading, without diagnosis of hypertension Quantity: 1 Electronic Signature(s) Signed: 12/20/2022 9:44:02 AM By: Allen Derry PA-C Entered By: Allen Derry on 12/20/2022 06:44:02

## 2022-12-23 ENCOUNTER — Ambulatory Visit: Payer: PRIVATE HEALTH INSURANCE
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
## 2022-12-24 ENCOUNTER — Ambulatory Visit: Payer: PRIVATE HEALTH INSURANCE

## 2022-12-24 DIAGNOSIS — M5459 Other low back pain: Secondary | ICD-10-CM | POA: Diagnosis not present

## 2022-12-24 DIAGNOSIS — M6281 Muscle weakness (generalized): Secondary | ICD-10-CM

## 2022-12-24 DIAGNOSIS — R262 Difficulty in walking, not elsewhere classified: Secondary | ICD-10-CM

## 2022-12-25 ENCOUNTER — Ambulatory Visit: Payer: PRIVATE HEALTH INSURANCE
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
## 2022-12-26 ENCOUNTER — Ambulatory Visit: Payer: PRIVATE HEALTH INSURANCE

## 2022-12-26 DIAGNOSIS — M6281 Muscle weakness (generalized): Secondary | ICD-10-CM

## 2022-12-26 DIAGNOSIS — R2689 Other abnormalities of gait and mobility: Secondary | ICD-10-CM

## 2022-12-26 DIAGNOSIS — M5459 Other low back pain: Secondary | ICD-10-CM

## 2022-12-26 DIAGNOSIS — R262 Difficulty in walking, not elsewhere classified: Secondary | ICD-10-CM

## 2022-12-27 ENCOUNTER — Encounter: Payer: PRIVATE HEALTH INSURANCE | Attending: Physician Assistant | Admitting: Physician Assistant

## 2022-12-27 DIAGNOSIS — S81811A Laceration without foreign body, right lower leg, initial encounter: Secondary | ICD-10-CM | POA: Insufficient documentation

## 2022-12-27 DIAGNOSIS — L97812 Non-pressure chronic ulcer of other part of right lower leg with fat layer exposed: Secondary | ICD-10-CM | POA: Diagnosis not present

## 2022-12-27 DIAGNOSIS — R030 Elevated blood-pressure reading, without diagnosis of hypertension: Secondary | ICD-10-CM | POA: Diagnosis not present

## 2022-12-27 NOTE — Progress Notes (Addendum)
MADOLIN, STALLARD ([id number]) [id number] Page 1 of 7 Visit Report for 12/27/2022 Chief Complaint Document Details Patient Name: Date of Service: [name], [place] [name] 12/27/2022 8:30 A M Medical Record Number: [id number] Patient Account Number: [id number] Date of Birth/Sex: Treating RN: 1950/12/25 (72 y.o. [name] Primary Care Provider: Daniel Nones Other Clinician: Betha Loa Referring Provider: Treating Provider/Extender: Lois Huxley in Treatment: 10 Information Obtained from: Patient Chief Complaint Right LE ulcer Electronic Signature(s) Signed: 12/27/2022 9:12:07 AM By: Allen Derry PA-C Entered By: Allen Derry on 12/27/2022 06:12:06 -------------------------------------------------------------------------------- HPI Details Patient Name: Date of Service: [name], Meredith [name] 12/27/2022 8:30 A M Medical Record Number: [id number] Patient Account Number: [id number] Date of Birth/Sex: Treating RN: 02-25-1951 (72 y.o. [name] Primary Care Provider: Daniel Nones Other Clinician: Betha Loa Referring Provider: Treating Provider/Extender: Lois Huxley in Treatment: 10 History of Present Illness HPI Description: 10-18-2022 patient presents today for initial inspection here in our clinic concerning issues that she has been having with a wound on her right anterior lower extremity. Unfortunately the patient had an issue where she was setting up a table and the table was leg had gotten stuck. When she was trying to dislodge it came loose too quickly and she ended up actually falling and this caused a significant issue with a skin tear on the right shin among other things including hitting her head and hurting her neck as well. Fortunately I do not see any signs overall of infection right now but there is definitely some need for sharp debridement in regard to the necrotic tissue at this point on the right  anterior shin. 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 claim. Patient has an elevated blood pressure reading today but no diagnosis of hypertension she has whitecoat syndrome otherwise she is a fairly healthy individual as far as wound healing is concerned. 6/19; patient was admitted to our clinic last week on 6/14 for traumatic leg wound. Upon having her dressing changed today it was noted she was having more serous drainage from the Opening in the center Of the wound and patient was concerned for infection and came in to be evaluated. Currently she denies fever/chills, increased warmth or erythema to the surrounding skin or Purulent drainage. She has been using collagen and Xeroform to the wound beds. 10-25-2022 upon evaluation today patient's wound actually appears to be doing much better at this time. Fortunately I do not see any signs of active infection locally or systemically which is good news that she actually seems to be better than apparently she was on Wednesday. Nonetheless she still has the center area that keeps collecting fluid and sealing up and then subsequently draining I think if we keep her swelling under control this will alleviate a lot of this issue in general. I discussed that with her today. [name], [name] ([id number]) [id number] Page 2 of 7 11-01-2022 upon evaluation today patient appears to be doing well currently in regard to her wound. She has been tolerating the dressing changes without complication. Fortunately there does not appear to be any signs of active infection locally nor systemically which is great news. I am actually extremely pleased with where things stand at this point. 11-08-2022 upon evaluation today patient appears to be doing well currently in regard to her wound. She has been tolerating the dressing changes without complication. Fortunately there does not appear to be any signs of active infection locally  nor systemically at this time which is great news. No fevers, chills, nausea, vomiting, or diarrhea. 11-15-2022 upon evaluation today patient's wound is actually showing signs of excellent improvement. There is just a very small area that is remaining open at this time and in general I think that we are making good headway towards closure. I do not see any signs of active infection locally or systemically which is great news. 7/26; traumatic wound on the right anterior mid tibia with. We changed to endoform last week patient is changing the dressing herself. This was initially traumatic with an accident at work 12-06-2022 upon evaluation today patient appears to be doing well currently in regard to her wound. She is actually showing signs of excellent improvement which is great news and in general I do believe that she is making great progress towards complete closure. 12-13-2022 upon evaluation today patient appears to be doing well currently in regard to her wounds this actually looks much better with the surgical wound compared to last week when it was so dry. This is healing quite nicely. 12-20-2022 upon evaluation today patient appears to be doing well currently in regard to her wound I think we can probably switch to the collagen at this point that is Prisma due to the fact that she is not having any real depth to the wound at this time. She is in agreement with plan. 12-27-2022 upon evaluation today patient appears to be doing excellent in regard to her wound. She is tolerating the dressing changes without complication. Fortunately I do not see any signs of active infection at this time which is great news she is very close to complete resolution. Electronic Signature(s) Signed: 12/27/2022 9:25:23 AM By: Allen Derry PA-C Entered By: Allen Derry on 12/27/2022 06:25:23 -------------------------------------------------------------------------------- Physical Exam Details Patient Name: Date of  Service: [name], Meredith [name] 12/27/2022 8:30 A M Medical Record Number: [id number] Patient Account Number: [id number] Date of Birth/Sex: Treating RN: Dec 10, 1950 (72 y.o. [name] Primary Care Provider: Daniel Nones Other Clinician: Betha Loa Referring Provider: Treating Provider/Extender: [name] in Treatment: 10 Constitutional Well-nourished and well-hydrated in no acute distress. Respiratory normal breathing without difficulty. Psychiatric this patient is able to make decisions and demonstrates good insight into disease process. Alert and Oriented x 3. pleasant and cooperative. Notes Upon inspection patient's wound bed actually showed signs of good granulation epithelization at this point. Fortunately I do not see any signs of worsening at this time which is good news. I do not think that she is doing poorly at all at this point. Electronic Signature(s) Signed: 12/27/2022 9:25:37 AM By: Allen Derry PA-C Entered By: Allen Derry on 12/27/2022 06:25:37 [name] ([id number]) [id number] Page 3 of 7 -------------------------------------------------------------------------------- Physician Orders Details Patient Name: Date of Service: [name], [place] [name] 12/27/2022 8:30 A M Medical Record Number: [id number] Patient Account Number: [id number] Date of Birth/Sex: Treating RN: 1950/07/08 (73 y.o. [name] Primary Care Provider: Daniel Nones Other Clinician: Betha Loa Referring Provider: Treating Provider/Extender: Lois Huxley in Treatment: 10 Verbal / Phone Orders: Yes Clinician: Angelina Pih Read Back and Verified: Yes Diagnosis Coding ICD-10 Coding Code Description [phone] Laceration without foreign body, right lower leg, initial encounter L97.812 Non-pressure chronic ulcer of other part of right lower leg with fat layer exposed R03.0 Elevated blood-pressure reading, without  diagnosis of hypertension Follow-up Appointments Return Appointment in 1 week. Nurse Visit as needed Bathing/ Shower/ Hygiene May shower; gently cleanse  wound with antibacterial soap, rinse and pat dry prior to dressing wounds Anesthetic (Use 'Patient Medications' Section for Anesthetic Order Entry) Lidocaine applied to wound bed Additional Orders / Instructions Follow Nutritious Diet and Increase Protein Intake Wound Treatment Wound #1 - Lower Leg Wound Laterality: Right, Midline Cleanser: Vashe 5.8 (oz) 1 x Per Day/30 Days Discharge Instructions: Use vashe 5.8 (oz) as directed Peri-Wound Care: AandD Ointment 1 x Per Day/30 Days Discharge Instructions: apply tiny dot over prisma Prim Dressing: Prisma 4.34 (in) 1 x Per Day/30 Days ary Discharge Instructions: Moisten w/normal saline or sterile water; Cover wound as directed. Do not remove from wound bed. Secondary Dressing: (BORDER) Zetuvit Plus SILICONE BORDER Dressing 4x4 (in/in) 1 x Per Day/30 Days Discharge Instructions: Please do not put silicone bordered dressings under wraps. Use non-bordered dressing only. Electronic Signature(s) Signed: 12/27/2022 1:51:46 PM By: Betha Loa Signed: 12/27/2022 1:57:59 PM By: Allen Derry PA-C Entered By: Betha Loa on 12/27/2022 06:28:24 [place] Prince ([id number]) [id number] Page 4 of 7 -------------------------------------------------------------------------------- Problem List Details Patient Name: Date of Service: [name], [place] [name] 12/27/2022 8:30 A M Medical Record Number: [id number] Patient Account Number: [id number] Date of Birth/Sex: Treating RN: 16-Aug-1950 (72 y.o. [name] Primary Care Provider: Daniel Nones Other Clinician: Betha Loa Referring Provider: Treating Provider/Extender: Lois Huxley in Treatment: 10 Active Problems ICD-10 Encounter Code Description Active Date MDM Diagnosis S81.811A Laceration  without foreign body, right lower leg, initial encounter 10/18/2022 No Yes L97.812 Non-pressure chronic ulcer of other part of right lower leg with fat layer 10/18/2022 No Yes exposed R03.0 Elevated blood-pressure reading, without diagnosis of hypertension 10/18/2022 No Yes Inactive Problems Resolved Problems Electronic Signature(s) Signed: 12/27/2022 9:11:54 AM By: Allen Derry PA-C Entered By: Allen Derry on 12/27/2022 06:11:54 -------------------------------------------------------------------------------- Progress Note Details Patient Name: Date of Service: [name], Meredith [name] 12/27/2022 8:30 A M Medical Record Number: [id number] Patient Account Number: [id number] Date of Birth/Sex: Treating RN: [birth year] (72 y.o. [name] Primary Care Provider: Daniel Nones Other Clinician: Betha Loa Referring Provider: Treating Provider/Extender: Lois Huxley in Treatment: [address] Subjective Chief Complaint [name], [name] ([id number]) [id number] Page 5 of 7 Information obtained from Patient Right LE ulcer History of Present Illness (HPI) 10-18-2022 patient presents today for initial inspection here in our clinic concerning issues that she has been having with a wound on her right anterior lower extremity. Unfortunately the patient had an issue where she was setting up a table and the table was leg had gotten stuck. When she was trying to dislodge it came loose too quickly and she ended up actually falling and this caused a significant issue with a skin tear on the right shin among other things including hitting her head and hurting her neck as well. Fortunately I do not see any signs overall of infection right now but there is definitely some need for sharp debridement in regard to the necrotic tissue at this point on the right anterior shin. 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 claim. Patient has an elevated blood pressure  reading today but no diagnosis of hypertension she has whitecoat syndrome otherwise she is a fairly healthy individual as far as wound healing is concerned. 6/19; patient was admitted to our clinic last week on 6/14 for traumatic leg wound. Upon having her dressing changed today it was noted she was having more serous drainage from the Opening in the center Of the wound and patient was  concerned for infection and came in to be evaluated. Currently she denies fever/chills, increased warmth or erythema to the surrounding skin or Purulent drainage. She has been using collagen and Xeroform to the wound beds. 10-25-2022 upon evaluation today patient's wound actually appears to be doing much better at this time. Fortunately I do not see any signs of active infection locally or systemically which is good news that she actually seems to be better than apparently she was on Wednesday. Nonetheless she still has the center area that keeps collecting fluid and sealing up and then subsequently draining I think if we keep her swelling under control this will alleviate a lot of this issue in general. I discussed that with her today. 11-01-2022 upon evaluation today patient appears to be doing well currently in regard to her wound. She has been tolerating the dressing changes without complication. Fortunately there does not appear to be any signs of active infection locally nor systemically which is great news. I am actually extremely pleased with where things stand at this point. 11-08-2022 upon evaluation today patient appears to be doing well currently in regard to her wound. She has been tolerating the dressing changes without complication. Fortunately there does not appear to be any signs of active infection locally nor systemically at this time which is great news. No fevers, chills, nausea, vomiting, or diarrhea. 11-15-2022 upon evaluation today patient's wound is actually showing signs of excellent improvement.  There is just a very small area that is remaining open at this time and in general I think that we are making good headway towards closure. I do not see any signs of active infection locally or systemically which is great news. 7/26; traumatic wound on the right anterior mid tibia with. We changed to endoform last week patient is changing the dressing herself. This was initially traumatic with an accident at work 12-06-2022 upon evaluation today patient appears to be doing well currently in regard to her wound. She is actually showing signs of excellent improvement which is great news and in general I do believe that she is making great progress towards complete closure. 12-13-2022 upon evaluation today patient appears to be doing well currently in regard to her wounds this actually looks much better with the surgical wound compared to last week when it was so dry. This is healing quite nicely. 12-20-2022 upon evaluation today patient appears to be doing well currently in regard to her wound I think we can probably switch to the collagen at this point that is Prisma due to the fact that she is not having any real depth to the wound at this time. She is in agreement with plan. 12-27-2022 upon evaluation today patient appears to be doing excellent in regard to her wound. She is tolerating the dressing changes without complication. Fortunately I do not see any signs of active infection at this time which is great news she is very close to complete resolution. Objective Constitutional Well-nourished and well-hydrated in no acute distress. Vitals Time Taken: 8:38 AM, Weight: 128 lbs, Temperature: 97.7 F, Pulse: 73 bpm, Respiratory Rate: 18 breaths/min, Blood Pressure: 164/91 mmHg. Respiratory normal breathing without difficulty. Psychiatric this patient is able to make decisions and demonstrates good insight into disease process. Alert and Oriented x 3. pleasant and cooperative. General Notes: Upon  inspection patient's wound bed actually showed signs of good granulation epithelization at this point. Fortunately I do not see any signs of worsening at this time which is good news. I  do not think that she is doing poorly at all at this point. Integumentary (Hair, Skin) Wound #1 status is Open. Original cause of wound was Trauma. The date acquired was: 10/08/2022. The wound has been in treatment 10 weeks. The wound is located on the Right,Midline Lower Leg. The wound measures 0.1cm length x 0.1cm width x 0.1cm depth; 0.008cm^2 area and 0.001cm^3 volume. There is a medium amount of serosanguineous drainage noted. Assessment Active Problems ICD-10 Laceration without foreign body, right lower leg, initial encounter [name], [name] ([id number]) [id number] Page 6 of 7 Non-pressure chronic ulcer of other part of right lower leg with fat layer exposed Elevated blood-pressure reading, without diagnosis of hypertension Plan Follow-up Appointments: Return Appointment in 1 week. Nurse Visit as needed Bathing/ Shower/ Hygiene: May shower; gently cleanse wound with antibacterial soap, rinse and pat dry prior to dressing wounds Anesthetic (Use 'Patient Medications' Section for Anesthetic Order Entry): Lidocaine applied to wound bed Additional Orders / Instructions: Follow Nutritious Diet and Increase Protein Intake WOUND #1: - Lower Leg Wound Laterality: Right, Midline Cleanser: Vashe 5.8 (oz) 1 x Per Day/30 Days Discharge Instructions: Use vashe 5.8 (oz) as directed Peri-Wound Care: AandD Ointment 1 x Per Day/30 Days Discharge Instructions: apply tiny dot over prisma Prim Dressing: Prisma 4.34 (in) 1 x Per Day/30 Days ary Discharge Instructions: Moisten w/normal saline or sterile water; Cover wound as directed. Do not remove from wound bed. Secondary Dressing: (BORDER) Zetuvit Plus SILICONE BORDER Dressing 4x4 (in/in) 1 x Per Day/30 Days Discharge Instructions: Please  do not put silicone bordered dressings under wraps. Use non-bordered dressing only. Secondary Dressing: Hydrogel 1 x Per Day/30 Days 1. I am recommend that we have the patient continue to monitor for any signs of infection or worsening. Based on what I am seeing I do believe that her making excellent headway towards complete closure. 2. I will also recommend we continue with the collagen followed by AandD ointment over to help prevent this from drying out which I think will be better as well. We will see patient back for reevaluation in 1 week here in the clinic. If anything worsens or changes patient will contact our office for additional recommendations. Electronic Signature(s) Signed: 12/27/2022 9:26:22 AM By: Allen Derry PA-C Entered By: Allen Derry on 12/27/2022 06:26:22 -------------------------------------------------------------------------------- SuperBill Details Patient Name: Date of Service: [name], Meredith [name] 12/27/2022 Medical Record Number: [id number] Patient Account Number: [id number] Date of Birth/Sex: Treating RN: Feb 12, 1951 (72 y.o. [name] Primary Care Provider: Daniel Nones Other Clinician: Betha Loa Referring Provider: Treating Provider/Extender: Lois Huxley in Treatment: 10 Diagnosis Coding ICD-10 Codes Code Description [phone] Laceration without foreign body, right lower leg, initial encounter L97.812 Non-pressure chronic ulcer of other part of right lower leg with fat layer exposed R03.0 Elevated blood-pressure reading, without diagnosis of hypertension Facility Procedures : [name], [name] Code: [id number] Maxcine Ham ([id number]) Description: [phone] - WOUND CARE VISIT-LEV 2 EST PT [phone] Modifier: [id number] Pa Quantity: 1 ge 7 of 7 Physician Procedures : CPT4 Code Description Modifier [phone] [id number] - WC PHYS LEVEL 3 - EST PT ICD-10 Diagnosis Description S81.811A Laceration without foreign body, right lower leg,  initial encounter L97.812 Non-pressure chronic ulcer of other part of right lower leg with  fat layer exposed R03.0 Elevated blood-pressure reading, without diagnosis of hypertension Quantity: 1 Electronic Signature(s) Signed: 12/27/2022 9:26:34 AM By: Allen Derry PA-C Entered By: Allen Derry on 12/27/2022 28:78:67

## 2022-12-27 NOTE — Progress Notes (Signed)
MAEVRY, Prince ([id number]) [id number] Page 1 of 9 Visit Report for 12/27/2022 Arrival Information Details Patient Name: Date of Service: [name], [place] [name] 12/27/2022 8:30 A M Medical Record Number: [id number] Patient Account Number: [id number] Date of Birth/Sex: Treating RN: 04-Dec-1950 (72 y.o. [name] Primary Care Bartholomew Ramesh: Daniel Nones Other Clinician: Betha Loa Referring [name]: Treating [name]/Extender: [name] in Treatment: 10 Visit Information History Since Last Visit All ordered tests and consults were completed: No Patient Arrived: Ambulatory Added or deleted any medications: No Arrival Time: 08:37 Any new allergies or adverse reactions: No Transfer Assistance: None Had a fall or experienced change in No Patient Identification Verified: Yes activities of daily living that may affect Secondary Verification Process Completed: Yes risk of falls: Patient Requires Transmission-Based Precautions: No Signs or symptoms of abuse/neglect since last visito No Patient Has Alerts: Yes Hospitalized since last visit: No Patient Alerts: ABI 11/01/22 AVVS Implantable device outside of the clinic excluding No R: 1.13 L:1.25 cellular tissue based products placed in the center TBI R 0.64 L 0.92 since last visit: Has Dressing in Place as Prescribed: Yes Pain Present Now: No Electronic Signature(s) Signed: 12/27/2022 1:51:46 PM By: Betha Loa Entered By: Betha Loa on 12/27/2022 05:38:06 -------------------------------------------------------------------------------- Clinic Level of Care Assessment Details Patient Name: Date of ServiceNancie Prince [place] J. 12/27/2022 8:30 A M Medical Record Number: [id number] Patient Account Number: [id number] Date of Birth/Sex: Treating RN: [birth year] (72 y.o. [name] Primary Care Khoen Genet: Daniel Nones Other Clinician: Betha Loa Referring Gretta Samons: Treating  Minyon Billiter/Extender: [name] in Treatment: 10 Clinic Level of Care Assessment Items TOOL 4 Quantity Score []  - 0 Use when only an EandM is performed on FOLLOW-UP visit ASSESSMENTS - Nursing Assessment / Reassessment X- 1 10 Reassessment of Co-morbidities (includes updates in patient status) X- 1 5 Reassessment of Adherence to Treatment Plan Meredith, Prince ([id number]) [id number] Page 2 of 9 ASSESSMENTS - Wound and Skin A ssessment / Reassessment X - Simple Wound Assessment / Reassessment - one wound 1 5 []  - 0 Complex Wound Assessment / Reassessment - multiple wounds []  - 0 Dermatologic / Skin Assessment (not related to wound area) ASSESSMENTS - Focused Assessment []  - 0 Circumferential Edema Measurements - multi extremities []  - 0 Nutritional Assessment / Counseling / Intervention []  - 0 Lower Extremity Assessment (monofilament, tuning fork, pulses) []  - 0 Peripheral Arterial Disease Assessment (using hand held doppler) ASSESSMENTS - Ostomy and/or Continence Assessment and Care []  - 0 Incontinence Assessment and Management []  - 0 Ostomy Care Assessment and Management (repouching, etc.) PROCESS - Coordination of Care X - Simple Patient / Family Education for ongoing care 1 15 []  - 0 Complex (extensive) Patient / Family Education for ongoing care []  - 0 Staff obtains Chiropractor, Records, T Results / Process Orders est []  - 0 Staff telephones HHA, Nursing Homes / Clarify orders / etc []  - 0 Routine Transfer to another Facility (non-emergent condition) []  - 0 Routine Hospital Admission (non-emergent condition) []  - 0 New Admissions / Manufacturing engineer / Ordering NPWT Apligraf, etc. , []  - 0 Emergency Hospital Admission (emergent condition) X- 1 10 Simple Discharge Coordination []  - 0 Complex (extensive) Discharge Coordination PROCESS - Special Needs []  - 0 Pediatric / Minor Patient Management []  - 0 Isolation  Patient Management []  - 0 Hearing / Language / Visual special needs []  - 0 Assessment of Community assistance (transportation, D/C planning, etc.) []  - 0 Additional assistance /  Altered mentation []  - 0 Support Surface(s) Assessment (bed, cushion, seat, etc.) INTERVENTIONS - Wound Cleansing / Measurement X - Simple Wound Cleansing - one wound 1 5 []  - 0 Complex Wound Cleansing - multiple wounds X- 1 5 Wound Imaging (photographs - any number of wounds) []  - 0 Wound Tracing (instead of photographs) X- 1 5 Simple Wound Measurement - one wound []  - 0 Complex Wound Measurement - multiple wounds INTERVENTIONS - Wound Dressings X - Small Wound Dressing one or multiple wounds 1 10 []  - 0 Medium Wound Dressing one or multiple wounds []  - 0 Large Wound Dressing one or multiple wounds []  - 0 Application of Medications - topical []  - 0 Application of Medications - injection INTERVENTIONS - Miscellaneous []  - 0 External ear exam Meredith, Prince ([id number]) [id number] Page 3 of 9 []  - 0 Specimen Collection (cultures, biopsies, blood, body fluids, etc.) []  - 0 Specimen(s) / Culture(s) sent or taken to Lab for analysis []  - 0 Patient Transfer (multiple staff / Michiel Sites Lift / Similar devices) []  - 0 Simple Staple / Suture removal (25 or less) []  - 0 Complex Staple / Suture removal (26 or more) []  - 0 Hypo / Hyperglycemic Management (close monitor of Blood Glucose) []  - 0 Ankle / Brachial Index (ABI) - do not check if billed separately X- 1 5 Vital Signs Has the patient been seen at the hospital within the last three years: Yes Total Score: 75 Level Of Care: New/Established - Level 2 Electronic Signature(s) Signed: 12/27/2022 1:51:46 PM By: Betha Loa Entered By: Betha Loa on 12/27/2022 06:19:06 -------------------------------------------------------------------------------- Encounter Discharge Information Details Patient Name: Date of  Service: [name], [name] 12/27/2022 8:30 A M Medical Record Number: [id number] Patient Account Number: [id number] Date of Birth/Sex: Treating RN: [birth year] (72 y.o. [name] Primary Care [name]: Daniel Nones Other Clinician: Betha Loa Referring Prarthana Parlin: Treating Arnel Wymer/Extender: [name] in Treatment: 10 Encounter Discharge Information Items Discharge Condition: Stable Ambulatory Status: Ambulatory Discharge Destination: Home Transportation: Private Auto Accompanied By: self Schedule Follow-up Appointment: Yes Clinical Summary of Care: Electronic Signature(s) Signed: 12/27/2022 1:51:46 PM By: Betha Loa Entered By: Betha Loa on 12/27/2022 06:28:48 Lower Extremity Assessment Details -------------------------------------------------------------------------------- [name] ([id number]) [id number] Page 4 of 9 Patient Name: Date of Service: [name], [place] [name] 12/27/2022 8:30 A M Medical Record Number: [id number] Patient Account Number: [id number] Date of Birth/Sex: Treating RN: Sep 04, 1950 (72 y.o. [name] Primary Care Jerney Baksh: Daniel Nones Other Clinician: Betha Loa Referring Kynzley Dowson: Treating Yasmina Chico/Extender: [name] in Treatment: 10 Edema Assessment Left: Right: Assessed: No Yes Edema: Yes Calf Left: Right: Point of Measurement: 34 cm From Medial Instep 32.6 cm Ankle Left: Right: Point of Measurement: 12 cm From Medial Instep 19.7 cm Vascular Assessment Left: Right: Pulses: Dorsalis Pedis Palpable: Yes Toe Nail Assessment Left: Right: Thick: No Discolored: No Deformed: No Improper Length and Hygiene: No Electronic Signature(s) Signed: 12/27/2022 1:51:46 PM By: Betha Loa Signed: 12/27/2022 1:54:26 PM By: Angelina Pih Entered By: Betha Loa on 12/27/2022  05:45:41 -------------------------------------------------------------------------------- Multi Wound Chart Details Patient Name: Date of Service: [name], [name] 12/27/2022 8:30 A M Medical Record Number: [id number] Patient Account Number: [id number] Date of Birth/Sex: Treating RN: Feb 12, 1951 (72 y.o. [name] Primary Care Cordarrius Coad: Daniel Nones Other Clinician: Betha Loa Referring Deanne Bedgood: Treating Amalee Olsen/Extender: [name] in Treatment: 10 Vital Signs Height(in): Pulse(bpm): 73 Weight(lbs): 128 Blood Pressure(mmHg): 164/91 Body Mass  Index(BMI): Temperature(F): 97.7 Respiratory Rate(breaths/min): 18 [1:Photos:] [N/A:N/A] Right, Midline Lower Leg N/A N/A Wound Location: Trauma N/A N/A Wounding Event: Trauma, Other N/A N/A Primary Etiology: Chronic sinus problems/congestion N/A N/A Comorbid History: 10/08/2022 N/A N/A Date Acquired: 10 N/A N/A Weeks of Treatment: Open N/A N/A Wound Status: No N/A N/A Wound Recurrence: Yes N/A N/A Clustered Wound: 0.1x0.1x0.1 N/A N/A Measurements L x W x D (cm) 0.008 N/A N/A A (cm) : rea 0.001 N/A N/A Volume (cm) : 100.00% N/A N/A % Reduction in A rea: 100.00% N/A N/A % Reduction in Volume: Full Thickness With Exposed Support N/A N/A Classification: Structures Medium N/A N/A Exudate Amount: Serosanguineous N/A N/A Exudate Type: red, brown N/A N/A Exudate Color: Treatment Notes Electronic Signature(s) Signed: 12/27/2022 1:51:46 PM By: Betha Loa Entered By: Betha Loa on 12/27/2022 05:45:48 -------------------------------------------------------------------------------- Multi-Disciplinary Care Plan Details Patient Name: Date of Service: [name], [name] 12/27/2022 8:30 A M Medical Record Number: [id number] Patient Account Number: [id number] Date of Birth/Sex: Treating RN: 02/09/1951 (72 y.o. [name] Primary Care Nada Godley: Daniel Nones Other Clinician: Betha Loa Referring Whitnie Deleon: Treating Jarvis Sawa/Extender: [name] in Treatment: 10 Active Inactive Wound/Skin Impairment Nursing Diagnoses: Impaired tissue integrity Goals: Patient/caregiver will verbalize understanding of skin care regimen Date Initiated: 10/18/2022 Date Inactivated: 11/01/2022 Target Resolution Date: 10/18/2022 Goal Status: Met Ulcer/skin breakdown will have a volume reduction of 30% by week 4 Date Initiated: 10/18/2022 Date Inactivated: 11/25/2022 Target Resolution Date: 11/08/2022 Goal Status: Met Ulcer/skin breakdown will have a volume reduction of 50% by week 8 Date Initiated: 10/18/2022 Date Inactivated: 11/29/2022 Target Resolution Date: 12/06/2022 Goal Status: Met Ulcer/skin breakdown will have a volume reduction of 80% by week 12 Date Initiated: 10/18/2022 Target Resolution Date: 01/03/2023 Meredith, Prince ([id number]) [id number] Page 6 of 9 Goal Status: Active Ulcer/skin breakdown will heal within 14 weeks Date Initiated: 10/18/2022 Target Resolution Date: 01/16/2023 Goal Status: Active Interventions: Assess patient/caregiver ability to obtain necessary supplies Assess patient/caregiver ability to perform ulcer/skin care regimen upon admission and as needed Assess ulceration(s) every visit Provide education on ulcer and skin care Treatment Activities: Referred to DME Ellen Goris for dressing supplies : 10/18/2022 Skin care regimen initiated : 10/18/2022 Notes: Electronic Signature(s) Signed: 12/27/2022 1:51:46 PM By: Betha Loa Signed: 12/27/2022 1:54:26 PM By: Angelina Pih Entered By: Betha Loa on 12/27/2022 06:19:33 -------------------------------------------------------------------------------- Pain Assessment Details Patient Name: Date of Service: [name], [name] 12/27/2022 8:30 A M Medical Record Number: [id number] Patient Account Number: [id number] Date of Birth/Sex: Treating RN: 04-20-1951 (72  y.o. [name] Primary Care Harleyquinn Gasser: Daniel Nones Other Clinician: Betha Loa Referring Jeromy Borcherding: Treating Derrel Moore/Extender: [name] in Treatment: 10 Active Problems Location of Pain Severity and Description of Pain Patient Has Paino No Site Locations Pain Management and Medication Current Pain Management: Electronic Signature(s) Signed: 12/27/2022 1:51:46 PM By: Betha Loa Signed: 12/27/2022 1:54:26 PM By: Megan Mans ([id number]) [id number] Page 7 of 9 Signed: 12/27/2022 1:54:26 PM By: Angelina Pih Entered By: Betha Loa on 12/27/2022 05:40:27 -------------------------------------------------------------------------------- Patient/Caregiver Education Details Patient Name: Date of Service: [name], DIA NNA Shela Commons 8/23/2024andnbsp8:30 A M Medical Record Number: [id number] Patient Account Number: [id number] Date of Birth/Gender: Treating RN: Jan 03, 1951 (72 y.o. [name] Primary Care Physician: Daniel Nones Other Clinician: Betha Loa Referring Physician: Treating Physician/Extender: [name] in Treatment: 10 Education Assessment Education Provided To: Patient Education Topics Provided Wound/Skin Impairment: Handouts: Other: continue wound care as directed Methods:  Explain/Verbal Responses: State content correctly Electronic Signature(s) Signed: 12/27/2022 1:51:46 PM By: Betha Loa Entered By: Betha Loa on 12/27/2022 06:20:45 -------------------------------------------------------------------------------- Wound Assessment Details Patient Name: Date of Service: [name], [name] 12/27/2022 8:30 A M Medical Record Number: [id number] Patient Account Number: [id number] Date of Birth/Sex: Treating RN: [birth year] (72 y.o. [name] Primary Care Kamari Buch: Daniel Nones Other Clinician: Betha Loa Referring Angelica Wix: Treating  Florentino Laabs/Extender: [name] in Treatment: 10 Wound Status Wound Number: 1 Primary Etiology: Trauma, Other Wound Location: Right, Midline Lower Leg Wound Status: Open Wounding Event: Trauma Comorbid History: Chronic sinus problems/congestion Date Acquired: 10/08/2022 Weeks Of Treatment: 10 Clustered Wound: Yes Photos Meredith, Prince ([id number]) [id number] Page 8 of 9 Wound Measurements Length: (cm) 0.1 Width: (cm) 0.1 Depth: (cm) 0.1 Area: (cm) 0.008 Volume: (cm) 0.001 % Reduction in Area: 100% % Reduction in Volume: 100% Wound Description Classification: Full Thickness With Exposed Support Exudate Amount: Medium Exudate Type: Serosanguineous Exudate Color: red, brown Structures Treatment Notes Wound #1 (Lower Leg) Wound Laterality: Right, Midline Cleanser Vashe 5.8 (oz) Discharge Instruction: Use vashe 5.8 (oz) as directed Peri-Wound Care AandD Ointment Discharge Instruction: apply tiny dot over prisma Topical Primary Dressing Prisma 4.34 (in) Discharge Instruction: Moisten w/normal saline or sterile water; Cover wound as directed. Do not remove from wound bed. Secondary Dressing (BORDER) Zetuvit Plus SILICONE BORDER Dressing 4x4 (in/in) Discharge Instruction: Please do not put silicone bordered dressings under wraps. Use non-bordered dressing only. Secured With Compression Wrap Compression Stockings Facilities manager) Signed: 12/27/2022 1:51:46 PM By: Betha Loa Signed: 12/27/2022 1:54:26 PM By: Angelina Pih Entered By: Betha Loa on 12/27/2022 05:43:48 [name] ([id number]) [id number] Page 9 of 9 -------------------------------------------------------------------------------- Vitals Details Patient Name: Date of Service: [name], [place] [name] 12/27/2022 8:30 A M Medical Record Number: [id number] Patient Account Number: [id number] Date of Birth/Sex: Treating  RN: March 18, 1951 (72 y.o. [name] Primary Care Othello Dickenson: Daniel Nones Other Clinician: Betha Loa Referring Rushil Kimbrell: Treating Amadea Keagy/Extender: [name] in Treatment: 10 Vital Signs Time Taken: 08:38 Temperature (F): 97.7 Weight (lbs): 128 Pulse (bpm): 73 Respiratory Rate (breaths/min): 18 Blood Pressure (mmHg): 164/91 Reference Range: 80 - 120 mg / dl Electronic Signature(s) Signed: 12/27/2022 1:51:46 PM By: Betha Loa Entered By: Betha Loa on 12/27/2022 05:40:18

## 2022-12-31 ENCOUNTER — Ambulatory Visit (INDEPENDENT_AMBULATORY_CARE_PROVIDER_SITE_OTHER): Payer: Self-pay | Admitting: Dermatology

## 2022-12-31 DIAGNOSIS — L988 Other specified disorders of the skin and subcutaneous tissue: Secondary | ICD-10-CM

## 2022-12-31 NOTE — Patient Instructions (Signed)

## 2022-12-31 NOTE — Progress Notes (Signed)
   Follow-Up Visit   Subjective  [name] is a 72 y.o. female who presents for the following: filler and botox for facial elastosis  The following portions of the chart were reviewed this encounter and updated as appropriate: medications, allergies, medical history  Review of Systems:  No other skin or systemic complaints except as noted in HPI or Assessment and Plan.  Objective  Well appearing patient in no apparent distress; mood and affect are within normal limits.  A focused examination was performed of the face. Relevant physical exam findings are noted in the Assessment and Plan or shown in photos.  Before photos             After photos           Injection map photo    Assessment & Plan    Facial Elastosis  Botox 27.5 units injected as marked:  - Frown complex 27.5 units  Location: frown complex  Informed consent: Discussed risks (infection, pain, bleeding, bruising, swelling, allergic reaction, paralysis of nearby muscles, eyelid droop, double vision, neck weakness, difficulty breathing, headache, undesirable cosmetic result, and need for additional treatment) and benefits of the procedure, as well as the alternatives.  Informed consent was obtained.  Preparation: The area was cleansed with alcohol.  Procedure Details:  Botox was injected into the dermis with a 30-gauge needle. Pressure applied to any bleeding. Ice packs offered for swelling.  Lot Number:  [id number] Expiration:  09/26  Total Units Injected:  27.5 units  Plan: Tylenol may be used for headache.  Allow 2 weeks before returning to clinic for additional dosing as needed. Patient will call for any problems.   Prior to the procedure, the patient's past medical history, allergies and the rare but potential risks and complications were reviewed with the patient and a signed consent was obtained. Pre and post-treatment care was discussed and instructions provided.   Restylane  Refyne 1cc injected today to: - Bil corners of mouth - Bil marionette lines - Chin  Location: bil corners of mouth, bil marionette lines, chin  Filler Type: Restylane refyne lot [id number], exp 01/04/24  Procedure: The area was prepped thoroughly with Puracyn. After introducing the needle into the desired treatment area, the syringe plunger was drawn back to ensure there was no flash of blood prior to injecting the filler in order to minimize risk of intravascular injection and vascular occlusion. After injection of the filler, the treated areas were cleansed and iced to reduce swelling. Post-treatment instructions were reviewed with the patient.       Patient tolerated the procedure well. The patient will call with any problems, questions or concerns prior to their next appointment.    Return in about 6 months (around 07/03/2023) for Botox and filler.  I, Ardis Rowan, RMA, am acting as scribe for Armida Sans, MD .   Documentation: I have reviewed the above documentation for accuracy and completeness, and I agree with the above.  Armida Sans, MD

## 2023-01-01 ENCOUNTER — Ambulatory Visit: Payer: PRIVATE HEALTH INSURANCE

## 2023-01-01 DIAGNOSIS — M6281 Muscle weakness (generalized): Secondary | ICD-10-CM

## 2023-01-01 DIAGNOSIS — M5459 Other low back pain: Secondary | ICD-10-CM

## 2023-01-01 DIAGNOSIS — R2689 Other abnormalities of gait and mobility: Secondary | ICD-10-CM

## 2023-01-01 DIAGNOSIS — R262 Difficulty in walking, not elsewhere classified: Secondary | ICD-10-CM
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
## 2023-01-02 ENCOUNTER — Ambulatory Visit: Payer: PRIVATE HEALTH INSURANCE | Attending: Orthopedic Surgery

## 2023-01-02 DIAGNOSIS — R262 Difficulty in walking, not elsewhere classified: Secondary | ICD-10-CM | POA: Diagnosis present

## 2023-01-02 DIAGNOSIS — M6281 Muscle weakness (generalized): Secondary | ICD-10-CM | POA: Diagnosis present

## 2023-01-02 DIAGNOSIS — M5459 Other low back pain: Secondary | ICD-10-CM | POA: Insufficient documentation
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
## 2023-01-02 NOTE — Progress Notes (Signed)
Meredith, Prince ([id number]) [id number] Page 1 of 9 Visit Report for 12/20/2022 Arrival Information Details Patient Name: Date of Service: [name], [place] [name] 12/20/2022 8:30 A M Medical Record Number: [id number] Patient Account Number: [id number] Date of Birth/Sex: Treating RN: November 25, 1950 (72 y.o. [name], [name] Primary Care Musab Wingard: Daniel Nones Other Clinician: Betha Loa Referring Neldon Shepard: Treating Brittanni Cariker/Extender: Lois Huxley in Treatment: 9 Visit Information History Since Last Visit All ordered tests and consults were completed: No Patient Arrived: Ambulatory Added or deleted any medications: No Arrival Time: 08:33 Any new allergies or adverse reactions: No Transfer Assistance: None Had a fall or experienced change in No Patient Identification Verified: Yes activities of daily living that may affect Secondary Verification Process Completed: Yes risk of falls: Patient Requires Transmission-Based Precautions: No Signs or symptoms of abuse/neglect since last visito No Patient Has Alerts: Yes Hospitalized since last visit: No Patient Alerts: ABI 11/01/22 AVVS Implantable device outside of the clinic excluding No R: 1.13 L:1.25 cellular tissue based products placed in the center TBI R 0.64 L 0.92 since last visit: Has Dressing in Place as Prescribed: Yes Pain Present Now: No Electronic Signature(s) Signed: 12/23/2022 4:40:06 PM By: Betha Loa Entered By: Betha Loa on 12/20/2022 08:33:45 -------------------------------------------------------------------------------- Clinic Level of Care Assessment Details Patient Name: Date of ServiceNancie Prince [name] 12/20/2022 8:30 A M Medical Record Number: [id number] Patient Account Number: [id number] Date of Birth/Sex: Treating RN: 1951-01-12 (72 y.o. Skip Mayer Primary Care Kaesen Rodriguez: Daniel Nones Other Clinician: Betha Loa Referring [name]: Treating  [name]/Extender: Lois Huxley in Treatment: 9 Clinic Level of Care Assessment Items TOOL 4 Quantity Score []  - 0 Use when only an EandM is performed on FOLLOW-UP visit ASSESSMENTS - Nursing Assessment / Reassessment X- 1 10 Reassessment of Co-morbidities (includes updates in patient status) X- 1 5 Reassessment of Adherence to Treatment Plan EARNESTEEN, [name] [id number] Page 2 of 9 ASSESSMENTS - Wound and Skin A ssessment / Reassessment X - Simple Wound Assessment / Reassessment - one wound 1 5 []  - 0 Complex Wound Assessment / Reassessment - multiple wounds []  - 0 Dermatologic / Skin Assessment (not related to wound area) ASSESSMENTS - Focused Assessment []  - 0 Circumferential Edema Measurements - multi extremities []  - 0 Nutritional Assessment / Counseling / Intervention []  - 0 Lower Extremity Assessment (monofilament, tuning fork, pulses) []  - 0 Peripheral Arterial Disease Assessment (using hand held doppler) ASSESSMENTS - Ostomy and/or Continence Assessment and Care []  - 0 Incontinence Assessment and Management []  - 0 Ostomy Care Assessment and Management (repouching, etc.) PROCESS - Coordination of Care X - Simple Patient / Family Education for ongoing care 1 15 []  - 0 Complex (extensive) Patient / Family Education for ongoing care []  - 0 Staff obtains Chiropractor, Records, T Results / Process Orders est []  - 0 Staff telephones HHA, Nursing Homes / Clarify orders / etc []  - 0 Routine Transfer to another Facility (non-emergent condition) []  - 0 Routine Hospital Admission (non-emergent condition) []  - 0 New Admissions / Manufacturing engineer / Ordering NPWT Apligraf, etc. , []  - 0 Emergency Hospital Admission (emergent condition) X- 1 10 Simple Discharge Coordination []  - 0 Complex (extensive) Discharge Coordination PROCESS - Special Needs []  - 0 Pediatric / Minor Patient Management []  - 0 Isolation  Patient Management []  - 0 Hearing / Language / Visual special needs []  - 0 Assessment of Community assistance (transportation, D/C planning, etc.) []  - 0 Additional assistance /  Altered mentation []  - 0 Support Surface(s) Assessment (bed, cushion, seat, etc.) INTERVENTIONS - Wound Cleansing / Measurement X - Simple Wound Cleansing - one wound 1 5 []  - 0 Complex Wound Cleansing - multiple wounds X- 1 5 Wound Imaging (photographs - any number of wounds) []  - 0 Wound Tracing (instead of photographs) X- 1 5 Simple Wound Measurement - one wound []  - 0 Complex Wound Measurement - multiple wounds INTERVENTIONS - Wound Dressings X - Small Wound Dressing one or multiple wounds 1 10 []  - 0 Medium Wound Dressing one or multiple wounds []  - 0 Large Wound Dressing one or multiple wounds []  - 0 Application of Medications - topical []  - 0 Application of Medications - injection INTERVENTIONS - Miscellaneous []  - 0 External ear exam Meredith, Prince ([id number]) [id number] Page 3 of 9 []  - 0 Specimen Collection (cultures, biopsies, blood, body fluids, etc.) []  - 0 Specimen(s) / Culture(s) sent or taken to Lab for analysis []  - 0 Patient Transfer (multiple staff / Michiel Sites Lift / Similar devices) []  - 0 Simple Staple / Suture removal (25 or less) []  - 0 Complex Staple / Suture removal (26 or more) []  - 0 Hypo / Hyperglycemic Management (close monitor of Blood Glucose) []  - 0 Ankle / Brachial Index (ABI) - do not check if billed separately X- 1 5 Vital Signs Has the patient been seen at the hospital within the last three years: Yes Total Score: 75 Level Of Care: New/Established - Level 2 Electronic Signature(s) Signed: 12/23/2022 4:40:06 PM By: Betha Loa Entered By: Betha Loa on 12/20/2022 08:50:27 -------------------------------------------------------------------------------- Encounter Discharge Information Details Patient Name: Date of  Service: [name], [name] 12/20/2022 8:30 A M Medical Record Number: [id number] Patient Account Number: [id number] Date of Birth/Sex: Treating RN: [birth year] (72 y.o. [name], [name] Primary Care Adan Beal: Daniel Nones Other Clinician: Betha Loa Referring Jekhi Bolin: Treating Capri Veals/Extender: Lois Huxley in Treatment: 9 Encounter Discharge Information Items Discharge Condition: Stable Ambulatory Status: Ambulatory Discharge Destination: Home Transportation: Private Auto Accompanied By: self Schedule Follow-up Appointment: Yes Clinical Summary of Care: Electronic Signature(s) Signed: 12/23/2022 4:40:06 PM By: Betha Loa Entered By: Betha Loa on 12/20/2022 08:59:06 Lower Extremity Assessment Details -------------------------------------------------------------------------------- Edgerton Nation ([id number]) [id number] Page 4 of 9 Patient Name: Date of Service: [name], [place] [name] 12/20/2022 8:30 A M Medical Record Number: [id number] Patient Account Number: [id number] Date of Birth/Sex: Treating RN: [birth year] (72 y.o. [name], [name] Primary Care Zriyah Kopplin: Daniel Nones Other Clinician: Betha Loa Referring Lyrick Lagrand: Treating Genean Adamski/Extender: [name] in Treatment: 9 Edema Assessment Left: Right: Assessed: No Yes Edema: Yes Calf Left: Right: Point of Measurement: 34 cm From Medial Instep 33 cm Ankle Left: Right: Point of Measurement: 12 cm From Medial Instep 19.4 cm Vascular Assessment Left: Right: Pulses: Dorsalis Pedis Palpable: Yes Toe Nail Assessment Left: Right: Thick: No Discolored: No Deformed: No Improper Length and Hygiene: No Electronic Signature(s) Signed: 12/23/2022 4:40:06 PM By: Betha Loa Signed: 01/01/2023 5:37:35 PM By: Elliot Gurney, BSN, RN, CWS, Kim RN, BSN Entered By: Betha Loa on 12/20/2022  08:42:53 -------------------------------------------------------------------------------- Multi Wound Chart Details Patient Name: Date of Service: [name], [name] 12/20/2022 8:30 A M Medical Record Number: [id number] Patient Account Number: [id number] Date of Birth/Sex: Treating RN: June 11, 1950 (72 y.o. Skip Mayer Primary Care Jerelene Salaam: Daniel Nones Other Clinician: Betha Loa Referring Bertina Guthridge: Treating Milka Windholz/Extender: [name] in Treatment: 9 Vital Signs Height(in): Pulse(bpm): 72 Weight(lbs): 128  Blood Pressure(mmHg): 167/97 Body Mass Index(BMI): Temperature(F): 98.0 Respiratory Rate(breaths/min): 16 [1:Photos:] [N/A:N/A] Right, Midline Lower Leg N/A N/A Wound Location: Trauma N/A N/A Wounding Event: Trauma, Other N/A N/A Primary Etiology: Chronic sinus problems/congestion N/A N/A Comorbid History: 10/08/2022 N/A N/A Date Acquired: 9 N/A N/A Weeks of Treatment: Open N/A N/A Wound Status: No N/A N/A Wound Recurrence: Yes N/A N/A Clustered Wound: 0.5x0.1x0.1 N/A N/A Measurements L x W x D (cm) 0.039 N/A N/A A (cm) : rea 0.004 N/A N/A Volume (cm) : 99.80% N/A N/A % Reduction in A rea: 99.90% N/A N/A % Reduction in Volume: Full Thickness With Exposed Support N/A N/A Classification: Structures Medium N/A N/A Exudate Amount: Serosanguineous N/A N/A Exudate Type: red, brown N/A N/A Exudate Color: Large (67-100%) N/A N/A Granulation Amount: Pink, Pale N/A N/A Granulation Quality: None Present (0%) N/A N/A Necrotic Amount: Fat Layer (Subcutaneous Tissue): Yes N/A N/A Exposed Structures: Fascia: No Tendon: No Muscle: No Joint: No Bone: No None N/A N/A Epithelialization: Treatment Notes Electronic Signature(s) Signed: 12/23/2022 4:40:06 PM By: Betha Loa Entered By: Betha Loa on 12/20/2022 08:42:58 -------------------------------------------------------------------------------- Multi-Disciplinary Care Plan  Details Patient Name: Date of Service: [name], [name] 12/20/2022 8:30 A M Medical Record Number: [id number] Patient Account Number: [id number] Date of Birth/Sex: Treating RN: 1950-11-02 (72 y.o. Skip Mayer Primary Care Bryer Gottsch: Daniel Nones Other Clinician: Betha Loa Referring Reena Borromeo: Treating Alyza Artiaga/Extender: Lois Huxley in Treatment: 9 Active Inactive Wound/Skin Impairment Nursing Diagnoses: Impaired tissue integrity Goals: Patient/caregiver will verbalize understanding of skin care regimen Date Initiated: 10/18/2022 Date Inactivated: 11/01/2022 Target Resolution Date: 10/18/2022 Goal Status: Met CAYCIE, ESPAILLAT ([id number]) [id number] Page 6 of 9 Ulcer/skin breakdown will have a volume reduction of 30% by week 4 Date Initiated: 10/18/2022 Date Inactivated: 11/25/2022 Target Resolution Date: 11/08/2022 Goal Status: Met Ulcer/skin breakdown will have a volume reduction of 50% by week 8 Date Initiated: 10/18/2022 Date Inactivated: 11/29/2022 Target Resolution Date: 12/06/2022 Goal Status: Met Ulcer/skin breakdown will have a volume reduction of 80% by week 12 Date Initiated: 10/18/2022 Target Resolution Date: 01/03/2023 Goal Status: Active Ulcer/skin breakdown will heal within 14 weeks Date Initiated: 10/18/2022 Target Resolution Date: 01/16/2023 Goal Status: Active Interventions: Assess patient/caregiver ability to obtain necessary supplies Assess patient/caregiver ability to perform ulcer/skin care regimen upon admission and as needed Assess ulceration(s) every visit Provide education on ulcer and skin care Treatment Activities: Referred to DME Jason Frisbee for dressing supplies : 10/18/2022 Skin care regimen initiated : 10/18/2022 Notes: Electronic Signature(s) Signed: 12/23/2022 4:40:06 PM By: Betha Loa Signed: 01/01/2023 5:37:35 PM By: Elliot Gurney, BSN, RN, CWS, Kim RN, BSN Entered By: Betha Loa on 12/20/2022  08:52:04 -------------------------------------------------------------------------------- Pain Assessment Details Patient Name: Date of Service: [name], [name] 12/20/2022 8:30 A M Medical Record Number: [id number] Patient Account Number: [id number] Date of Birth/Sex: Treating RN: 04/25/1951 (72 y.o. [name], [name] Primary Care Ahava Kissoon: Daniel Nones Other Clinician: Betha Loa Referring Isiac Breighner: Treating Trellis Vanoverbeke/Extender: [name] in Treatment: 9 Active Problems Location of Pain Severity and Description of Pain Patient Has Paino No Site Locations ZAMARIYAH, SCARPINATO ([id number]) [id number] Page 7 of 9 Pain Management and Medication Current Pain Management: Electronic Signature(s) Signed: 12/23/2022 4:40:06 PM By: Betha Loa Signed: 01/01/2023 5:37:35 PM By: Elliot Gurney, BSN, RN, CWS, Kim RN, BSN Entered By: Betha Loa on 12/20/2022 08:36:41 -------------------------------------------------------------------------------- Patient/Caregiver Education Details Patient Name: Date of Service: [name], DIA NNA Shela Commons 8/16/2024andnbsp8:30 A M Medical Record Number: [id number] Patient Account Number: [id number] Date  of Birth/Gender: Treating RN: March 30, 1951 (72 y.o. Skip Mayer Primary Care Physician: Daniel Nones Other Clinician: Betha Loa Referring Physician: Treating Physician/Extender: Lois Huxley in Treatment: 9 Education Assessment Education Provided To: Patient Education Topics Provided Wound/Skin Impairment: Handouts: Other: continue wound care as directed Methods: Explain/Verbal Responses: State content correctly Electronic Signature(s) Signed: 12/23/2022 4:40:06 PM By: Betha Loa Entered By: Betha Loa on 12/20/2022 08:58:15 -------------------------------------------------------------------------------- Wound Assessment Details Patient Name: Date of Service: [name], [name] 12/20/2022 8:30 A  M Medical Record Number: [id number] Patient Account Number: [id number] Date of Birth/Sex: Treating RN: [birth year] (72 y.o. Skip Mayer Primary Care Kingdavid Leinbach: Daniel Nones Other Clinician: Betha Loa Referring Shonique Pelphrey: Treating [place] Surges/Extender: [name] in Treatment: [address] Wound Status QUAN, SIERRA ([id number]) [id number] Page 8 of 9 Wound Number: 1 Primary Etiology: Trauma, Other Wound Location: Right, Midline Lower Leg Wound Status: Open Wounding Event: Trauma Comorbid History: Chronic sinus problems/congestion Date Acquired: 10/08/2022 Weeks Of Treatment: 9 Clustered Wound: Yes Photos Wound Measurements Length: (cm) 0.5 Width: (cm) 0.1 Depth: (cm) 0.1 Area: (cm) 0.039 Volume: (cm) 0.004 % Reduction in Area: 99.8% % Reduction in Volume: 99.9% Epithelialization: None Wound Description Classification: Full Thickness With Exposed Support Structures Exudate Amount: Medium Exudate Type: Serosanguineous Exudate Color: red, brown Foul Odor After Cleansing: No Slough/Fibrino No Wound Bed Granulation Amount: Large (67-100%) Exposed Structure Granulation Quality: Pink, Pale Fascia Exposed: No Necrotic Amount: None Present (0%) Fat Layer (Subcutaneous Tissue) Exposed: Yes Tendon Exposed: No Muscle Exposed: No Joint Exposed: No Bone Exposed: No Electronic Signature(s) Signed: 12/23/2022 4:40:06 PM By: Betha Loa Signed: 01/01/2023 5:37:35 PM By: Elliot Gurney, BSN, RN, CWS, Kim RN, BSN Entered By: Betha Loa on 12/20/2022 08:41:47 -------------------------------------------------------------------------------- Vitals Details Patient Name: Date of Service: [name], [name] 12/20/2022 8:30 A M Medical Record Number: [id number] Patient Account Number: [id number] Date of Birth/Sex: Treating RN: 07/04/1950 (72 y.o. [name], [name] Primary Care Roemello Speyer: Daniel Nones Other Clinician: Betha Loa Referring Fidelia Cathers: Treating  Dimetrius Montfort/Extender: [name] in Treatment: 9 Vital Signs Time Taken: 08:36 Temperature (F): 98.0 MEGGAN, ONEEL ([id number]) [id number] Page 9 of 9 Weight (lbs): 128 Pulse (bpm): 72 Respiratory Rate (breaths/min): 16 Blood Pressure (mmHg): 167/97 Reference Range: 80 - 120 mg / dl Electronic Signature(s) Signed: 12/23/2022 4:40:06 PM By: Betha Loa Entered By: Betha Loa on 12/20/2022 08:36:37

## 2023-01-03 ENCOUNTER — Ambulatory Visit: Payer: Medicare HMO | Admitting: Internal Medicine

## 2023-01-03 ENCOUNTER — Encounter: Payer: Self-pay | Admitting: Dermatology

## 2023-01-07 ENCOUNTER — Other Ambulatory Visit: Payer: Self-pay

## 2023-01-07 ENCOUNTER — Encounter: Payer: Medicare HMO | Admitting: Physical Therapy

## 2023-01-07 ENCOUNTER — Ambulatory Visit: Payer: PRIVATE HEALTH INSURANCE | Attending: Orthopedic Surgery

## 2023-01-07 DIAGNOSIS — R2689 Other abnormalities of gait and mobility: Secondary | ICD-10-CM | POA: Insufficient documentation

## 2023-01-07 DIAGNOSIS — R262 Difficulty in walking, not elsewhere classified: Secondary | ICD-10-CM | POA: Insufficient documentation

## 2023-01-07 DIAGNOSIS — M5459 Other low back pain: Secondary | ICD-10-CM | POA: Diagnosis present

## 2023-01-07 DIAGNOSIS — M6281 Muscle weakness (generalized): Secondary | ICD-10-CM | POA: Diagnosis present

## 2023-01-08 ENCOUNTER — Other Ambulatory Visit: Payer: Self-pay

## 2023-01-09 ENCOUNTER — Ambulatory Visit: Payer: PRIVATE HEALTH INSURANCE

## 2023-01-09 ENCOUNTER — Encounter: Payer: Medicare HMO | Admitting: Physical Therapy

## 2023-01-09 DIAGNOSIS — M5459 Other low back pain: Secondary | ICD-10-CM | POA: Diagnosis not present

## 2023-01-09 DIAGNOSIS — R2689 Other abnormalities of gait and mobility: Secondary | ICD-10-CM

## 2023-01-09 DIAGNOSIS — R262 Difficulty in walking, not elsewhere classified: Secondary | ICD-10-CM

## 2023-01-09 DIAGNOSIS — M6281 Muscle weakness (generalized): Secondary | ICD-10-CM
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
## 2023-01-10 ENCOUNTER — Other Ambulatory Visit: Payer: Self-pay

## 2023-01-10 ENCOUNTER — Encounter: Payer: PRIVATE HEALTH INSURANCE | Attending: Physician Assistant | Admitting: Physician Assistant

## 2023-01-10 DIAGNOSIS — R030 Elevated blood-pressure reading, without diagnosis of hypertension: Secondary | ICD-10-CM | POA: Insufficient documentation

## 2023-01-10 DIAGNOSIS — L97812 Non-pressure chronic ulcer of other part of right lower leg with fat layer exposed: Secondary | ICD-10-CM | POA: Insufficient documentation

## 2023-01-10 DIAGNOSIS — S81811A Laceration without foreign body, right lower leg, initial encounter: Secondary | ICD-10-CM | POA: Insufficient documentation

## 2023-01-10 DIAGNOSIS — Y9389 Activity, other specified: Secondary | ICD-10-CM | POA: Diagnosis not present

## 2023-01-10 DIAGNOSIS — W1839XA Other fall on same level, initial encounter: Secondary | ICD-10-CM | POA: Diagnosis not present

## 2023-01-10 MED ORDER — CYCLOBENZAPRINE HCL 5 MG PO TABS: 5.0000 mg | ORAL_TABLET | Freq: Every evening | ORAL | 1 refills | Status: DC

## 2023-01-10 MED ORDER — METHOCARBAMOL 500 MG PO TABS
500.0000 mg | ORAL_TABLET | Freq: Four times a day (QID) | ORAL | 1 refills | Status: DC
  Filled 2023-01-10: qty 60, 15d supply, fill #0

## 2023-01-10 MED ORDER — MELOXICAM 15 MG PO TABS: 15.0000 mg | ORAL_TABLET | Freq: Every day | ORAL | 1 refills | Status: DC

## 2023-01-10 NOTE — Progress Notes (Addendum)
MIKAIYA, STALLONE ([id number]) [id number] Page 1 of 7 Visit Report for 01/10/2023 Chief Complaint Document Details Patient Name: Date of Service: [name], [place] [name] 01/10/2023 8:30 A M Medical Record Number: [id number] Patient Account Number: [id number] Date of Birth/Sex: Treating RN: November 16, 1950 (72 y.o. Esmeralda Links Primary Care Provider: Daniel Nones Other Clinician: Betha Loa Referring Provider: Treating Provider/Extender: Lois Huxley in Treatment: 12 Information Obtained from: Patient Chief Complaint Right LE ulcer Electronic Signature(s) Signed: 01/10/2023 8:38:28 AM By: Allen Derry PA-C Entered By: Allen Derry on 01/10/2023 01:02:72 -------------------------------------------------------------------------------- HPI Details Patient Name: Date of Service: [name], [name] 01/10/2023 8:30 A M Medical Record Number: [id number] Patient Account Number: [id number] Date of Birth/Sex: Treating RN: 1950-12-10 (72 y.o. Esmeralda Links Primary Care Provider: Daniel Nones Other Clinician: Betha Loa Referring Provider: Treating Provider/Extender: Lois Huxley in Treatment: 12 History of Present Illness HPI Description: 10-18-2022 patient presents today for initial inspection here in our clinic concerning issues that she has been having with a wound on her right anterior lower extremity. Unfortunately the patient had an issue where she was setting up a table and the table was leg had gotten stuck. When she was trying to dislodge it came loose too quickly and she ended up actually falling and this caused a significant issue with a skin tear on the right shin among other things including hitting her head and hurting her neck as well. Fortunately I do not see any signs overall of infection right now but there is definitely some need for sharp debridement in regard to the necrotic tissue at this point on the right anterior  shin. 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 claim. Patient has an elevated blood pressure reading today but no diagnosis of hypertension she has whitecoat syndrome otherwise she is a fairly healthy individual as far as wound healing is concerned. 6/19; patient was admitted to our clinic last week on 6/14 for traumatic leg wound. Upon having her dressing changed today it was noted she was having more serous drainage from the Opening in the center Of the wound and patient was concerned for infection and came in to be evaluated. Currently she denies fever/chills, increased warmth or erythema to the surrounding skin or Purulent drainage. She has been using collagen and Xeroform to the wound beds. 10-25-2022 upon evaluation today patient's wound actually appears to be doing much better at this time. Fortunately I do not see any signs of active infection locally or systemically which is good news that she actually seems to be better than apparently she was on Wednesday. Nonetheless she still has the center area that keeps collecting fluid and sealing up and then subsequently draining I think if we keep her swelling under control this will alleviate a lot of this issue in general. I discussed that with her today. SATONYA, JESSIE ([id number]) [id number] Page 2 of 7 11-01-2022 upon evaluation today patient appears to be doing well currently in regard to her wound. She has been tolerating the dressing changes without complication. Fortunately there does not appear to be any signs of active infection locally nor systemically which is great news. I am actually extremely pleased with where things stand at this point. 11-08-2022 upon evaluation today patient appears to be doing well currently in regard to her wound. She has been tolerating the dressing changes without complication. Fortunately there does not appear to be any signs of active infection locally  nor  systemically at this time which is great news. No fevers, chills, nausea, vomiting, or diarrhea. 11-15-2022 upon evaluation today patient's wound is actually showing signs of excellent improvement. There is just a very small area that is remaining open at this time and in general I think that we are making good headway towards closure. I do not see any signs of active infection locally or systemically which is great news. 7/26; traumatic wound on the right anterior mid tibia with. We changed to endoform last week patient is changing the dressing herself. This was initially traumatic with an accident at work 12-06-2022 upon evaluation today patient appears to be doing well currently in regard to her wound. She is actually showing signs of excellent improvement which is great news and in general I do believe that she is making great progress towards complete closure. 12-13-2022 upon evaluation today patient appears to be doing well currently in regard to her wounds this actually looks much better with the surgical wound compared to last week when it was so dry. This is healing quite nicely. 12-20-2022 upon evaluation today patient appears to be doing well currently in regard to her wound I think we can probably switch to the collagen at this point that is Prisma due to the fact that she is not having any real depth to the wound at this time. She is in agreement with plan. 12-27-2022 upon evaluation today patient appears to be doing excellent in regard to her wound. She is tolerating the dressing changes without complication. Fortunately I do not see any signs of active infection at this time which is great news she is very close to complete resolution. 01-10-2023 upon evaluation today patient appears to be doing well currently in regard to her wound which is actually filling in quite nicely. I am actually very pleased with where we stand I do believe that the patient is making great headway towards complete  closure. In fact I think she is pretty much about at surface level and hopeful the new skin will be able to grow over fairly rapidly going forward at this point. Electronic Signature(s) Signed: 01/10/2023 10:40:13 AM By: Allen Derry PA-C Entered By: Allen Derry on 01/10/2023 07:40:13 -------------------------------------------------------------------------------- Physical Exam Details Patient Name: Date of Service: [name], [name] 01/10/2023 8:30 A M Medical Record Number: [id number] Patient Account Number: [id number] Date of Birth/Sex: Treating RN: 1950-12-23 (72 y.o. Esmeralda Links Primary Care Provider: Daniel Nones Other Clinician: Betha Loa Referring Provider: Treating Provider/Extender: [name] in Treatment: 12 Constitutional Well-nourished and well-hydrated in no acute distress. Respiratory normal breathing without difficulty. Psychiatric this patient is able to make decisions and demonstrates good insight into disease process. Alert and Oriented x 3. pleasant and cooperative. Notes Upon inspection patient's wound bed actually showed signs of good granulation epithelization at this point. Fortunately I do not see any evidence of worsening overall and I do believe that the patient is making excellent headway towards complete closure which is great news. I am extremely happy with where we stand currently. Electronic Signature(s) Signed: 01/10/2023 10:41:03 AM By: Allen Derry PA-C Entered By: Allen Derry on 01/10/2023 07:41:03 [place] ([id number]) [id number] Page 3 of 7 -------------------------------------------------------------------------------- Physician Orders Details Patient Name: Date of Service: [name], [place] [name] 01/10/2023 8:30 A M Medical Record Number: [id number] Patient Account Number: [id number] Date of Birth/Sex: Treating RN: 1950-12-04 (72 y.o. Esmeralda Links Primary Care Provider: Daniel Nones Other Clinician: Glynda Jaeger,  Angie Referring Provider: Treating Provider/Extender: Lois Huxley in Treatment: 12 Verbal / Phone Orders: Yes Clinician: Angelina Pih Read Back and Verified: Yes Diagnosis Coding ICD-10 Coding Code Description [phone] Laceration without foreign body, right lower leg, initial encounter L97.812 Non-pressure chronic ulcer of other part of right lower leg with fat layer exposed R03.0 Elevated blood-pressure reading, without diagnosis of hypertension Follow-up Appointments Return Appointment in 1 week. Nurse Visit as needed Bathing/ Shower/ Hygiene May shower; gently cleanse wound with antibacterial soap, rinse and pat dry prior to dressing wounds Anesthetic (Use 'Patient Medications' Section for Anesthetic Order Entry) Lidocaine applied to wound bed Additional Orders / Instructions Follow Nutritious Diet and Increase Protein Intake Wound Treatment Wound #1 - Lower Leg Wound Laterality: Right, Midline Cleanser: Vashe 5.8 (oz) 1 x Per Day/30 Days Discharge Instructions: Use vashe 5.8 (oz) as directed Peri-Wound Care: AandD Ointment 1 x Per Day/30 Days Discharge Instructions: apply tiny dot over prisma Prim Dressing: Prisma 4.34 (in) 1 x Per Day/30 Days ary Discharge Instructions: Moisten w/normal saline or sterile water; Cover wound as directed. Do not remove from wound bed. Secondary Dressing: (BORDER) Zetuvit Plus SILICONE BORDER Dressing 4x4 (in/in) 1 x Per Day/30 Days Discharge Instructions: Please do not put silicone bordered dressings under wraps. Use non-bordered dressing only. Electronic Signature(s) Signed: 01/10/2023 1:22:38 PM By: Betha Loa Signed: 01/10/2023 2:02:35 PM By: Allen Derry PA-C Entered By: Betha Loa on 01/10/2023 06:07:49 [place] ([id number]) [id number] Page 4 of 7 -------------------------------------------------------------------------------- Problem List  Details Patient Name: Date of Service: [name], [place] [name] 01/10/2023 8:30 A M Medical Record Number: [id number] Patient Account Number: [id number] Date of Birth/Sex: Treating RN: [birth year] (72 y.o. Esmeralda Links Primary Care Provider: Daniel Nones Other Clinician: Betha Loa Referring Provider: Treating Provider/Extender: Lois Huxley in Treatment: 12 Active Problems ICD-10 Encounter Code Description Active Date MDM Diagnosis S81.811A Laceration without foreign body, right lower leg, initial encounter 10/18/2022 No Yes L97.812 Non-pressure chronic ulcer of other part of right lower leg with fat layer 10/18/2022 No Yes exposed R03.0 Elevated blood-pressure reading, without diagnosis of hypertension 10/18/2022 No Yes Inactive Problems Resolved Problems Electronic Signature(s) Signed: 01/10/2023 8:38:24 AM By: Allen Derry PA-C Entered By: Allen Derry on 01/10/2023 05:38:24 -------------------------------------------------------------------------------- Progress Note Details Patient Name: Date of Service: [name], [name] 01/10/2023 8:30 A M Medical Record Number: [id number] Patient Account Number: [id number] Date of Birth/Sex: Treating RN: 1950/08/28 (72 y.o. Esmeralda Links Primary Care Provider: Daniel Nones Other Clinician: Betha Loa Referring Provider: Treating Provider/Extender: Lois Huxley in Treatment: [address] Subjective Chief Complaint ANAIZA, AZBILL ([id number]) [id number] Page 5 of 7 Information obtained from Patient Right LE ulcer History of Present Illness (HPI) 10-18-2022 patient presents today for initial inspection here in our clinic concerning issues that she has been having with a wound on her right anterior lower extremity. Unfortunately the patient had an issue where she was setting up a table and the table was leg had gotten stuck. When she was trying to dislodge it came loose too quickly  and she ended up actually falling and this caused a significant issue with a skin tear on the right shin among other things including hitting her head and hurting her neck as well. Fortunately I do not see any signs overall of infection right now but there is definitely some need for sharp debridement in regard to the necrotic tissue at this point on the right anterior shin. This injury  occurred on 10-08-2022 and this is a Teacher, adult education. claim. Patient has an elevated blood pressure reading today but no diagnosis of hypertension she has whitecoat syndrome otherwise she is a fairly healthy individual as far as wound healing is concerned. 6/19; patient was admitted to our clinic last week on 6/14 for traumatic leg wound. Upon having her dressing changed today it was noted she was having more serous drainage from the Opening in the center Of the wound and patient was concerned for infection and came in to be evaluated. Currently she denies fever/chills, increased warmth or erythema to the surrounding skin or Purulent drainage. She has been using collagen and Xeroform to the wound beds. 10-25-2022 upon evaluation today patient's wound actually appears to be doing much better at this time. Fortunately I do not see any signs of active infection locally or systemically which is good news that she actually seems to be better than apparently she was on Wednesday. Nonetheless she still has the center area that keeps collecting fluid and sealing up and then subsequently draining I think if we keep her swelling under control this will alleviate a lot of this issue in general. I discussed that with her today. 11-01-2022 upon evaluation today patient appears to be doing well currently in regard to her wound. She has been tolerating the dressing changes without complication. Fortunately there does not appear to be any signs of active infection locally nor systemically which is great news. I am actually extremely pleased  with where things stand at this point. 11-08-2022 upon evaluation today patient appears to be doing well currently in regard to her wound. She has been tolerating the dressing changes without complication. Fortunately there does not appear to be any signs of active infection locally nor systemically at this time which is great news. No fevers, chills, nausea, vomiting, or diarrhea. 11-15-2022 upon evaluation today patient's wound is actually showing signs of excellent improvement. There is just a very small area that is remaining open at this time and in general I think that we are making good headway towards closure. I do not see any signs of active infection locally or systemically which is great news. 7/26; traumatic wound on the right anterior mid tibia with. We changed to endoform last week patient is changing the dressing herself. This was initially traumatic with an accident at work 12-06-2022 upon evaluation today patient appears to be doing well currently in regard to her wound. She is actually showing signs of excellent improvement which is great news and in general I do believe that she is making great progress towards complete closure. 12-13-2022 upon evaluation today patient appears to be doing well currently in regard to her wounds this actually looks much better with the surgical wound compared to last week when it was so dry. This is healing quite nicely. 12-20-2022 upon evaluation today patient appears to be doing well currently in regard to her wound I think we can probably switch to the collagen at this point that is Prisma due to the fact that she is not having any real depth to the wound at this time. She is in agreement with plan. 12-27-2022 upon evaluation today patient appears to be doing excellent in regard to her wound. She is tolerating the dressing changes without complication. Fortunately I do not see any signs of active infection at this time which is great news she is very close  to complete resolution. 01-10-2023 upon evaluation today patient appears to be doing well  currently in regard to her wound which is actually filling in quite nicely. I am actually very pleased with where we stand I do believe that the patient is making great headway towards complete closure. In fact I think she is pretty much about at surface level and hopeful the new skin will be able to grow over fairly rapidly going forward at this point. Objective Constitutional Well-nourished and well-hydrated in no acute distress. Vitals Time Taken: 8:39 AM, Weight: 128 lbs, Temperature: 98.1 F, Pulse: 80 bpm, Respiratory Rate: 18 breaths/min, Blood Pressure: 162/96 mmHg. Respiratory normal breathing without difficulty. Psychiatric this patient is able to make decisions and demonstrates good insight into disease process. Alert and Oriented x 3. pleasant and cooperative. General Notes: Upon inspection patient's wound bed actually showed signs of good granulation epithelization at this point. Fortunately I do not see any evidence of worsening overall and I do believe that the patient is making excellent headway towards complete closure which is great news. I am extremely happy with where we stand currently. Integumentary (Hair, Skin) Wound #1 status is Open. Original cause of wound was Trauma. The date acquired was: 10/08/2022. The wound has been in treatment 12 weeks. The wound is located on the Right,Midline Lower Leg. The wound measures 0.1cm length x 0.1cm width x 0.1cm depth; 0.008cm^2 area and 0.001cm^3 volume. There is a medium amount of serosanguineous drainage noted. Assessment JINORA, PERON ([id number]) [id number] Page 6 of 7 Active Problems ICD-10 Laceration without foreign body, right lower leg, initial encounter Non-pressure chronic ulcer of other part of right lower leg with fat layer exposed Elevated blood-pressure reading, without diagnosis of  hypertension Plan Follow-up Appointments: Return Appointment in 1 week. Nurse Visit as needed Bathing/ Shower/ Hygiene: May shower; gently cleanse wound with antibacterial soap, rinse and pat dry prior to dressing wounds Anesthetic (Use 'Patient Medications' Section for Anesthetic Order Entry): Lidocaine applied to wound bed Additional Orders / Instructions: Follow Nutritious Diet and Increase Protein Intake WOUND #1: - Lower Leg Wound Laterality: Right, Midline Cleanser: Vashe 5.8 (oz) 1 x Per Day/30 Days Discharge Instructions: Use vashe 5.8 (oz) as directed Peri-Wound Care: AandD Ointment 1 x Per Day/30 Days Discharge Instructions: apply tiny dot over prisma Prim Dressing: Prisma 4.34 (in) 1 x Per Day/30 Days ary Discharge Instructions: Moisten w/normal saline or sterile water; Cover wound as directed. Do not remove from wound bed. Secondary Dressing: (BORDER) Zetuvit Plus SILICONE BORDER Dressing 4x4 (in/in) 1 x Per Day/30 Days Discharge Instructions: Please do not put silicone bordered dressings under wraps. Use non-bordered dressing only. 1. I am going to recommend that we have the patient continue to monitor for any signs of infection or worsening. Based on what I am seeing I do believe that she is making really good headway towards closure and I expect this to be fully closed in the next couple of weeks or so. 2. I am going to recommend as well that she should continue with the Prisma followed by the bordered foam dressing we are using AandD ointment as well to keep this from drying out. We will see patient back for reevaluation in 1 week here in the clinic. If anything worsens or changes patient will contact our office for additional recommendations. Electronic Signature(s) Signed: 01/10/2023 10:41:25 AM By: Allen Derry PA-C Entered By: Allen Derry on 01/10/2023 07:41:25 -------------------------------------------------------------------------------- SuperBill Details Patient  Name: Date of Service: [name], [name] 01/10/2023 Medical Record Number: [id number] Patient Account Number: [id number] Date of Birth/Sex: Treating  RN: 08-25-1950 (72 y.o. Esmeralda Links Primary Care Provider: Daniel Nones Other Clinician: Betha Loa Referring Provider: Treating Provider/Extender: Lois Huxley in Treatment: 12 Diagnosis Coding ICD-10 Codes Code Description [phone] Laceration without foreign body, right lower leg, initial encounter L97.812 Non-pressure chronic ulcer of other part of right lower leg with fat layer exposed R03.0 Elevated blood-pressure reading, without diagnosis of hypertension CAROLENA, LOWNES ([id number]) [id number] Page 7 of 7 Facility Procedures : CPT4 Code: [id number] Description: [phone] - WOUND CARE VISIT-LEV 2 EST PT Modifier: Quantity: 1 Physician Procedures : CPT4 Code Description Modifier [id number] - WC PHYS LEVEL 3 - EST PT ICD-10 Diagnosis Description S81.811A Laceration without foreign body, right lower leg, initial encounter L97.812 Non-pressure chronic ulcer of other part of right lower leg with  fat layer exposed R03.0 Elevated blood-pressure reading, without diagnosis of hypertension Quantity: 1 Electronic Signature(s) Signed: 01/10/2023 10:43:19 AM By: Allen Derry PA-C Entered By: Allen Derry on 01/10/2023 07:43:18

## 2023-01-10 NOTE — Progress Notes (Addendum)
PALLAVI, KUZIO ([id number]) [id number] Page 1 of 9 Visit Report for 01/10/2023 Arrival Information Details Patient Name: Date of Service: [name], Meredith Dakota [name] 01/10/2023 8:30 A M Medical Record Number: [id number] Patient Account Number: [id number] Date of Birth/Sex: Treating RN: Oct 29, 1950 (71 y.o. Esmeralda Links Primary Care Kei Langhorst: Daniel Nones Other Clinician: Betha Loa Referring Lilliana Turner: Treating Cashtyn Pouliot/Extender: Lois Huxley in Treatment: 12 Visit Information History Since Last Visit All ordered tests and consults were completed: No Patient Arrived: Ambulatory Added or deleted any medications: No Arrival Time: 08:35 Any new allergies or adverse reactions: No Transfer Assistance: None Had a fall or experienced change in No Patient Identification Verified: Yes activities of daily living that may affect Secondary Verification Process Completed: Yes risk of falls: Patient Requires Transmission-Based Precautions: No Signs or symptoms of abuse/neglect since last visito No Patient Has Alerts: Yes Hospitalized since last visit: No Patient Alerts: ABI 11/01/22 AVVS Implantable device outside of the clinic excluding No R: 1.13 L:1.25 cellular tissue based products placed in the center TBI R 0.64 L 0.92 since last visit: Has Dressing in Place as Prescribed: Yes Pain Present Now: No Electronic Signature(s) Signed: 01/10/2023 1:22:38 PM By: Betha Loa Entered By: Betha Loa on 01/10/2023 05:38:59 -------------------------------------------------------------------------------- Clinic Level of Care Assessment Details Patient Name: Date of Service: Meredith Neas [name] 01/10/2023 8:30 A M Medical Record Number: [id number] Patient Account Number: [id number] Date of Birth/Sex: Treating RN: 12/13/1950 (72 y.o. Esmeralda Links Primary Care Nikolaus Pienta: Daniel Nones Other Clinician: Betha Loa Referring Karson Reede: Treating  Mallerie Blok/Extender: Lois Huxley in Treatment: 12 Clinic Level of Care Assessment Items TOOL 4 Quantity Score []  - 0 Use when only an EandM is performed on FOLLOW-UP visit ASSESSMENTS - Nursing Assessment / Reassessment X- 1 10 Reassessment of Co-morbidities (includes updates in patient status) X- 1 5 Reassessment of Adherence to Treatment Plan [name], [name] ([id number]) [phone] Page 2 of 9 ASSESSMENTS - Wound and Skin A ssessment / Reassessment X - Simple Wound Assessment / Reassessment - one wound 1 5 []  - 0 Complex Wound Assessment / Reassessment - multiple wounds []  - 0 Dermatologic / Skin Assessment (not related to wound area) ASSESSMENTS - Focused Assessment []  - 0 Circumferential Edema Measurements - multi extremities []  - 0 Nutritional Assessment / Counseling / Intervention []  - 0 Lower Extremity Assessment (monofilament, tuning fork, pulses) []  - 0 Peripheral Arterial Disease Assessment (using hand held doppler) ASSESSMENTS - Ostomy and/or Continence Assessment and Care []  - 0 Incontinence Assessment and Management []  - 0 Ostomy Care Assessment and Management (repouching, etc.) PROCESS - Coordination of Care X - Simple Patient / Family Education for ongoing care 1 15 []  - 0 Complex (extensive) Patient / Family Education for ongoing care []  - 0 Staff obtains Chiropractor, Records, T Results / Process Orders est []  - 0 Staff telephones HHA, Nursing Homes / Clarify orders / etc []  - 0 Routine Transfer to another Facility (non-emergent condition) []  - 0 Routine Hospital Admission (non-emergent condition) []  - 0 New Admissions / Manufacturing engineer / Ordering NPWT Apligraf, etc. , []  - 0 Emergency Hospital Admission (emergent condition) X- 1 10 Simple Discharge Coordination []  - 0 Complex (extensive) Discharge Coordination PROCESS - Special Needs []  - 0 Pediatric / Minor Patient Management []  - 0 Isolation  Patient Management []  - 0 Hearing / Language / Visual special needs []  - 0 Assessment of Community assistance (transportation, D/C planning, etc.) []  - 0 Additional assistance /  Altered mentation []  - 0 Support Surface(s) Assessment (bed, cushion, seat, etc.) INTERVENTIONS - Wound Cleansing / Measurement X - Simple Wound Cleansing - one wound 1 5 []  - 0 Complex Wound Cleansing - multiple wounds X- 1 5 Wound Imaging (photographs - any number of wounds) []  - 0 Wound Tracing (instead of photographs) X- 1 5 Simple Wound Measurement - one wound []  - 0 Complex Wound Measurement - multiple wounds INTERVENTIONS - Wound Dressings X - Small Wound Dressing one or multiple wounds 1 10 []  - 0 Medium Wound Dressing one or multiple wounds []  - 0 Large Wound Dressing one or multiple wounds []  - 0 Application of Medications - topical []  - 0 Application of Medications - injection INTERVENTIONS - Miscellaneous []  - 0 External ear exam [name], [name] ([id number]) [id number] Page 3 of 9 []  - 0 Specimen Collection (cultures, biopsies, blood, body fluids, etc.) []  - 0 Specimen(s) / Culture(s) sent or taken to Lab for analysis []  - 0 Patient Transfer (multiple staff / Michiel Sites Lift / Similar devices) []  - 0 Simple Staple / Suture removal (25 or less) []  - 0 Complex Staple / Suture removal (26 or more) []  - 0 Hypo / Hyperglycemic Management (close monitor of Blood Glucose) []  - 0 Ankle / Brachial Index (ABI) - do not check if billed separately X- 1 5 Vital Signs Has the patient been seen at the hospital within the last three years: Yes Total Score: 75 Level Of Care: New/Established - Level 2 Electronic Signature(s) Signed: 01/10/2023 1:22:38 PM By: Betha Loa Entered By: Betha Loa on 01/10/2023 06:08:15 -------------------------------------------------------------------------------- Encounter Discharge Information Details Patient Name: Date of  Service: [name], [name] 01/10/2023 8:30 A M Medical Record Number: [id number] Patient Account Number: [id number] Date of Birth/Sex: Treating RN: 03/23/1951 (71 y.o. Esmeralda Links Primary Care Millie Forde: Daniel Nones Other Clinician: Betha Loa Referring Maleko Greulich: Treating Mercede Rollo/Extender: Lois Huxley in Treatment: 12 Encounter Discharge Information Items Discharge Condition: Stable Ambulatory Status: Ambulatory Discharge Destination: Home Transportation: Private Auto Accompanied By: self Schedule Follow-up Appointment: Yes Clinical Summary of Care: Electronic Signature(s) Signed: 01/10/2023 1:22:38 PM By: Betha Loa Entered By: Betha Loa on 01/10/2023 06:15:19 Lower Extremity Assessment Details -------------------------------------------------------------------------------- [place] Prince ([id number]) [id number] Page 4 of 9 Patient Name: Date of Service: [name], Meredith Dakota [name] 01/10/2023 8:30 A M Medical Record Number: [id number] Patient Account Number: [id number] Date of Birth/Sex: Treating RN: [birth year] (72 y.o. Esmeralda Links Primary Care Sabir Charters: Daniel Nones Other Clinician: Betha Loa Referring Travers Goodley: Treating Prerana Strayer/Extender: [name] in Treatment: 12 Edema Assessment Left: Right: Assessed: No Yes Edema: No Calf Left: Right: Point of Measurement: 34 cm From Medial Instep 33 cm Ankle Left: Right: Point of Measurement: 12 cm From Medial Instep 19.5 cm Vascular Assessment Left: Right: Pulses: Dorsalis Pedis Palpable: Yes Electronic Signature(s) Signed: 01/10/2023 1:22:38 PM By: Betha Loa Signed: 01/10/2023 1:24:25 PM By: Angelina Pih Entered By: Betha Loa on 01/10/2023 05:46:36 -------------------------------------------------------------------------------- Multi Wound Chart Details Patient Name: Date of Service: [name], [name] 01/10/2023 8:30 A M Medical  Record Number: [id number] Patient Account Number: [id number] Date of Birth/Sex: Treating RN: [birth year] (72 y.o. Esmeralda Links Primary Care Param Capri: Daniel Nones Other Clinician: Betha Loa Referring Laneya Gasaway: Treating Catalea Labrecque/Extender: Lois Huxley in Treatment: 12 Vital Signs Height(in): Pulse(bpm): 80 Weight(lbs): 128 Blood Pressure(mmHg): 162/96 Body Mass Index(BMI): Temperature(F): 98.1 Respiratory Rate(breaths/min): 18 [1:Photos:] [N/A:N/A] Right, Midline Lower Leg N/A N/A Wound Location:  [name], [name] ([id number]) [id number] Page 5 of 9 Trauma N/A N/A Wounding Event: Trauma, Other N/A N/A Primary Etiology: Chronic sinus problems/congestion N/A N/A Comorbid History: 10/08/2022 N/A N/A Date Acquired: 12 N/A N/A Weeks of Treatment: Open N/A N/A Wound Status: No N/A N/A Wound Recurrence: Yes N/A N/A Clustered Wound: 0.1x0.1x0.1 N/A N/A Measurements L x W x D (cm) 0.008 N/A N/A A (cm) : rea 0.001 N/A N/A Volume (cm) : 100.00% N/A N/A % Reduction in A rea: 100.00% N/A N/A % Reduction in Volume: Full Thickness With Exposed Support N/A N/A Classification: Structures Medium N/A N/A Exudate Amount: Serosanguineous N/A N/A Exudate Type: red, brown N/A N/A Exudate Color: Treatment Notes Electronic Signature(s) Signed: 01/10/2023 1:22:38 PM By: Betha Loa Entered By: Betha Loa on 01/10/2023 05:46:44 -------------------------------------------------------------------------------- Multi-Disciplinary Care Plan Details Patient Name: Date of Service: [name], [name] 01/10/2023 8:30 A M Medical Record Number: [id number] Patient Account Number: [id number] Date of Birth/Sex: Treating RN: 06-30-1950 (72 y.o. Esmeralda Links Primary Care Aydeen Blume: Daniel Nones Other Clinician: Betha Loa Referring Nicholle Falzon: Treating Kylian Loh/Extender: Lois Huxley in Treatment: 12 Active  Inactive Wound/Skin Impairment Nursing Diagnoses: Impaired tissue integrity Goals: Patient/caregiver will verbalize understanding of skin care regimen Date Initiated: 10/18/2022 Date Inactivated: 11/01/2022 Target Resolution Date: 10/18/2022 Goal Status: Met Ulcer/skin breakdown will have a volume reduction of 30% by week 4 Date Initiated: 10/18/2022 Date Inactivated: 11/25/2022 Target Resolution Date: 11/08/2022 Goal Status: Met Ulcer/skin breakdown will have a volume reduction of 50% by week 8 Date Initiated: 10/18/2022 Date Inactivated: 11/29/2022 Target Resolution Date: 12/06/2022 Goal Status: Met Ulcer/skin breakdown will have a volume reduction of 80% by week 12 Date Initiated: 10/18/2022 Target Resolution Date: 01/03/2023 Goal Status: Active Ulcer/skin breakdown will heal within 14 weeks Date Initiated: 10/18/2022 Target Resolution Date: 01/16/2023 Goal Status: Active Interventions: Assess patient/caregiver ability to obtain necessary supplies [name], [name] ([id number]) [id number] Page 6 of 9 Assess patient/caregiver ability to perform ulcer/skin care regimen upon admission and as needed Assess ulceration(s) every visit Provide education on ulcer and skin care Treatment Activities: Referred to DME Ally Knodel for dressing supplies : 10/18/2022 Skin care regimen initiated : 10/18/2022 Notes: Electronic Signature(s) Signed: 01/10/2023 1:22:38 PM By: Betha Loa Signed: 01/10/2023 1:24:25 PM By: Angelina Pih Entered By: Betha Loa on 01/10/2023 06:08:24 -------------------------------------------------------------------------------- Pain Assessment Details Patient Name: Date of Service: [name], [name] 01/10/2023 8:30 A M Medical Record Number: [id number] Patient Account Number: [id number] Date of Birth/Sex: Treating RN: October 19, 1950 (72 y.o. Esmeralda Links Primary Care Toribio Seiber: Daniel Nones Other Clinician: Betha Loa Referring  Calyn Sivils: Treating Charliee Krenz/Extender: Lois Huxley in Treatment: 12 Active Problems Location of Pain Severity and Description of Pain Patient Has Paino No Site Locations Pain Management and Medication Current Pain Management: Electronic Signature(s) Signed: 01/10/2023 1:22:38 PM By: Betha Loa Signed: 01/10/2023 1:24:25 PM By: Angelina Pih Entered By: Betha Loa on 01/10/2023 05:42:06 [name] ([id number]) [id number] Page 7 of 9 -------------------------------------------------------------------------------- Patient/Caregiver Education Details Patient Name: Date of Service: [name], Meredith Dakota [place] 9/6/2024andnbsp8:30 A M Medical Record Number: [id number] Patient Account Number: [id number] Date of Birth/Gender: Treating RN: [birth year] (72 y.o. Esmeralda Links Primary Care Physician: Daniel Nones Other Clinician: Betha Loa Referring Physician: Treating Physician/Extender: Lois Huxley in Treatment: 12 Education Assessment Education Provided To: Patient Education Topics Provided Wound/Skin Impairment: Handouts: Other: continue wound care as directed Methods: Explain/Verbal Responses: State content correctly Electronic Signature(s) Signed: 01/10/2023 1:22:38 PM By: Betha Loa  Entered By: Betha Loa on 01/10/2023 06:08:45 -------------------------------------------------------------------------------- Wound Assessment Details Patient Name: Date of Service: Meredith Neas [name] 01/10/2023 8:30 A M Medical Record Number: [id number] Patient Account Number: [id number] Date of Birth/Sex: Treating RN: Sep 02, 1950 (72 y.o. Esmeralda Links Primary Care Kinzly Pierrelouis: Daniel Nones Other Clinician: Betha Loa Referring Oluwatoyin Banales: Treating [name]/Extender: [name] in Treatment: 12 Wound Status Wound Number: 1 Primary Etiology: Trauma, Other Wound Location: Right, Midline Lower  Leg Wound Status: Open Wounding Event: Trauma Comorbid History: Chronic sinus problems/congestion Date Acquired: 10/08/2022 Weeks Of Treatment: 12 Clustered Wound: Yes Photos [name], [name] ([id number]) [id number] Page 8 of 9 Wound Measurements Length: (cm) 0.1 Width: (cm) 0.1 Depth: (cm) 0.1 Area: (cm) 0.008 Volume: (cm) 0.001 % Reduction in Area: 100% % Reduction in Volume: 100% Wound Description Classification: Full Thickness With Exposed Support Exudate Amount: Medium Exudate Type: Serosanguineous Exudate Color: red, brown Structures Treatment Notes Wound #1 (Lower Leg) Wound Laterality: Right, Midline Cleanser Vashe 5.8 (oz) Discharge Instruction: Use vashe 5.8 (oz) as directed Peri-Wound Care AandD Ointment Discharge Instruction: apply tiny dot over prisma Topical Primary Dressing Prisma 4.34 (in) Discharge Instruction: Moisten w/normal saline or sterile water; Cover wound as directed. Do not remove from wound bed. Secondary Dressing (BORDER) Zetuvit Plus SILICONE BORDER Dressing 4x4 (in/in) Discharge Instruction: Please do not put silicone bordered dressings under wraps. Use non-bordered dressing only. Secured With Compression Wrap Compression Stockings Add-Ons Electronic Signature(s) Signed: 01/10/2023 1:22:38 PM By: Betha Loa Signed: 01/10/2023 1:24:25 PM By: Angelina Pih Entered By: Betha Loa on 01/10/2023 05:45:46 [name] ([id number]) [id number] Page 9 of 9 -------------------------------------------------------------------------------- Vitals Details Patient Name: Date of Service: [name], Meredith Dakota [name] 01/10/2023 8:30 A M Medical Record Number: [id number] Patient Account Number: [id number] Date of Birth/Sex: Treating RN: 08-Aug-1950 (72 y.o. Esmeralda Links Primary Care Erdem Naas: Daniel Nones Other Clinician: Betha Loa Referring Pasha Broad: Treating Azra Abrell/Extender: Lois Huxley in Treatment: 12 Vital Signs Time Taken: 08:39 Temperature (F): 98.1 Weight (lbs): 128 Pulse (bpm): 80 Respiratory Rate (breaths/min): 18 Blood Pressure (mmHg): 162/96 Reference Range: 80 - 120 mg / dl Electronic Signature(s) Signed: 01/10/2023 1:22:38 PM By: Betha Loa Entered By: Betha Loa on 01/10/2023 05:42:00

## 2023-01-15 ENCOUNTER — Encounter: Payer: Medicare HMO | Admitting: Physical Therapy

## 2023-01-30 ENCOUNTER — Encounter: Payer: Medicare HMO | Admitting: Physical Therapy

## 2023-01-30 ENCOUNTER — Ambulatory Visit: Payer: PRIVATE HEALTH INSURANCE

## 2023-01-31 ENCOUNTER — Encounter: Payer: PRIVATE HEALTH INSURANCE | Admitting: Physician Assistant

## 2023-01-31 DIAGNOSIS — S81811A Laceration without foreign body, right lower leg, initial encounter: Secondary | ICD-10-CM | POA: Diagnosis not present

## 2023-01-31 NOTE — Progress Notes (Signed)
KIRBY, ARGUETA ([id number]) [id number] Page 1 of 8 Visit Report for 01/31/2023 Arrival Information Details Patient Name: Date of Service: [name], [place] [name] 01/31/2023 8:30 A M Medical Record Number: [id number] Patient Account Number: [id number] Date of Birth/Sex: Treating RN: 1950-07-07 (72 y.o. Esmeralda Links Primary Care Brentlee Sciara: Daniel Nones Other Clinician: Referring Berl Bonfanti: Treating Tinamarie Przybylski/Extender: Lois Huxley in Treatment: 15 Visit Information History Since Last Visit Added or deleted any medications: No Patient Arrived: Ambulatory Any new allergies or adverse reactions: No Arrival Time: 08:47 Had a fall or experienced change in No Accompanied By: self activities of daily living that may affect Transfer Assistance: None risk of falls: Patient Identification Verified: Yes Hospitalized since last visit: No Secondary Verification Process Completed: Yes Has Dressing in Place as Prescribed: Yes Patient Requires Transmission-Based Precautions: No Pain Present Now: No Patient Has Alerts: Yes Patient Alerts: ABI 11/01/22 AVVS R: 1.13 L:1.25 TBI R 0.64 L 0.92 Electronic Signature(s) Signed: 01/31/2023 12:39:02 PM By: Angelina Pih Entered By: Angelina Pih on 01/31/2023 05:47:20 -------------------------------------------------------------------------------- Clinic Level of Care Assessment Details Patient Name: Date of Service: [name] [place] J. 01/31/2023 8:30 A M Medical Record Number: [id number] Patient Account Number: [id number] Date of Birth/Sex: Treating RN: [birth year] (72 y.o. Esmeralda Links Primary Care Trung Wenzl: Daniel Nones Other Clinician: Referring Fritz Cauthon: Treating Monish Haliburton/Extender: Lois Huxley in Treatment: 15 Clinic Level of Care Assessment Items TOOL 4 Quantity Score []  - 0 Use when only an EandM is performed on FOLLOW-UP visit ASSESSMENTS - Nursing Assessment / Reassessment X-  1 10 Reassessment of Co-morbidities (includes updates in patient status) X- 1 5 Reassessment of Adherence to Treatment Plan ASSESSMENTS - Wound and Skin A ssessment / Reassessment X - Simple Wound Assessment / Reassessment - one wound 1 5 Meredith, Prince ([id number]) [id number] Page 2 of 8 []  - 0 Complex Wound Assessment / Reassessment - multiple wounds []  - 0 Dermatologic / Skin Assessment (not related to wound area) ASSESSMENTS - Focused Assessment []  - 0 Circumferential Edema Measurements - multi extremities []  - 0 Nutritional Assessment / Counseling / Intervention []  - 0 Lower Extremity Assessment (monofilament, tuning fork, pulses) []  - 0 Peripheral Arterial Disease Assessment (using hand held doppler) ASSESSMENTS - Ostomy and/or Continence Assessment and Care []  - 0 Incontinence Assessment and Management []  - 0 Ostomy Care Assessment and Management (repouching, etc.) PROCESS - Coordination of Care X - Simple Patient / Family Education for ongoing care 1 15 []  - 0 Complex (extensive) Patient / Family Education for ongoing care X- 1 10 Staff obtains Chiropractor, Records, T Results / Process Orders est []  - 0 Staff telephones HHA, Nursing Homes / Clarify orders / etc []  - 0 Routine Transfer to another Facility (non-emergent condition) []  - 0 Routine Hospital Admission (non-emergent condition) []  - 0 New Admissions / Manufacturing engineer / Ordering NPWT Apligraf, etc. , []  - 0 Emergency Hospital Admission (emergent condition) X- 1 10 Simple Discharge Coordination []  - 0 Complex (extensive) Discharge Coordination PROCESS - Special Needs []  - 0 Pediatric / Minor Patient Management []  - 0 Isolation Patient Management []  - 0 Hearing / Language / Visual special needs []  - 0 Assessment of Community assistance (transportation, D/C planning, etc.) []  - 0 Additional assistance / Altered mentation []  - 0 Support Surface(s) Assessment (bed,  cushion, seat, etc.) INTERVENTIONS - Wound Cleansing / Measurement X - Simple Wound Cleansing - one wound 1 5 []  - 0 Complex Wound Cleansing - multiple  wounds X- 1 5 Wound Imaging (photographs - any number of wounds) []  - 0 Wound Tracing (instead of photographs) []  - 0 Simple Wound Measurement - one wound []  - 0 Complex Wound Measurement - multiple wounds INTERVENTIONS - Wound Dressings []  - 0 Small Wound Dressing one or multiple wounds []  - 0 Medium Wound Dressing one or multiple wounds []  - 0 Large Wound Dressing one or multiple wounds []  - 0 Application of Medications - topical []  - 0 Application of Medications - injection INTERVENTIONS - Miscellaneous []  - 0 External ear exam []  - 0 Specimen Collection (cultures, biopsies, blood, body fluids, etc.) Meredith, Prince ([id number]) [phone] Page 3 of 8 []  - 0 Specimen(s) / Culture(s) sent or taken to Lab for analysis []  - 0 Patient Transfer (multiple staff / Michiel Sites Lift / Similar devices) []  - 0 Simple Staple / Suture removal (25 or less) []  - 0 Complex Staple / Suture removal (26 or more) []  - 0 Hypo / Hyperglycemic Management (close monitor of Blood Glucose) []  - 0 Ankle / Brachial Index (ABI) - do not check if billed separately X- 1 5 Vital Signs Has the patient been seen at the hospital within the last three years: Yes Total Score: 70 Level Of Care: New/Established - Level 2 Electronic Signature(s) Signed: 01/31/2023 12:39:02 PM By: Angelina Pih Entered By: Angelina Pih on 01/31/2023 06:18:35 -------------------------------------------------------------------------------- Encounter Discharge Information Details Patient Name: Date of Service: [name], [name] 01/31/2023 8:30 A M Medical Record Number: [id number] Patient Account Number: [id number] Date of Birth/Sex: Treating RN: January 09, 1951 (72 y.o. Esmeralda Links Primary Care Eriko Economos: Daniel Nones Other Clinician: Referring  Deya Bigos: Treating Earle Troiano/Extender: Lois Huxley in Treatment: 15 Encounter Discharge Information Items Discharge Condition: Stable Ambulatory Status: Ambulatory Discharge Destination: Home Transportation: Private Auto Accompanied By: self case manager Schedule Follow-up Appointment: No Clinical Summary of Care: Electronic Signature(s) Signed: 01/31/2023 9:19:41 AM By: Angelina Pih Entered By: Angelina Pih on 01/31/2023 06:19:41 -------------------------------------------------------------------------------- Lower Extremity Assessment Details Patient Name: Date of Service: [name] [name] 01/31/2023 8:30 A M Medical Record Number: [id number] Patient Account Number: [id number] Date of Birth/Sex: Treating RN: [birth year] (72 y.o. Esmeralda Links [place] J ([id number]) [id number] Page 4 of 8 Primary Care Veera Stapleton: Daniel Nones Other Clinician: Referring Shauntelle Jamerson: Treating Francheska Villeda/Extender: Lois Huxley in Treatment: 15 Edema Assessment Assessed: [Left: No] [Right: No] Edema: [Left: N] [Right: o] Calf Left: Right: Point of Measurement: 34 cm From Medial Instep 32.5 cm Ankle Left: Right: Point of Measurement: 12 cm From Medial Instep 18.8 cm Vascular Assessment Pulses: Dorsalis Pedis Palpable: [Right:Yes] Extremity colors, hair growth, and conditions: Extremity Color: [Right:Normal] Hair Growth on Extremity: [Right:Yes] Temperature of Extremity: [Right:Warm < 3 seconds] Toe Nail Assessment Left: Right: Thick: No Discolored: No Deformed: No Improper Length and Hygiene: No Electronic Signature(s) Signed: 01/31/2023 12:39:02 PM By: Angelina Pih Entered By: Angelina Pih on 01/31/2023 05:52:51 -------------------------------------------------------------------------------- Multi Wound Chart Details Patient Name: Date of Service: [name], [name] 01/31/2023 8:30 A M Medical Record Number:  [id number] Patient Account Number: [id number] Date of Birth/Sex: Treating RN: 1950-06-16 (72 y.o. Esmeralda Links Primary Care Allianna Beaubien: Daniel Nones Other Clinician: Referring Harshini Trent: Treating Sorren Vallier/Extender: Lois Huxley in Treatment: 15 Vital Signs Height(in): Pulse(bpm): 88 Weight(lbs): 128 Blood Pressure(mmHg): 147/84 Body Mass Index(BMI): Temperature(F): 97.8 Respiratory Rate(breaths/min): 18 [1:Photos:] [N/A:N/A] Right, Midline Lower Leg N/A N/A Wound Location: Trauma N/A N/A Wounding Event: Trauma, Other N/A N/A Primary  Etiology: Chronic sinus problems/congestion N/A N/A Comorbid History: 10/08/2022 N/A N/A Date Acquired: 15 N/A N/A Weeks of Treatment: Healed - Epithelialized N/A N/A Wound Status: No N/A N/A Wound Recurrence: Yes N/A N/A Clustered Wound: 0x0x0 N/A N/A Measurements L x W x D (cm) 0 N/A N/A A (cm) : rea 0 N/A N/A Volume (cm) : 100.00% N/A N/A % Reduction in A rea: 100.00% N/A N/A % Reduction in Volume: Full Thickness With Exposed Support N/A N/A Classification: Structures None Present N/A N/A Exudate A mount: None Present (0%) N/A N/A Granulation A mount: None Present (0%) N/A N/A Necrotic A mount: Fat Layer (Subcutaneous Tissue): No N/A N/A Exposed Structures: Large (67-100%) N/A N/A Epithelialization: Treatment Notes Electronic Signature(s) Signed: 01/31/2023 9:18:03 AM By: Angelina Pih Entered By: Angelina Pih on 01/31/2023 06:18:03 -------------------------------------------------------------------------------- Multi-Disciplinary Care Plan Details Patient Name: Date of Service: [name], [name] 01/31/2023 8:30 A M Medical Record Number: [id number] Patient Account Number: [id number] Date of Birth/Sex: Treating RN: 05-Mar-1951 (72 y.o. Esmeralda Links Primary Care Jaretzi Droz: Daniel Nones Other Clinician: Referring Aloys Hupfer: Treating Quintell Bonnin/Extender: Lois Huxley in Treatment:  15 Active Inactive Electronic Signature(s) Signed: 01/31/2023 9:18:49 AM By: Angelina Pih Entered By: Angelina Pih on 01/31/2023 06:18:49  [name] ([id number]) [id number] Page 6 of 8 -------------------------------------------------------------------------------- Pain Assessment Details Patient Name: Date of Service: [name], [place] [name] 01/31/2023 8:30 A M Medical Record Number: [id number] Patient Account Number: [id number] Date of Birth/Sex: Treating RN: 1950/08/23 (72 y.o. Esmeralda Links Primary Care Becky Colan: Daniel Nones Other Clinician: Referring Leon Goodnow: Treating Eve Rey/Extender: Lois Huxley in Treatment: 15 Active Problems Location of Pain Severity and Description of Pain Patient Has Paino No Site Locations Rate the pain. Current Pain Level: 0 Pain Management and Medication Current Pain Management: Electronic Signature(s) Signed: 01/31/2023 12:39:02 PM By: Angelina Pih Entered By: Angelina Pih on 01/31/2023 05:49:03 -------------------------------------------------------------------------------- Patient/Caregiver Education Details Patient Name: Date of Service: [name], [place] NNA [place] [place] 9/27/2024andnbsp8:30 A M Medical Record Number: [id number] Patient Account Number: [id number] Date of Birth/Gender: Treating RN: [birth year] (72 y.o. Esmeralda Links Primary Care Physician: Daniel Nones Other Clinician: Referring Physician: Treating Physician/Extender: Lois Huxley in Treatment: [address] J ([id number]) [id number] Page 7 of 8 Education Assessment Education Provided To: Patient Education Topics Provided Wound/Skin Impairment: Handouts: Other: care of healed wound Methods: Explain/Verbal Responses: State content correctly Electronic Signature(s) Signed: 01/31/2023 12:39:02 PM By: Angelina Pih Entered By: Angelina Pih on 01/31/2023  06:19:07 -------------------------------------------------------------------------------- Wound Assessment Details Patient Name: Date of Service: [name], [name] 01/31/2023 8:30 A M Medical Record Number: [id number] Patient Account Number: [id number] Date of Birth/Sex: Treating RN: [birth year] (72 y.o. Esmeralda Links Primary Care Assad Harbeson: Daniel Nones Other Clinician: Referring Dyamond Tolosa: Treating Jnyah Brazee/Extender: [name] in Treatment: 15 Wound Status Wound Number: 1 Primary Etiology: Trauma, Other Wound Location: Right, Midline Lower Leg Wound Status: Healed - Epithelialized Wounding Event: Trauma Comorbid History: Chronic sinus problems/congestion Date Acquired: 10/08/2022 Weeks Of Treatment: 15 Clustered Wound: Yes Photos Wound Measurements Length: (cm) Width: (cm) Depth: (cm) Area: (cm) Volume: (cm) 0 % Reduction in Area: 100% 0 % Reduction in Volume: 100% 0 Epithelialization: Large (67-100%) 0 Tunneling: No 0 Undermining: No Wound Description Classification: Full Thickness With Exposed Support Structures Exudate Amount: None Present Meredith, Prince ([id number]) Foul Odor After Cleansing: No Slough/Fibrino No [phone].pdf Page 8 of 8 Wound Bed Granulation Amount: None Present (0%) Exposed Structure Necrotic Amount: None Present (  0%) Fat Layer (Subcutaneous Tissue) Exposed: No Treatment Notes Wound #1 (Lower Leg) Wound Laterality: Right, Midline Cleanser Peri-Wound Care Topical Primary Dressing Secondary Dressing Secured With Compression Wrap Compression Stockings Add-Ons Electronic Signature(s) Signed: 01/31/2023 12:39:02 PM By: Angelina Pih Entered By: Angelina Pih on 01/31/2023 06:05:08 -------------------------------------------------------------------------------- Vitals Details Patient Name: Date of Service: [name], [name] 01/31/2023 8:30 A M Medical Record Number: [id number] Patient  Account Number: [id number] Date of Birth/Sex: Treating RN: 1951/04/19 (72 y.o. Esmeralda Links Primary Care Acel Natzke: Daniel Nones Other Clinician: Referring Mack Alvidrez: Treating Khyleigh Furney/Extender: Lois Huxley in Treatment: 15 Vital Signs Time Taken: 08:47 Temperature (F): 97.8 Weight (lbs): 128 Pulse (bpm): 88 Respiratory Rate (breaths/min): 18 Blood Pressure (mmHg): 147/84 Reference Range: 80 - 120 mg / dl Electronic Signature(s) Signed: 01/31/2023 12:39:02 PM By: Angelina Pih Entered By: Angelina Pih on 01/31/2023 05:48:54

## 2023-01-31 NOTE — Progress Notes (Signed)
SHERALD, BALBUENA ([id number]) [id number] Page 1 of 6 Visit Report for 01/31/2023 Chief Complaint Document Details Patient Name: Date of Service: [name], [place] [name] 01/31/2023 8:30 A M Medical Record Number: [id number] Patient Account Number: [id number] Date of Birth/Sex: Treating RN: 08-14-1950 (72 y.o. Esmeralda Links Primary Care Provider: Daniel Nones Other Clinician: Referring Provider: Treating Provider/Extender: Lois Huxley in Treatment: 15 Information Obtained from: Patient Chief Complaint Right LE ulcer Electronic Signature(s) Signed: 01/31/2023 9:23:30 AM By: Allen Derry PA-C Entered By: Allen Derry on 01/31/2023 09:23:30 -------------------------------------------------------------------------------- HPI Details Patient Name: Date of Service: [name], Meredith [name] 01/31/2023 8:30 A M Medical Record Number: [id number] Patient Account Number: [id number] Date of Birth/Sex: Treating RN: Sep 30, 1950 (72 y.o. Esmeralda Links Primary Care Provider: Daniel Nones Other Clinician: Referring Provider: Treating Provider/Extender: Lois Huxley in Treatment: 15 History of Present Illness HPI Description: 10-18-2022 patient presents today for initial inspection here in our clinic concerning issues that she has been having with a wound on her right anterior lower extremity. Unfortunately the patient had an issue where she was setting up a table and the table was leg had gotten stuck. When she was trying to dislodge it came loose too quickly and she ended up actually falling and this caused a significant issue with a skin tear on the right shin among other things including hitting her head and hurting her neck as well. Fortunately I do not see any signs overall of infection right now but there is definitely some need for sharp debridement in regard to the necrotic tissue at this point on the right anterior shin. This injury occurred on  10-08-2022 and this is a Teacher, adult education. claim. Patient has an elevated blood pressure reading today but no diagnosis of hypertension she has whitecoat syndrome otherwise she is a fairly healthy individual as far as wound healing is concerned. 6/19; patient was admitted to our clinic last week on 6/14 for traumatic leg wound. Upon having her dressing changed today it was noted she was having more serous drainage from the Opening in the center Of the wound and patient was concerned for infection and came in to be evaluated. Currently she denies fever/chills, increased warmth or erythema to the surrounding skin or Purulent drainage. She has been using collagen and Xeroform to the wound beds. 10-25-2022 upon evaluation today patient's wound actually appears to be doing much better at this time. Fortunately I do not see any signs of active infection locally or systemically which is good news that she actually seems to be better than apparently she was on Wednesday. Nonetheless she still has the center area that keeps collecting fluid and sealing up and then subsequently draining I think if we keep her swelling under control this will alleviate a lot of this issue in general. I discussed that with her today. [name], [name] ([id number]) [id number] Page 2 of 6 11-01-2022 upon evaluation today patient appears to be doing well currently in regard to her wound. She has been tolerating the dressing changes without complication. Fortunately there does not appear to be any signs of active infection locally nor systemically which is great news. I am actually extremely pleased with where things stand at this point. 11-08-2022 upon evaluation today patient appears to be doing well currently in regard to her wound. She has been tolerating the dressing changes without complication. Fortunately there does not appear to be any signs of active infection locally nor systemically at this  time which is  great news. No fevers, chills, nausea, vomiting, or diarrhea. 11-15-2022 upon evaluation today patient's wound is actually showing signs of excellent improvement. There is just a very small area that is remaining open at this time and in general I think that we are making good headway towards closure. I do not see any signs of active infection locally or systemically which is great news. 7/26; traumatic wound on the right anterior mid tibia with. We changed to endoform last week patient is changing the dressing herself. This was initially traumatic with an accident at work 12-06-2022 upon evaluation today patient appears to be doing well currently in regard to her wound. She is actually showing signs of excellent improvement which is great news and in general I do believe that she is making great progress towards complete closure. 12-13-2022 upon evaluation today patient appears to be doing well currently in regard to her wounds this actually looks much better with the surgical wound compared to last week when it was so dry. This is healing quite nicely. 12-20-2022 upon evaluation today patient appears to be doing well currently in regard to her wound I think we can probably switch to the collagen at this point that is Prisma due to the fact that she is not having any real depth to the wound at this time. She is in agreement with plan. 12-27-2022 upon evaluation today patient appears to be doing excellent in regard to her wound. She is tolerating the dressing changes without complication. Fortunately I do not see any signs of active infection at this time which is great news she is very close to complete resolution. 01-10-2023 upon evaluation today patient appears to be doing well currently in regard to her wound which is actually filling in quite nicely. I am actually very pleased with where we stand I do believe that the patient is making great headway towards complete closure. In fact I think she is pretty  much about at surface level and hopeful the new skin will be able to grow over fairly rapidly going forward at this point. 01-31-2023 upon evaluation today patient appears to be doing excellent in regard to her wound which actually appears to be completely healed. I am extremely pleased and happy that she is doing so well. Electronic Signature(s) Signed: 01/31/2023 9:23:43 AM By: Allen Derry PA-C Entered By: Allen Derry on 01/31/2023 09:23:43 -------------------------------------------------------------------------------- Physical Exam Details Patient Name: Date of Service: [name], Meredith [name] 01/31/2023 8:30 A M Medical Record Number: [id number] Patient Account Number: [id number] Date of Birth/Sex: Treating RN: Oct 24, 1950 (72 y.o. Esmeralda Links Primary Care Provider: Daniel Nones Other Clinician: Referring Provider: Treating Provider/Extender: Lois Huxley in Treatment: 15 Constitutional Well-nourished and well-hydrated in no acute distress. Respiratory normal breathing without difficulty. Psychiatric this patient is able to make decisions and demonstrates good insight into disease process. Alert and Oriented x 3. pleasant and cooperative. Notes Upon inspection patient's wound bed actually showed signs of good granulation and epithelization at this point. Fortunately I do not see any signs of worsening overall and I do believe that the patient is making good headway towards complete closure which is great news. Electronic Signature(s) Signed: 01/31/2023 9:23:59 AM By: Allen Derry PA-C Entered By: Allen Derry on 01/31/2023 09:23:58 [place] ([id number]) [id number] Page 3 of 6 -------------------------------------------------------------------------------- Physician Orders Details Patient Name: Date of Service: [name], [place] [name] 01/31/2023 8:30 A M Medical Record Number: [id number] Patient Account Number: [id number]  Date of  Birth/Sex: Treating RN: 07/18/1950 (72 y.o. Esmeralda Links Primary Care Provider: Daniel Nones Other Clinician: Referring Provider: Treating Provider/Extender: Lois Huxley in Treatment: 15 Verbal / Phone Orders: No Diagnosis Coding Discharge From [facility] Services Discharge from Wound Care Center Treatment Complete - apply AandD ointment over healed area, morning and evening. Please call us with any issues to healed area. Electronic Signature(s) Signed: 01/31/2023 9:18:12 AM By: Angelina Pih Signed: 01/31/2023 2:21:22 PM By: Allen Derry PA-C Entered By: Angelina Pih on 01/31/2023 09:18:12 -------------------------------------------------------------------------------- Problem List Details Patient Name: Date of Service: [name], Meredith [name] 01/31/2023 8:30 A M Medical Record Number: [id number] Patient Account Number: [id number] Date of Birth/Sex: Treating RN: 1950/11/19 (72 y.o. Esmeralda Links Primary Care Provider: Daniel Nones Other Clinician: Referring Provider: Treating Provider/Extender: Lois Huxley in Treatment: 15 Active Problems ICD-10 Encounter Code Description Active Date MDM Diagnosis S81.811A Laceration without foreign body, right lower leg, initial encounter 10/18/2022 No Yes L97.812 Non-pressure chronic ulcer of other part of right lower leg with fat layer 10/18/2022 No Yes exposed R03.0 Elevated blood-pressure reading, without diagnosis of hypertension 10/18/2022 No Yes JALAYIA, BAGHERI ([id number]) [id number] Page 4 of 6 Inactive Problems Resolved Problems Electronic Signature(s) Signed: 01/31/2023 9:23:27 AM By: Allen Derry PA-C Entered By: Allen Derry on 01/31/2023 09:23:27 -------------------------------------------------------------------------------- Progress Note Details Patient Name: Date of Service: [name], Meredith [name] 01/31/2023 8:30 A M Medical Record Number: [id number] Patient Account  Number: [id number] Date of Birth/Sex: Treating RN: [birth year] (72 y.o. Esmeralda Links Primary Care Provider: Daniel Nones Other Clinician: Referring Provider: Treating Provider/Extender: Lois Huxley in Treatment: 15 Subjective Chief Complaint Information obtained from Patient Right LE ulcer History of Present Illness (HPI) 10-18-2022 patient presents today for initial inspection here in our clinic concerning issues that she has been having with a wound on her right anterior lower extremity. Unfortunately the patient had an issue where she was setting up a table and the table was leg had gotten stuck. When she was trying to dislodge it came loose too quickly and she ended up actually falling and this caused a significant issue with a skin tear on the right shin among other things including hitting her head and hurting her neck as well. Fortunately I do not see any signs overall of infection right now but there is definitely some need for sharp debridement in regard to the necrotic tissue at this point on the right anterior shin. 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 claim. Patient has an elevated blood pressure reading today but no diagnosis of hypertension she has whitecoat syndrome otherwise she is a fairly healthy individual as far as wound healing is concerned. 6/19; patient was admitted to our clinic last week on 6/14 for traumatic leg wound. Upon having her dressing changed today it was noted she was having more serous drainage from the Opening in the center Of the wound and patient was concerned for infection and came in to be evaluated. Currently she denies fever/chills, increased warmth or erythema to the surrounding skin or Purulent drainage. She has been using collagen and Xeroform to the wound beds. 10-25-2022 upon evaluation today patient's wound actually appears to be doing much better at this time. Fortunately I do not see any signs of  active infection locally or systemically which is good news that she actually seems to be better than apparently she was on Wednesday. Nonetheless she still has  the center area that keeps collecting fluid and sealing up and then subsequently draining I think if we keep her swelling under control this will alleviate a lot of this issue in general. I discussed that with her today. 11-01-2022 upon evaluation today patient appears to be doing well currently in regard to her wound. She has been tolerating the dressing changes without complication. Fortunately there does not appear to be any signs of active infection locally nor systemically which is great news. I am actually extremely pleased with where things stand at this point. 11-08-2022 upon evaluation today patient appears to be doing well currently in regard to her wound. She has been tolerating the dressing changes without complication. Fortunately there does not appear to be any signs of active infection locally nor systemically at this time which is great news. No fevers, chills, nausea, vomiting, or diarrhea. 11-15-2022 upon evaluation today patient's wound is actually showing signs of excellent improvement. There is just a very small area that is remaining open at this time and in general I think that we are making good headway towards closure. I do not see any signs of active infection locally or systemically which is great news. 7/26; traumatic wound on the right anterior mid tibia with. We changed to endoform last week patient is changing the dressing herself. This was initially traumatic with an accident at work 12-06-2022 upon evaluation today patient appears to be doing well currently in regard to her wound. She is actually showing signs of excellent improvement which is great news and in general I do believe that she is making great progress towards complete closure. 12-13-2022 upon evaluation today patient appears to be doing well currently  in regard to her wounds this actually looks much better with the surgical wound compared to last week when it was so dry. This is healing quite nicely. 12-20-2022 upon evaluation today patient appears to be doing well currently in regard to her wound I think we can probably switch to the collagen at this point that is Prisma due to the fact that she is not having any real depth to the wound at this time. She is in agreement with plan. 12-27-2022 upon evaluation today patient appears to be doing excellent in regard to her wound. She is tolerating the dressing changes without complication. Fortunately I do not see any signs of active infection at this time which is great news she is very close to complete resolution. [name], [name] ([id number]) [id number] Page 5 of 6 01-10-2023 upon evaluation today patient appears to be doing well currently in regard to her wound which is actually filling in quite nicely. I am actually very pleased with where we stand I do believe that the patient is making great headway towards complete closure. In fact I think she is pretty much about at surface level and hopeful the new skin will be able to grow over fairly rapidly going forward at this point. 01-31-2023 upon evaluation today patient appears to be doing excellent in regard to her wound which actually appears to be completely healed. I am extremely pleased and happy that she is doing so well. Objective Constitutional Well-nourished and well-hydrated in no acute distress. Vitals Time Taken: 8:47 AM, Weight: 128 lbs, Temperature: 97.8 F, Pulse: 88 bpm, Respiratory Rate: 18 breaths/min, Blood Pressure: 147/84 mmHg. Respiratory normal breathing without difficulty. Psychiatric this patient is able to make decisions and demonstrates good insight into disease process. Alert and Oriented x 3. pleasant and cooperative. General Notes:  Upon inspection patient's wound bed actually showed signs of  good granulation and epithelization at this point. Fortunately I do not see any signs of worsening overall and I do believe that the patient is making good headway towards complete closure which is great news. Integumentary (Hair, Skin) Wound #1 status is Healed - Epithelialized. Original cause of wound was Trauma. The date acquired was: 10/08/2022. The wound has been in treatment 15 weeks. The wound is located on the Right,Midline Lower Leg. The wound measures 0cm length x 0cm width x 0cm depth; 0cm^2 area and 0cm^3 volume. There is no tunneling or undermining noted. There is a none present amount of drainage noted. There is no granulation within the wound bed. There is no necrotic tissue within the wound bed. Assessment Active Problems ICD-10 Laceration without foreign body, right lower leg, initial encounter Non-pressure chronic ulcer of other part of right lower leg with fat layer exposed Elevated blood-pressure reading, without diagnosis of hypertension Plan Discharge From [facility] Services: Discharge from Wound Care Center Treatment Complete - apply AandD ointment over healed area, morning and evening. Please call us with any issues to healed area. 1. I would recommend that we have the patient continue to monitor for any signs of infection or worsening. Overall based on what I am seeing I do believe that she is completely healed I do not see any signs of opening and I think that removing in the right direction as far as that is concerned. 2. I am would recommend as well that she use AandD ointment 2 times a day just to help with the dry skin I think this should benefit her and we will see where things stand at follow-up. At this point I would discontinue wound care services as the patient does appear to be completely healed. I do not see anything that should cause any additional issues with disability as a result of the wound in particular and if she has any concerns or complications she should  definitely contact the office and let me know though I do not expect any issues. Electronic Signature(s) Signed: 02/06/2023 11:11:13 AM By: Allen Derry PA-C Previous Signature: 01/31/2023 9:24:29 AM Version By: Allen Derry PA-C Entered By: Allen Derry on 02/06/2023 11:11:12 [place] ([id number]) [id number] Page 6 of 6 -------------------------------------------------------------------------------- SuperBill Details Patient Name: Date of Service: [name], [place] [name] 01/31/2023 Medical Record Number: [id number] Patient Account Number: [id number] Date of Birth/Sex: Treating RN: Aug 12, 1950 (72 y.o. Esmeralda Links Primary Care Provider: Daniel Nones Other Clinician: Referring Provider: Treating Provider/Extender: Lois Huxley in Treatment: 15 Diagnosis Coding ICD-10 Codes Code Description [phone] Laceration without foreign body, right lower leg, initial encounter L97.812 Non-pressure chronic ulcer of other part of right lower leg with fat layer exposed R03.0 Elevated blood-pressure reading, without diagnosis of hypertension Facility Procedures : CPT4 Code: [id number] Description: [phone] - WOUND CARE VISIT-LEV 2 EST PT Modifier: Quantity: 1 Physician Procedures : CPT4 Code Description Modifier [id number] - WC PHYS LEVEL 3 - EST PT ICD-10 Diagnosis Description S81.811A Laceration without foreign body, right lower leg, initial encounter L97.812 Non-pressure chronic ulcer of other part of right lower leg with  fat layer exposed R03.0 Elevated blood-pressure reading, without diagnosis of hypertension Quantity: 1 Electronic Signature(s) Signed: 01/31/2023 9:26:36 AM By: Allen Derry PA-C Previous Signature: 01/31/2023 9:18:40 AM Version By: Angelina Pih Entered By: Allen Derry on 01/31/2023 09:26:36

## 2023-02-03 ENCOUNTER — Ambulatory Visit: Payer: PRIVATE HEALTH INSURANCE | Attending: Orthopedic Surgery

## 2023-02-03 DIAGNOSIS — R262 Difficulty in walking, not elsewhere classified: Secondary | ICD-10-CM | POA: Insufficient documentation

## 2023-02-03 DIAGNOSIS — R2689 Other abnormalities of gait and mobility: Secondary | ICD-10-CM | POA: Insufficient documentation

## 2023-02-03 DIAGNOSIS — M6281 Muscle weakness (generalized): Secondary | ICD-10-CM | POA: Diagnosis present

## 2023-02-03 DIAGNOSIS — M5459 Other low back pain: Secondary | ICD-10-CM | POA: Insufficient documentation
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
## 2023-02-05 ENCOUNTER — Ambulatory Visit: Payer: PRIVATE HEALTH INSURANCE | Attending: Orthopedic Surgery

## 2023-02-05 DIAGNOSIS — R262 Difficulty in walking, not elsewhere classified: Secondary | ICD-10-CM | POA: Insufficient documentation

## 2023-02-05 DIAGNOSIS — M5459 Other low back pain: Secondary | ICD-10-CM | POA: Diagnosis present

## 2023-02-05 DIAGNOSIS — R2689 Other abnormalities of gait and mobility: Secondary | ICD-10-CM | POA: Insufficient documentation

## 2023-02-05 DIAGNOSIS — M6281 Muscle weakness (generalized): Secondary | ICD-10-CM | POA: Insufficient documentation

## 2023-02-11 ENCOUNTER — Ambulatory Visit: Payer: PRIVATE HEALTH INSURANCE

## 2023-02-13 ENCOUNTER — Ambulatory Visit: Payer: PRIVATE HEALTH INSURANCE

## 2023-02-17 ENCOUNTER — Ambulatory Visit: Payer: Self-pay | Admitting: Dermatology
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
## 2023-02-18 ENCOUNTER — Ambulatory Visit: Payer: PRIVATE HEALTH INSURANCE | Attending: Orthopedic Surgery

## 2023-02-18 DIAGNOSIS — R262 Difficulty in walking, not elsewhere classified: Secondary | ICD-10-CM | POA: Diagnosis present

## 2023-02-18 DIAGNOSIS — M5459 Other low back pain: Secondary | ICD-10-CM | POA: Insufficient documentation

## 2023-02-18 DIAGNOSIS — R2689 Other abnormalities of gait and mobility: Secondary | ICD-10-CM | POA: Insufficient documentation

## 2023-02-18 DIAGNOSIS — M6281 Muscle weakness (generalized): Secondary | ICD-10-CM | POA: Insufficient documentation
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
## 2023-02-27 ENCOUNTER — Ambulatory Visit: Payer: PRIVATE HEALTH INSURANCE

## 2023-02-27 DIAGNOSIS — M5459 Other low back pain: Secondary | ICD-10-CM

## 2023-02-27 DIAGNOSIS — M6281 Muscle weakness (generalized): Secondary | ICD-10-CM

## 2023-02-27 DIAGNOSIS — R262 Difficulty in walking, not elsewhere classified: Secondary | ICD-10-CM
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
## 2023-03-03 ENCOUNTER — Ambulatory Visit: Payer: PRIVATE HEALTH INSURANCE

## 2023-03-03 DIAGNOSIS — M5459 Other low back pain: Secondary | ICD-10-CM | POA: Diagnosis not present

## 2023-03-03 DIAGNOSIS — M6281 Muscle weakness (generalized): Secondary | ICD-10-CM

## 2023-03-03 DIAGNOSIS — R2689 Other abnormalities of gait and mobility: Secondary | ICD-10-CM

## 2023-03-03 DIAGNOSIS — R262 Difficulty in walking, not elsewhere classified: Secondary | ICD-10-CM

## 2023-03-05 ENCOUNTER — Ambulatory Visit: Payer: PRIVATE HEALTH INSURANCE
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
## 2023-03-10 ENCOUNTER — Ambulatory Visit: Payer: PRIVATE HEALTH INSURANCE | Attending: Orthopedic Surgery

## 2023-03-10 DIAGNOSIS — R262 Difficulty in walking, not elsewhere classified: Secondary | ICD-10-CM | POA: Insufficient documentation

## 2023-03-10 DIAGNOSIS — M5459 Other low back pain: Secondary | ICD-10-CM | POA: Insufficient documentation

## 2023-03-10 DIAGNOSIS — R2689 Other abnormalities of gait and mobility: Secondary | ICD-10-CM | POA: Insufficient documentation

## 2023-03-10 DIAGNOSIS — M6281 Muscle weakness (generalized): Secondary | ICD-10-CM | POA: Insufficient documentation
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
## 2023-03-17 ENCOUNTER — Ambulatory Visit: Payer: PRIVATE HEALTH INSURANCE

## 2023-03-17 DIAGNOSIS — R2689 Other abnormalities of gait and mobility: Secondary | ICD-10-CM

## 2023-03-17 DIAGNOSIS — R262 Difficulty in walking, not elsewhere classified: Secondary | ICD-10-CM

## 2023-03-17 DIAGNOSIS — M6281 Muscle weakness (generalized): Secondary | ICD-10-CM

## 2023-03-17 DIAGNOSIS — M5459 Other low back pain: Secondary | ICD-10-CM

## 2023-03-25 ENCOUNTER — Ambulatory Visit (INDEPENDENT_AMBULATORY_CARE_PROVIDER_SITE_OTHER): Payer: Medicare HMO | Admitting: Dermatology

## 2023-03-25 DIAGNOSIS — W908XXA Exposure to other nonionizing radiation, initial encounter: Secondary | ICD-10-CM | POA: Diagnosis not present

## 2023-03-25 DIAGNOSIS — Z85828 Personal history of other malignant neoplasm of skin: Secondary | ICD-10-CM

## 2023-03-25 DIAGNOSIS — L905 Scar conditions and fibrosis of skin: Secondary | ICD-10-CM | POA: Diagnosis not present

## 2023-03-25 DIAGNOSIS — L578 Other skin changes due to chronic exposure to nonionizing radiation: Secondary | ICD-10-CM | POA: Diagnosis not present

## 2023-03-25 DIAGNOSIS — L988 Other specified disorders of the skin and subcutaneous tissue: Secondary | ICD-10-CM

## 2023-03-25 NOTE — Patient Instructions (Signed)

## 2023-03-25 NOTE — Progress Notes (Signed)
Follow-Up Visit   Subjective  [name] is a 72 y.o. female who presents for the following: patient here today for fillers and botox followup.    The patient has spots, moles and lesions to be evaluated, some may be new or changing and the patient may have concern these could be cancer.   The following portions of the chart were reviewed this encounter and updated as appropriate: medications, allergies, medical history  Review of Systems:  No other skin or systemic complaints except as noted in HPI or Assessment and Plan.  Objective  Well appearing patient in no apparent distress; mood and affect are within normal limits.   A focused examination was performed of the following areas: face  Relevant exam findings are noted in the Assessment and Plan.                               Assessment & Plan    FACIAL ELASTOSIS Exam: Rhytides and volume loss.  Treatment Plan: Prior to the procedure, the patient's past medical history, allergies and the rare but potential risks and complications were reviewed with the patient and a signed consent was obtained. Pre and post-treatment care was discussed and instructions provided.  Location: perioral, marionette lines, and chin  Filler Type: Restylane refyne  Procedure: The area was prepped thoroughly with Puracyn. After introducing the needle into the desired treatment area, the syringe plunger was drawn back to ensure there was no flash of blood prior to injecting the filler in order to minimize risk of intravascular injection and vascular occlusion. After injection of the filler, the treated areas were cleansed and iced to reduce swelling. Post-treatment instructions were reviewed with the patient.       Patient tolerated the procedure well. The patient will call with any problems, questions or concerns prior to their next appointment.   Facial Elastosis  Location: See attached image   Informed  consent: Discussed risks (infection, pain, bleeding, bruising, swelling, allergic reaction, paralysis of nearby muscles, eyelid droop, double vision, neck weakness, difficulty breathing, headache, undesirable cosmetic result, and need for additional treatment) and benefits of the procedure, as well as the alternatives.  Informed consent was obtained.  Preparation: The area was cleansed with alcohol.  Procedure Details:  Botox was injected into the dermis with a 30-gauge needle. Pressure applied to any bleeding. Ice packs offered for swelling.  Lot Number:  [id number] Expiration:  01/2025  Total Units Injected:  27.5  Plan: Tylenol may be used for headache.  Allow 2 weeks before returning to clinic for additional dosing as needed. Patient will call for any problems.   Recommend daily broad spectrum sunscreen SPF 30+ to sun-exposed areas, reapply every 2 hours as needed. Call for new or changing lesions.  Staying in the shade or wearing long sleeves, sun glasses (UVA+UVB protection) and wide brim hats (4-inch brim around the entire circumference of the hat) are also recommended for sun protection.   ACTINIC DAMAGE - chronic, secondary to cumulative UV radiation exposure/sun exposure over time - diffuse scaly erythematous macules with underlying dyspigmentation - Recommend daily broad spectrum sunscreen SPF 30+ to sun-exposed areas, reapply every 2 hours as needed.  - Recommend staying in the shade or wearing long sleeves, sun glasses (UVA+UVB protection) and wide brim hats (4-inch brim around the entire circumference of the hat). - Call for new or changing lesions.  HISTORY OF BASAL CELL CARCINOMA OF THE  SKIN Left lateral nasal bridge - S/P Mohs with Dr Adriana Simas at [facility] with persistent thickening of flap scar Pt would like to have re-evaluation with Dr Adriana Simas - No evidence of recurrence today - Recommend regular full body skin exams - Recommend daily broad spectrum sunscreen SPF 30+ to sun-exposed  areas, reapply every 2 hours as needed.  - Call if any new or changing lesions are noted between office visits Thickened Scar at left lateral nasal bridge inferior to medial canthus  Exam: thickened trapdoor at flap side.  Treatment Plan: Recommend referral for repair.  Patient would like referral to Dr. Adriana Simas Mohs surgeon for further evaluation and treatment considerations for thickness of left medial cheek flap after Mohs  Return for keep follow up as scheduled.  IAsher Muir, CMA, am acting as scribe for Armida Sans, MD.   Documentation: I have reviewed the above documentation for accuracy and completeness, and I agree with the above.  Armida Sans, MD

## 2023-03-30 ENCOUNTER — Encounter: Payer: Self-pay | Admitting: Dermatology

## 2023-03-31 ENCOUNTER — Ambulatory Visit: Payer: PRIVATE HEALTH INSURANCE

## 2023-04-08 ENCOUNTER — Encounter: Payer: Self-pay | Admitting: Dermatology

## 2023-04-14 ENCOUNTER — Ambulatory Visit: Payer: PRIVATE HEALTH INSURANCE
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
## 2023-04-21 ENCOUNTER — Emergency Department: Payer: PRIVATE HEALTH INSURANCE

## 2023-04-21 ENCOUNTER — Other Ambulatory Visit: Payer: Self-pay

## 2023-04-21 ENCOUNTER — Ambulatory Visit: Payer: PRIVATE HEALTH INSURANCE

## 2023-04-21 ENCOUNTER — Encounter: Payer: Self-pay | Admitting: Emergency Medicine

## 2023-04-21 ENCOUNTER — Emergency Department
Admission: EM | Admit: 2023-04-21 | Discharge: 2023-04-21 | Disposition: A | Discharge status: Home / Self Care | Payer: Worker's Compensation | Attending: Emergency Medicine | Admitting: Emergency Medicine

## 2023-04-21 DIAGNOSIS — W19XXXA Unspecified fall, initial encounter: Secondary | ICD-10-CM

## 2023-04-21 DIAGNOSIS — W0110XA Fall on same level from slipping, tripping and stumbling with subsequent striking against unspecified object, initial encounter: Secondary | ICD-10-CM | POA: Diagnosis not present

## 2023-04-21 DIAGNOSIS — S0990XA Unspecified injury of head, initial encounter: Secondary | ICD-10-CM

## 2023-04-21 DIAGNOSIS — S0001XA Abrasion of scalp, initial encounter: Secondary | ICD-10-CM | POA: Insufficient documentation

## 2023-04-21 HISTORY — DX: Central retinal vein occlusion, unspecified eye, stable: H34.8192

## 2023-04-21 NOTE — ED Provider Notes (Signed)
[facility] Provider Note    Event Date/Time   First MD Initiated Contact with Patient 04/21/23 1220     (approximate)   History   Fall   HPI  [name] is a 72 y.o. female   with history of osteopenia basal cell carcinoma presents emergency department complaining of a fall.  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  No LOC.  States did have some bleeding from the area.  Thinks her immunizations are up-to-date.  No LOC.  No blood thinners.  No nausea or vomiting.      Physical Exam   Triage Vital Signs: ED Triage Vitals [04/21/23 0742]  Encounter Vitals Group     BP (!) 194/115     Systolic BP Percentile      Diastolic BP Percentile      Pulse Rate 83     Resp 18     Temp 97.6 F (36.4 C)     Temp Source Oral     SpO2 99 %     Weight 130 lb (59 kg)     Height 5\' 3"  (1.6 m)     Head Circumference      Peak Flow      Pain Score 6     Pain Loc      Pain Education      Exclude from Growth Chart     Most recent vital signs: Vitals:   04/21/23 0742 04/21/23 1055  BP: (!) 194/115 (!) 168/107  Pulse: 83 97  Resp: 18 16  Temp: 97.6 F (36.4 C)   SpO2: 99% 98%     General: Awake, no distress.   CV:  Good peripheral perfusion. regular rate and  rhythm Resp:  Normal effort.  Abd:  No distention.   Other:  Cranial nerves II through XII grossly intact, posterior skull has a hematoma noted with an abrasion, no open laceration, bleeding is controlled at this time, grips are equal bilaterally, patient is able to walk without difficulty, neurovascular is intact   ED Results / Procedures / Treatments   Labs (all labs ordered are listed, but only abnormal results are displayed) Labs Reviewed - No data to display   EKG     RADIOLOGY  CT of the head and cervical spine   PROCEDURES:   Procedures   MEDICATIONS ORDERED IN ED: Medications - No data to  display   IMPRESSION / MDM / ASSESSMENT AND PLAN / ED COURSE  I reviewed the triage vital signs and the nursing notes.                              Differential diagnosis includes, but is not limited to, subdural, subarachnoid, contusion, hematoma, laceration, fracture  Patient's presentation is most consistent with acute illness / injury with system symptoms.   CT of the head and cervical spine reviewed independently reviewed interpreted by me as being negative for any acute abnormality  I do not feel that the braised area needs sutures at this time, it is very shallow.  Did go over all exam findings and radiology findings with patient.  She appears to be very well.  Discharged in stable condition.  Strict instructions to return if worsening.  She is in agreement treatment plan.      FINAL CLINICAL IMPRESSION(S) / ED DIAGNOSES   Final diagnoses:  Fall, initial encounter  Minor head injury, initial encounter  Abrasion of scalp, initial encounter     Rx / DC Orders   ED Discharge Orders     None        Note:  This document was prepared using Dragon voice recognition software and may include unintentional dictation errors.    Faythe Ghee, PA-C 04/21/23 1359    Sharyn Creamer, MD 04/21/23 [phone]

## 2023-04-21 NOTE — ED Triage Notes (Addendum)
Patient to ED via POV after a fall. Patient states she tripped hitting the back of her head. Slight blood noted to back of head but bleeding controlled. Ambulatory to triage. Denies LOC or blood thinners.
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
## 2023-04-22 ENCOUNTER — Other Ambulatory Visit: Payer: Self-pay | Admitting: Physician Assistant

## 2023-04-22 DIAGNOSIS — S43422D Sprain of left rotator cuff capsule, subsequent encounter: Secondary | ICD-10-CM

## 2023-04-28 ENCOUNTER — Ambulatory Visit: Payer: Medicare HMO | Admitting: Dermatology

## 2023-04-28 ENCOUNTER — Encounter: Payer: Self-pay | Admitting: Dermatology

## 2023-04-28 ENCOUNTER — Ambulatory Visit: Payer: PRIVATE HEALTH INSURANCE

## 2023-04-28 DIAGNOSIS — C44722 Squamous cell carcinoma of skin of right lower limb, including hip: Secondary | ICD-10-CM | POA: Diagnosis not present

## 2023-04-28 DIAGNOSIS — D492 Neoplasm of unspecified behavior of bone, soft tissue, and skin: Secondary | ICD-10-CM | POA: Diagnosis not present

## 2023-04-28 DIAGNOSIS — C4492 Squamous cell carcinoma of skin, unspecified: Secondary | ICD-10-CM

## 2023-04-28 DIAGNOSIS — W908XXA Exposure to other nonionizing radiation, initial encounter: Secondary | ICD-10-CM

## 2023-04-28 DIAGNOSIS — L578 Other skin changes due to chronic exposure to nonionizing radiation: Secondary | ICD-10-CM | POA: Diagnosis not present

## 2023-04-28 HISTORY — DX: Squamous cell carcinoma of skin, unspecified: C44.92

## 2023-04-28 NOTE — Patient Instructions (Addendum)

## 2023-04-28 NOTE — Progress Notes (Unsigned)
   Follow-Up Visit   Subjective  [name] is a 72 y.o. female who presents for the following: R lower leg, few months, painful, pt has taken Doxycycline 100mg  1 po bid x 1 wk 03/07/2023 The patient has spots, moles and lesions to be evaluated, some may be new or changing and the patient may have concern these could be cancer.   The following portions of the chart were reviewed this encounter and updated as appropriate: medications, allergies, medical history  Review of Systems:  No other skin or systemic complaints except as noted in HPI or Assessment and Plan.  Objective  Well appearing patient in no apparent distress; mood and affect are within normal limits.   A focused examination was performed of the following areas: R lower leg  Relevant exam findings are noted in the Assessment and Plan.  R med upper ankle 7.26mm violaceous firm pap   Assessment & Plan     NEOPLASM OF SKIN R med upper ankle Epidermal / dermal shaving  Lesion diameter (cm):  0.7 Informed consent: discussed and consent obtained   Patient was prepped and draped in usual sterile fashion: area prepped with alcohol. Anesthesia: the lesion was anesthetized in a standard fashion   Anesthetic:  1% lidocaine w/ epinephrine 1-100,000 buffered w/ 8.4% NaHCO3 Instrument used: flexible razor blade   Hemostasis achieved with: pressure, aluminum chloride and electrodesiccation   Outcome: patient tolerated procedure well    Destruction of lesion  Destruction method: electrodesiccation and curettage   Informed consent: discussed and consent obtained   Curettage performed in three different directions: Yes   Electrodesiccation performed over the curetted area: Yes   Final wound size (cm):  0.8 Hemostasis achieved with:  pressure, aluminum chloride and electrodesiccation Outcome: patient tolerated procedure well with no complications   Post-procedure details: wound care instructions given   Additional  details:  Mupirocin ointment and Bandaid applied  Specimen 1 - Surgical pathology Differential Diagnosis: ISK vs Hypertrophic AK r/o SCC  Check Margins: yes 7.41mm violaceous firm pap EDC Recommend compression sock apply qam after wound care while wound is healing  ACTINIC DAMAGE - chronic, secondary to cumulative UV radiation exposure/sun exposure over time - diffuse scaly erythematous macules with underlying dyspigmentation - Recommend daily broad spectrum sunscreen SPF 30+ to sun-exposed areas, reapply every 2 hours as needed.  - Recommend staying in the shade or wearing long sleeves, sun glasses (UVA+UVB protection) and wide brim hats (4-inch brim around the entire circumference of the hat). - Call for new or changing lesions.   Return for as scheduled with Dr. Gwen Pounds.  I, Ardis Rowan, RMA, am acting as scribe for Willeen Niece, MD .   Documentation: I have reviewed the above documentation for accuracy and completeness, and I agree with the above.  Willeen Niece, MD

## 2023-05-01 ENCOUNTER — Other Ambulatory Visit: Payer: Self-pay | Admitting: Internal Medicine

## 2023-05-01 ENCOUNTER — Ambulatory Visit
Admission: RE | Admit: 2023-05-01 | Discharge: 2023-05-01 | Disposition: A | Discharge status: Home / Self Care | Payer: Medicare HMO | Source: Ambulatory Visit | Attending: Physician Assistant | Admitting: Physician Assistant

## 2023-05-01 DIAGNOSIS — S43422D Sprain of left rotator cuff capsule, subsequent encounter: Secondary | ICD-10-CM | POA: Insufficient documentation

## 2023-05-01 DIAGNOSIS — Z1231 Encounter for screening mammogram for malignant neoplasm of breast: Secondary | ICD-10-CM

## 2023-05-01 LAB — SURGICAL PATHOLOGY

## 2023-05-05 ENCOUNTER — Ambulatory Visit: Payer: PRIVATE HEALTH INSURANCE

## 2023-05-08 ENCOUNTER — Telehealth: Payer: Self-pay

## 2023-05-08 NOTE — Telephone Encounter (Addendum)
 Called and discussed bx results with patient. She verbalized understanding and denied further questions.   ----- Message from Rexene Rattler sent at 05/06/2023 11:23 AM EST -----  1. Skin, R med upper ankle :       WELL DIFFERENTIATED SQUAMOUS CELL CARCINOMA, DEEP MARGIN INVOLVED   SCC skin cancer- already treated with EDC at time of biopsy   - please call patient

## 2023-05-08 NOTE — Telephone Encounter (Signed)
-----   Message from Rexene Rattler sent at 05/06/2023 11:23 AM EST -----  1. Skin, R med upper ankle :       WELL DIFFERENTIATED SQUAMOUS CELL CARCINOMA, DEEP MARGIN INVOLVED   SCC skin cancer- already treated with EDC at time of biopsy   - please call patient

## 2023-05-09 ENCOUNTER — Other Ambulatory Visit: Payer: Self-pay

## 2023-05-09 MED ORDER — METHOCARBAMOL 500 MG PO TABS
500.0000 mg | ORAL_TABLET | Freq: Every day | ORAL | 0 refills | Status: DC
  Filled 2023-05-09: qty 15, 15d supply, fill #0

## 2023-05-29 NOTE — Progress Notes (Signed)
[facility] - Clinic Note  05/30/2023   CHIEF COMPLAINT Patient presents for Retina Evaluation  HISTORY OF PRESENT ILLNESS: [name] is a 73 y.o. female who presents to the clinic today for:  HPI     Retina Evaluation   In both eyes.  This started 2 weeks ago.  Duration of 2 weeks.  I, the attending physician,  performed the HPI with the patient and updated documentation appropriately.        Comments   Patient here for Retina Evaluation. Patient states this is for a second opinion. To see if any other options for CRVO has has and injection in OD January 13 th. Vision everything is blurry. Not crisp and clear. Hard to see letter on chart. No eye pain. PT had injection last Monday       Last edited by Rennis Chris, MD on 05/30/2023 12:24 PM.    Patient states that she has had one injection with [facility] on 01.22.25. She is here today wanting a second opinion.   Referring physician: Lynnea Ferrier, MD [address] [facility] [place],  [place] [address]  HISTORICAL INFORMATION:  Selected notes from the MEDICAL RECORD NUMBER Referred by Altamease Oiler for 2nd retinal opinion for CRVO OD -- following w/ Dr. Chipper Herb at [facility]:  Ocular Hx- PMH-   CURRENT MEDICATIONS: No current outpatient medications on file. (Ophthalmic Drugs)   No current facility-administered medications for this visit. (Ophthalmic Drugs)   Current Outpatient Medications (Other)  Medication Sig   cetirizine (ZYRTEC) 10 MG chewable tablet Chew 10 mg by mouth daily.   cholecalciferol (VITAMIN D) 1000 units tablet Take 1,000 Units by mouth daily.   levothyroxine (SYNTHROID) 50 MCG tablet TAKE 1 TABLET BY MOUTH ONCE DAILY ON AN EMPTY STOMACH WITH A GLASS OF WATER AT LEAST 30-60 MINUTES BEFORE BREAKFAST.   Multiple Vitamin (MULTI-VITAMINS) TABS Take by mouth.   cyclobenzaprine (FLEXERIL) 5 MG tablet Take 1 tablet (5 mg total) by mouth at bedtime.    fluticasone (FLONASE) 50 MCG/ACT nasal spray USE TWO SPRAYS IN EACH NOSTRIL DAILY   methocarbamol (ROBAXIN) 500 MG tablet Take 1 tablet (500 mg total) by mouth at bedtime.   No current facility-administered medications for this visit. (Other)   REVIEW OF SYSTEMS: ROS   Positive for: Eyes Last edited by Laddie Aquas, COA on 05/30/2023  8:40 AM.     ALLERGIES Allergies  Allergen Reactions   Iodinated Contrast Media Hives   Levaquin [Levofloxacin] Swelling    Knee   Bacitracin Rash   Neosporin [Bacitracin-Polymyxin B] Rash   PAST MEDICAL HISTORY Past Medical History:  Diagnosis Date   Arthritis    Basal cell carcinoma 12/08/2008   right sup medial breast   Basal cell carcinoma 06/10/2017   left lat nasal bridge inf to medial canthus   Basal cell carcinoma 03/02/2019   right distal med calf   CRVO (central retinal vein occlusion)    History of dysplastic nevus 05/02/2010   right sup pubic/mild   Motion sickness    boats   SCC (squamous cell carcinoma) 04/28/2023   right medial upper ankle scc trx with ED&C   Vertigo 2020   No issues since Rehab   Past Surgical History:  Procedure Laterality Date   ABDOMINAL HYSTERECTOMY     BROW LIFT Bilateral 05/24/2022   Procedure: BLEPHAROPLASTY UPPER EYELID; W/EXCESS SKIN BLEPHAROPTOSIS REPAIR; RESECT EX BILATERAL;  Surgeon: Ether Griffins,  Hubbard Robinson, MD;  Location: [facility];  Service: Ophthalmology;  Laterality: Bilateral;  needs to be first   COLONOSCOPY     several times   COLONOSCOPY WITH PROPOFOL N/A 04/13/2021   Procedure: COLONOSCOPY WITH PROPOFOL;  Surgeon: Pasty Spillers, MD;  Location: [facility];  Service: Gastroenterology;  Laterality: N/A;   KNEE SURGERY     TOTAL VAGINAL HYSTERECTOMY     FAMILY HISTORY Family History  Problem Relation Age of Onset   Lung cancer Father    Breast cancer Neg Hx    SOCIAL HISTORY Social History   Tobacco Use   Smoking status: Never   Smokeless tobacco: Never  Vaping  Use   Vaping status: Never Used  Substance Use Topics   Alcohol use: No    Alcohol/week: 0.0 standard drinks of alcohol   Drug use: Not Currently       OPHTHALMIC EXAM:  Base Eye Exam     Visual Acuity (Snellen - Linear)       Right Left   Dist [place] 20/200 -2 20/20   Dist ph [place] NI   Patient is mono vision. OD distance and OS near. Used Jcard for OS.        Tonometry (Tonopen, 8:36 AM)       Right Left   Pressure 19 16         Pupils       Dark Light Shape React APD   Right 4 3 Round Brisk None   Left 4 3 Round Brisk None         Visual Fields (Counting fingers)       Left Right    Full Full         Extraocular Movement       Right Left    Full, Ortho Full, Ortho         Neuro/Psych     Oriented x3: Yes   Mood/Affect: Normal         Dilation     Both eyes: 1.0% Mydriacyl, 2.5% Phenylephrine @ 8:36 AM           Slit Lamp and Fundus Exam     External Exam       Right Left   External Normal Normal         Slit Lamp Exam       Right Left   Lids/Lashes Dermatochalasis - upper lid Dermatochalasis - upper lid, Meibomian gland dysfunction   Conjunctiva/Sclera White and quiet Small nasal Pinguecula   Cornea Arcus, 6 cut RK, 3+ Punctate epithelial erosions, Well healed temporal cataract wound Arcus, 4 cut RK, Well healed temporal cataract wound - superiorly, Debris in tear film, 2+ Punctate epithelial erosions   Anterior Chamber Deep and clear Deep and clear   Iris Round and dilated Round and dilated   Lens PC IOL in good postion with open PC PC IOL in good postion with open PC   Anterior Vitreous Vitreous syneresis Vitreous syneresis         Fundus Exam       Right Left   Disc Pink and sharp, + heme, hyperemia greatest nasally 2+ Pallor, PPA, + cupping   C/D Ratio 0.3 0.7   Macula Flat, Blunted foveal reflex, diffuse IRH, + central edema Flat, Blunted foveal reflex, central drusen, RPE mottling   Vessels Vascular attenuation,  Tortuous, CRVO Vascular attenuation, Tortuous   Periphery Attached, 360 IRH/DBH Attached, No heme  Refraction     Manifest Refraction   Unable to correct OD           IMAGING AND PROCEDURES  Imaging and Procedures for 05/30/2023  OCT, Retina - OU - Both Eyes       Right Eye Quality was good. Central Foveal Thickness: 353. Progression has no prior data. Findings include no SRF, abnormal foveal contour, retinal drusen , subretinal hyper-reflective material, intraretinal hyper-reflective material, outer retinal atrophy (Central edema and IRH/SRHM).   Left Eye Quality was good. Central Foveal Thickness: 250. Progression has no prior data. Findings include normal foveal contour, no IRF, no SRF, retinal drusen .   Notes *Images captured and stored on drive  Diagnosis / Impression:  Nonexudative ARMD OU OD: Central edema and IRH/SRHM -- CRVO, +drusen OS: +drusen  Clinical management:  See below  Abbreviations: NFP - Normal foveal profile. CME - cystoid macular edema. PED - pigment epithelial detachment. IRF - intraretinal fluid. SRF - subretinal fluid. EZ - ellipsoid zone. ERM - epiretinal membrane. ORA - outer retinal atrophy. ORT - outer retinal tubulation. SRHM - subretinal hyper-reflective material. IRHM - intraretinal hyper-reflective material           ASSESSMENT/PLAN:   ICD-10-CM   1. Central retinal vein occlusion with macular edema of right eye  H34.8110 OCT, Retina - OU - Both Eyes    2. Intermediate stage nonexudative age-related macular degeneration of both eyes  H35.3132     3. Primary open angle glaucoma (POAG) of both eyes, moderate stage  H40.1132     4. Pseudophakia of both eyes  Z96.1      **patient is here for a second retina opinion -- currently under the care of Dr. Chipper Herb at [facility]**  CRVO w/ CME, OD - The natural history of retinal vein occlusion and macular edema and treatment options including observation, laser  photocoagulation, and intravitreal antiVEGF injection with Avastin, Lucentis, Eylea, Vabysmo and intravitreal injection of steroids with triamcinolone and Ozurdex and the complications of these procedures including loss of vision, infection, cataract, glaucoma, and retinal detachment were discussed with patient. - Specifically discussed patient stabilization with anti-VEGF agents and increased potential for visual improvements.  Also discussed need for frequent follow up and potentially multiple injections given the chronic nature of the disease process - BCVA OD 20/200 - exam shows 360 IRH consistent with CRVO - OCT shows YN:WGNFAOZ edema and IRH/SRHM consistent with CRVO  - patient had first injection on 01.20.25 @ [facility] (Dr. Chipper Herb)  - discussed findings, mechanism of pathology, prognosis and treatment options  - recommend continuation of anti-VEGF therapy per Dr. Chipper Herb  - pt can f/u here prn  2. Age related macular degeneration, non-exudative, OU - The incidence, anatomy, and pathology of dry AMD, risk of progression, and the AREDS and AREDS 2 studies including smoking risks discussed with patient. - pt currently on AREDS 2 supplements  - Recommend amsler grid monitoring  3. Glaucoma, OU  - under the expert care of Dr. Inez Pilgrim  - IOP 19,16  - She is using Latanoprost OS at bedtime, Dorz/Timolol OU BID  4. Pseudophakia OU  - s/p CE/IOL OU ([place])  - IOL in good position, doing well  - monitor   5. History of RK  - done at [facility] 20+ yrs ago  - stable   Ophthalmic Meds Ordered this visit:  No orders of the defined types were placed in this encounter.    Return if symptoms  worsen or fail to improve.  There are no Patient Instructions on file for this visit.  Explained the diagnoses, plan, and follow up with the patient and they expressed understanding.  Patient expressed understanding of the importance of proper follow up care.   This document serves  as a record of services personally performed by Karie Chimera, MD, PhD. It was created on their behalf by Glee Arvin. Manson Passey, OA an ophthalmic technician. The creation of this record is the provider's dictation and/or activities during the visit.    Electronically signed by: Glee Arvin. Manson Passey, OA 05/30/23 12:25 PM  This document serves as a record of services personally performed by Karie Chimera, MD, PhD. It was created on their behalf by Charlette Caffey, COT an ophthalmic technician. The creation of this record is the provider's dictation and/or activities during the visit.    Electronically signed by:  Charlette Caffey, COT  05/30/23 12:25 PM  Karie Chimera, M.D., Ph.D. Diseases & Surgery of the Retina and Vitreous [facility] 05/30/2023  I have reviewed the above documentation for accuracy and completeness, and I agree with the above. Karie Chimera, M.D., Ph.D. 05/30/23 12:31 PM   Abbreviations: M myopia (nearsighted); A astigmatism; H hyperopia (farsighted); P presbyopia; Mrx spectacle prescription;  CTL contact lenses; OD right eye; OS left eye; OU both eyes  XT exotropia; ET esotropia; PEK punctate epithelial keratitis; PEE punctate epithelial erosions; DES dry eye syndrome; MGD meibomian gland dysfunction; ATs artificial tears; PFAT's preservative free artificial tears; NSC nuclear sclerotic cataract; PSC posterior subcapsular cataract; ERM epi-retinal membrane; PVD posterior vitreous detachment; RD retinal detachment; DM diabetes mellitus; DR diabetic retinopathy; NPDR non-proliferative diabetic retinopathy; PDR proliferative diabetic retinopathy; CSME clinically significant macular edema; DME diabetic macular edema; dbh dot blot hemorrhages; CWS cotton wool spot; POAG primary open angle glaucoma; C/D cup-to-disc ratio; HVF humphrey visual field; GVF goldmann visual field; OCT optical coherence tomography; IOP intraocular pressure; BRVO Branch retinal vein  occlusion; CRVO central retinal vein occlusion; CRAO central retinal artery occlusion; BRAO branch retinal artery occlusion; RT retinal tear; SB scleral buckle; PPV pars plana vitrectomy; VH Vitreous hemorrhage; PRP panretinal laser photocoagulation; IVK intravitreal kenalog; VMT vitreomacular traction; MH Macular hole;  NVD neovascularization of the disc; NVE neovascularization elsewhere; AREDS age related eye disease study; ARMD age related macular degeneration; POAG primary open angle glaucoma; EBMD epithelial/anterior basement membrane dystrophy; ACIOL anterior chamber intraocular lens; IOL intraocular lens; PCIOL posterior chamber intraocular lens; Phaco/IOL phacoemulsification with intraocular lens placement; PRK photorefractive keratectomy; LASIK laser assisted in situ keratomileusis; HTN hypertension; DM diabetes mellitus; COPD chronic obstructive pulmonary disease

## 2023-05-30 ENCOUNTER — Encounter (INDEPENDENT_AMBULATORY_CARE_PROVIDER_SITE_OTHER): Payer: Self-pay | Admitting: Ophthalmology

## 2023-05-30 ENCOUNTER — Ambulatory Visit (INDEPENDENT_AMBULATORY_CARE_PROVIDER_SITE_OTHER): Payer: Medicare HMO | Admitting: Ophthalmology

## 2023-05-30 DIAGNOSIS — Z961 Presence of intraocular lens: Secondary | ICD-10-CM | POA: Diagnosis not present

## 2023-05-30 DIAGNOSIS — H401132 Primary open-angle glaucoma, bilateral, moderate stage: Secondary | ICD-10-CM | POA: Diagnosis not present

## 2023-05-30 DIAGNOSIS — Z9889 Other specified postprocedural states: Secondary | ICD-10-CM

## 2023-05-30 DIAGNOSIS — H353132 Nonexudative age-related macular degeneration, bilateral, intermediate dry stage: Secondary | ICD-10-CM | POA: Diagnosis not present

## 2023-05-30 DIAGNOSIS — H3581 Retinal edema: Secondary | ICD-10-CM

## 2023-05-30 DIAGNOSIS — H34811 Central retinal vein occlusion, right eye, with macular edema: Secondary | ICD-10-CM

## 2023-06-16 ENCOUNTER — Ambulatory Visit
Admission: RE | Admit: 2023-06-16 | Discharge: 2023-06-16 | Disposition: A | Discharge status: Home / Self Care | Payer: Medicare HMO | Source: Ambulatory Visit | Attending: Internal Medicine | Admitting: Internal Medicine

## 2023-06-16 DIAGNOSIS — Z1231 Encounter for screening mammogram for malignant neoplasm of breast: Secondary | ICD-10-CM | POA: Insufficient documentation

## 2023-06-20 DIAGNOSIS — M7582 Other shoulder lesions, left shoulder: Secondary | ICD-10-CM | POA: Insufficient documentation

## 2023-07-01 ENCOUNTER — Ambulatory Visit: Payer: Medicare HMO | Admitting: Dermatology

## 2023-08-02 ENCOUNTER — Ambulatory Visit
Admission: EM | Admit: 2023-08-02 | Discharge: 2023-08-02 | Disposition: A | Discharge status: Home / Self Care | Attending: Family Medicine | Admitting: Family Medicine

## 2023-08-02 DIAGNOSIS — W57XXXA Bitten or stung by nonvenomous insect and other nonvenomous arthropods, initial encounter: Secondary | ICD-10-CM | POA: Insufficient documentation

## 2023-08-02 DIAGNOSIS — L03313 Cellulitis of chest wall: Secondary | ICD-10-CM | POA: Diagnosis present

## 2023-08-02 DIAGNOSIS — S20361A Insect bite (nonvenomous) of right front wall of thorax, initial encounter: Secondary | ICD-10-CM | POA: Insufficient documentation

## 2023-08-02 DIAGNOSIS — R350 Frequency of micturition: Secondary | ICD-10-CM | POA: Diagnosis not present

## 2023-08-02 LAB — URINALYSIS, W/ REFLEX TO CULTURE (INFECTION SUSPECTED)
Bilirubin Urine: NEGATIVE
Glucose, UA: NEGATIVE mg/dL
Hgb urine dipstick: NEGATIVE
Ketones, ur: NEGATIVE mg/dL
Leukocytes,Ua: NEGATIVE
Nitrite: NEGATIVE
Protein, ur: NEGATIVE mg/dL
Specific Gravity, Urine: <1.005 — ABNORMAL LOW (ref 1.005–1.030)
pH: 5.5 (ref 5.0–8.0)

## 2023-08-02 MED ORDER — TRIAMCINOLONE ACETONIDE 0.1 % EX CREA: 1.0000 | TOPICAL_CREAM | Freq: Two times a day (BID) | CUTANEOUS | 0 refills | Status: DC

## 2023-08-02 MED ORDER — DOXYCYCLINE HYCLATE 100 MG PO CAPS: 100.0000 mg | ORAL_CAPSULE | Freq: Two times a day (BID) | ORAL | 0 refills | Status: AC

## 2023-08-02 MED ORDER — PREDNISONE 20 MG PO TABS: 40.0000 mg | ORAL_TABLET | Freq: Every day | ORAL | 0 refills | Status: AC

## 2023-08-02 NOTE — Discharge Instructions (Signed)
 The urinalysis does not show any white blood cells or red blood cells or nitrites.  This is not consistent with a urinary tract infection at this time.  Urine culture is still sent and if that is positive and you need a different antibiotic from what I have sent already, staff will call you.  That should result within 2 to 3 days.  Take doxycycline 100 mg --1 capsule 2 times daily for 7 days; this is sent in for the swelling in your skin on your shoulder, but also would be a treatment for urinary tract infection if that is still going on.  Take prednisone 20 mg--2 daily for 5 days  Triamcinolone cream--apply 2 times daily to the rash area until better, about 10 to 14 days.  If your urine culture is negative and your urinary symptoms continue, please follow-up with your primary care so they can help you with further evaluation and treatment.

## 2023-08-02 NOTE — ED Provider Notes (Signed)
 [facility]    CSN: [id number] Arrival date & time: 08/02/23  1306      History   Chief Complaint Chief Complaint  Patient presents with   Insect Bite   Urinary Frequency    HPI [name] is a 73 y.o. female.    Urinary Frequency  Here for itching and swelling and tenderness over her right collarbone.  On March 27 something stung her and she felt sudden itching and burning in her right collarbone area.  Since then it has been swelling some and is itching a lot.  She now feels a little tingling down in her right hand.  She has felt warm but not take her temperature.  Her temperature here is 97.9  She also was prescribed nitrofurantoin for a UTI about 7 to 10 days ago.  She finished that prescription also on March 27.  Before she started the prescription of antibiotic she was having urinary frequency and pressure.  No dysuria.  The symptoms improved but they are still there.  She would like to be evaluated for UTI  Allergies include Levaquin and cephalexin and bacitracin and IV contrast.    Past Medical History:  Diagnosis Date   Arthritis    Basal cell carcinoma 12/08/2008   right sup medial breast   Basal cell carcinoma 06/10/2017   left lat nasal bridge inf to medial canthus   Basal cell carcinoma 03/02/2019   right distal med calf   CRVO (central retinal vein occlusion)    History of dysplastic nevus 05/02/2010   right sup pubic/mild   Motion sickness    boats   SCC (squamous cell carcinoma) 04/28/2023   right medial upper ankle scc trx with ED&C   Vertigo 2020   No issues since Rehab    Patient Active Problem List   Diagnosis Date Noted   Screen for colon cancer    Polyp of descending colon    Osteopenia of multiple sites 08/11/2018   Near syncope 04/22/2018   Personal history of other malignant neoplasm of skin 09/24/2017   Osteoarthritis of carpometacarpal (CMC) joint of thumb 09/05/2017   Costochondritis 12/26/2015   Allergy  02/09/2015   Adult hypothyroidism 02/09/2015   OP (osteoporosis) 02/09/2015   AV (anaerobic vaginosis) 11/18/2014   Non-traumatic rotator cuff tear 09/29/2013    Past Surgical History:  Procedure Laterality Date   ABDOMINAL HYSTERECTOMY     BROW LIFT Bilateral 05/24/2022   Procedure: BLEPHAROPLASTY UPPER EYELID; W/EXCESS SKIN BLEPHAROPTOSIS REPAIR; RESECT EX BILATERAL;  Surgeon: Imagene Riches, MD;  Location: [facility];  Service: Ophthalmology;  Laterality: Bilateral;  needs to be first   COLONOSCOPY     several times   COLONOSCOPY WITH PROPOFOL N/A 04/13/2021   Procedure: COLONOSCOPY WITH PROPOFOL;  Surgeon: Pasty Spillers, MD;  Location: [facility];  Service: Gastroenterology;  Laterality: N/A;   KNEE SURGERY     TOTAL VAGINAL HYSTERECTOMY      OB History   No obstetric history on file.      Home Medications    Prior to Admission medications   Medication Sig Start Date End Date Taking? Authorizing Provider  doxycycline (VIBRAMYCIN) 100 MG capsule Take 1 capsule (100 mg total) by mouth 2 (two) times daily for 7 days. 08/02/23 08/09/23 Yes Leiliana Foody, Janace Aris, MD  levothyroxine (SYNTHROID) 50 MCG tablet TAKE 1 TABLET BY MOUTH ONCE DAILY ON AN EMPTY STOMACH WITH A GLASS OF WATER AT LEAST 30-60 MINUTES BEFORE BREAKFAST. 03/07/21  Yes   predniSONE (DELTASONE) 20 MG tablet Take 2 tablets (40 mg total) by mouth daily with breakfast for 5 days. 08/02/23 08/07/23 Yes Zenia Resides, MD  triamcinolone cream (KENALOG) 0.1 % Apply 1 Application topically 2 (two) times daily. To affected area till better 08/02/23  Yes Maveryk Renstrom, Janace Aris, MD  cetirizine (ZYRTEC) 10 MG chewable tablet Chew 10 mg by mouth daily.    [provider]  cholecalciferol (VITAMIN D) 1000 units tablet Take 1,000 Units by mouth daily.    [provider]  cyclobenzaprine (FLEXERIL) 5 MG tablet Take 1 tablet (5 mg total) by mouth at bedtime. 01/10/23     fluticasone (FLONASE) 50 MCG/ACT nasal  spray USE TWO SPRAYS IN [facility] NOSTRIL DAILY 06/02/20 05/15/22  Linus Salmons, MD  methocarbamol (ROBAXIN) 500 MG tablet Take 1 tablet (500 mg total) by mouth at bedtime. 05/09/23   Ivery Quale, PA-C  Multiple Vitamin (MULTI-VITAMINS) TABS Take by mouth.    [provider]    Family History Family History  Problem Relation Age of Onset   Lung cancer Father    Breast cancer Neg Hx     Social History Social History   Tobacco Use   Smoking status: Never   Smokeless tobacco: Never  Vaping Use   Vaping status: Never Used  Substance Use Topics   Alcohol use: No    Alcohol/week: 0.0 standard drinks of alcohol   Drug use: Not Currently     Allergies   Gadolinium derivatives, Cephalexin, Iodinated contrast media, Levaquin [levofloxacin], Bacitracin, and Neosporin [bacitracin-polymyxin b]   Review of Systems Review of Systems  Genitourinary:  Positive for frequency.     Physical Exam Triage Vital Signs ED Triage Vitals  Encounter Vitals Group     BP 08/02/23 1317 (!) 175/103     Systolic BP Percentile --      Diastolic BP Percentile --      Pulse Rate 08/02/23 1317 82     Resp 08/02/23 1317 15     Temp 08/02/23 1317 97.9 F (36.6 C)     Temp Source 08/02/23 1317 Oral     SpO2 08/02/23 1317 97 %     Weight --      Height --      Head Circumference --      Peak Flow --      Pain Score 08/02/23 1315 5     Pain Loc --      Pain Education --      Exclude from Growth Chart --    No data found.  Updated Vital Signs BP (!) 175/103 (BP Location: Right Arm)   Pulse 82   Temp 97.9 F (36.6 C) (Oral)   Resp 15   SpO2 97%   Visual Acuity Right Eye Distance:   Left Eye Distance:   Bilateral Distance:    Right Eye Near:   Left Eye Near:    Bilateral Near:     Physical Exam Vitals reviewed.  Constitutional:      General: She is not in acute distress.    Appearance: She is not ill-appearing, toxic-appearing or diaphoretic.  HENT:     Mouth/Throat:      Mouth: Mucous membranes are moist.  Cardiovascular:     Rate and Rhythm: Normal rate and regular rhythm.  Pulmonary:     Effort: Pulmonary effort is normal.     Breath sounds: Normal breath sounds.  Abdominal:     Palpations: Abdomen is soft.  Tenderness: There is no abdominal tenderness.  Skin:    Coloration: Skin is not pale.     Comments: There is an area of erythema and soft tissue swelling about 4 cm in diameter that overlies the middle portion of her right clavicle.  There are 2 little dots on the superior portion that could be where there was an envenomation  Neurological:     Mental Status: She is alert and oriented to person, place, and time.  Psychiatric:        Behavior: Behavior normal.      UC Treatments / Results  Labs (all labs ordered are listed, but only abnormal results are displayed) Labs Reviewed  URINALYSIS, W/ REFLEX TO CULTURE (INFECTION SUSPECTED) - Abnormal; Notable for the following components:      Result Value   Color, Urine STRAW (*)    Specific Gravity, Urine <1.005 (*)    Bacteria, UA RARE (*)    All other components within normal limits  URINE CULTURE    EKG   Radiology No results found.  Procedures Procedures (including critical care time)  Medications Ordered in UC Medications - No data to display  Initial Impression / Assessment and Plan / UC Course  I have reviewed the triage vital signs and the nursing notes.  Pertinent labs & imaging results that were available during my care of the patient were reviewed by me and considered in my medical decision making (see chart for details).     Urinalysis does not show any leuks or nitrites or red blood cells.  Doxycycline is sent in to cover potential cellulitis and prednisone is sent in for possible insect bite reaction.  Triamcinolone cream sent and applied topically.  Urinalysis has been but I think the doxycycline would cover UTI.  Urine culture is sent and help sort out.  Best  to follow-up with your primary care if the urinary symptoms or not improving  Final Clinical Impressions(s) / UC Diagnoses   Final diagnoses:  Cellulitis of chest wall  Insect bite of right front wall of thorax, initial encounter  Urinary frequency     Discharge Instructions      The urinalysis does not show any white blood cells or red blood cells or nitrites.  This is not consistent with a urinary tract infection at this time.  Urine culture is still sent and if that is positive and you need a different antibiotic from what I have sent already, staff will call you.  That should result within 2 to 3 days.  Take doxycycline 100 mg --1 capsule 2 times daily for 7 days; this is sent in for the swelling in your skin on your shoulder, but also would be a treatment for urinary tract infection if that is still going on.  Take prednisone 20 mg--2 daily for 5 days  Triamcinolone cream--apply 2 times daily to the rash area until better, about 10 to 14 days.  If your urine culture is negative and your urinary symptoms continue, please follow-up with your primary care so they can help you with further evaluation and treatment.     ED Prescriptions     Medication Sig Dispense Auth. Provider   doxycycline (VIBRAMYCIN) 100 MG capsule Take 1 capsule (100 mg total) by mouth 2 (two) times daily for 7 days. 14 capsule Zenia Resides, MD   predniSONE (DELTASONE) 20 MG tablet Take 2 tablets (40 mg total) by mouth daily with breakfast for 5 days. 10 tablet [address]  K, MD   triamcinolone cream (KENALOG) 0.1 % Apply 1 Application topically 2 (two) times daily. To affected area till better 80 g Marlinda Mike Janace Aris, MD      PDMP not reviewed this encounter.   Zenia Resides, MD 08/02/23 1344

## 2023-08-02 NOTE — ED Triage Notes (Signed)
 Patient states that she was bit by something Thursday on her right collar bode. Patient unsure of what it was but knows it stung.  Patient states that she was taking meds for UTI that she finished 2 days ago. Patient states that she is still having having lower abdominal pressure and urinary frequency.

## 2023-08-03 LAB — URINE CULTURE: Culture: <10000 — AB

## 2023-09-15 ENCOUNTER — Ambulatory Visit: Payer: Medicare HMO | Admitting: Dermatology

## 2023-09-23 ENCOUNTER — Encounter (INDEPENDENT_AMBULATORY_CARE_PROVIDER_SITE_OTHER): Payer: Self-pay

## 2023-10-14 ENCOUNTER — Ambulatory Visit: Admitting: Dermatology

## 2023-10-14 ENCOUNTER — Encounter: Payer: Self-pay | Admitting: Dermatology

## 2023-10-14 DIAGNOSIS — D492 Neoplasm of unspecified behavior of bone, soft tissue, and skin: Secondary | ICD-10-CM

## 2023-10-14 DIAGNOSIS — L820 Inflamed seborrheic keratosis: Secondary | ICD-10-CM

## 2023-10-14 DIAGNOSIS — D485 Neoplasm of uncertain behavior of skin: Secondary | ICD-10-CM

## 2023-10-14 DIAGNOSIS — H61002 Unspecified perichondritis of left external ear: Secondary | ICD-10-CM | POA: Diagnosis not present

## 2023-10-14 DIAGNOSIS — L570 Actinic keratosis: Secondary | ICD-10-CM

## 2023-10-14 DIAGNOSIS — L821 Other seborrheic keratosis: Secondary | ICD-10-CM

## 2023-10-14 DIAGNOSIS — L814 Other melanin hyperpigmentation: Secondary | ICD-10-CM

## 2023-10-14 DIAGNOSIS — W908XXA Exposure to other nonionizing radiation, initial encounter: Secondary | ICD-10-CM

## 2023-10-14 DIAGNOSIS — D239 Other benign neoplasm of skin, unspecified: Secondary | ICD-10-CM

## 2023-10-14 DIAGNOSIS — L578 Other skin changes due to chronic exposure to nonionizing radiation: Secondary | ICD-10-CM

## 2023-10-14 DIAGNOSIS — Z85828 Personal history of other malignant neoplasm of skin: Secondary | ICD-10-CM

## 2023-10-14 NOTE — Patient Instructions (Addendum)

## 2023-10-14 NOTE — Progress Notes (Signed)
 Follow-Up Visit   Subjective  [name] is a 73 y.o. female who presents for the following: sore spot at left ear, present for a few weeks. Spot at left forearm, itching, spot inside left upper arm. Bumps at left breast, rough feeling, spot at right hand. Rough spots at right lower leg, right ankle with little white bumps that have been there for years. Patient with hx of BCC and SCC.   The patient has spots, moles and lesions to be evaluated, some may be new or changing and the patient may have concern these could be cancer.   The following portions of the chart were reviewed this encounter and updated as appropriate: medications, allergies, medical history  Review of Systems:  No other skin or systemic complaints except as noted in HPI or Assessment and Plan.  Objective  Well appearing patient in no apparent distress; mood and affect are within normal limits.   A focused examination was performed of the following areas: Left ear,   Relevant exam findings are noted in the Assessment and Plan.  Left Ear Helix 5 mm scaly indurated pink papule  Left Forearm extensor distal 8 mm scaly keratotic papule  chest x 1, R forearm x 1, L forearm x 1 (3) Erythematous thin papules/macules with gritty scale.  Right Lower Leg x 1 Erythematous stuck-on, waxy papule or plaque  Assessment & Plan   LENTIGINES Exam: scattered tan macules Due to sun exposure Treatment Plan: Benign-appearing, observe. Recommend daily broad spectrum sunscreen SPF 30+ to sun-exposed areas, reapply every 2 hours as needed.  Call for any changes  SEBORRHEIC KERATOSIS - Stuck-on, waxy, tan-brown papules and/or plaques  - Benign-appearing - Discussed benign etiology and prognosis. - Observe - Call for any changes  NEOPLASM OF UNCERTAIN BEHAVIOR OF SKIN (2) Left Ear Helix Epidermal / dermal shaving  Lesion diameter (cm):  0.5 Informed consent: discussed and consent obtained   Timeout: patient name,  date of birth, surgical site, and procedure verified   Procedure prep:  Patient was prepped and draped in usual sterile fashion Prep type:  Povidone-iodine and isopropyl alcohol Anesthesia: the lesion was anesthetized in a standard fashion   Anesthetic:  1% lidocaine  w/ epinephrine  1-100,000 buffered w/ 8.4% NaHCO3 Instrument used: DermaBlade   Hemostasis achieved with: pressure and aluminum chloride   Outcome: patient tolerated procedure well   Post-procedure details: wound care instructions given   Specimen 2 - Surgical pathology Differential Diagnosis: chondrodermatitis nodularis helicis vs SCC  Check Margins: No Left Forearm extensor distal Epidermal / dermal shaving  Lesion diameter (cm):  0.8 Informed consent: discussed and consent obtained   Timeout: patient name, date of birth, surgical site, and procedure verified   Procedure prep:  Patient was prepped and draped in usual sterile fashion Prep type:  Povidone-iodine and isopropyl alcohol Anesthesia: the lesion was anesthetized in a standard fashion   Anesthetic:  1% lidocaine  w/ epinephrine  1-100,000 buffered w/ 8.4% NaHCO3 Instrument used: DermaBlade   Hemostasis achieved with: pressure and aluminum chloride   Outcome: patient tolerated procedure well   Post-procedure details: wound care instructions given   Specimen 1 - Surgical pathology Differential Diagnosis: SCC  Check Margins: No Discussed pressure-related etiology of CNH. If CNH confirmed on ear, recommend donut pillow nightly AK (ACTINIC KERATOSIS) (3) chest x 1, R forearm x 1, L forearm x 1 (3) Actinic keratoses are precancerous spots that appear secondary to cumulative UV radiation exposure/sun exposure over time. They are chronic with expected duration  over 1 year. A portion of actinic keratoses will progress to squamous cell carcinoma of the skin. It is not possible to reliably predict which spots will progress to skin cancer and so treatment is recommended to  prevent development of skin cancer.  Recommend daily broad spectrum sunscreen SPF 30+ to sun-exposed areas, reapply every 2 hours as needed.  Recommend staying in the shade or wearing long sleeves, sun glasses (UVA+UVB protection) and wide brim hats (4-inch brim around the entire circumference of the hat). Call for new or changing lesions. Destruction of lesion - chest x 1, R forearm x 1, L forearm x 1 (3) Complexity: simple   Destruction method: cryotherapy   Informed consent: discussed and consent obtained   Timeout:  patient name, date of birth, surgical site, and procedure verified Lesion destroyed using liquid nitrogen: Yes   Region frozen until ice ball extended beyond lesion: Yes   Cryo cycles: 1 or 2. Outcome: patient tolerated procedure well with no complications   Post-procedure details: wound care instructions given   INFLAMED SEBORRHEIC KERATOSIS Right Lower Leg x 1 Symptomatic, irritating, patient would like treated.  Benign-appearing.  Call clinic for new or changing lesions.   Destruction of lesion - Right Lower Leg x 1 Complexity: simple   Destruction method: cryotherapy   Informed consent: discussed and consent obtained   Timeout:  patient name, date of birth, surgical site, and procedure verified Lesion destroyed using liquid nitrogen: Yes   Region frozen until ice ball extended beyond lesion: Yes   Cryo cycles: 1 or 2. Outcome: patient tolerated procedure well with no complications   Post-procedure details: wound care instructions given   SEBORRHEIC KERATOSES   LENTIGINES   ACTINIC ELASTOSIS   DERMATOFIBROMA    DERMATOFIBROMA Exam: Firm pink/brown papulenodule with dimple sign. Treatment Plan: A dermatofibroma is a benign growth possibly related to trauma, such as an insect bite, cut from shaving, or inflamed acne-type bump.  Treatment options to remove include shave or excision with resulting scar and risk of recurrence.  Since benign-appearing and  not bothersome, will observe for now.   Return for as scheduled, with Dr. Annette Barters.  Kerstin Peeling, RMA, am acting as scribe for Harris Liming, MD .   Documentation: I have reviewed the above documentation for accuracy and completeness, and I agree with the above.  Harris Liming, MD

## 2023-10-20 LAB — SURGICAL PATHOLOGY

## 2023-10-21 ENCOUNTER — Ambulatory Visit: Payer: Self-pay | Admitting: Dermatology

## 2023-11-03 ENCOUNTER — Ambulatory Visit: Admitting: Dermatology

## 2023-11-03 DIAGNOSIS — L570 Actinic keratosis: Secondary | ICD-10-CM | POA: Diagnosis not present

## 2023-11-03 DIAGNOSIS — W908XXA Exposure to other nonionizing radiation, initial encounter: Secondary | ICD-10-CM | POA: Diagnosis not present

## 2023-11-03 DIAGNOSIS — Z7189 Other specified counseling: Secondary | ICD-10-CM

## 2023-11-03 DIAGNOSIS — L578 Other skin changes due to chronic exposure to nonionizing radiation: Secondary | ICD-10-CM

## 2023-11-03 DIAGNOSIS — L988 Other specified disorders of the skin and subcutaneous tissue: Secondary | ICD-10-CM

## 2023-11-03 DIAGNOSIS — H61002 Unspecified perichondritis of left external ear: Secondary | ICD-10-CM

## 2023-11-03 DIAGNOSIS — L820 Inflamed seborrheic keratosis: Secondary | ICD-10-CM

## 2023-11-03 NOTE — Progress Notes (Signed)
 Follow-Up Visit   Subjective  [name] is a 73 y.o. 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  Objective  Well appearing patient in no apparent distress; mood and affect are within normal limits.  A focused examination was performed of the face, chest, R leg, L ear  Relevant physical exam findings are noted in the Assessment and Plan.  Before photos            After photos            Injection map photo   chest x 9 Pink scaly macules R medial upper calf x 1 Stuck on waxy paps with erythema  Assessment & Plan   AK (ACTINIC KERATOSIS) chest x 9 Actinic keratoses are precancerous spots that appear secondary to cumulative UV radiation exposure/sun exposure over time. They are chronic with expected duration over 1 year. A portion of actinic keratoses will progress to squamous cell carcinoma of the skin. It is not possible to reliably predict which spots will progress to skin cancer and so treatment is recommended to prevent development of skin cancer.  Recommend daily broad spectrum sunscreen SPF 30+ to sun-exposed areas, reapply every 2 hours as needed.  Recommend staying in the shade or wearing long sleeves, sun glasses (UVA+UVB protection) and wide brim hats (4-inch brim around the entire circumference of the hat). Call for new or changing lesions. Destruction of lesion - chest x 9  Destruction method: cryotherapy   Informed consent: discussed and consent obtained   Lesion destroyed using liquid nitrogen: Yes   Region frozen until ice ball extended beyond lesion: Yes   Outcome:  patient tolerated procedure well with no complications   Post-procedure details: wound care instructions given   Additional details:  Prior to procedure, discussed risks of blister formation, small wound, skin dyspigmentation, or rare scar following cryotherapy. Recommend Vaseline ointment to treated areas while healing.  INFLAMED SEBORRHEIC KERATOSIS R medial upper calf x 1 Vs Porokeratosis Symptomatic, irritating, patient would like treated. Destruction of lesion - R medial upper calf x 1  Destruction method: cryotherapy   Informed consent: discussed and consent obtained   Lesion destroyed using liquid nitrogen: Yes   Region frozen until ice ball extended beyond lesion: Yes   Outcome: patient tolerated procedure well with no complications   Post-procedure details: wound care instructions given   Additional details:  Prior to procedure, discussed risks of blister formation, small wound, skin dyspigmentation, or rare scar following cryotherapy. Recommend Vaseline ointment to treated areas while healing.   ACTINIC DAMAGE WITH PRECANCEROUS ACTINIC KERATOSES Counseling for Topical Chemotherapy Management: Patient exhibits: - Severe, confluent actinic changes with pre-cancerous actinic keratoses that is secondary to cumulative UV radiation exposure over time - Condition that is severe; chronic, not at goal. - diffuse scaly erythematous macules and papules with underlying dyspigmentation - Discussed Prescription Field Treatment topical Chemotherapy for Severe, Chronic Confluent Actinic Changes with Pre-Cancerous Actinic Keratoses Field treatment involves treatment of an entire area of skin that has confluent Actinic Changes (Sun/ Ultraviolet light damage) and PreCancerous Actinic Keratoses by method of PhotoDynamic Therapy (PDT) and/or prescription Topical Chemotherapy agents such as  5-fluorouracil , 5-fluorouracil /calcipotriene , and/or imiquimod.  The purpose is to decrease the number of  clinically evident and subclinical PreCancerous lesions to prevent progression to development of skin cancer by chemically destroying early precancer changes that may or may not be visible.  It has been shown to reduce the risk of developing skin cancer in the treated area. As a result of treatment, redness, scaling, crusting, and open sores may occur during treatment course. One or more than one of these methods may be used and may have to be used several times to control, suppress and eliminate the PreCancerous changes. Discussed treatment course, expected reaction, and possible side effects. - Recommend daily broad spectrum sunscreen SPF 30+ to sun-exposed areas, reapply every 2 hours as needed.  - Staying in the shade or wearing long sleeves, sun glasses (UVA+UVB protection) and wide brim hats (4-inch brim around the entire circumference of the hat) are also recommended. - Call for new or changing lesions.  - Discussed blue light PDT with debridement to chest for AKs   FACIAL ELASTOSIS  Botox 27.5 units injected today to: - Frown complex 27.5 units  Plan 6 units to upper lip on f/u  Location: frown complex  Informed consent: Discussed risks (infection, pain, bleeding, bruising, swelling, allergic reaction, paralysis of nearby muscles, eyelid droop, double vision, neck weakness, difficulty breathing, headache, undesirable cosmetic result, and need for additional treatment) and benefits of the procedure, as well as the alternatives.  Informed consent was obtained.  Preparation: The area was cleansed with alcohol.  Procedure Details:  Botox was injected into the dermis with a 30-gauge needle. Pressure applied to any bleeding. Ice packs offered for swelling.  Lot Number:  [id number] Expiration:  09/2025  Total Units Injected:  27.5  Plan: Tylenol  may be used for headache.  Allow 2 weeks before returning to clinic for additional dosing as needed. Patient will call for any problems.  Prior to  the procedure, the patient's past medical history, allergies and the rare but potential risks and complications were reviewed with the patient and a signed consent was obtained. Pre and post-treatment care was discussed and instructions provided.  Location: corners of mouth, chin, marionette lines  Filler Type: Restylane refyne Lot [id number], exp 02/02/2025  Procedure: The area was prepped thoroughly with Puracyn. After introducing the needle into the desired treatment area, the syringe plunger was drawn back to ensure there was no flash of blood prior to injecting the filler in order to minimize risk of intravascular injection and vascular occlusion. After injection of the filler, the treated areas were cleansed and iced to reduce swelling. Post-treatment instructions were reviewed with the patient.       Patient tolerated the procedure well. The patient will call with any problems, questions or concerns prior to their next appointment.  CHONDRODERMATITIS NODULARIS HELICIS Bx proven,  L ear helix Exam: healing bx site  Chondrodermatitis Nodularis Chronica Helicis (CNCH or CNH) is a common, benign inflammatory condition of the ear cartilage and overlying skin associated with very sensitive tender papule(s).  Trauma or pressure from sleeping on the ear or from cell phone use and sun damage may be exacerbating factors.  Treatment may include using a C-shaped airplane neck pillow for sleeping on the side of the head so no pressure is on the ear.  Other treatments include topical or intralesional steroids; liquid nitrogen or laser destruction; shave removal or excision.  The condition can be difficult to treat and persist or recur despite treatment.  Treatment:  Discussed [place] Kenalog  injections if still painful but recommend waiting ~6 weeks from bx on 10/14/23 or until completely healed Cont woundcare qd   Return in about 6 weeks (around 12/15/2023) for recheck CNCH L ear, plan Botox 6 units to upper  lip on f/u.  I, Grayce Saunas, RMA, am acting as scribe for Rexene Rattler, MD .   Documentation: I have reviewed the above documentation for accuracy and completeness, and I agree with the above.  Rexene Rattler, MD

## 2023-11-03 NOTE — Patient Instructions (Addendum)

## 2023-12-12 ENCOUNTER — Other Ambulatory Visit: Payer: Self-pay | Admitting: Internal Medicine

## 2023-12-12 DIAGNOSIS — R1084 Generalized abdominal pain: Secondary | ICD-10-CM

## 2023-12-15 ENCOUNTER — Ambulatory Visit (INDEPENDENT_AMBULATORY_CARE_PROVIDER_SITE_OTHER): Admitting: Dermatology

## 2023-12-15 ENCOUNTER — Encounter: Payer: Self-pay | Admitting: Dermatology

## 2023-12-15 ENCOUNTER — Ambulatory Visit
Admission: RE | Admit: 2023-12-15 | Discharge: 2023-12-15 | Disposition: A | Discharge status: Home / Self Care | Source: Ambulatory Visit | Attending: Internal Medicine | Admitting: Internal Medicine

## 2023-12-15 DIAGNOSIS — L988 Other specified disorders of the skin and subcutaneous tissue: Secondary | ICD-10-CM

## 2023-12-15 DIAGNOSIS — R1084 Generalized abdominal pain: Secondary | ICD-10-CM | POA: Insufficient documentation

## 2023-12-15 DIAGNOSIS — H61002 Unspecified perichondritis of left external ear: Secondary | ICD-10-CM | POA: Diagnosis not present

## 2023-12-15 DIAGNOSIS — L989 Disorder of the skin and subcutaneous tissue, unspecified: Secondary | ICD-10-CM

## 2023-12-15 MED ORDER — TRIAMCINOLONE ACETONIDE 10 MG/ML IJ SUSP
5.0000 mg | Freq: Once | INTRAMUSCULAR | Status: AC
  Administered 2023-12-15 (×2): 5 mg

## 2023-12-15 NOTE — Patient Instructions (Signed)

## 2023-12-15 NOTE — Progress Notes (Signed)
   Follow-Up Visit   Subjective  [name] is a 73 y.o. 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  Objective  Well appearing patient in no apparent distress; mood and affect are within normal limits.   A focused examination was performed of the following areas: face  Relevant exam findings are noted in the Assessment and Plan.  Before photos       Botox injection map  L mid upper ear helix 5.12mm pink firm pap with central erosion L mid upper ear helix  Assessment & Plan   FACIAL ELASTOSIS Exam: Rhytides and volume loss.  Treatment Plan: Facial Elastosis Botox 6 units injected today to: - upper lip 6 units Location: upper cutaneous lip  Informed consent: Discussed risks (infection, pain, bleeding, bruising, swelling, allergic reaction, paralysis of nearby muscles, eyelid droop, double vision, neck weakness, difficulty breathing, headache, undesirable cosmetic result, and need for additional treatment) and benefits of the procedure, as well as the alternatives.  Informed consent was obtained.  Preparation: The area was cleansed with alcohol.  Procedure Details:  Botox was injected into the dermis with a 30-gauge needle. Pressure applied to any bleeding. Ice packs offered for swelling.  Lot Number:  [id number] Expiration:  09/2025  Total Units Injected:  6  Plan: Tylenol  may be used for headache.  Allow 2 weeks before returning to clinic for additional dosing as needed. Patient will call for any problems.   Recommend daily broad spectrum sunscreen SPF 30+ to sun-exposed areas, reapply every 2 hours as needed. Call for new or  changing lesions.  Staying in the shade or wearing long sleeves, sun glasses (UVA+UVB protection) and wide brim hats (4-inch brim around the entire circumference of the hat) are also recommended for sun protection.    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  Avoid sleeping on left side  Intralesional injection - L mid upper ear helix Location: L ear helix  Informed Consent: Discussed risks (infection, pain, bleeding, bruising, thinning of the skin, loss of skin pigment, lack of resolution, and recurrence of lesion) and benefits of the procedure, as well as the alternatives. Informed consent was obtained. Preparation: The area was prepared a standard fashion.  Procedure Details: An intralesional injection was performed with Kenalog  5 mg/cc. 0.1 cc in total were injected.  Total number of injections: 1  Plan: The patient was instructed on post-op care. Recommend OTC analgesia as needed for pain.  Kit [id number], exp 10/2025  Related Medications triamcinolone  acetonide (KENALOG ) 10 MG/ML injection 5 mg  ELASTOSIS OF SKIN   PAINFUL SKIN LESION    Return in about 6 weeks (around 01/26/2024) for recheck ear and possible botox to upper lip.  I, Grayce Saunas, RMA, am acting as scribe for Rexene Rattler, MD .   Documentation: I have reviewed the above documentation for accuracy and completeness, and I agree with the above.  Rexene Rattler, MD

## 2023-12-16 ENCOUNTER — Encounter: Payer: Self-pay | Admitting: Internal Medicine

## 2023-12-16 DIAGNOSIS — R1084 Generalized abdominal pain: Secondary | ICD-10-CM

## 2024-01-26 ENCOUNTER — Ambulatory Visit: Admitting: Dermatology

## 2024-01-29 ENCOUNTER — Ambulatory Visit: Admitting: Dermatology

## 2024-01-29 DIAGNOSIS — L989 Disorder of the skin and subcutaneous tissue, unspecified: Secondary | ICD-10-CM

## 2024-01-29 DIAGNOSIS — H61002 Unspecified perichondritis of left external ear: Secondary | ICD-10-CM

## 2024-01-29 DIAGNOSIS — L988 Other specified disorders of the skin and subcutaneous tissue: Secondary | ICD-10-CM

## 2024-01-29 DIAGNOSIS — S80812A Abrasion, left lower leg, initial encounter: Secondary | ICD-10-CM | POA: Diagnosis not present

## 2024-01-29 DIAGNOSIS — T148XXA Other injury of unspecified body region, initial encounter: Secondary | ICD-10-CM

## 2024-01-29 MED ORDER — TRIAMCINOLONE ACETONIDE 10 MG/ML IJ SUSP
5.0000 mg | Freq: Once | INTRAMUSCULAR | Status: AC
  Administered 2024-01-29: 5 mg

## 2024-01-29 MED ORDER — MUPIROCIN 2 % EX OINT: 1.0000 | TOPICAL_OINTMENT | Freq: Two times a day (BID) | CUTANEOUS | 0 refills | Status: AC

## 2024-01-29 NOTE — Progress Notes (Signed)
 Follow-Up Visit   Subjective  [name] is a 73 y.o. female who presents for the following: Wound on the lower leg since 12/31/23 from metal on dog leash hitting leg, pt would like it checked today. It did have a yellowish discharge, but seems to have improved. [facility] L ear, ILK injection did help, but L ear is still inflamed. Botox worked well, but she would like to add more.   The following portions of the chart were reviewed this encounter and updated as appropriate: medications, allergies, medical history  Review of Systems:  No other skin or systemic complaints except as noted in HPI or Assessment and Plan.  Objective  Well appearing patient in no apparent distress; mood and affect are within normal limits.  A focused examination was performed of the following areas: the face and ears   Relevant exam findings are noted in the Assessment and Plan.  L mid upper ear helix 5.61mm pink firm pap with central erosion L mid upper ear helix Face Rhytides and volume loss.    Assessment & Plan   Healing abrasion on the L lower leg -  Erosion, no evidence of infection L lower pretibia  Continue wound care, start Mupirocin  2% ointment and cover with bandage daily until healed. 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  Avoid sleeping on left side  Intralesional injection - L mid upper ear helix Location: L ear helix  Informed Consent: Discussed risks (infection, pain, bleeding, bruising, thinning of the skin, loss of skin pigment, lack of resolution, and recurrence of lesion) and benefits of the procedure, as well as the alternatives. Informed consent was obtained. Preparation: The area was prepared a standard fashion.  Procedure Details: An intralesional  injection was performed with Kenalog  5 mg/cc. 0.1 cc in total were injected.  Total number of injections: 2  Plan: The patient was instructed on post-op care. Recommend OTC analgesia as needed for pain.   Related Medications triamcinolone  acetonide (KENALOG ) 10 MG/ML injection 5 mg  ELASTOSIS OF SKIN Face Discussed with patient that Botox around the lips doesn't last as long as Botox injected into other parts of the face.   Botox 6 units injected into the upper lip- lip flip. Botox Injection - Face Location: See attached image  Informed consent: Discussed risks (infection, pain, bleeding, bruising, swelling, allergic reaction, paralysis of nearby muscles, eyelid droop, double vision, neck weakness, difficulty breathing, headache, undesirable cosmetic result, and need for additional treatment) and benefits of the procedure, as well as the alternatives.  Informed consent was obtained.  Preparation: The area was cleansed with alcohol.  Procedure Details:  Botox was injected into the dermis with a 30-gauge needle. Pressure applied to any bleeding. Ice packs offered for swelling.  Lot Number:  [id number] Expiration:  02/2026  Total Units Injected:  6  Plan: Patient was instructed to remain upright for 4 hours. Patient was instructed to avoid massaging the face and avoid vigorous exercise for the rest of the day. Tylenol  may be used for headache.  Allow 2 weeks before returning to clinic for additional dosing as needed. Patient will call for any problems.    Return for Botox on the upper lip in 6-8 weeks.  LILLETTE Rosina Mayans, CMA, am acting as scribe for Rexene Rattler, MD .   Documentation: I  have reviewed the above documentation for accuracy and completeness, and I agree with the above.  Rexene Rattler, MD

## 2024-01-29 NOTE — Patient Instructions (Signed)

## 2024-02-12 ENCOUNTER — Encounter: Payer: Self-pay | Admitting: Gastroenterology

## 2024-02-13 ENCOUNTER — Ambulatory Visit: Admitting: Anesthesiology

## 2024-02-13 ENCOUNTER — Encounter
Admission: RE | Disposition: A | Discharge status: Home / Self Care | Payer: Self-pay | Source: Home / Self Care | Attending: Gastroenterology

## 2024-02-13 ENCOUNTER — Other Ambulatory Visit: Payer: Self-pay

## 2024-02-13 ENCOUNTER — Encounter: Payer: Self-pay | Admitting: Gastroenterology

## 2024-02-13 ENCOUNTER — Ambulatory Visit
Admission: RE | Admit: 2024-02-13 | Discharge: 2024-02-13 | Disposition: A | Discharge status: Home / Self Care | Attending: Gastroenterology | Admitting: Gastroenterology

## 2024-02-13 DIAGNOSIS — R109 Unspecified abdominal pain: Secondary | ICD-10-CM | POA: Diagnosis present

## 2024-02-13 DIAGNOSIS — K5730 Diverticulosis of large intestine without perforation or abscess without bleeding: Secondary | ICD-10-CM | POA: Insufficient documentation

## 2024-02-13 DIAGNOSIS — E039 Hypothyroidism, unspecified: Secondary | ICD-10-CM | POA: Diagnosis not present

## 2024-02-13 DIAGNOSIS — K635 Polyp of colon: Secondary | ICD-10-CM | POA: Insufficient documentation

## 2024-02-13 HISTORY — PX: POLYPECTOMY: SHX149

## 2024-02-13 HISTORY — PX: COLONOSCOPY: SHX5424

## 2024-02-13 HISTORY — DX: Complete rotator cuff tear or rupture of left shoulder, not specified as traumatic: M75.122

## 2024-02-13 HISTORY — DX: Other specified disorders of bone density and structure, multiple sites: M85.89

## 2024-02-13 SURGERY — COLONOSCOPY
Anesthesia: General

## 2024-02-13 MED ORDER — PROPOFOL 1000 MG/100ML IV EMUL
INTRAVENOUS | Status: AC
  Filled 2024-02-13: qty 200

## 2024-02-13 MED ORDER — DEXMEDETOMIDINE HCL IN NACL 80 MCG/20ML IV SOLN
INTRAVENOUS | Status: DC | PRN
  Administered 2024-02-13: 12 ug via INTRAVENOUS
  Administered 2024-02-13: 8 ug via INTRAVENOUS

## 2024-02-13 MED ORDER — LIDOCAINE HCL (PF) 2 % IJ SOLN
INTRAMUSCULAR | Status: AC
  Filled 2024-02-13: qty 5

## 2024-02-13 MED ORDER — PROPOFOL 10 MG/ML IV BOLUS
INTRAVENOUS | Status: DC | PRN
  Administered 2024-02-13 (×2): 50 mg via INTRAVENOUS

## 2024-02-13 MED ORDER — LIDOCAINE HCL (CARDIAC) PF 100 MG/5ML IV SOSY
PREFILLED_SYRINGE | INTRAVENOUS | Status: DC | PRN
  Administered 2024-02-13: 60 mg via INTRAVENOUS

## 2024-02-13 MED ORDER — SIMETHICONE 40 MG/0.6ML PO SUSP
ORAL | Status: DC | PRN
  Administered 2024-02-13: 60 mL

## 2024-02-13 MED ORDER — SODIUM CHLORIDE 0.9 % IV SOLN: INTRAVENOUS | Status: DC

## 2024-02-13 MED ORDER — PROPOFOL 500 MG/50ML IV EMUL
INTRAVENOUS | Status: DC | PRN
  Administered 2024-02-13: 75 ug/kg/min via INTRAVENOUS

## 2024-02-13 NOTE — Anesthesia Postprocedure Evaluation (Signed)
 Anesthesia Post Note  Patient: [name]  Procedure(s) Performed: COLONOSCOPY POLYPECTOMY, INTESTINE  Patient location during evaluation: PACU Anesthesia Type: General Level of consciousness: awake and alert Pain management: pain level controlled Vital Signs Assessment: post-procedure vital signs reviewed and stable Respiratory status: spontaneous breathing, nonlabored ventilation and respiratory function stable Cardiovascular status: blood pressure returned to baseline and stable Postop Assessment: no apparent nausea or vomiting Anesthetic complications: no   No notable events documented.   Last Vitals:  Vitals:   02/13/24 0815 02/13/24 0825  BP: (!) 99/57 94/62  Pulse: 70 63  Resp: 16 13  Temp:    SpO2: 100% 100%    Last Pain:  Vitals:   02/13/24 0825  TempSrc:   PainSc: 0-No pain                 Camellia Merilee Louder

## 2024-02-13 NOTE — Transfer of Care (Signed)
 Immediate Anesthesia Transfer of Care Note  Patient: [name]  Procedure(s) Performed: COLONOSCOPY POLYPECTOMY, INTESTINE  Patient Location: PACU  Anesthesia Type:General  Level of Consciousness: sedated  Airway & Oxygen Therapy: Patient Spontanous Breathing  Post-op Assessment: Report given to RN and Post -op Vital signs reviewed and stable  Post vital signs: Reviewed and stable  Last Vitals:  Vitals Value Taken Time  BP 99/57 02/13/24 08:15  Temp    Pulse 71 02/13/24 08:15  Resp 15 02/13/24 08:15  SpO2 100 % 02/13/24 08:15  Vitals shown include unfiled device data.  Last Pain:  Vitals:   02/13/24 0815  TempSrc:   PainSc: Asleep         Complications: No notable events documented.

## 2024-02-13 NOTE — H&P (Signed)
 [name] , MD [address], [address], [place], [place], [address] Phone: [phone] Fax: [phone]  Primary Care Physician:  Meredith Ophelia JINNY DOUGLAS, MD   Pre-Procedure History & Physical: HPI:  [name] is a 73 y.o. female is here for an colonoscopy.   Past Medical History:  Diagnosis Date   Arthritis    Basal cell carcinoma 12/08/2008   right sup medial breast   Basal cell carcinoma 06/10/2017   left lat nasal bridge inf to medial canthus   Basal cell carcinoma 03/02/2019   right distal med calf   CRVO (central retinal vein occlusion) (HCC)    History of dysplastic nevus 05/02/2010   right sup pubic/mild   Motion sickness    boats   Nontraumatic complete tear of left rotator cuff    Osteopenia of multiple sites    SCC (squamous cell carcinoma) 04/28/2023   right medial upper ankle scc trx with ED&C   Vertigo 2020   No issues since Rehab    Past Surgical History:  Procedure Laterality Date   ABDOMINAL HYSTERECTOMY     BROW LIFT Bilateral 05/24/2022   Procedure: BLEPHAROPLASTY UPPER EYELID; W/EXCESS SKIN BLEPHAROPTOSIS REPAIR; RESECT EX BILATERAL;  Surgeon: Ashley Greig HERO, MD;  Location: [facility];  Service: Ophthalmology;  Laterality: Bilateral;  needs to be first   COLONOSCOPY     several times   COLONOSCOPY WITH PROPOFOL  N/A 04/13/2021   Procedure: COLONOSCOPY WITH PROPOFOL ;  Surgeon: Janalyn Keene NOVAK, MD;  Location: [facility] END[facility];  Service: Gastroenterology;  Laterality: N/A;   ESOPHAGOGASTRODUODENOSCOPY     EYE SURGERY     KNEE SURGERY     TOTAL VAGINAL HYSTERECTOMY     Tummy Tuck      Prior to Admission medications   Medication Sig Start Date End Date Taking? Authorizing Provider  cetirizine (ZYRTEC) 10 MG chewable tablet Chew 10 mg by mouth daily.   Yes [provider]  cholecalciferol  (VITAMIN D ) 1000  units tablet Take 1,000 Units by mouth daily.   Yes [provider]  fluticasone  (FLONASE ) 50 MCG/ACT nasal spray USE TWO SPRAYS IN EACH NOSTRIL DAILY 06/02/20 02/13/24 Yes Herminio Miu, MD  levothyroxine  (SYNTHROID ) 50 MCG tablet TAKE 1 TABLET BY MOUTH ONCE DAILY ON AN EMPTY STOMACH WITH A GLASS OF WATER AT LEAST 30-60 MINUTES BEFORE BREAKFAST. 03/07/21  Yes   Multiple Vitamin (MULTI-VITAMINS) TABS Take by mouth.   Yes [provider]  timolol  (TIMOPTIC ) 0.5 % ophthalmic solution 1 drop 2 (two) times daily.   Yes [provider]  mupirocin  ointment (BACTROBAN ) 2 % Apply 1 Application topically 2 (two) times daily. 01/29/24   Jackquline Sawyer, MD    Allergies as of 01/08/2024 - Review Complete 12/15/2023  Allergen Reaction Noted   Gadolinium derivatives Hives and Other (See Comments) 08/27/2013   Cephalexin  Itching 03/28/2023   Iodinated contrast media Hives 02/09/2015   Levaquin  [levofloxacin ] Swelling 05/15/2022   Bacitracin Rash 12/18/2021   Neosporin [bacitracin-polymyxin b] Rash 12/18/2021    Family History  Problem Relation Age of Onset   Lung cancer Father    Breast cancer Neg Hx     Social History   Socioeconomic History   Marital status: Married    Spouse name: Not on file   Number of children: Not on file   Years of education: Not on file   Highest education level: Not on file  Occupational History   Not on file  Tobacco Use  Smoking status: Never   Smokeless tobacco: Never  Vaping Use   Vaping status: Never Used  Substance and Sexual Activity   Alcohol use: No    Alcohol/week: 0.0 standard drinks of alcohol   Drug use: Not Currently   Sexual activity: Not on file  Other Topics Concern   Not on file  Social History Narrative   Not on file   Social Drivers of Health   Financial Resource Strain: Low Risk  (04/11/2023)   Received from [facility]   Overall Financial Resource Strain (CARDIA)    Difficulty of Paying  Living Expenses: Not hard at all  Food Insecurity: No Food Insecurity (04/11/2023)   Received from [facility]   Hunger Vital Sign    Within the past 12 months, you worried that your food would run out before you got the money to buy more.: Never true    Within the past 12 months, the food you bought just didn't last and you didn't have money to get more.: Never true  Transportation Needs: No Transportation Needs (04/11/2023)   Received from [facility] - Transportation    In the past 12 months, has lack of transportation kept you from medical appointments or from getting medications?: No    Lack of Transportation (Non-Medical): No  Physical Activity: Not on file  Stress: Not on file  Social Connections: Not on file  Intimate Partner Violence: Not on file    Review of Systems: See HPI, otherwise negative ROS  Physical Exam: BP 139/79   Pulse 68   Temp (!) 96.5 F (35.8 C) (Temporal)   Resp 18   Ht 5' 2 (1.575 m)   Wt 58.5 kg   SpO2 100%   BMI 23.59 kg/m  General:   Alert,  pleasant and cooperative in NAD Head:  Normocephalic and atraumatic. Neck:  Supple; no masses or thyromegaly. Lungs:  Clear throughout to auscultation, normal respiratory effort.    Heart:  +S1, +S2, Regular rate and rhythm, No edema. Abdomen:  Soft, nontender and nondistended. Normal bowel sounds, without guarding, and without rebound.   Neurologic:  Alert and  oriented x4;  grossly normal neurologically.  Impression/Plan: [name] is here for an colonoscopy to be performed for abdominal pain Risks, benefits, limitations, and alternatives regarding  colonoscopy have been reviewed with the patient.  Questions have been answered.  All parties agreeable.   Meredith Kung, MD  02/13/2024, 7:48 AM

## 2024-02-13 NOTE — Anesthesia Preprocedure Evaluation (Addendum)
 Anesthesia Evaluation  Patient identified by MRN, date of birth, ID band Patient awake    Reviewed: Allergy  & Precautions, H&P , NPO status , Patient's Chart, lab work & pertinent test results  Airway Mallampati: II  TM Distance: >3 FB Neck ROM: full    Dental no notable dental hx.    Pulmonary neg pulmonary ROS   Pulmonary exam normal        Cardiovascular negative cardio ROS Normal cardiovascular exam     Neuro/Psych negative neurological ROS  negative psych ROS   GI/Hepatic negative GI ROS, Neg liver ROS,,,  Endo/Other  Hypothyroidism    Renal/GU negative Renal ROS  negative genitourinary   Musculoskeletal   Abdominal Normal abdominal exam  (+)   Peds  Hematology negative hematology ROS (+)   Anesthesia Other Findings Past Medical History: No date: Arthritis 12/08/2008: Basal cell carcinoma     Comment:  right sup medial breast 06/10/2017: Basal cell carcinoma     Comment:  left lat nasal bridge inf to medial canthus 03/02/2019: Basal cell carcinoma     Comment:  right distal med calf No date: CRVO (central retinal vein occlusion) (HCC) 05/02/2010: History of dysplastic nevus     Comment:  right sup pubic/mild No date: Motion sickness     Comment:  boats No date: Nontraumatic complete tear of left rotator cuff No date: Osteopenia of multiple sites 04/28/2023: SCC (squamous cell carcinoma)     Comment:  right medial upper ankle scc trx with ED&C 2020: Vertigo     Comment:  No issues since Rehab  Past Surgical History: No date: ABDOMINAL HYSTERECTOMY 05/24/2022: BROW LIFT; Bilateral     Comment:  Procedure: BLEPHAROPLASTY UPPER EYELID; W/EXCESS SKIN               BLEPHAROPTOSIS REPAIR; RESECT EX BILATERAL;  Surgeon:               Ashley Greig HERO, MD;  Location: [facility];                Service: Ophthalmology;  Laterality: Bilateral;  needs to              be first No date: COLONOSCOPY      Comment:  several times 04/13/2021: COLONOSCOPY WITH PROPOFOL ; N/A     Comment:  Procedure: COLONOSCOPY WITH PROPOFOL ;  Surgeon:               Janalyn Keene NOVAK, MD;  Location: [facility];                Service: Gastroenterology;  Laterality: N/A; No date: ESOPHAGOGASTRODUODENOSCOPY No date: EYE SURGERY No date: KNEE SURGERY No date: TOTAL VAGINAL HYSTERECTOMY No date: Tummy Tuck     Reproductive/Obstetrics negative OB ROS                              Anesthesia Physical Anesthesia Plan  ASA: 2  Anesthesia Plan: General   Post-op Pain Management: Minimal or no pain anticipated   Induction: Intravenous  PONV Risk Score and Plan: Propofol  infusion and TIVA  Airway Management Planned: Natural Airway  Additional Equipment:   Intra-op Plan:   Post-operative Plan:   Informed Consent: I have reviewed the patients History and Physical, chart, labs and discussed the procedure including the risks, benefits and alternatives for the proposed anesthesia with the patient or authorized representative who has indicated his/her understanding and acceptance.  Dental Advisory Given  Plan Discussed with: CRNA and Surgeon  Anesthesia Plan Comments:          Anesthesia Quick Evaluation

## 2024-02-13 NOTE — Op Note (Signed)
 [facility] Gastroenterology Patient Name: [name] Procedure Date: 02/13/2024 7:18 AM MRN: [id number] Account #: [id number] Date of Birth: 1950-09-16 Admit Type: Outpatient Age: 73 Room: [facility] ENDO ROOM 4 Gender: Female Note Status: Finalized Instrument Name: Colon Scope [id number] Procedure:             Colonoscopy Indications:           Abdominal pain Providers:             Ruel Kung MD, MD Referring MD:          Ophelia Sage, MD (Referring MD) Medicines:             Monitored Anesthesia Care Complications:         No immediate complications. Procedure:             Pre-Anesthesia Assessment:                        - Prior to the procedure, a History and Physical was                         performed, and patient medications, allergies and                         sensitivities were reviewed. The patient's tolerance                         of previous anesthesia was reviewed.                        - ASA Grade Assessment: II - A patient with mild                         systemic disease.                        After obtaining informed consent, the colonoscope was                         passed under direct vision. Throughout the procedure,                         the patient's blood pressure, pulse, and oxygen                         saturations were monitored continuously. The                         Colonoscope was introduced through the anus and                         advanced to the the cecum, identified by the                         appendiceal orifice. The colonoscopy was performed                         with ease. The patient tolerated the procedure well.                         The quality of  the bowel preparation was excellent.                         The ileocecal valve, appendiceal orifice, and rectum                         were photographed. Findings:      A 3 mm polyp was found in the cecum. The polyp was sessile. The polyp       was removed  with a jumbo cold forceps. Resection and retrieval were       complete.      Multiple small-mouthed diverticula were found in the sigmoid colon.      The exam was otherwise without abnormality on direct and retroflexion       views. Impression:            - One 3 mm polyp in the cecum, removed with a jumbo                         cold forceps. Resected and retrieved.                        - Diverticulosis in the sigmoid colon.                        - The examination was otherwise normal on direct and                         retroflexion views. Recommendation:        - Discharge patient to home (with escort).                        - Resume previous diet.                        - Continue present medications.                        - Await pathology results.                        - Repeat colonoscopy is not recommended due to current                         age (75 years or older) for surveillance.                        - Return to GI office as previously scheduled. Procedure Code(s):     --- Professional ---                        [phone], Colonoscopy, flexible; with biopsy, single or                         multiple Diagnosis Code(s):     --- Professional ---                        D12.0, Benign neoplasm of cecum                        R10.9, Unspecified  abdominal pain                        K57.30, Diverticulosis of large intestine without                         perforation or abscess without bleeding CPT copyright 2022 [employer] Association. All rights reserved. The codes documented in this report are preliminary and upon coder review may  be revised to meet current compliance requirements. Ruel Kung, MD Ruel Kung MD, MD 02/13/2024 8:13:50 AM This report has been signed electronically. Number of Addenda: 0 Note Initiated On: 02/13/2024 7:18 AM Scope Withdrawal Time: 0 hours 11 minutes 27 seconds  Total Procedure Duration: 0 hours 16 minutes 23 seconds  Estimated  Blood Loss:  Estimated blood loss: none.      [facility]

## 2024-02-17 LAB — SURGICAL PATHOLOGY

## 2024-03-08 ENCOUNTER — Ambulatory Visit (INDEPENDENT_AMBULATORY_CARE_PROVIDER_SITE_OTHER): Payer: Self-pay | Admitting: Dermatology

## 2024-03-08 DIAGNOSIS — H61002 Unspecified perichondritis of left external ear: Secondary | ICD-10-CM | POA: Diagnosis not present

## 2024-03-08 DIAGNOSIS — L988 Other specified disorders of the skin and subcutaneous tissue: Secondary | ICD-10-CM

## 2024-03-08 NOTE — Patient Instructions (Signed)

## 2024-03-08 NOTE — Progress Notes (Signed)
   Follow-Up Visit   Subjective  [name] is a 73 y.o. female who presents for the following: Botox for facial elastosis, lip flip.   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 Improved with ILK injection, but still bothersome.  The following portions of the chart were reviewed this encounter and updated as appropriate: medications, allergies, medical history  Review of Systems:  No other skin or systemic complaints except as noted in HPI or Assessment and Plan.  Objective  Well appearing patient in no apparent distress; mood and affect are within normal limits.  A focused examination was performed of the face.  Relevant physical exam findings are noted in the Assessment and Plan.  Injection map photo   Left mid upper ear helix Pink firm keratotic papule.   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 Has improved with ILK injections, but still bothersome to patient.  Chondrodermatitis Nodularis Chronica Helicis (CNCH or CNH) is a common, benign inflammatory condition of the ear cartilage and overlying skin associated with very sensitive tender papule(s).  Trauma or pressure from sleeping on the ear or from cell phone use and sun damage may be exacerbating factors.  Treatment may include using a C-shaped airplane neck pillow for sleeping on the side of the head so no pressure is on the ear.  Other treatments include topical or intralesional steroids; liquid nitrogen or laser destruction; shave removal or excision.  The condition can be difficult to treat and persist or recur despite treatment.  Destruction of lesion - Left mid upper ear helix  Destruction method: cryotherapy   Informed consent: discussed and consent obtained   Lesion destroyed using liquid nitrogen: Yes   Region frozen until ice ball extended beyond lesion: Yes   Outcome: patient tolerated procedure well with no complications   Post-procedure  details: wound care instructions given   Additional details:  Prior to procedure, discussed risks of blister formation, small wound, skin dyspigmentation, or rare scar following cryotherapy. Recommend Vaseline ointment to treated areas while healing.     Facial Elastosis  Location: See attached image  Informed consent: Discussed risks (infection, pain, bleeding, bruising, swelling, allergic reaction, paralysis of nearby muscles, eyelid droop, double vision, neck weakness, difficulty breathing, headache, undesirable cosmetic result, and need for additional treatment) and benefits of the procedure, as well as the alternatives.  Informed consent was obtained.  Preparation: The area was cleansed with alcohol.  Procedure Details:  Botox was injected into the dermis with a 30-gauge needle. Pressure applied to any bleeding. Ice packs offered for swelling.  Lot Number:  [id number] Expiration:  02/2026  Total Units Injected:  6 units  Plan: Tylenol  may be used for headache.  Allow 2 weeks before returning to clinic for additional dosing as needed. Patient will call for any problems.  Return in 5 weeks (on 04/12/2024) for Botox lip flip.  IAndrea Kerns, CMA, am acting as scribe for Rexene Rattler, MD .   Documentation: I have reviewed the above documentation for accuracy and completeness, and I agree with the above.  Rexene Rattler, MD

## 2024-03-18 ENCOUNTER — Encounter (INDEPENDENT_AMBULATORY_CARE_PROVIDER_SITE_OTHER): Payer: Self-pay

## 2024-04-19 ENCOUNTER — Ambulatory Visit: Payer: Self-pay | Admitting: Dermatology

## 2024-04-19 DIAGNOSIS — L988 Other specified disorders of the skin and subcutaneous tissue: Secondary | ICD-10-CM

## 2024-04-19 NOTE — Patient Instructions (Signed)

## 2024-04-19 NOTE — Progress Notes (Signed)
° °  Follow-Up Visit   Subjective  [name] is a 73 y.o. female who presents for the following: Botox for facial elastosis, lip flip.  The following portions of the chart were reviewed this encounter and updated as appropriate: medications, allergies, medical history  Review of Systems:  No other skin or systemic complaints except as noted in HPI or Assessment and Plan.  Objective  Well appearing patient in no apparent distress; mood and affect are within normal limits.  A focused examination was performed of the face.  Relevant physical exam findings are noted in the Assessment and Plan.  Injection map photo     Assessment & Plan    Facial Elastosis  Location: See attached image  Informed consent: Discussed risks (infection, pain, bleeding, bruising, swelling, allergic reaction, paralysis of nearby muscles, eyelid droop, double vision, neck weakness, difficulty breathing, headache, undesirable cosmetic result, and need for additional treatment) and benefits of the procedure, as well as the alternatives.  Informed consent was obtained.  Preparation: The area was cleansed with alcohol.  Procedure Details:  Botox was injected into the dermis with a 30-gauge needle. Pressure applied to any bleeding. Ice packs offered for swelling.  Lot Number:  [id number] Expiration:  03/2026  Total Units Injected:  6 units  Plan: Tylenol  may be used for headache.  Allow 2 weeks before returning to clinic for additional dosing as needed. Patient will call for any problems.  Return as scheduled for filler - Restylane refyne.  IAndrea Kerns, CMA, am acting as scribe for Rexene Rattler, MD .   Documentation: I have reviewed the above documentation for accuracy and completeness, and I agree with the above.  Rexene Rattler, MD

## 2024-04-22 ENCOUNTER — Other Ambulatory Visit: Payer: Self-pay

## 2024-04-22 ENCOUNTER — Emergency Department: Payer: Worker's Compensation

## 2024-04-22 ENCOUNTER — Emergency Department
Admission: EM | Admit: 2024-04-22 | Discharge: 2024-04-22 | Disposition: A | Discharge status: Home / Self Care | Payer: Worker's Compensation | Source: Home / Self Care | Attending: Emergency Medicine | Admitting: Emergency Medicine

## 2024-04-22 ENCOUNTER — Emergency Department

## 2024-04-22 DIAGNOSIS — M25562 Pain in left knee: Secondary | ICD-10-CM | POA: Diagnosis not present

## 2024-04-22 DIAGNOSIS — Y990 Civilian activity done for income or pay: Secondary | ICD-10-CM | POA: Insufficient documentation

## 2024-04-22 DIAGNOSIS — W19XXXA Unspecified fall, initial encounter: Secondary | ICD-10-CM | POA: Diagnosis not present

## 2024-04-22 DIAGNOSIS — S63502A Unspecified sprain of left wrist, initial encounter: Secondary | ICD-10-CM | POA: Diagnosis not present

## 2024-04-22 DIAGNOSIS — M25561 Pain in right knee: Secondary | ICD-10-CM | POA: Diagnosis not present

## 2024-04-22 DIAGNOSIS — M25512 Pain in left shoulder: Secondary | ICD-10-CM | POA: Diagnosis not present

## 2024-04-22 DIAGNOSIS — M25532 Pain in left wrist: Secondary | ICD-10-CM | POA: Diagnosis present

## 2024-04-22 DIAGNOSIS — T07XXXA Unspecified multiple injuries, initial encounter: Secondary | ICD-10-CM

## 2024-04-22 DIAGNOSIS — E039 Hypothyroidism, unspecified: Secondary | ICD-10-CM | POA: Diagnosis not present

## 2024-04-22 MED ORDER — IBUPROFEN 600 MG PO TABS
600.0000 mg | ORAL_TABLET | Freq: Once | ORAL | Status: AC
  Administered 2024-04-22: 11:00:00 600 mg via ORAL
  Filled 2024-04-22: qty 1

## 2024-04-22 NOTE — Discharge Instructions (Addendum)
 Follow-up with your primary care provider if any continued problems or concerns.  Ibuprofen  as needed for pain.  Ice and elevation as needed for discomfort and wear the wrist brace until you are able to go without it and without pain.

## 2024-04-22 NOTE — ED Triage Notes (Addendum)
 Pt comes with c/o fall while at work at the [employer] via EMS. Pt tripped and fell. Pt states left wrist and right knee pain. Maybe even left knee pain. No loc or thinners and didn't hit her head. VSS ;per ems

## 2024-04-22 NOTE — ED Provider Notes (Signed)
 [facility] Provider Note    Event Date/Time   First MD Initiated Contact with Patient 04/22/24 [phone]     (approximate)   History   Fall   HPI  [name] is a 73 y.o. female   presents to the ED after mechanical fall that occurred at work today.  Patient states that she did not hit her head and no loss of consciousness.  Patient reports left wrist pain and shoulder pain.  She also complains of bilateral knee pain.  Patient has history of hypothyroidism, osteopenia, osteoarthritis, osteoporosis, and rotator cuff tear.    Physical Exam   Triage Vital Signs: ED Triage Vitals [04/22/24 0911]  Encounter Vitals Group     BP      Girls Systolic BP Percentile      Girls Diastolic BP Percentile      Boys Systolic BP Percentile      Boys Diastolic BP Percentile      Pulse      Resp      Temp      Temp src      SpO2      Weight      Height      Head Circumference      Peak Flow      Pain Score 6     Pain Loc      Pain Education      Exclude from Growth Chart     Most recent vital signs: Vitals:   04/22/24 1016 04/22/24 1205  BP: (!) 152/94 (!) 199/85  Pulse: 70 71  Resp: 17 17  Temp: (!) 97.5 F (36.4 C)   SpO2: 100% 100%     General: Awake, no distress.  Alert, talkative, cooperative.  Able to answer questions in complete sentences. CV:  Good peripheral perfusion.  Resp:  Normal effort.  Abd:  No distention.  Other:  Examination of the left wrist there is no gross deformity however range of motion is slow and guarded secondary to discomfort.  Radial pulses present.  Anterior bilateral knees are tender to palpation but no gross deformity noted.  Left shoulder anterior aspect with moderate tenderness palpation.  No deformity or crepitus is appreciated.  Range of motion is slow and guarded secondary to discomfort.   ED Results / Procedures / Treatments   Labs (all labs ordered are listed, but only abnormal results are displayed) Labs  Reviewed - No data to display   RADIOLOGY  Right knee x-ray images reviewed interpreted by myself independent of the radiologist and was negative for acute fracture but degenerative changes were noted.  Official radiology report agrees.  Left wrist x-ray images reviewed and interpreted by myself independent of the radiologist was negative for fracture or dislocation.  Official radiology report also mentions degenerative changes.  Left knee x-ray images were reviewed and interpreted by myself independent of the radiologist is negative for fracture or dislocation.  Official radiology report also mentions degenerative changes.   PROCEDURES:  Critical Care performed:   Procedures   MEDICATIONS ORDERED IN ED: Medications  ibuprofen  (ADVIL ) tablet 600 mg (600 mg Oral Given 04/22/24 1114)     IMPRESSION / MDM / ASSESSMENT AND PLAN / ED COURSE  I reviewed the triage vital signs and the nursing notes.   Differential diagnosis includes, but is not limited to, multiple contusions, fracture, dislocation, sprain, strain, tendon injury secondary to mechanical fall.  73 year old female presents to the ED after mechanical fall  that occurred while she was at work.  Patient denied any head injury or loss of consciousness and the fall was witnessed.  X-rays were reassuring and patient was made aware that there was no fracture or dislocation but that she does have a great deal of arthritis/degenerative changes which she has already been made aware of in the past.  A wrist splint was placed on her left wrist.  Patient requested some ibuprofen  for pain and was offered a narcotic pain medication but she denied stating that the ibuprofen  would be enough.  She is encouraged to use ice and elevate her wrist and that she is going to be more sore tomorrow than she is currently.  Patient is to follow-up with her PCP or return to the emergency department if any worsening of her symptoms or urgent concerns.       Patient's presentation is most consistent with acute illness / injury with system symptoms.  FINAL CLINICAL IMPRESSION(S) / ED DIAGNOSES   Final diagnoses:  Multiple contusions  Fall, initial encounter  Wrist sprain, left, initial encounter     Rx / DC Orders   ED Discharge Orders     None        Note:  This document was prepared using Dragon voice recognition software and may include unintentional dictation errors.   Saunders Shona CROME, PA-C 04/22/24 1334    Jacolyn Pae, MD 04/22/24 1336

## 2024-05-03 ENCOUNTER — Other Ambulatory Visit: Payer: Self-pay | Admitting: Internal Medicine

## 2024-05-03 DIAGNOSIS — Z1231 Encounter for screening mammogram for malignant neoplasm of breast: Secondary | ICD-10-CM

## 2024-05-24 ENCOUNTER — Ambulatory Visit: Admitting: Dermatology

## 2024-05-26 ENCOUNTER — Encounter: Payer: Self-pay | Admitting: Dermatology

## 2024-05-26 ENCOUNTER — Ambulatory Visit (INDEPENDENT_AMBULATORY_CARE_PROVIDER_SITE_OTHER): Payer: Self-pay | Admitting: Dermatology

## 2024-05-26 DIAGNOSIS — L988 Other specified disorders of the skin and subcutaneous tissue: Secondary | ICD-10-CM

## 2024-05-26 NOTE — Patient Instructions (Signed)

## 2024-05-26 NOTE — Progress Notes (Signed)
" ° °  Follow-Up Visit   Subjective  [name] is a 74 y.o. female who presents for the following: filler for facial elastosis  The following portions of the chart were reviewed this encounter and updated as appropriate: medications, allergies, medical history  Review of Systems:  No other skin or systemic complaints except as noted in HPI or Assessment and Plan.  Objective  Well appearing patient in no apparent distress; mood and affect are within normal limits.  A focused examination was performed of the face. Relevant physical exam findings are noted in the Assessment and Plan or shown in photos.  Before photos             After photos             Injection map photo     Assessment & Plan    Facial Elastosis  (honoring 20% off filler Jan/Feb 2026 that was offered to [name] employees by email from [name])  $725 Refyne regular price/syringe, today's price $580. Prior to the procedure, the patient's past medical history, allergies and the rare but potential risks and complications were reviewed with the patient and a signed consent was obtained. Pre and post-treatment care was discussed and instructions provided.   Location: marionette lines and chin, oral commissures  Filler Type: Restylane refyne  Procedure: The area was prepped thoroughly with Puracyn. After introducing the needle into the desired treatment area, the syringe plunger was drawn back to ensure there was no flash of blood prior to injecting the filler in order to minimize risk of intravascular injection and vascular occlusion. After injection of the filler, the treated areas were cleansed and iced to reduce swelling. Post-treatment instructions were reviewed with the patient.       Patient tolerated the procedure well. The patient will call with any problems, questions or concerns prior to their next appointment.  Facial Elastosis Botox 37.5 units injected today to: -  Frown complex 27.5 units - DAO's 2 units x 2, superficial injections - Upper lip 6 units Location: frown complex, upper lip, DAOs  Informed consent: Discussed risks (infection, pain, bleeding, bruising, swelling, allergic reaction, paralysis of nearby muscles, eyelid droop, double vision, neck weakness, difficulty breathing, headache, undesirable cosmetic result, and need for additional treatment) and benefits of the procedure, as well as the alternatives.  Informed consent was obtained.  Preparation: The area was cleansed with alcohol.  Procedure Details:  Botox was injected into the dermis with a 30-gauge needle. Pressure applied to any bleeding. Ice packs offered for swelling.  Lot Number:  [id number] Expiration:  04/2026  Total Units Injected:  37.5  Plan: Tylenol  may be used for headache.  Allow 2 weeks before returning to clinic for additional dosing as needed. Patient will call for any problems.   Return for 4-6 wks for Botox lip flip.  I, Grayce Saunas, RMA, am acting as scribe for Rexene Rattler, MD .   Documentation: I have reviewed the above documentation for accuracy and completeness, and I agree with the above.  Rexene Rattler, MD      "

## 2024-06-16 ENCOUNTER — Encounter

## 2024-06-28 ENCOUNTER — Ambulatory Visit: Admitting: Dermatology
